# Patient Record
Sex: Female | Born: 1935
Health system: Southern US, Community
[De-identification: ages and names within clinical notes are randomized; demographics above are authoritative.]

## PROBLEM LIST (undated history)

## (undated) DIAGNOSIS — T7840XA Allergy, unspecified, initial encounter: Secondary | ICD-10-CM

## (undated) DIAGNOSIS — R109 Unspecified abdominal pain: Secondary | ICD-10-CM

## (undated) DIAGNOSIS — R0600 Dyspnea, unspecified: Secondary | ICD-10-CM

## (undated) DIAGNOSIS — J019 Acute sinusitis, unspecified: Secondary | ICD-10-CM

## (undated) DIAGNOSIS — C4491 Basal cell carcinoma of skin, unspecified: Secondary | ICD-10-CM

## (undated) DIAGNOSIS — M1990 Unspecified osteoarthritis, unspecified site: Secondary | ICD-10-CM

## (undated) DIAGNOSIS — I499 Cardiac arrhythmia, unspecified: Secondary | ICD-10-CM

## (undated) DIAGNOSIS — J45909 Unspecified asthma, uncomplicated: Secondary | ICD-10-CM

## (undated) DIAGNOSIS — I509 Heart failure, unspecified: Secondary | ICD-10-CM

## (undated) DIAGNOSIS — D689 Coagulation defect, unspecified: Secondary | ICD-10-CM

## (undated) DIAGNOSIS — R0609 Other forms of dyspnea: Secondary | ICD-10-CM

## (undated) DIAGNOSIS — L570 Actinic keratosis: Secondary | ICD-10-CM

## (undated) DIAGNOSIS — G8929 Other chronic pain: Secondary | ICD-10-CM

## (undated) DIAGNOSIS — J449 Chronic obstructive pulmonary disease, unspecified: Secondary | ICD-10-CM

## (undated) DIAGNOSIS — R002 Palpitations: Secondary | ICD-10-CM

## (undated) DIAGNOSIS — D649 Anemia, unspecified: Secondary | ICD-10-CM

## (undated) DIAGNOSIS — J00 Acute nasopharyngitis [common cold]: Secondary | ICD-10-CM

## (undated) DIAGNOSIS — M47816 Spondylosis without myelopathy or radiculopathy, lumbar region: Secondary | ICD-10-CM

## (undated) DIAGNOSIS — M810 Age-related osteoporosis without current pathological fracture: Secondary | ICD-10-CM

## (undated) DIAGNOSIS — J439 Emphysema, unspecified: Secondary | ICD-10-CM

## (undated) DIAGNOSIS — M546 Pain in thoracic spine: Secondary | ICD-10-CM

## (undated) DIAGNOSIS — G709 Myoneural disorder, unspecified: Secondary | ICD-10-CM

## (undated) DIAGNOSIS — E079 Disorder of thyroid, unspecified: Secondary | ICD-10-CM

## (undated) DIAGNOSIS — R079 Chest pain, unspecified: Secondary | ICD-10-CM

## (undated) DIAGNOSIS — J399 Disease of upper respiratory tract, unspecified: Secondary | ICD-10-CM

## (undated) DIAGNOSIS — K219 Gastro-esophageal reflux disease without esophagitis: Secondary | ICD-10-CM

## (undated) DIAGNOSIS — M7989 Other specified soft tissue disorders: Secondary | ICD-10-CM

## (undated) DIAGNOSIS — Z9289 Personal history of other medical treatment: Secondary | ICD-10-CM

## (undated) HISTORY — DX: Chest pain, unspecified: R07.9

## (undated) HISTORY — PX: COLON SURGERY: SHX602

## (undated) HISTORY — PX: LAPAROSCOPIC COLON RESECTION: SUR791

## (undated) HISTORY — DX: Heart failure, unspecified: I50.9

## (undated) HISTORY — DX: Disease of upper respiratory tract, unspecified: J39.9

## (undated) HISTORY — DX: Acute sinusitis, unspecified: J01.90

## (undated) HISTORY — DX: Unspecified osteoarthritis, unspecified site: M19.90

## (undated) HISTORY — DX: Gastro-esophageal reflux disease without esophagitis: K21.9

## (undated) HISTORY — DX: Emphysema, unspecified: J43.9

## (undated) HISTORY — PX: HERNIA REPAIR: SHX51

## (undated) HISTORY — PX: CHOLECYSTECTOMY: SHX55

## (undated) HISTORY — PX: CATARACT EXTRACTION: SUR2

## (undated) HISTORY — DX: Age-related osteoporosis without current pathological fracture: M81.0

## (undated) HISTORY — DX: Other forms of dyspnea: R06.09

## (undated) HISTORY — PX: EYE SURGERY: SHX253

## (undated) HISTORY — DX: Unspecified abdominal pain: R10.9

## (undated) HISTORY — PX: COLONOSCOPY: SHX174

## (undated) HISTORY — PX: ESOPHAGOGASTRODUODENOSCOPY: SHX1529

## (undated) HISTORY — DX: Pain in thoracic spine: M54.6

## (undated) HISTORY — DX: Allergy, unspecified, initial encounter: T78.40XA

## (undated) HISTORY — DX: Acute nasopharyngitis (common cold): J00

## (undated) HISTORY — DX: Other specified soft tissue disorders: M79.89

## (undated) HISTORY — DX: Personal history of other medical treatment: Z92.89

## (undated) HISTORY — DX: Spondylosis without myelopathy or radiculopathy, lumbar region: M47.816

## (undated) HISTORY — DX: Actinic keratosis: L57.0

## (undated) HISTORY — DX: Coagulation defect, unspecified: D68.9

## (undated) HISTORY — DX: Disorder of thyroid, unspecified: E07.9

## (undated) HISTORY — DX: Cardiac arrhythmia, unspecified: I49.9

## (undated) HISTORY — DX: Anemia, unspecified: D64.9

## (undated) HISTORY — PX: JOINT REPLACEMENT: SHX530

## (undated) HISTORY — PX: FRACTURE SURGERY: SHX138

## (undated) HISTORY — DX: Myoneural disorder, unspecified: G70.9

## (undated) HISTORY — DX: Other chronic pain: G89.29

## (undated) HISTORY — DX: Dyspnea, unspecified: R06.00

## (undated) HISTORY — DX: Basal cell carcinoma of skin, unspecified: C44.91

---

## 1999-11-28 HISTORY — PX: FLEXIBLE SIGMOIDOSCOPY: SHX1649

## 2004-06-28 DIAGNOSIS — Z9289 Personal history of other medical treatment: Secondary | ICD-10-CM

## 2004-06-28 HISTORY — DX: Personal history of other medical treatment: Z92.89

## 2004-07-21 HISTORY — PX: HAND SURGERY: SHX662

## 2004-08-08 ENCOUNTER — Ambulatory Visit: Payer: Self-pay

## 2004-12-23 ENCOUNTER — Ambulatory Visit: Payer: Self-pay | Admitting: Unknown Physician Specialty

## 2005-10-28 ENCOUNTER — Ambulatory Visit: Payer: Self-pay | Admitting: Unknown Physician Specialty

## 2008-11-22 ENCOUNTER — Ambulatory Visit: Payer: Self-pay | Admitting: Surgery

## 2009-01-06 ENCOUNTER — Inpatient Hospital Stay: Payer: Self-pay | Admitting: Surgery

## 2009-04-10 ENCOUNTER — Ambulatory Visit: Payer: Self-pay | Admitting: Internal Medicine

## 2009-08-27 ENCOUNTER — Ambulatory Visit: Payer: Self-pay | Admitting: Internal Medicine

## 2009-11-05 ENCOUNTER — Ambulatory Visit: Payer: Self-pay | Admitting: Unknown Physician Specialty

## 2009-11-20 ENCOUNTER — Ambulatory Visit: Payer: Self-pay | Admitting: Unknown Physician Specialty

## 2011-12-11 DIAGNOSIS — J398 Other specified diseases of upper respiratory tract: Secondary | ICD-10-CM | POA: Diagnosis not present

## 2011-12-11 DIAGNOSIS — J45902 Unspecified asthma with status asthmaticus: Secondary | ICD-10-CM | POA: Diagnosis not present

## 2011-12-18 DIAGNOSIS — R42 Dizziness and giddiness: Secondary | ICD-10-CM | POA: Diagnosis not present

## 2011-12-18 DIAGNOSIS — E785 Hyperlipidemia, unspecified: Secondary | ICD-10-CM | POA: Diagnosis not present

## 2011-12-18 DIAGNOSIS — I10 Essential (primary) hypertension: Secondary | ICD-10-CM | POA: Diagnosis not present

## 2011-12-18 DIAGNOSIS — N39498 Other specified urinary incontinence: Secondary | ICD-10-CM | POA: Diagnosis not present

## 2012-02-04 DIAGNOSIS — N39498 Other specified urinary incontinence: Secondary | ICD-10-CM | POA: Diagnosis not present

## 2012-02-04 DIAGNOSIS — E039 Hypothyroidism, unspecified: Secondary | ICD-10-CM | POA: Diagnosis not present

## 2012-02-04 DIAGNOSIS — I10 Essential (primary) hypertension: Secondary | ICD-10-CM | POA: Diagnosis not present

## 2012-02-04 DIAGNOSIS — D649 Anemia, unspecified: Secondary | ICD-10-CM | POA: Diagnosis not present

## 2012-02-04 DIAGNOSIS — E7800 Pure hypercholesterolemia, unspecified: Secondary | ICD-10-CM | POA: Diagnosis not present

## 2012-02-04 DIAGNOSIS — E785 Hyperlipidemia, unspecified: Secondary | ICD-10-CM | POA: Diagnosis not present

## 2012-02-04 DIAGNOSIS — R42 Dizziness and giddiness: Secondary | ICD-10-CM | POA: Diagnosis not present

## 2012-02-11 DIAGNOSIS — R011 Cardiac murmur, unspecified: Secondary | ICD-10-CM | POA: Diagnosis not present

## 2012-03-18 DIAGNOSIS — K219 Gastro-esophageal reflux disease without esophagitis: Secondary | ICD-10-CM | POA: Diagnosis not present

## 2012-03-18 DIAGNOSIS — R498 Other voice and resonance disorders: Secondary | ICD-10-CM | POA: Diagnosis not present

## 2012-03-18 DIAGNOSIS — F449 Dissociative and conversion disorder, unspecified: Secondary | ICD-10-CM | POA: Diagnosis not present

## 2012-03-18 DIAGNOSIS — J45909 Unspecified asthma, uncomplicated: Secondary | ICD-10-CM | POA: Diagnosis not present

## 2012-03-18 DIAGNOSIS — J309 Allergic rhinitis, unspecified: Secondary | ICD-10-CM | POA: Diagnosis not present

## 2012-04-29 DIAGNOSIS — F449 Dissociative and conversion disorder, unspecified: Secondary | ICD-10-CM | POA: Diagnosis not present

## 2012-04-29 DIAGNOSIS — J45909 Unspecified asthma, uncomplicated: Secondary | ICD-10-CM | POA: Diagnosis not present

## 2012-04-29 DIAGNOSIS — K219 Gastro-esophageal reflux disease without esophagitis: Secondary | ICD-10-CM | POA: Diagnosis not present

## 2012-04-29 DIAGNOSIS — R498 Other voice and resonance disorders: Secondary | ICD-10-CM | POA: Diagnosis not present

## 2012-05-11 DIAGNOSIS — Z23 Encounter for immunization: Secondary | ICD-10-CM | POA: Diagnosis not present

## 2012-06-07 DIAGNOSIS — E785 Hyperlipidemia, unspecified: Secondary | ICD-10-CM | POA: Diagnosis not present

## 2012-06-07 DIAGNOSIS — K219 Gastro-esophageal reflux disease without esophagitis: Secondary | ICD-10-CM | POA: Diagnosis not present

## 2012-06-07 DIAGNOSIS — N39498 Other specified urinary incontinence: Secondary | ICD-10-CM | POA: Diagnosis not present

## 2012-06-07 DIAGNOSIS — J45902 Unspecified asthma with status asthmaticus: Secondary | ICD-10-CM | POA: Diagnosis not present

## 2012-08-03 DIAGNOSIS — J309 Allergic rhinitis, unspecified: Secondary | ICD-10-CM | POA: Diagnosis not present

## 2012-08-03 DIAGNOSIS — K219 Gastro-esophageal reflux disease without esophagitis: Secondary | ICD-10-CM | POA: Diagnosis not present

## 2012-08-03 DIAGNOSIS — F449 Dissociative and conversion disorder, unspecified: Secondary | ICD-10-CM | POA: Diagnosis not present

## 2012-08-03 DIAGNOSIS — R498 Other voice and resonance disorders: Secondary | ICD-10-CM | POA: Diagnosis not present

## 2012-08-03 DIAGNOSIS — J45909 Unspecified asthma, uncomplicated: Secondary | ICD-10-CM | POA: Diagnosis not present

## 2012-08-09 DIAGNOSIS — K219 Gastro-esophageal reflux disease without esophagitis: Secondary | ICD-10-CM | POA: Diagnosis not present

## 2012-08-09 DIAGNOSIS — I10 Essential (primary) hypertension: Secondary | ICD-10-CM | POA: Diagnosis not present

## 2012-08-09 DIAGNOSIS — E785 Hyperlipidemia, unspecified: Secondary | ICD-10-CM | POA: Diagnosis not present

## 2012-08-09 DIAGNOSIS — J45902 Unspecified asthma with status asthmaticus: Secondary | ICD-10-CM | POA: Diagnosis not present

## 2012-09-07 DIAGNOSIS — R911 Solitary pulmonary nodule: Secondary | ICD-10-CM | POA: Diagnosis not present

## 2012-09-07 DIAGNOSIS — J9819 Other pulmonary collapse: Secondary | ICD-10-CM | POA: Diagnosis not present

## 2012-09-16 DIAGNOSIS — Z1231 Encounter for screening mammogram for malignant neoplasm of breast: Secondary | ICD-10-CM | POA: Diagnosis not present

## 2012-12-06 DIAGNOSIS — J449 Chronic obstructive pulmonary disease, unspecified: Secondary | ICD-10-CM | POA: Diagnosis not present

## 2012-12-06 DIAGNOSIS — E785 Hyperlipidemia, unspecified: Secondary | ICD-10-CM | POA: Diagnosis not present

## 2012-12-06 DIAGNOSIS — K219 Gastro-esophageal reflux disease without esophagitis: Secondary | ICD-10-CM | POA: Diagnosis not present

## 2012-12-06 DIAGNOSIS — N39498 Other specified urinary incontinence: Secondary | ICD-10-CM | POA: Diagnosis not present

## 2013-02-10 DIAGNOSIS — T1490XA Injury, unspecified, initial encounter: Secondary | ICD-10-CM | POA: Diagnosis not present

## 2013-02-10 DIAGNOSIS — X58XXXA Exposure to other specified factors, initial encounter: Secondary | ICD-10-CM | POA: Diagnosis not present

## 2013-03-09 DIAGNOSIS — L02419 Cutaneous abscess of limb, unspecified: Secondary | ICD-10-CM | POA: Diagnosis not present

## 2013-03-09 DIAGNOSIS — Z23 Encounter for immunization: Secondary | ICD-10-CM | POA: Diagnosis not present

## 2013-03-09 DIAGNOSIS — Z789 Other specified health status: Secondary | ICD-10-CM | POA: Diagnosis not present

## 2013-03-14 DIAGNOSIS — J45909 Unspecified asthma, uncomplicated: Secondary | ICD-10-CM | POA: Diagnosis not present

## 2013-03-14 DIAGNOSIS — I10 Essential (primary) hypertension: Secondary | ICD-10-CM | POA: Diagnosis not present

## 2013-03-14 DIAGNOSIS — L0291 Cutaneous abscess, unspecified: Secondary | ICD-10-CM | POA: Diagnosis not present

## 2013-03-14 DIAGNOSIS — K5712 Diverticulitis of small intestine without perforation or abscess without bleeding: Secondary | ICD-10-CM | POA: Diagnosis not present

## 2013-04-06 DIAGNOSIS — T148 Other injury of unspecified body region: Secondary | ICD-10-CM | POA: Diagnosis not present

## 2013-04-06 DIAGNOSIS — Z91038 Other insect allergy status: Secondary | ICD-10-CM | POA: Diagnosis not present

## 2013-04-08 DIAGNOSIS — T148 Other injury of unspecified body region: Secondary | ICD-10-CM | POA: Diagnosis not present

## 2013-04-11 DIAGNOSIS — T148 Other injury of unspecified body region: Secondary | ICD-10-CM | POA: Diagnosis not present

## 2013-04-11 DIAGNOSIS — Z23 Encounter for immunization: Secondary | ICD-10-CM | POA: Diagnosis not present

## 2013-06-09 DIAGNOSIS — L905 Scar conditions and fibrosis of skin: Secondary | ICD-10-CM | POA: Diagnosis not present

## 2013-06-09 DIAGNOSIS — L820 Inflamed seborrheic keratosis: Secondary | ICD-10-CM | POA: Diagnosis not present

## 2013-06-09 DIAGNOSIS — L570 Actinic keratosis: Secondary | ICD-10-CM | POA: Diagnosis not present

## 2013-06-09 DIAGNOSIS — L578 Other skin changes due to chronic exposure to nonionizing radiation: Secondary | ICD-10-CM | POA: Diagnosis not present

## 2013-06-09 DIAGNOSIS — Z85828 Personal history of other malignant neoplasm of skin: Secondary | ICD-10-CM | POA: Diagnosis not present

## 2013-06-09 DIAGNOSIS — L821 Other seborrheic keratosis: Secondary | ICD-10-CM | POA: Diagnosis not present

## 2013-06-13 DIAGNOSIS — I10 Essential (primary) hypertension: Secondary | ICD-10-CM | POA: Diagnosis not present

## 2013-06-13 DIAGNOSIS — J45909 Unspecified asthma, uncomplicated: Secondary | ICD-10-CM | POA: Diagnosis not present

## 2013-06-13 DIAGNOSIS — Z1331 Encounter for screening for depression: Secondary | ICD-10-CM | POA: Diagnosis not present

## 2013-06-13 DIAGNOSIS — E785 Hyperlipidemia, unspecified: Secondary | ICD-10-CM | POA: Diagnosis not present

## 2013-09-26 ENCOUNTER — Ambulatory Visit: Payer: Self-pay | Admitting: Internal Medicine

## 2013-09-26 DIAGNOSIS — M47814 Spondylosis without myelopathy or radiculopathy, thoracic region: Secondary | ICD-10-CM | POA: Diagnosis not present

## 2013-09-26 DIAGNOSIS — K2100 Gastro-esophageal reflux disease with esophagitis, without bleeding: Secondary | ICD-10-CM | POA: Diagnosis not present

## 2013-09-26 DIAGNOSIS — M8448XA Pathological fracture, other site, initial encounter for fracture: Secondary | ICD-10-CM | POA: Diagnosis not present

## 2013-09-26 DIAGNOSIS — I10 Essential (primary) hypertension: Secondary | ICD-10-CM | POA: Diagnosis not present

## 2013-09-26 DIAGNOSIS — IMO0002 Reserved for concepts with insufficient information to code with codable children: Secondary | ICD-10-CM | POA: Diagnosis not present

## 2013-09-28 DIAGNOSIS — M539 Dorsopathy, unspecified: Secondary | ICD-10-CM | POA: Diagnosis not present

## 2013-09-28 DIAGNOSIS — IMO0002 Reserved for concepts with insufficient information to code with codable children: Secondary | ICD-10-CM | POA: Diagnosis not present

## 2013-12-29 DIAGNOSIS — Z789 Other specified health status: Secondary | ICD-10-CM | POA: Diagnosis not present

## 2013-12-29 DIAGNOSIS — K5732 Diverticulitis of large intestine without perforation or abscess without bleeding: Secondary | ICD-10-CM | POA: Diagnosis not present

## 2013-12-29 DIAGNOSIS — K2100 Gastro-esophageal reflux disease with esophagitis, without bleeding: Secondary | ICD-10-CM | POA: Diagnosis not present

## 2013-12-29 DIAGNOSIS — I10 Essential (primary) hypertension: Secondary | ICD-10-CM | POA: Diagnosis not present

## 2014-02-08 DIAGNOSIS — Z961 Presence of intraocular lens: Secondary | ICD-10-CM | POA: Diagnosis not present

## 2014-04-07 DIAGNOSIS — E039 Hypothyroidism, unspecified: Secondary | ICD-10-CM | POA: Diagnosis not present

## 2014-04-07 DIAGNOSIS — D649 Anemia, unspecified: Secondary | ICD-10-CM | POA: Diagnosis not present

## 2014-04-07 DIAGNOSIS — E785 Hyperlipidemia, unspecified: Secondary | ICD-10-CM | POA: Diagnosis not present

## 2014-04-14 DIAGNOSIS — K5732 Diverticulitis of large intestine without perforation or abscess without bleeding: Secondary | ICD-10-CM | POA: Diagnosis not present

## 2014-04-14 DIAGNOSIS — K2100 Gastro-esophageal reflux disease with esophagitis, without bleeding: Secondary | ICD-10-CM | POA: Diagnosis not present

## 2014-04-14 DIAGNOSIS — K5712 Diverticulitis of small intestine without perforation or abscess without bleeding: Secondary | ICD-10-CM | POA: Diagnosis not present

## 2014-04-14 DIAGNOSIS — E785 Hyperlipidemia, unspecified: Secondary | ICD-10-CM | POA: Diagnosis not present

## 2014-04-18 DIAGNOSIS — Z23 Encounter for immunization: Secondary | ICD-10-CM | POA: Diagnosis not present

## 2014-05-09 DIAGNOSIS — H40003 Preglaucoma, unspecified, bilateral: Secondary | ICD-10-CM | POA: Diagnosis not present

## 2014-06-08 DIAGNOSIS — H1851 Endothelial corneal dystrophy: Secondary | ICD-10-CM | POA: Diagnosis not present

## 2014-06-19 DIAGNOSIS — L578 Other skin changes due to chronic exposure to nonionizing radiation: Secondary | ICD-10-CM | POA: Diagnosis not present

## 2014-06-19 DIAGNOSIS — Z85828 Personal history of other malignant neoplasm of skin: Secondary | ICD-10-CM | POA: Diagnosis not present

## 2014-06-19 DIAGNOSIS — Z1283 Encounter for screening for malignant neoplasm of skin: Secondary | ICD-10-CM | POA: Diagnosis not present

## 2014-06-19 DIAGNOSIS — L814 Other melanin hyperpigmentation: Secondary | ICD-10-CM | POA: Diagnosis not present

## 2014-06-19 DIAGNOSIS — D229 Melanocytic nevi, unspecified: Secondary | ICD-10-CM | POA: Diagnosis not present

## 2014-06-19 DIAGNOSIS — L820 Inflamed seborrheic keratosis: Secondary | ICD-10-CM | POA: Diagnosis not present

## 2014-06-19 DIAGNOSIS — L821 Other seborrheic keratosis: Secondary | ICD-10-CM | POA: Diagnosis not present

## 2014-06-19 DIAGNOSIS — I8393 Asymptomatic varicose veins of bilateral lower extremities: Secondary | ICD-10-CM | POA: Diagnosis not present

## 2014-07-07 DIAGNOSIS — K5712 Diverticulitis of small intestine without perforation or abscess without bleeding: Secondary | ICD-10-CM | POA: Diagnosis not present

## 2014-07-07 DIAGNOSIS — K210 Gastro-esophageal reflux disease with esophagitis: Secondary | ICD-10-CM | POA: Diagnosis not present

## 2014-07-07 DIAGNOSIS — J45909 Unspecified asthma, uncomplicated: Secondary | ICD-10-CM | POA: Diagnosis not present

## 2014-07-07 DIAGNOSIS — I10 Essential (primary) hypertension: Secondary | ICD-10-CM | POA: Diagnosis not present

## 2014-08-24 DIAGNOSIS — J00 Acute nasopharyngitis [common cold]: Secondary | ICD-10-CM | POA: Diagnosis not present

## 2014-08-24 DIAGNOSIS — R05 Cough: Secondary | ICD-10-CM | POA: Diagnosis not present

## 2014-09-01 DIAGNOSIS — K5712 Diverticulitis of small intestine without perforation or abscess without bleeding: Secondary | ICD-10-CM | POA: Diagnosis not present

## 2014-09-01 DIAGNOSIS — J00 Acute nasopharyngitis [common cold]: Secondary | ICD-10-CM | POA: Diagnosis not present

## 2014-09-01 DIAGNOSIS — K210 Gastro-esophageal reflux disease with esophagitis: Secondary | ICD-10-CM | POA: Diagnosis not present

## 2014-09-01 DIAGNOSIS — I10 Essential (primary) hypertension: Secondary | ICD-10-CM | POA: Diagnosis not present

## 2014-09-06 DIAGNOSIS — H1851 Endothelial corneal dystrophy: Secondary | ICD-10-CM | POA: Diagnosis not present

## 2014-11-30 DIAGNOSIS — K5712 Diverticulitis of small intestine without perforation or abscess without bleeding: Secondary | ICD-10-CM | POA: Diagnosis not present

## 2014-11-30 DIAGNOSIS — M549 Dorsalgia, unspecified: Secondary | ICD-10-CM | POA: Diagnosis not present

## 2014-11-30 DIAGNOSIS — I10 Essential (primary) hypertension: Secondary | ICD-10-CM | POA: Diagnosis not present

## 2014-11-30 DIAGNOSIS — K219 Gastro-esophageal reflux disease without esophagitis: Secondary | ICD-10-CM | POA: Diagnosis not present

## 2015-01-01 DIAGNOSIS — J45902 Unspecified asthma with status asthmaticus: Secondary | ICD-10-CM | POA: Diagnosis not present

## 2015-01-01 DIAGNOSIS — I10 Essential (primary) hypertension: Secondary | ICD-10-CM | POA: Diagnosis not present

## 2015-01-01 DIAGNOSIS — M549 Dorsalgia, unspecified: Secondary | ICD-10-CM | POA: Diagnosis not present

## 2015-01-01 DIAGNOSIS — K210 Gastro-esophageal reflux disease with esophagitis: Secondary | ICD-10-CM | POA: Diagnosis not present

## 2015-04-06 DIAGNOSIS — M549 Dorsalgia, unspecified: Secondary | ICD-10-CM | POA: Diagnosis not present

## 2015-04-06 DIAGNOSIS — M545 Low back pain: Secondary | ICD-10-CM | POA: Diagnosis not present

## 2015-04-09 DIAGNOSIS — Z23 Encounter for immunization: Secondary | ICD-10-CM | POA: Diagnosis not present

## 2015-04-10 DIAGNOSIS — I10 Essential (primary) hypertension: Secondary | ICD-10-CM | POA: Diagnosis not present

## 2015-04-10 DIAGNOSIS — E784 Other hyperlipidemia: Secondary | ICD-10-CM | POA: Diagnosis not present

## 2015-04-17 DIAGNOSIS — R0981 Nasal congestion: Secondary | ICD-10-CM | POA: Diagnosis not present

## 2015-04-17 DIAGNOSIS — R0982 Postnasal drip: Secondary | ICD-10-CM | POA: Diagnosis not present

## 2015-04-18 DIAGNOSIS — Z961 Presence of intraocular lens: Secondary | ICD-10-CM | POA: Diagnosis not present

## 2015-04-20 DIAGNOSIS — R32 Unspecified urinary incontinence: Secondary | ICD-10-CM | POA: Diagnosis not present

## 2015-04-20 DIAGNOSIS — I10 Essential (primary) hypertension: Secondary | ICD-10-CM | POA: Diagnosis not present

## 2015-04-20 DIAGNOSIS — K210 Gastro-esophageal reflux disease with esophagitis: Secondary | ICD-10-CM | POA: Diagnosis not present

## 2015-04-20 DIAGNOSIS — Z0000 Encounter for general adult medical examination without abnormal findings: Secondary | ICD-10-CM | POA: Diagnosis not present

## 2015-05-15 DIAGNOSIS — M85862 Other specified disorders of bone density and structure, left lower leg: Secondary | ICD-10-CM | POA: Diagnosis not present

## 2015-05-15 DIAGNOSIS — Z1231 Encounter for screening mammogram for malignant neoplasm of breast: Secondary | ICD-10-CM | POA: Diagnosis not present

## 2015-05-15 DIAGNOSIS — Z13820 Encounter for screening for osteoporosis: Secondary | ICD-10-CM | POA: Diagnosis not present

## 2015-05-15 DIAGNOSIS — E2839 Other primary ovarian failure: Secondary | ICD-10-CM | POA: Diagnosis not present

## 2015-06-20 DIAGNOSIS — Z1283 Encounter for screening for malignant neoplasm of skin: Secondary | ICD-10-CM | POA: Diagnosis not present

## 2015-06-20 DIAGNOSIS — L821 Other seborrheic keratosis: Secondary | ICD-10-CM | POA: Diagnosis not present

## 2015-06-20 DIAGNOSIS — L570 Actinic keratosis: Secondary | ICD-10-CM | POA: Diagnosis not present

## 2015-06-20 DIAGNOSIS — D1800 Hemangioma unspecified site: Secondary | ICD-10-CM | POA: Diagnosis not present

## 2015-06-20 DIAGNOSIS — D692 Other nonthrombocytopenic purpura: Secondary | ICD-10-CM | POA: Diagnosis not present

## 2015-06-20 DIAGNOSIS — I8393 Asymptomatic varicose veins of bilateral lower extremities: Secondary | ICD-10-CM | POA: Diagnosis not present

## 2015-06-20 DIAGNOSIS — L718 Other rosacea: Secondary | ICD-10-CM | POA: Diagnosis not present

## 2015-06-20 DIAGNOSIS — L578 Other skin changes due to chronic exposure to nonionizing radiation: Secondary | ICD-10-CM | POA: Diagnosis not present

## 2015-06-20 DIAGNOSIS — I781 Nevus, non-neoplastic: Secondary | ICD-10-CM | POA: Diagnosis not present

## 2015-06-20 DIAGNOSIS — D229 Melanocytic nevi, unspecified: Secondary | ICD-10-CM | POA: Diagnosis not present

## 2015-06-20 DIAGNOSIS — T07 Unspecified multiple injuries: Secondary | ICD-10-CM | POA: Diagnosis not present

## 2015-06-20 DIAGNOSIS — Z85828 Personal history of other malignant neoplasm of skin: Secondary | ICD-10-CM | POA: Diagnosis not present

## 2015-07-25 DIAGNOSIS — M810 Age-related osteoporosis without current pathological fracture: Secondary | ICD-10-CM | POA: Diagnosis not present

## 2015-09-13 DIAGNOSIS — J449 Chronic obstructive pulmonary disease, unspecified: Secondary | ICD-10-CM | POA: Diagnosis not present

## 2015-10-11 DIAGNOSIS — J449 Chronic obstructive pulmonary disease, unspecified: Secondary | ICD-10-CM | POA: Diagnosis not present

## 2015-10-16 DIAGNOSIS — H1851 Endothelial corneal dystrophy: Secondary | ICD-10-CM | POA: Diagnosis not present

## 2015-10-18 IMAGING — CR DG THORACIC SPINE 2-3V
1 series · 3 of 3 positions shown · non-contrast
Comparison: None.

CLINICAL DATA: Back pain

EXAM:
THORACIC SPINE - 2 VIEW

[Series 1: ap · 0.17mm/px · 3 of 3 slices shown]
[im 1/3]
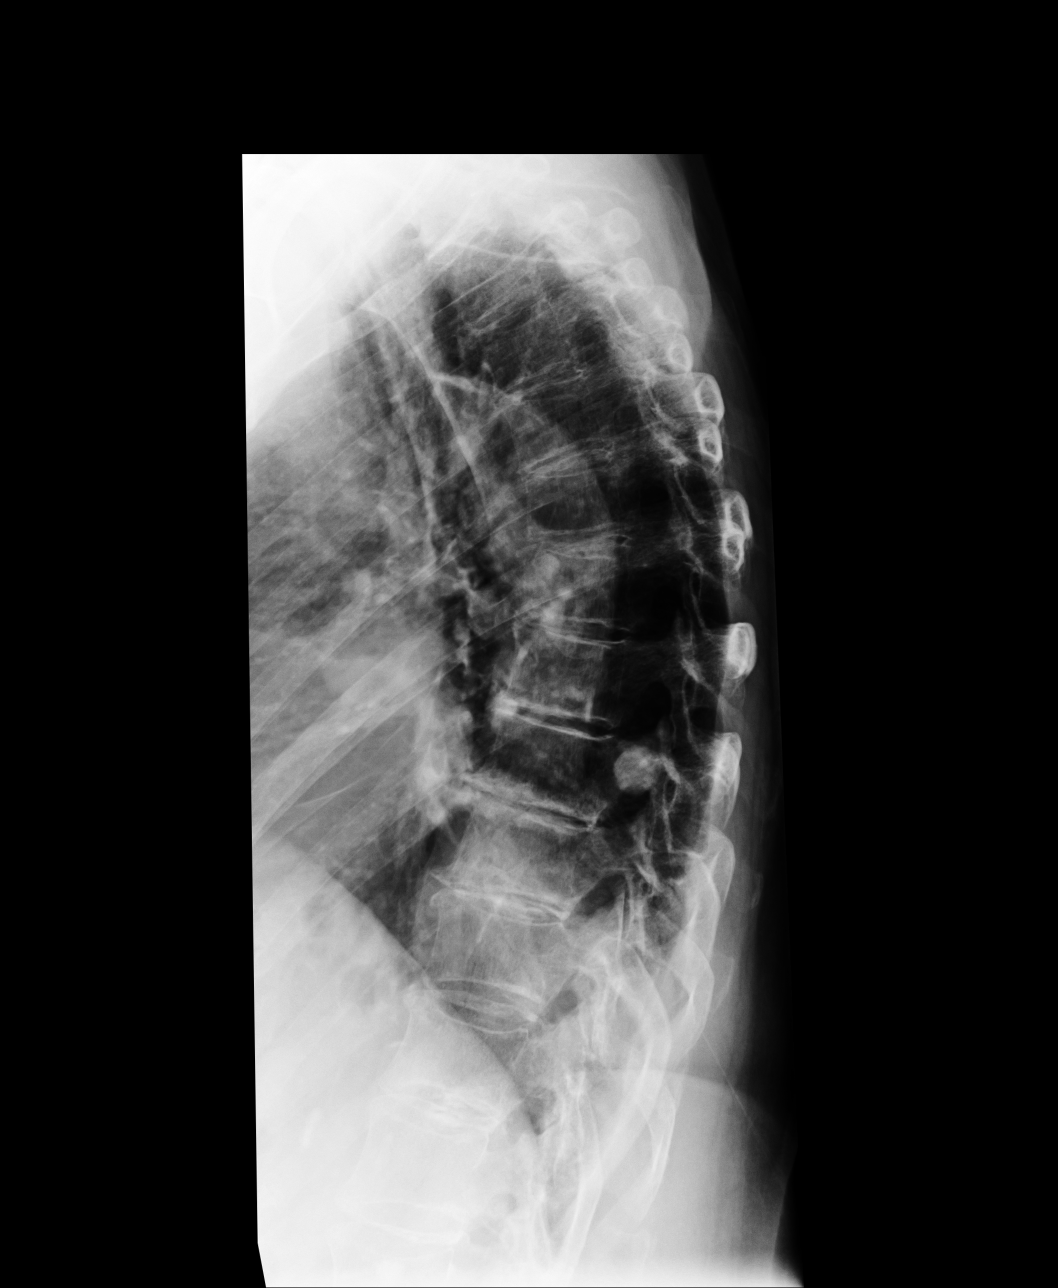
[im 2/3]
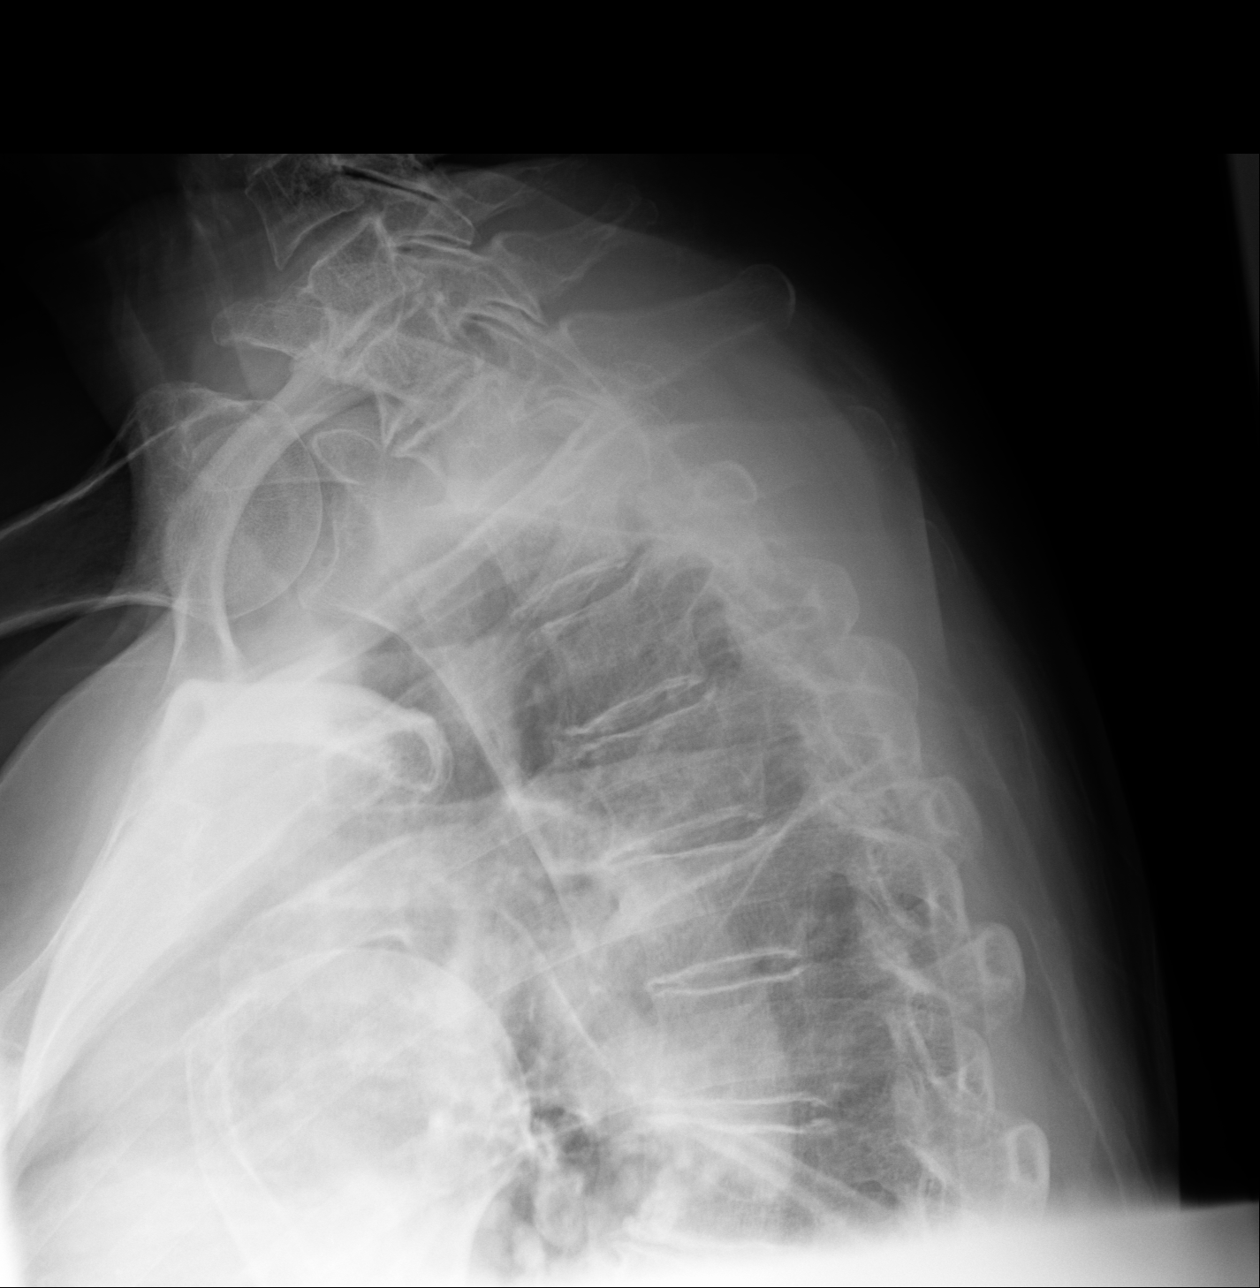
[im 3/3]
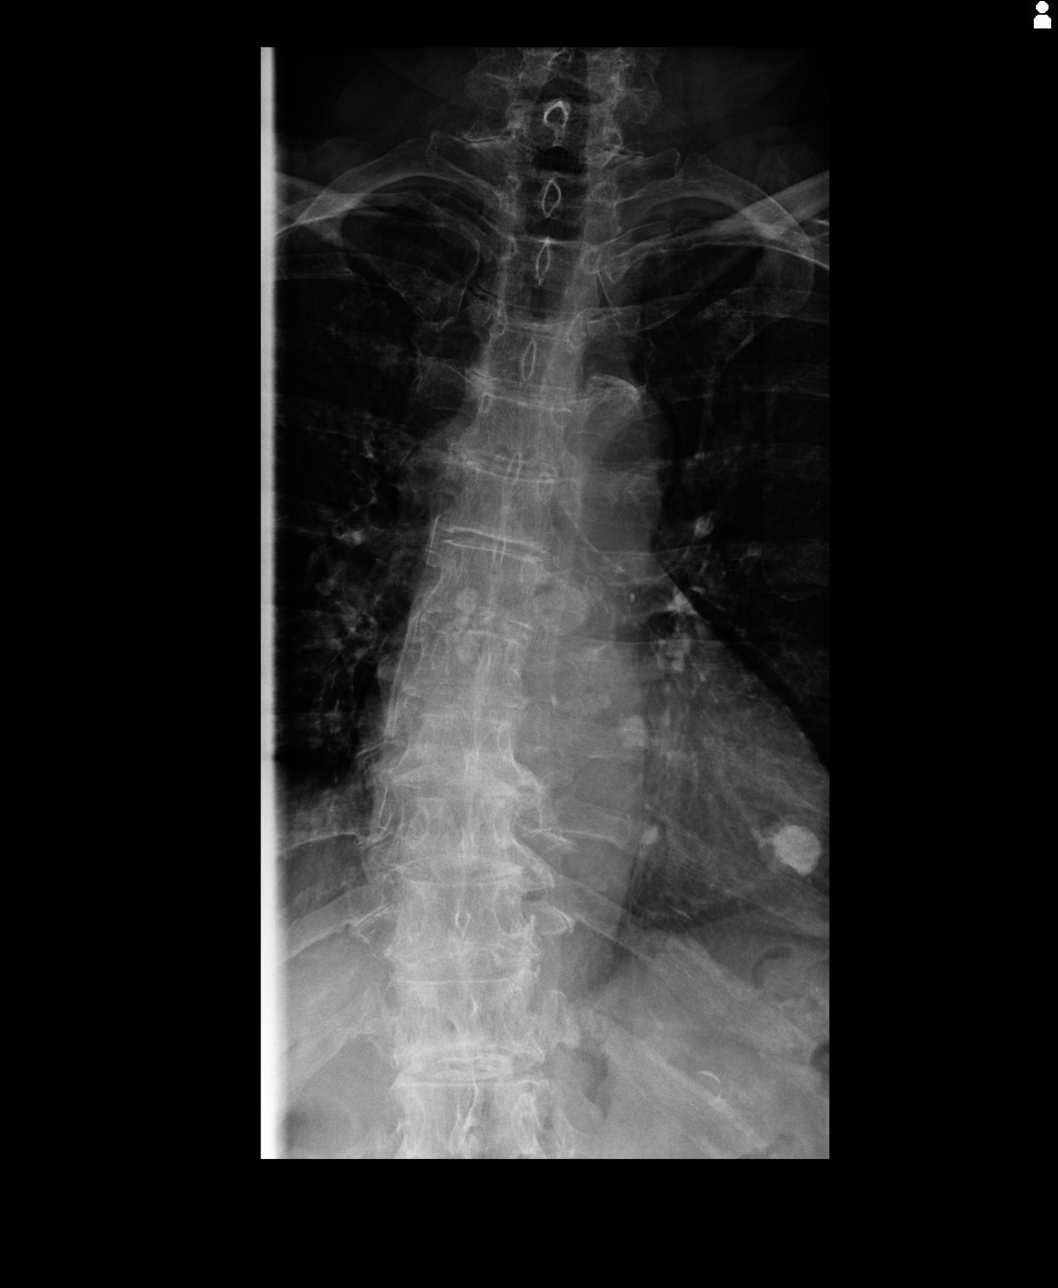

[3 of 3 positions shown; findings below may reference images not displayed]

FINDINGS: Degenerative changes are noted. No compression deformities are seen.
Multiple calcified granulomas are noted.
IMPRESSION: Degenerative change without acute abnormality.

## 2015-10-26 DIAGNOSIS — M810 Age-related osteoporosis without current pathological fracture: Secondary | ICD-10-CM | POA: Diagnosis not present

## 2015-10-26 DIAGNOSIS — K210 Gastro-esophageal reflux disease with esophagitis: Secondary | ICD-10-CM | POA: Diagnosis not present

## 2015-10-26 DIAGNOSIS — M4696 Unspecified inflammatory spondylopathy, lumbar region: Secondary | ICD-10-CM | POA: Diagnosis not present

## 2015-10-26 DIAGNOSIS — I10 Essential (primary) hypertension: Secondary | ICD-10-CM | POA: Diagnosis not present

## 2015-11-11 DIAGNOSIS — J449 Chronic obstructive pulmonary disease, unspecified: Secondary | ICD-10-CM | POA: Diagnosis not present

## 2015-12-11 DIAGNOSIS — J449 Chronic obstructive pulmonary disease, unspecified: Secondary | ICD-10-CM | POA: Diagnosis not present

## 2016-01-11 DIAGNOSIS — J449 Chronic obstructive pulmonary disease, unspecified: Secondary | ICD-10-CM | POA: Diagnosis not present

## 2016-01-18 DIAGNOSIS — I10 Essential (primary) hypertension: Secondary | ICD-10-CM | POA: Diagnosis not present

## 2016-01-18 DIAGNOSIS — M810 Age-related osteoporosis without current pathological fracture: Secondary | ICD-10-CM | POA: Diagnosis not present

## 2016-01-18 DIAGNOSIS — M4696 Unspecified inflammatory spondylopathy, lumbar region: Secondary | ICD-10-CM | POA: Diagnosis not present

## 2016-01-18 DIAGNOSIS — K210 Gastro-esophageal reflux disease with esophagitis: Secondary | ICD-10-CM | POA: Diagnosis not present

## 2016-02-10 DIAGNOSIS — J449 Chronic obstructive pulmonary disease, unspecified: Secondary | ICD-10-CM | POA: Diagnosis not present

## 2016-03-12 DIAGNOSIS — J449 Chronic obstructive pulmonary disease, unspecified: Secondary | ICD-10-CM | POA: Diagnosis not present

## 2016-04-15 DIAGNOSIS — H1851 Endothelial corneal dystrophy: Secondary | ICD-10-CM | POA: Diagnosis not present

## 2016-04-18 DIAGNOSIS — M4696 Unspecified inflammatory spondylopathy, lumbar region: Secondary | ICD-10-CM | POA: Diagnosis not present

## 2016-04-18 DIAGNOSIS — K210 Gastro-esophageal reflux disease with esophagitis: Secondary | ICD-10-CM | POA: Diagnosis not present

## 2016-04-18 DIAGNOSIS — Z23 Encounter for immunization: Secondary | ICD-10-CM | POA: Diagnosis not present

## 2016-04-18 DIAGNOSIS — I10 Essential (primary) hypertension: Secondary | ICD-10-CM | POA: Diagnosis not present

## 2016-04-18 DIAGNOSIS — K5732 Diverticulitis of large intestine without perforation or abscess without bleeding: Secondary | ICD-10-CM | POA: Diagnosis not present

## 2016-05-16 DIAGNOSIS — K5732 Diverticulitis of large intestine without perforation or abscess without bleeding: Secondary | ICD-10-CM | POA: Diagnosis not present

## 2016-05-16 DIAGNOSIS — I10 Essential (primary) hypertension: Secondary | ICD-10-CM | POA: Diagnosis not present

## 2016-05-16 DIAGNOSIS — M4696 Unspecified inflammatory spondylopathy, lumbar region: Secondary | ICD-10-CM | POA: Diagnosis not present

## 2016-05-16 DIAGNOSIS — M1990 Unspecified osteoarthritis, unspecified site: Secondary | ICD-10-CM | POA: Diagnosis not present

## 2016-06-19 DIAGNOSIS — Z1283 Encounter for screening for malignant neoplasm of skin: Secondary | ICD-10-CM | POA: Diagnosis not present

## 2016-06-19 DIAGNOSIS — I8393 Asymptomatic varicose veins of bilateral lower extremities: Secondary | ICD-10-CM | POA: Diagnosis not present

## 2016-06-19 DIAGNOSIS — D229 Melanocytic nevi, unspecified: Secondary | ICD-10-CM | POA: Diagnosis not present

## 2016-06-19 DIAGNOSIS — Z85828 Personal history of other malignant neoplasm of skin: Secondary | ICD-10-CM | POA: Diagnosis not present

## 2016-06-19 DIAGNOSIS — D1800 Hemangioma unspecified site: Secondary | ICD-10-CM | POA: Diagnosis not present

## 2016-06-19 DIAGNOSIS — D692 Other nonthrombocytopenic purpura: Secondary | ICD-10-CM | POA: Diagnosis not present

## 2016-06-19 DIAGNOSIS — L821 Other seborrheic keratosis: Secondary | ICD-10-CM | POA: Diagnosis not present

## 2016-06-19 DIAGNOSIS — L578 Other skin changes due to chronic exposure to nonionizing radiation: Secondary | ICD-10-CM | POA: Diagnosis not present

## 2016-06-19 DIAGNOSIS — L812 Freckles: Secondary | ICD-10-CM | POA: Diagnosis not present

## 2016-06-27 ENCOUNTER — Other Ambulatory Visit
Admission: RE | Admit: 2016-06-27 | Discharge: 2016-06-27 | Disposition: A | Discharge status: Home / Self Care | Payer: PPO | Source: Ambulatory Visit | Attending: Internal Medicine | Admitting: Internal Medicine

## 2016-06-27 DIAGNOSIS — E784 Other hyperlipidemia: Secondary | ICD-10-CM | POA: Insufficient documentation

## 2016-06-27 DIAGNOSIS — M810 Age-related osteoporosis without current pathological fracture: Secondary | ICD-10-CM | POA: Diagnosis not present

## 2016-06-27 DIAGNOSIS — I10 Essential (primary) hypertension: Secondary | ICD-10-CM | POA: Diagnosis not present

## 2016-06-27 DIAGNOSIS — R079 Chest pain, unspecified: Secondary | ICD-10-CM | POA: Insufficient documentation

## 2016-06-27 DIAGNOSIS — E669 Obesity, unspecified: Secondary | ICD-10-CM | POA: Insufficient documentation

## 2016-06-27 LAB — TSH: TSH: 3.218 u[IU]/mL (ref 0.350–4.500)

## 2016-06-27 LAB — CBC
HCT: 42.2 % (ref 35.0–47.0)
Hemoglobin: 14.4 g/dL (ref 12.0–16.0)
MCH: 32.4 pg (ref 26.0–34.0)
MCHC: 34.2 g/dL (ref 32.0–36.0)
MCV: 94.8 fL (ref 80.0–100.0)
Platelets: 296 10*3/uL (ref 150–440)
RBC: 4.45 MIL/uL (ref 3.80–5.20)
RDW: 13.6 % (ref 11.5–14.5)
WBC: 8 10*3/uL (ref 3.6–11.0)

## 2016-06-27 LAB — COMPREHENSIVE METABOLIC PANEL
ALT: 17 U/L (ref 14–54)
AST: 18 U/L (ref 15–41)
Albumin: 4.1 g/dL (ref 3.5–5.0)
Alkaline Phosphatase: 48 U/L (ref 38–126)
Anion gap: 8 (ref 5–15)
BUN: 14 mg/dL (ref 6–20)
CO2: 32 mmol/L (ref 22–32)
Calcium: 9.7 mg/dL (ref 8.9–10.3)
Chloride: 102 mmol/L (ref 101–111)
Creatinine, Ser: 0.81 mg/dL (ref 0.44–1.00)
GFR calc Af Amer: >60 mL/min (ref >60)
GFR calc non Af Amer: >60 mL/min (ref >60)
Glucose, Bld: 103 mg/dL — ABNORMAL HIGH (ref 65–99)
Potassium: 4.4 mmol/L (ref 3.5–5.1)
Sodium: 142 mmol/L (ref 135–145)
Total Bilirubin: 1 mg/dL (ref 0.3–1.2)
Total Protein: 7.2 g/dL (ref 6.5–8.1)

## 2016-06-27 LAB — LIPID PANEL
Cholesterol: 170 mg/dL (ref 0–200)
HDL: 66 mg/dL (ref >40)
LDL Cholesterol: 79 mg/dL (ref 0–99)
Total CHOL/HDL Ratio: 2.6 RATIO
Triglycerides: 123 mg/dL (ref <150)
VLDL: 25 mg/dL (ref 0–40)

## 2016-06-28 LAB — VITAMIN D 25 HYDROXY (VIT D DEFICIENCY, FRACTURES): Vit D, 25-Hydroxy: 56.8 ng/mL (ref 30.0–100.0)

## 2016-07-04 DIAGNOSIS — K5732 Diverticulitis of large intestine without perforation or abscess without bleeding: Secondary | ICD-10-CM | POA: Diagnosis not present

## 2016-07-04 DIAGNOSIS — I517 Cardiomegaly: Secondary | ICD-10-CM | POA: Diagnosis not present

## 2016-07-04 DIAGNOSIS — K210 Gastro-esophageal reflux disease with esophagitis: Secondary | ICD-10-CM | POA: Diagnosis not present

## 2016-07-04 DIAGNOSIS — R9431 Abnormal electrocardiogram [ECG] [EKG]: Secondary | ICD-10-CM | POA: Diagnosis not present

## 2016-07-07 DIAGNOSIS — I517 Cardiomegaly: Secondary | ICD-10-CM | POA: Diagnosis not present

## 2016-07-07 DIAGNOSIS — R011 Cardiac murmur, unspecified: Secondary | ICD-10-CM | POA: Diagnosis not present

## 2016-07-31 DIAGNOSIS — K210 Gastro-esophageal reflux disease with esophagitis: Secondary | ICD-10-CM | POA: Diagnosis not present

## 2016-07-31 DIAGNOSIS — I714 Abdominal aortic aneurysm, without rupture: Secondary | ICD-10-CM | POA: Diagnosis not present

## 2016-07-31 DIAGNOSIS — M4696 Unspecified inflammatory spondylopathy, lumbar region: Secondary | ICD-10-CM | POA: Diagnosis not present

## 2016-07-31 DIAGNOSIS — J45902 Unspecified asthma with status asthmaticus: Secondary | ICD-10-CM | POA: Diagnosis not present

## 2016-10-13 DIAGNOSIS — H1851 Endothelial corneal dystrophy: Secondary | ICD-10-CM | POA: Diagnosis not present

## 2016-10-30 DIAGNOSIS — J45902 Unspecified asthma with status asthmaticus: Secondary | ICD-10-CM | POA: Diagnosis not present

## 2016-10-30 DIAGNOSIS — I517 Cardiomegaly: Secondary | ICD-10-CM | POA: Diagnosis not present

## 2016-10-30 DIAGNOSIS — K210 Gastro-esophageal reflux disease with esophagitis: Secondary | ICD-10-CM | POA: Diagnosis not present

## 2016-10-30 DIAGNOSIS — R011 Cardiac murmur, unspecified: Secondary | ICD-10-CM | POA: Diagnosis not present

## 2017-02-06 DIAGNOSIS — R635 Abnormal weight gain: Secondary | ICD-10-CM | POA: Diagnosis not present

## 2017-04-13 DIAGNOSIS — H1851 Endothelial corneal dystrophy: Secondary | ICD-10-CM | POA: Diagnosis not present

## 2017-04-30 DIAGNOSIS — K2100 Gastro-esophageal reflux disease with esophagitis, without bleeding: Secondary | ICD-10-CM | POA: Insufficient documentation

## 2017-04-30 DIAGNOSIS — J00 Acute nasopharyngitis [common cold]: Secondary | ICD-10-CM

## 2017-04-30 DIAGNOSIS — M1990 Unspecified osteoarthritis, unspecified site: Secondary | ICD-10-CM

## 2017-04-30 DIAGNOSIS — J399 Disease of upper respiratory tract, unspecified: Secondary | ICD-10-CM

## 2017-04-30 DIAGNOSIS — J019 Acute sinusitis, unspecified: Secondary | ICD-10-CM

## 2017-04-30 DIAGNOSIS — R011 Cardiac murmur, unspecified: Secondary | ICD-10-CM | POA: Insufficient documentation

## 2017-04-30 DIAGNOSIS — M810 Age-related osteoporosis without current pathological fracture: Secondary | ICD-10-CM

## 2017-04-30 DIAGNOSIS — M47816 Spondylosis without myelopathy or radiculopathy, lumbar region: Secondary | ICD-10-CM | POA: Insufficient documentation

## 2017-04-30 DIAGNOSIS — K5710 Diverticulosis of small intestine without perforation or abscess without bleeding: Secondary | ICD-10-CM | POA: Insufficient documentation

## 2017-04-30 DIAGNOSIS — J45909 Unspecified asthma, uncomplicated: Secondary | ICD-10-CM | POA: Insufficient documentation

## 2017-04-30 DIAGNOSIS — R109 Unspecified abdominal pain: Secondary | ICD-10-CM | POA: Diagnosis not present

## 2017-04-30 DIAGNOSIS — Z8679 Personal history of other diseases of the circulatory system: Secondary | ICD-10-CM | POA: Insufficient documentation

## 2017-04-30 DIAGNOSIS — G8929 Other chronic pain: Secondary | ICD-10-CM | POA: Diagnosis not present

## 2017-04-30 DIAGNOSIS — R079 Chest pain, unspecified: Secondary | ICD-10-CM | POA: Insufficient documentation

## 2017-04-30 DIAGNOSIS — Z86010 Personal history of colonic polyps: Secondary | ICD-10-CM | POA: Insufficient documentation

## 2017-04-30 DIAGNOSIS — K219 Gastro-esophageal reflux disease without esophagitis: Secondary | ICD-10-CM

## 2017-04-30 DIAGNOSIS — R0609 Other forms of dyspnea: Secondary | ICD-10-CM | POA: Insufficient documentation

## 2017-04-30 DIAGNOSIS — E785 Hyperlipidemia, unspecified: Secondary | ICD-10-CM | POA: Insufficient documentation

## 2017-04-30 DIAGNOSIS — M7989 Other specified soft tissue disorders: Secondary | ICD-10-CM | POA: Insufficient documentation

## 2017-04-30 DIAGNOSIS — I509 Heart failure, unspecified: Secondary | ICD-10-CM

## 2017-04-30 HISTORY — DX: Age-related osteoporosis without current pathological fracture: M81.0

## 2017-04-30 HISTORY — DX: Acute sinusitis, unspecified: J01.90

## 2017-04-30 HISTORY — DX: Unspecified osteoarthritis, unspecified site: M19.90

## 2017-04-30 HISTORY — DX: Acute nasopharyngitis (common cold): J00

## 2017-04-30 HISTORY — DX: Heart failure, unspecified: I50.9

## 2017-04-30 HISTORY — DX: Disease of upper respiratory tract, unspecified: J39.9

## 2017-04-30 HISTORY — DX: Gastro-esophageal reflux disease without esophagitis: K21.9

## 2017-05-01 DIAGNOSIS — G8929 Other chronic pain: Secondary | ICD-10-CM

## 2017-05-01 HISTORY — DX: Other chronic pain: G89.29

## 2017-05-06 DIAGNOSIS — Z86010 Personal history of colonic polyps: Secondary | ICD-10-CM | POA: Diagnosis not present

## 2017-06-05 DIAGNOSIS — R42 Dizziness and giddiness: Secondary | ICD-10-CM | POA: Diagnosis not present

## 2017-06-05 DIAGNOSIS — K210 Gastro-esophageal reflux disease with esophagitis: Secondary | ICD-10-CM | POA: Diagnosis not present

## 2017-06-05 DIAGNOSIS — M4696 Unspecified inflammatory spondylopathy, lumbar region: Secondary | ICD-10-CM | POA: Diagnosis not present

## 2017-06-05 DIAGNOSIS — I517 Cardiomegaly: Secondary | ICD-10-CM | POA: Diagnosis not present

## 2017-06-19 DIAGNOSIS — M810 Age-related osteoporosis without current pathological fracture: Secondary | ICD-10-CM | POA: Diagnosis not present

## 2017-06-19 DIAGNOSIS — K5732 Diverticulitis of large intestine without perforation or abscess without bleeding: Secondary | ICD-10-CM | POA: Diagnosis not present

## 2017-06-19 DIAGNOSIS — R079 Chest pain, unspecified: Secondary | ICD-10-CM | POA: Diagnosis not present

## 2017-06-19 DIAGNOSIS — M47816 Spondylosis without myelopathy or radiculopathy, lumbar region: Secondary | ICD-10-CM | POA: Diagnosis not present

## 2017-06-25 DIAGNOSIS — Z85828 Personal history of other malignant neoplasm of skin: Secondary | ICD-10-CM | POA: Diagnosis not present

## 2017-06-25 DIAGNOSIS — D1800 Hemangioma unspecified site: Secondary | ICD-10-CM | POA: Diagnosis not present

## 2017-06-25 DIAGNOSIS — L812 Freckles: Secondary | ICD-10-CM | POA: Diagnosis not present

## 2017-06-25 DIAGNOSIS — D229 Melanocytic nevi, unspecified: Secondary | ICD-10-CM | POA: Diagnosis not present

## 2017-06-25 DIAGNOSIS — D692 Other nonthrombocytopenic purpura: Secondary | ICD-10-CM | POA: Diagnosis not present

## 2017-06-25 DIAGNOSIS — L821 Other seborrheic keratosis: Secondary | ICD-10-CM | POA: Diagnosis not present

## 2017-06-25 DIAGNOSIS — L820 Inflamed seborrheic keratosis: Secondary | ICD-10-CM | POA: Diagnosis not present

## 2017-06-25 DIAGNOSIS — Z1283 Encounter for screening for malignant neoplasm of skin: Secondary | ICD-10-CM | POA: Diagnosis not present

## 2017-06-25 DIAGNOSIS — L578 Other skin changes due to chronic exposure to nonionizing radiation: Secondary | ICD-10-CM | POA: Diagnosis not present

## 2017-06-25 DIAGNOSIS — D485 Neoplasm of uncertain behavior of skin: Secondary | ICD-10-CM | POA: Diagnosis not present

## 2017-06-25 DIAGNOSIS — L570 Actinic keratosis: Secondary | ICD-10-CM | POA: Diagnosis not present

## 2017-07-28 DIAGNOSIS — C4492 Squamous cell carcinoma of skin, unspecified: Secondary | ICD-10-CM

## 2017-07-28 DIAGNOSIS — D0471 Carcinoma in situ of skin of right lower limb, including hip: Secondary | ICD-10-CM | POA: Diagnosis not present

## 2017-07-28 HISTORY — DX: Squamous cell carcinoma of skin, unspecified: C44.92

## 2017-07-29 ENCOUNTER — Ambulatory Visit: Admit: 2017-07-29 | Payer: PPO | Admitting: Unknown Physician Specialty

## 2017-07-29 ENCOUNTER — Ambulatory Visit: Admission: RE | Admit: 2017-07-29 | Payer: PPO | Source: Ambulatory Visit | Admitting: Unknown Physician Specialty

## 2017-07-29 ENCOUNTER — Encounter: Admission: RE | Payer: Self-pay | Source: Ambulatory Visit

## 2017-07-29 SURGERY — COLONOSCOPY WITH PROPOFOL
Anesthesia: General

## 2017-09-17 DIAGNOSIS — I517 Cardiomegaly: Secondary | ICD-10-CM | POA: Diagnosis not present

## 2017-09-17 DIAGNOSIS — R42 Dizziness and giddiness: Secondary | ICD-10-CM | POA: Diagnosis not present

## 2017-09-17 DIAGNOSIS — M47816 Spondylosis without myelopathy or radiculopathy, lumbar region: Secondary | ICD-10-CM | POA: Diagnosis not present

## 2017-09-17 DIAGNOSIS — K210 Gastro-esophageal reflux disease with esophagitis: Secondary | ICD-10-CM | POA: Diagnosis not present

## 2017-10-12 DIAGNOSIS — H1851 Endothelial corneal dystrophy: Secondary | ICD-10-CM | POA: Diagnosis not present

## 2017-10-13 DIAGNOSIS — K210 Gastro-esophageal reflux disease with esophagitis: Secondary | ICD-10-CM | POA: Diagnosis not present

## 2017-10-13 DIAGNOSIS — M47816 Spondylosis without myelopathy or radiculopathy, lumbar region: Secondary | ICD-10-CM | POA: Diagnosis not present

## 2017-10-13 DIAGNOSIS — K5732 Diverticulitis of large intestine without perforation or abscess without bleeding: Secondary | ICD-10-CM | POA: Diagnosis not present

## 2017-10-13 DIAGNOSIS — I517 Cardiomegaly: Secondary | ICD-10-CM | POA: Diagnosis not present

## 2017-10-27 DIAGNOSIS — H1851 Endothelial corneal dystrophy: Secondary | ICD-10-CM | POA: Diagnosis not present

## 2018-01-05 DIAGNOSIS — K210 Gastro-esophageal reflux disease with esophagitis: Secondary | ICD-10-CM | POA: Diagnosis not present

## 2018-01-05 DIAGNOSIS — I517 Cardiomegaly: Secondary | ICD-10-CM | POA: Diagnosis not present

## 2018-01-05 DIAGNOSIS — H189 Unspecified disorder of cornea: Secondary | ICD-10-CM | POA: Diagnosis not present

## 2018-01-05 DIAGNOSIS — M47816 Spondylosis without myelopathy or radiculopathy, lumbar region: Secondary | ICD-10-CM | POA: Diagnosis not present

## 2018-01-06 DIAGNOSIS — L578 Other skin changes due to chronic exposure to nonionizing radiation: Secondary | ICD-10-CM | POA: Diagnosis not present

## 2018-01-06 DIAGNOSIS — L570 Actinic keratosis: Secondary | ICD-10-CM | POA: Diagnosis not present

## 2018-01-06 DIAGNOSIS — D485 Neoplasm of uncertain behavior of skin: Secondary | ICD-10-CM | POA: Diagnosis not present

## 2018-01-06 DIAGNOSIS — Z85828 Personal history of other malignant neoplasm of skin: Secondary | ICD-10-CM | POA: Diagnosis not present

## 2018-01-11 ENCOUNTER — Other Ambulatory Visit: Payer: Self-pay

## 2018-01-11 NOTE — Patient Outreach (Signed)
[facility]) Care Management  01/11/2018  WHITNEY HILLEGASS 11-Jan-1936 [id number]   TELEPHONE SCREENING Referral date: 01/05/18 Referral source: HTA concierge Referral reason: cannot afford medication Insurance: Health team advantage  Telephone call to patient regarding HTA concierge referral.  HIPAA verified.. Explained reason for call. States her primary MD increased her Breo dose and put her on Advair.   Patient states she has less than $500 left before she is in the donut hole. States she is unable to afford her advair.  Patient states the advair does come in generic form but her insurance does not cover the generic.   Patient states she has had  trouble with shortness of breath at night and  reports she has had COPD for approximately 2 years.   RNCM discussed and offered [facility] care management  Services. Patient verbally agreed.    PLAN: RNCM will refer patient to health coach and pharmacy  Quinn Plowman RN,BSN,CCM [facility] Telephonic  [phone]

## 2018-01-12 ENCOUNTER — Encounter: Payer: Self-pay | Admitting: *Deleted

## 2018-01-14 DIAGNOSIS — L570 Actinic keratosis: Secondary | ICD-10-CM | POA: Diagnosis not present

## 2018-01-15 ENCOUNTER — Other Ambulatory Visit: Payer: Self-pay | Admitting: Pharmacist

## 2018-01-15 NOTE — Patient Outreach (Signed)
[name]) Care Management  01/15/2018  Teresa Chambers April 29, 1936 [id number]    82 y.o. year old female referred to [place] for Medication Assistance (Advair)   Was unable to reach patient via telephone today and have left HIPAA compliant voicemail asking patient to return my call. (unsuccessful outreach #1).  Plan: Will follow-up within 2-4  business days via telephone.  Will send unsuccessful outreach letter per workflow and allow patient 10 days to respond prior to closing case.  [name], PharmD Clinical Pharmacist, [place] [facility] [phone]

## 2018-01-19 ENCOUNTER — Other Ambulatory Visit: Payer: Self-pay | Admitting: Pharmacist

## 2018-01-19 ENCOUNTER — Emergency Department: Payer: PPO

## 2018-01-19 ENCOUNTER — Other Ambulatory Visit: Payer: Self-pay | Admitting: *Deleted

## 2018-01-19 ENCOUNTER — Other Ambulatory Visit: Payer: Self-pay

## 2018-01-19 ENCOUNTER — Encounter: Payer: Self-pay | Admitting: Emergency Medicine

## 2018-01-19 ENCOUNTER — Emergency Department
Admission: EM | Admit: 2018-01-19 | Discharge: 2018-01-19 | Disposition: A | Discharge status: Home / Self Care | Payer: PPO | Attending: Emergency Medicine | Admitting: Emergency Medicine

## 2018-01-19 DIAGNOSIS — R0602 Shortness of breath: Secondary | ICD-10-CM | POA: Insufficient documentation

## 2018-01-19 DIAGNOSIS — R609 Edema, unspecified: Secondary | ICD-10-CM | POA: Diagnosis present

## 2018-01-19 DIAGNOSIS — J449 Chronic obstructive pulmonary disease, unspecified: Secondary | ICD-10-CM | POA: Diagnosis not present

## 2018-01-19 DIAGNOSIS — R05 Cough: Secondary | ICD-10-CM | POA: Diagnosis not present

## 2018-01-19 DIAGNOSIS — R079 Chest pain, unspecified: Secondary | ICD-10-CM | POA: Diagnosis not present

## 2018-01-19 DIAGNOSIS — Z79899 Other long term (current) drug therapy: Secondary | ICD-10-CM | POA: Diagnosis not present

## 2018-01-19 DIAGNOSIS — R000 Tachycardia, unspecified: Secondary | ICD-10-CM | POA: Diagnosis not present

## 2018-01-19 DIAGNOSIS — R059 Cough, unspecified: Secondary | ICD-10-CM

## 2018-01-19 HISTORY — DX: Unspecified asthma, uncomplicated: J45.909

## 2018-01-19 HISTORY — DX: Chronic obstructive pulmonary disease, unspecified: J44.9

## 2018-01-19 HISTORY — DX: Heart failure, unspecified: I50.9

## 2018-01-19 HISTORY — DX: Palpitations: R00.2

## 2018-01-19 LAB — BASIC METABOLIC PANEL
Anion gap: 8 (ref 5–15)
BUN: 11 mg/dL (ref 8–23)
CO2: 28 mmol/L (ref 22–32)
CREATININE: 0.71 mg/dL (ref 0.44–1.00)
Calcium: 8.5 mg/dL — ABNORMAL LOW (ref 8.9–10.3)
Chloride: 108 mmol/L (ref 98–111)
GFR calc Af Amer: >60 mL/min (ref >60)
GFR calc non Af Amer: >60 mL/min (ref >60)
GLUCOSE: 98 mg/dL (ref 70–99)
Potassium: 3.7 mmol/L (ref 3.5–5.1)
SODIUM: 144 mmol/L (ref 135–145)

## 2018-01-19 LAB — BRAIN NATRIURETIC PEPTIDE: B Natriuretic Peptide: 72 pg/mL (ref 0.0–100.0)

## 2018-01-19 LAB — CBC
HCT: 38.6 % (ref 35.0–47.0)
HEMOGLOBIN: 13 g/dL (ref 12.0–16.0)
MCH: 32.8 pg (ref 26.0–34.0)
MCHC: 33.5 g/dL (ref 32.0–36.0)
MCV: 97.8 fL (ref 80.0–100.0)
Platelets: 267 10*3/uL (ref 150–440)
RBC: 3.95 MIL/uL (ref 3.80–5.20)
RDW: 13.4 % (ref 11.5–14.5)
WBC: 6.9 10*3/uL (ref 3.6–11.0)

## 2018-01-19 LAB — FIBRIN DERIVATIVES D-DIMER (ARMC ONLY): Fibrin derivatives D-dimer (ARMC): 392.33 ng/mL (FEU) (ref 0.00–499.00)

## 2018-01-19 LAB — TROPONIN I

## 2018-01-19 MED ORDER — IPRATROPIUM-ALBUTEROL 0.5-2.5 (3) MG/3ML IN SOLN
3.0000 mL | Freq: Once | RESPIRATORY_TRACT | Status: AC
  Administered 2018-01-19: 3 mL via RESPIRATORY_TRACT
  Filled 2018-01-19: qty 3

## 2018-01-19 MED ORDER — ALBUTEROL SULFATE HFA 108 (90 BASE) MCG/ACT IN AERS: 2.0000 | INHALATION_SPRAY | Freq: Four times a day (QID) | RESPIRATORY_TRACT | 2 refills | Status: DC | PRN

## 2018-01-19 MED ORDER — SODIUM CHLORIDE 0.9 % IV BOLUS
500.0000 mL | Freq: Once | INTRAVENOUS | Status: AC
  Administered 2018-01-19: 500 mL via INTRAVENOUS

## 2018-01-19 NOTE — Discharge Instructions (Signed)
Please continue your regular medications.  Please return here for fever shortness of breath or feeling sicker.  You can use an albuterol inhaler 2 puffs 4 times a day if it helps.  Please follow-up with your regular doctor in the next week or so.

## 2018-01-19 NOTE — ED Notes (Signed)
Patient transported to X-ray 

## 2018-01-19 NOTE — ED Notes (Signed)
FIRST NURSE NOTE:  Dr. Rollene Fare pt to come to ED due to sxs of HF and retaining fluid overnight.

## 2018-01-19 NOTE — ED Provider Notes (Signed)
[facility] Emergency Department Provider Note   ____________________________________________   First MD Initiated Contact with Patient 01/19/18 1651     (approximate)  I have reviewed the triage vital signs and the nursing notes.   HISTORY  Chief Complaint Fluid Retention    HPI Teresa Chambers is a 82 y.o. female who reports she was in her usual state of good health until this morning.  This morning she became a little more short of breath than usual has a dry hacking cough and her temperature instead of usually being 96 is now 98.  She does not have any increased difficulty lying down flat.  She reportedly gained 5 pounds overnight.  She does not have any swelling in her arms or legs.  She had this in her right arm but that went away and there was not any numbness elsewhere.  I am not certain if this was due to lying on the arm.  Dr. Rebecka Apley sent her here because of the increased shortness of breath and the dry cough and a 5 pound weight gain.  Patient was tachycardic in triage but here in the emergency room when she is sitting on the stretcher her heart rates 94-98 O2 sats on room air 98 to 99%.  Patient is breathing somewhat fast.   Past Medical History:  Diagnosis Date  . Asthma   . CHF (congestive heart failure) ([place])   . COPD (chronic obstructive pulmonary disease) ([place])   . Palpitations     There are no active problems to display for this patient.   Past Surgical History:  Procedure Laterality Date  . CHOLECYSTECTOMY    . COLON SURGERY    . HERNIA REPAIR      Prior to Admission medications   Medication Sig Start Date End Date Taking? Authorizing Provider  diazepam (VALIUM) 2 MG tablet Take 1 tablet by mouth 2 (two) times daily as needed. 12/08/17   [provider]  DULoxetine (CYMBALTA) 60 MG capsule Take 1 capsule by mouth 2 (two) times daily. 01/09/18   [provider]  enalapril (VASOTEC) 5 MG tablet Take 1 tablet by  mouth daily. 11/24/17   [provider]  Fe Fum-Fe Poly-Vit C-Lactobac (FUSION) 65-65-25-30 MG CAPS Take 1 capsule by mouth daily. 04/01/17   [provider]  fluticasone (FLONASE) 50 MCG/ACT nasal spray Place 2 sprays into the nose daily. 04/20/17   [provider]  Fluticasone-Salmeterol (ADVAIR) 250-50 MCG/DOSE AEPB Inhale 1 puff into the lungs 2 (two) times daily.    [provider]  montelukast (SINGULAIR) 10 MG tablet Take 1 tablet by mouth daily. 11/19/17   [provider]  omeprazole (PRILOSEC) 40 MG capsule Take 1 capsule by mouth daily. 01/09/18   [provider]  simvastatin (ZOCOR) 40 MG tablet Take 1 tablet by mouth daily. 01/09/18   [provider]    Allergies Codeine and Lidocaine  History reviewed. No pertinent family history.  Social History Social History   Tobacco Use  . Smoking status: Never Smoker  . Smokeless tobacco: Never Used  Substance Use Topics  . Alcohol use: Never    Frequency: Never  . Drug use: Never    Review of Systems  Constitutional: No fever/chills Eyes: No visual changes. ENT: No sore throat. Cardiovascular: Denies chest pain. Respiratory: Mild shortness of breath. Gastrointestinal: No abdominal pain.  No nausea, no vomiting.  No diarrhea.  No constipation. Genitourinary: Negative for dysuria. Musculoskeletal: Negative for back pain.  Skin: Negative for rash. Neurological: Negative for headaches, focal weakness   ____________________________________________   PHYSICAL EXAM:  VITAL SIGNS: ED Triage Vitals  Enc Vitals Group     BP 01/19/18 1641 127/76     Pulse Rate 01/19/18 1641 (!) 113     Resp 01/19/18 1641 20     Temp 01/19/18 1641 98.2 F (36.8 C)     Temp Source 01/19/18 1641 Oral     SpO2 01/19/18 1641 98 %     Weight 01/19/18 1642 165 lb (74.8 kg)     Height 01/19/18 1642 5\' 9"  (1.753 m)     Head Circumference --      Peak Flow --      Pain Score 01/19/18 1642 5      Pain Loc --      Pain Edu? --      Excl. in [place]? --    Constitutional: Alert and oriented. Well appearing and in no acute distress. Eyes: Conjunctivae are normal.  Head: Atraumatic. Nose: No congestion/rhinnorhea. Mouth/Throat: Mucous membranes are moist.  Oropharynx non-erythematous. Neck: No stridor.   Cardiovascular: Normal rate, regular rhythm. Grossly normal heart sounds.  Good peripheral circulation. Respiratory: Normal respiratory effort.  No retractions. Lungs CTAB. Gastrointestinal: Soft and nontender. No distention. No abdominal bruits. No CVA tenderness. Musculoskeletal: No lower extremity tenderness nor edema.  No joint effusions. Neurologic:  Normal speech and language. No gross focal neurologic deficits are appreciated. No gait instability. Skin:  Skin is warm, dry and intact. No rash noted. Psychiatric: Mood and affect are normal. Speech and behavior are normal.  ____________________________________________   LABS (all labs ordered are listed, but only abnormal results are displayed)  Labs Reviewed  BASIC METABOLIC PANEL - Abnormal; Notable for the following components:      Result Value   Calcium 8.5 (*)    All other components within normal limits  CBC  TROPONIN I  BRAIN NATRIURETIC PEPTIDE  FIBRIN DERIVATIVES D-DIMER ([facility] ONLY)   ____________________________________________  EKG  EKG read and interpreted by me shows sinus tachycardia at a rate of 113 left axis no acute ST-T wave changes no marked changes since May 2010. ____________________________________________  [place]  ED MD interpretation:    Official radiology report(s): Dg Chest 2 View  Result Date: 01/19/2018 CLINICAL DATA:  Shortness of breath and chest pain today, asthma, pneumonia, bronchitis, COPD, CHF EXAM: CHEST - 2 VIEW COMPARISON:  None FINDINGS: Normal heart size, mediastinal contours, and pulmonary vascularity. Atherosclerotic calcification aorta. Minimal peribronchial  thickening. Large calcified granuloma LEFT lower lobe with question additional smaller calcified granuloma at medial LEFT lung base. No acute infiltrate, pleural effusion, or pneumothorax. Bones demineralized. IMPRESSION: Mild bronchitic and old granulomatous disease changes. No acute abnormalities. Electronically Signed   By: Lavonia Dana M.D.   On: 01/19/2018 17:28    ____________________________________________   PROCEDURES  Procedure(s) performed:   Procedures  Critical Care performed:   ____________________________________________   INITIAL IMPRESSION / ASSESSMENT AND PLAN / ED COURSE  Patient with mild intermittent dry cough today.  Oxygen saturations are good troponin d-dimer and BNP are all well within normal limits.  I will discharge the patient keep her on her regular medicines have her follow-up with her doctor.  She can return here if she is worse.  If this continues it may be due to her ACE inhibitor possibly.         ____________________________________________   FINAL CLINICAL IMPRESSION(S) / ED DIAGNOSES  Final diagnoses:  Cough  ED Discharge Orders    None       Note:  This document was prepared using Dragon voice recognition software and may include unintentional dictation errors.    Nena Polio, MD 01/19/18 Einar Crow

## 2018-01-19 NOTE — ED Triage Notes (Signed)
Pt presents to ED via POV states, was instructed by nurse at insurance company to come to ED due to overnight weight gain of 5lbs, dry/hacky cough with no productive sputum, and sudden onset dizziness today.

## 2018-01-19 NOTE — Patient Outreach (Signed)
[place] [facility]) Care Management  01/19/2018  Teresa Chambers 1935/09/07 [id number]   RN Health Coach telephone call to patient.  Hipaa compliance verified. RN Health Coach called patient for disease management of COPD. Patient started describing symptoms she was experiencing.  Per patient she is having a dry cough that feels like it stops at her esophagus. Patient stated she weighed yesterday and today and her scale showed she had gained 5 pounds in 1 day. Patient stated she had swelling in her hands and her clothes felt tight around the waist. Patient stated for some reason her arm was felling numb and tingling. Patient was able to raise both hands over head and stated they both came down together at the same time. No drooping feeling of mouth. Patient stated her nebulizer medication had expired and wanted to know if she could still use it. The medication expired in 2017. I explained to the patient that no she could not use it,  and that a new prescription would need to be called in. RN explained that patient needs to call the dr office immediately and let the Dr know of the symptoms above.  Plan.[name] was going to call the dr office as soon as we hang up. RN Health Coach will follow up with patient  [place] [facility] [phone]

## 2018-01-20 NOTE — Patient Outreach (Signed)
[name]) Care Management  [place]   01/20/2018  Teresa Chambers [birth year] [id number]  Subjective: 83 y.o. year old female referred to [place] for Medication Assistance (Advair) Referred by Quinn Plowman, [facility] Telephonic RNCM, and HTA concierge for patient assistance for Advair.   PMH s/f: Asthma, osteoporosis, GERD  Patient with [employer] Medicare Advantage plan.   Patient confirms identity with HIPAA-identifiers x2 and gives verbal consent to speak over the phone about medications.   Medication Adherence: Patient reports 100% adherence. Fill history reflects consistent compliance. 90 days supply.   Medication Assistance:  Patient is having difficulty affording Advair. She successfully applied to [place] medication assistance on her own.    Medication Management:  Manages her own medications. Thinks she is overall doing "pretty good at staying on top of everything".  Objective:   Current Medications: Current Outpatient Medications  Medication Sig Dispense Refill  . albuterol (PROVENTIL HFA;VENTOLIN HFA) 108 (90 Base) MCG/ACT inhaler Inhale 2 puffs into the lungs every 6 (six) hours as needed for wheezing or shortness of breath. 1 Inhaler 2  . diazepam (VALIUM) 2 MG tablet Take 1 tablet by mouth 2 (two) times daily as needed.  0  . DULoxetine (CYMBALTA) 60 MG capsule Take 1 capsule by mouth 2 (two) times daily.  2  . enalapril (VASOTEC) 5 MG tablet Take 1 tablet by mouth daily.  5  . fluticasone (FLONASE) 50 MCG/ACT nasal spray Place 2 sprays into the nose daily.    . Fluticasone-Salmeterol (ADVAIR) 250-50 MCG/DOSE AEPB Inhale 1 puff into the lungs 2 (two) times daily.    Marland Kitchen Lifitegrast (XIIDRA) 5 % SOLN Apply to eye.    . montelukast (SINGULAIR) 10 MG tablet Take 1 tablet by mouth daily.  3  . omeprazole (PRILOSEC) 40 MG capsule Take 1 capsule by mouth daily.  3  . simvastatin (ZOCOR) 40 MG tablet Take 1 tablet by mouth daily.  3  .  albuterol (ACCUNEB) 1.25 MG/3ML nebulizer solution Inhale 1.25 mg into the lungs every 6 (six) hours as needed.    . Fe Fum-Fe Poly-Vit C-Lactobac (FUSION) 65-65-25-30 MG CAPS Take 1 capsule by mouth daily.     No current facility-administered medications for this visit.     Functional Status: In your present state of health, do you have any difficulty performing the following activities: 01/19/2018  Hearing? N  Vision? N  Difficulty concentrating or making decisions? N  Dressing or bathing? N  Doing errands, shopping? Y  Preparing Food and eating ? N  Using the Toilet? N  In the past six months, have you accidently leaked urine? N  Do you have problems with loss of bowel control? N  Managing your Medications? N  Managing your Finances? N  Housekeeping or managing your Housekeeping? N  Some recent data might be hidden    Fall/Depression Screening: Fall Risk  01/19/2018  Falls in the past year? No   PHQ 2/9 Scores 01/19/2018 01/11/2018  PHQ - 2 Score 0 0   Assessment:  Drugs sorted by system:  Neurologic/Psychologic: duloxetine, diazepam  Cardiovascular: enalapril, simvastatin  Pulmonary/Allergy: Proventil, Advair, montelukast, fluticasone  Gastrointestinal: omeprazole  Vitamins/Minerals:  Infectious Diseases:  Miscellaneous: Xiidra   Duplications in therapy: albuterol neb + Proventil= patient not currently using nebulizer Medications to avoid in the elderly: diazepam, omeprazole  Plan: Contact Dr. Rebecka Apley to see if [name] would be an appropriate switch for patient. Dulera and Proventil are available through [employer] and are easier  to get MAP approved as no minimum expenditure is required. Will also speak to Dr. Rebecka Apley about nebulizer solution- often it is a lower cost as it is billed through part B.   Follow up in 3-5 business days.   [name], PharmD Clinical Pharmacist, [place] [facility] [phone]

## 2018-01-22 ENCOUNTER — Ambulatory Visit: Payer: PPO | Admitting: Pharmacist

## 2018-01-26 ENCOUNTER — Ambulatory Visit: Payer: PPO | Admitting: *Deleted

## 2018-01-28 ENCOUNTER — Encounter: Payer: Self-pay | Admitting: Pharmacist

## 2018-01-28 ENCOUNTER — Other Ambulatory Visit: Payer: Self-pay | Admitting: Pharmacist

## 2018-01-28 DIAGNOSIS — J45901 Unspecified asthma with (acute) exacerbation: Secondary | ICD-10-CM

## 2018-01-28 DIAGNOSIS — J441 Chronic obstructive pulmonary disease with (acute) exacerbation: Secondary | ICD-10-CM

## 2018-01-28 NOTE — Patient Outreach (Signed)
[place]) Care Management  01/28/2018  Teresa Chambers September 17, 1935 [id number]   Care coordination with pharmacy and primary care provider.   Spoke with pharmacy technician at [facility] to verify patient's inhaler prescriptions (ProAir, Advair). Pharmacy states that patient still has active [facility] prescription. This is not what the patient discussed with me when we reviewed her medications on 01/19/18.   Left message with Dr. [name] RN [place] asking if Dr. Lavera Guise would feel comfortable switching Advair to [facility] as it is easier to get approved. Upon call back, will clarify Breo issue.   Plan:  Awaiting call back information from Dr. Lavera Guise. Will plan to move forward with MAP applications for Proventil and either Dulera or Advair ASAP upon receipt of follow up information.   [name], PharmD Clinical Pharmacist, [place] [facility] [phone]

## 2018-01-28 NOTE — Progress Notes (Signed)
  This encounter was created in error - please disregard.  This encounter was created in error - please disregard.  This encounter was created in error - please disregard.  This encounter was created in error - please disregard. 

## 2018-02-01 ENCOUNTER — Other Ambulatory Visit: Payer: Self-pay | Admitting: Pharmacist

## 2018-02-01 ENCOUNTER — Other Ambulatory Visit: Payer: Self-pay | Admitting: Pharmacy Technician

## 2018-02-01 ENCOUNTER — Ambulatory Visit: Payer: Self-pay | Admitting: Pharmacist

## 2018-02-01 NOTE — Patient Outreach (Signed)
[name]) Care Management  02/01/2018  Teresa Chambers [birth year] [id number]    82 y.o. year old female referred to [place] for Medication Assistance (Advair, Proventil)   I have not heard back from Dr. Lavera Guise regarding patient's inhalers.   Was unable to reach patient via telephone today and have left HIPAA compliant voicemail asking patient to return my call. (unsuccessful outreach #1).  Medication Assistance:  #Proventil: medication assistance provided by [employer]. No minimum OOP threshold must be met  #Advair: medication assistance provided by [place]. Patient must have met $67 OOP prescription cost and provide proof of income.   Plan: Proceed with applications for Proventil ([name]) and Advair ([place]) as I leave for [place] tomorrow. Coverage pharmacist will follow up to ensure patient received applications in 1 week. I will request TROOP from [place] to include in the [place] application.   [name], PharmD Clinical Pharmacist, [place] [facility] [phone]

## 2018-02-01 NOTE — Patient Outreach (Signed)
[place] [facility]) Care Management  02/01/2018  Teresa Chambers 1936-05-27 [id number]   Received [place] patient assistance referral from [place] for Ventolin HFA and Advair. Prepared patient portion to be mailed out and faxed provider portion to Dr. Lavera Guise.  Will follow up with patient in 7-10 business days to confirm application was received.  Maud Deed [place], [place] [facility] [phone]

## 2018-02-08 ENCOUNTER — Ambulatory Visit: Payer: Self-pay | Admitting: Pharmacist

## 2018-02-10 ENCOUNTER — Other Ambulatory Visit: Payer: Self-pay | Admitting: Pharmacy Technician

## 2018-02-10 NOTE — Patient Outreach (Signed)
[name]) Care Management  02/10/2018  Teresa Chambers [birth year] [id number]   Received scripts for Ventolin HFA and Advair from Dr. Lavera Guise. Will submit to [place] once patient portion of application is received.  Maud Deed [place], [place] [facility] [phone]

## 2018-02-11 ENCOUNTER — Inpatient Hospital Stay: Payer: PPO

## 2018-02-11 ENCOUNTER — Encounter
Admission: EM | Disposition: A | Discharge status: Skilled Nursing Facility | Payer: Self-pay | Source: Home / Self Care | Attending: Internal Medicine

## 2018-02-11 ENCOUNTER — Inpatient Hospital Stay: Payer: PPO | Admitting: Anesthesiology

## 2018-02-11 ENCOUNTER — Inpatient Hospital Stay
Admission: EM | Admit: 2018-02-11 | Discharge: 2018-02-13 | DRG: 470 | Disposition: A | Discharge status: Skilled Nursing Facility | Payer: PPO | Attending: Internal Medicine | Admitting: Internal Medicine

## 2018-02-11 ENCOUNTER — Emergency Department: Payer: PPO

## 2018-02-11 ENCOUNTER — Other Ambulatory Visit: Payer: Self-pay

## 2018-02-11 DIAGNOSIS — Z471 Aftercare following joint replacement surgery: Secondary | ICD-10-CM | POA: Diagnosis not present

## 2018-02-11 DIAGNOSIS — I509 Heart failure, unspecified: Secondary | ICD-10-CM | POA: Diagnosis not present

## 2018-02-11 DIAGNOSIS — J449 Chronic obstructive pulmonary disease, unspecified: Secondary | ICD-10-CM | POA: Diagnosis not present

## 2018-02-11 DIAGNOSIS — Z136 Encounter for screening for cardiovascular disorders: Secondary | ICD-10-CM | POA: Diagnosis not present

## 2018-02-11 DIAGNOSIS — J309 Allergic rhinitis, unspecified: Secondary | ICD-10-CM | POA: Diagnosis not present

## 2018-02-11 DIAGNOSIS — I110 Hypertensive heart disease with heart failure: Secondary | ICD-10-CM | POA: Diagnosis present

## 2018-02-11 DIAGNOSIS — S72009A Fracture of unspecified part of neck of unspecified femur, initial encounter for closed fracture: Secondary | ICD-10-CM | POA: Diagnosis present

## 2018-02-11 DIAGNOSIS — K219 Gastro-esophageal reflux disease without esophagitis: Secondary | ICD-10-CM | POA: Diagnosis present

## 2018-02-11 DIAGNOSIS — R05 Cough: Secondary | ICD-10-CM | POA: Diagnosis not present

## 2018-02-11 DIAGNOSIS — S72011D Unspecified intracapsular fracture of right femur, subsequent encounter for closed fracture with routine healing: Secondary | ICD-10-CM | POA: Diagnosis not present

## 2018-02-11 DIAGNOSIS — Z884 Allergy status to anesthetic agent status: Secondary | ICD-10-CM

## 2018-02-11 DIAGNOSIS — J441 Chronic obstructive pulmonary disease with (acute) exacerbation: Secondary | ICD-10-CM | POA: Diagnosis not present

## 2018-02-11 DIAGNOSIS — E785 Hyperlipidemia, unspecified: Secondary | ICD-10-CM | POA: Diagnosis not present

## 2018-02-11 DIAGNOSIS — F329 Major depressive disorder, single episode, unspecified: Secondary | ICD-10-CM | POA: Diagnosis present

## 2018-02-11 DIAGNOSIS — R0902 Hypoxemia: Secondary | ICD-10-CM | POA: Diagnosis present

## 2018-02-11 DIAGNOSIS — M810 Age-related osteoporosis without current pathological fracture: Secondary | ICD-10-CM | POA: Diagnosis not present

## 2018-02-11 DIAGNOSIS — Z96649 Presence of unspecified artificial hip joint: Secondary | ICD-10-CM

## 2018-02-11 DIAGNOSIS — S72011A Unspecified intracapsular fracture of right femur, initial encounter for closed fracture: Secondary | ICD-10-CM | POA: Diagnosis not present

## 2018-02-11 DIAGNOSIS — Z79899 Other long term (current) drug therapy: Secondary | ICD-10-CM | POA: Diagnosis not present

## 2018-02-11 DIAGNOSIS — D649 Anemia, unspecified: Secondary | ICD-10-CM | POA: Diagnosis not present

## 2018-02-11 DIAGNOSIS — Z9981 Dependence on supplemental oxygen: Secondary | ICD-10-CM | POA: Diagnosis not present

## 2018-02-11 DIAGNOSIS — Z9181 History of falling: Secondary | ICD-10-CM | POA: Diagnosis not present

## 2018-02-11 DIAGNOSIS — S72031A Displaced midcervical fracture of right femur, initial encounter for closed fracture: Secondary | ICD-10-CM | POA: Diagnosis not present

## 2018-02-11 DIAGNOSIS — S72041A Displaced fracture of base of neck of right femur, initial encounter for closed fracture: Secondary | ICD-10-CM | POA: Diagnosis not present

## 2018-02-11 DIAGNOSIS — H04123 Dry eye syndrome of bilateral lacrimal glands: Secondary | ICD-10-CM | POA: Diagnosis not present

## 2018-02-11 DIAGNOSIS — Z885 Allergy status to narcotic agent status: Secondary | ICD-10-CM | POA: Diagnosis not present

## 2018-02-11 DIAGNOSIS — I700 Atherosclerosis of aorta: Secondary | ICD-10-CM | POA: Diagnosis not present

## 2018-02-11 DIAGNOSIS — M25559 Pain in unspecified hip: Secondary | ICD-10-CM | POA: Diagnosis not present

## 2018-02-11 DIAGNOSIS — Z7401 Bed confinement status: Secondary | ICD-10-CM | POA: Diagnosis not present

## 2018-02-11 DIAGNOSIS — R059 Cough, unspecified: Secondary | ICD-10-CM

## 2018-02-11 DIAGNOSIS — S72001A Fracture of unspecified part of neck of right femur, initial encounter for closed fracture: Secondary | ICD-10-CM | POA: Diagnosis not present

## 2018-02-11 DIAGNOSIS — R262 Difficulty in walking, not elsewhere classified: Secondary | ICD-10-CM | POA: Diagnosis not present

## 2018-02-11 DIAGNOSIS — M25551 Pain in right hip: Secondary | ICD-10-CM | POA: Diagnosis not present

## 2018-02-11 DIAGNOSIS — Z96641 Presence of right artificial hip joint: Secondary | ICD-10-CM | POA: Diagnosis not present

## 2018-02-11 DIAGNOSIS — Z7951 Long term (current) use of inhaled steroids: Secondary | ICD-10-CM | POA: Diagnosis not present

## 2018-02-11 DIAGNOSIS — W06XXXA Fall from bed, initial encounter: Secondary | ICD-10-CM | POA: Diagnosis present

## 2018-02-11 DIAGNOSIS — M6281 Muscle weakness (generalized): Secondary | ICD-10-CM | POA: Diagnosis not present

## 2018-02-11 HISTORY — PX: HIP ARTHROPLASTY: SHX981

## 2018-02-11 LAB — SURGICAL PCR SCREEN
MRSA, PCR: NEGATIVE
Staphylococcus aureus: NEGATIVE

## 2018-02-11 LAB — COMPREHENSIVE METABOLIC PANEL
ALBUMIN: 3.6 g/dL (ref 3.5–5.0)
ALT: 20 U/L (ref 0–44)
AST: 24 U/L (ref 15–41)
Alkaline Phosphatase: 40 U/L (ref 38–126)
Anion gap: 6 (ref 5–15)
BILIRUBIN TOTAL: 0.6 mg/dL (ref 0.3–1.2)
BUN: 11 mg/dL (ref 8–23)
CO2: 30 mmol/L (ref 22–32)
Calcium: 8.5 mg/dL — ABNORMAL LOW (ref 8.9–10.3)
Chloride: 109 mmol/L (ref 98–111)
Creatinine, Ser: 0.69 mg/dL (ref 0.44–1.00)
GFR calc Af Amer: >60 mL/min (ref >60)
GFR calc non Af Amer: >60 mL/min (ref >60)
GLUCOSE: 112 mg/dL — AB (ref 70–99)
POTASSIUM: 3.7 mmol/L (ref 3.5–5.1)
Sodium: 145 mmol/L (ref 135–145)
TOTAL PROTEIN: 6.5 g/dL (ref 6.5–8.1)

## 2018-02-11 LAB — PROTIME-INR
INR: 1.01
Prothrombin Time: 13.2 seconds (ref 11.4–15.2)

## 2018-02-11 LAB — TSH: TSH: 4.254 u[IU]/mL (ref 0.350–4.500)

## 2018-02-11 LAB — CBC
HEMATOCRIT: 36.9 % (ref 35.0–47.0)
HEMOGLOBIN: 12.8 g/dL (ref 12.0–16.0)
MCH: 33.9 pg (ref 26.0–34.0)
MCHC: 34.6 g/dL (ref 32.0–36.0)
MCV: 97.9 fL (ref 80.0–100.0)
Platelets: 234 10*3/uL (ref 150–440)
RBC: 3.77 MIL/uL — ABNORMAL LOW (ref 3.80–5.20)
RDW: 13.5 % (ref 11.5–14.5)
WBC: 10.6 10*3/uL (ref 3.6–11.0)

## 2018-02-11 SURGERY — HEMIARTHROPLASTY, HIP, DIRECT ANTERIOR APPROACH, FOR FRACTURE
Anesthesia: Spinal | Laterality: Right

## 2018-02-11 MED ORDER — ACETAMINOPHEN 325 MG PO TABS: 325.0000 mg | ORAL_TABLET | ORAL | Status: DC | PRN

## 2018-02-11 MED ORDER — ENALAPRIL MALEATE 5 MG PO TABS
5.0000 mg | ORAL_TABLET | Freq: Every day | ORAL | Status: DC
  Administered 2018-02-13: 5 mg via ORAL
  Filled 2018-02-11 (×3): qty 1

## 2018-02-11 MED ORDER — NEOMYCIN-POLYMYXIN B GU 40-200000 IR SOLN
Status: DC | PRN
  Administered 2018-02-11: 14 mL

## 2018-02-11 MED ORDER — CEFAZOLIN SODIUM-DEXTROSE 1-4 GM/50ML-% IV SOLN
INTRAVENOUS | Status: AC
  Filled 2018-02-11: qty 50

## 2018-02-11 MED ORDER — CEFAZOLIN SODIUM-DEXTROSE 2-4 GM/100ML-% IV SOLN
2.0000 g | Freq: Four times a day (QID) | INTRAVENOUS | Status: AC
  Administered 2018-02-11 – 2018-02-12 (×3): 2 g via INTRAVENOUS
  Filled 2018-02-11 (×3): qty 100

## 2018-02-11 MED ORDER — SODIUM CHLORIDE 0.9 % IJ SOLN
INTRAMUSCULAR | Status: AC
  Filled 2018-02-11: qty 50

## 2018-02-11 MED ORDER — TRANEXAMIC ACID 1000 MG/10ML IV SOLN
INTRAVENOUS | Status: AC
  Filled 2018-02-11: qty 10

## 2018-02-11 MED ORDER — OXYCODONE HCL 5 MG PO TABS
5.0000 mg | ORAL_TABLET | ORAL | Status: DC | PRN
  Filled 2018-02-11: qty 1

## 2018-02-11 MED ORDER — SIMVASTATIN 40 MG PO TABS
40.0000 mg | ORAL_TABLET | Freq: Every day | ORAL | Status: DC
  Administered 2018-02-11 – 2018-02-12 (×2): 40 mg via ORAL
  Filled 2018-02-11 (×2): qty 1
  Filled 2018-02-11: qty 2
  Filled 2018-02-11: qty 1
  Filled 2018-02-11: qty 2

## 2018-02-11 MED ORDER — ONDANSETRON HCL 4 MG PO TABS: 4.0000 mg | ORAL_TABLET | Freq: Four times a day (QID) | ORAL | Status: DC | PRN

## 2018-02-11 MED ORDER — PAROXETINE HCL 10 MG PO TABS
10.0000 mg | ORAL_TABLET | Freq: Every day | ORAL | Status: DC
  Filled 2018-02-11: qty 1

## 2018-02-11 MED ORDER — BUPIVACAINE HCL (PF) 0.5 % IJ SOLN
INTRAMUSCULAR | Status: DC | PRN
  Administered 2018-02-11: 2.5 mL via INTRATHECAL

## 2018-02-11 MED ORDER — DULOXETINE HCL 60 MG PO CPEP
60.0000 mg | ORAL_CAPSULE | Freq: Two times a day (BID) | ORAL | Status: DC
  Administered 2018-02-11 – 2018-02-13 (×5): 60 mg via ORAL
  Filled 2018-02-11 (×6): qty 1

## 2018-02-11 MED ORDER — PROPOFOL 10 MG/ML IV BOLUS
INTRAVENOUS | Status: DC | PRN
  Administered 2018-02-11: 20 mg via INTRAVENOUS

## 2018-02-11 MED ORDER — GLYCOPYRROLATE 0.2 MG/ML IJ SOLN
INTRAMUSCULAR | Status: DC | PRN
  Administered 2018-02-11: 0.2 mg via INTRAVENOUS

## 2018-02-11 MED ORDER — DIAZEPAM 2 MG PO TABS
2.0000 mg | ORAL_TABLET | Freq: Two times a day (BID) | ORAL | Status: DC | PRN
  Administered 2018-02-12 – 2018-02-13 (×2): 2 mg via ORAL
  Filled 2018-02-11 (×2): qty 1

## 2018-02-11 MED ORDER — FENTANYL CITRATE (PF) 100 MCG/2ML IJ SOLN
INTRAMUSCULAR | Status: AC
  Filled 2018-02-11: qty 2

## 2018-02-11 MED ORDER — BUPIVACAINE LIPOSOME 1.3 % IJ SUSP
INTRAMUSCULAR | Status: DC | PRN
  Administered 2018-02-11: 20 mL

## 2018-02-11 MED ORDER — PROPOFOL 10 MG/ML IV BOLUS
INTRAVENOUS | Status: AC
  Filled 2018-02-11: qty 20

## 2018-02-11 MED ORDER — ENOXAPARIN SODIUM 40 MG/0.4ML ~~LOC~~ SOLN
40.0000 mg | SUBCUTANEOUS | Status: DC
  Administered 2018-02-12 – 2018-02-13 (×2): 40 mg via SUBCUTANEOUS
  Filled 2018-02-11 (×2): qty 0.4

## 2018-02-11 MED ORDER — PANTOPRAZOLE SODIUM 40 MG PO TBEC
40.0000 mg | DELAYED_RELEASE_TABLET | Freq: Every day | ORAL | Status: DC
  Administered 2018-02-12 – 2018-02-13 (×2): 40 mg via ORAL
  Filled 2018-02-11: qty 1

## 2018-02-11 MED ORDER — SODIUM CHLORIDE 0.9 % IV SOLN
INTRAVENOUS | Status: DC
  Administered 2018-02-11: 11:00:00 via INTRAVENOUS

## 2018-02-11 MED ORDER — ACETAMINOPHEN 160 MG/5ML PO SOLN: 325.0000 mg | ORAL | Status: DC | PRN

## 2018-02-11 MED ORDER — ACETAMINOPHEN 325 MG PO TABS: 650.0000 mg | ORAL_TABLET | Freq: Four times a day (QID) | ORAL | Status: DC | PRN

## 2018-02-11 MED ORDER — ACETAMINOPHEN 500 MG PO TABS
1000.0000 mg | ORAL_TABLET | Freq: Four times a day (QID) | ORAL | Status: AC
  Administered 2018-02-11 – 2018-02-12 (×4): 1000 mg via ORAL
  Filled 2018-02-11 (×4): qty 2

## 2018-02-11 MED ORDER — ONDANSETRON HCL 4 MG/2ML IJ SOLN: 4.0000 mg | Freq: Four times a day (QID) | INTRAMUSCULAR | Status: DC | PRN

## 2018-02-11 MED ORDER — MORPHINE SULFATE (PF) 2 MG/ML IV SOLN
2.0000 mg | INTRAVENOUS | Status: DC | PRN
  Administered 2018-02-11 (×2): 2 mg via INTRAVENOUS
  Filled 2018-02-11 (×2): qty 1

## 2018-02-11 MED ORDER — FLUTICASONE FUROATE-VILANTEROL 200-25 MCG/INH IN AEPB
1.0000 | INHALATION_SPRAY | Freq: Every day | RESPIRATORY_TRACT | Status: DC
Start: 2018-02-11 — End: 2018-02-13
  Administered 2018-02-11 – 2018-02-13 (×3): 1 via RESPIRATORY_TRACT
  Filled 2018-02-11: qty 28

## 2018-02-11 MED ORDER — CEFAZOLIN (ANCEF) 1 G IV SOLR
1.0000 g | INTRAVENOUS | Status: AC
  Administered 2018-02-11 (×2): 1 g

## 2018-02-11 MED ORDER — MEPERIDINE HCL 50 MG/ML IJ SOLN
6.2500 mg | INTRAMUSCULAR | Status: DC | PRN
Start: 2018-02-11 — End: 2018-02-11

## 2018-02-11 MED ORDER — METOCLOPRAMIDE HCL 10 MG PO TABS
5.0000 mg | ORAL_TABLET | Freq: Three times a day (TID) | ORAL | Status: DC | PRN
  Administered 2018-02-11: 10 mg via ORAL
  Filled 2018-02-11: qty 1

## 2018-02-11 MED ORDER — ONDANSETRON HCL 4 MG/2ML IJ SOLN
4.0000 mg | Freq: Four times a day (QID) | INTRAMUSCULAR | Status: DC | PRN
  Administered 2018-02-12: 4 mg via INTRAVENOUS
  Filled 2018-02-11: qty 2

## 2018-02-11 MED ORDER — PANTOPRAZOLE SODIUM 40 MG PO TBEC
40.0000 mg | DELAYED_RELEASE_TABLET | Freq: Every day | ORAL | Status: DC
  Filled 2018-02-11: qty 1

## 2018-02-11 MED ORDER — HYDROMORPHONE HCL 1 MG/ML IJ SOLN: 0.5000 mg | INTRAMUSCULAR | Status: DC | PRN

## 2018-02-11 MED ORDER — FLEET ENEMA 7-19 GM/118ML RE ENEM: 1.0000 | ENEMA | Freq: Once | RECTAL | Status: DC | PRN

## 2018-02-11 MED ORDER — ONDANSETRON HCL 4 MG/2ML IJ SOLN
4.0000 mg | Freq: Once | INTRAMUSCULAR | Status: AC
  Administered 2018-02-11: 4 mg via INTRAVENOUS
  Filled 2018-02-11: qty 2

## 2018-02-11 MED ORDER — BUPIVACAINE-EPINEPHRINE (PF) 0.25% -1:200000 IJ SOLN
INTRAMUSCULAR | Status: AC
  Filled 2018-02-11: qty 30

## 2018-02-11 MED ORDER — FENTANYL CITRATE (PF) 100 MCG/2ML IJ SOLN: 25.0000 ug | INTRAMUSCULAR | Status: DC | PRN

## 2018-02-11 MED ORDER — ACETAMINOPHEN 650 MG RE SUPP: 650.0000 mg | Freq: Four times a day (QID) | RECTAL | Status: DC | PRN

## 2018-02-11 MED ORDER — BUPIVACAINE LIPOSOME 1.3 % IJ SUSP
INTRAMUSCULAR | Status: AC
  Filled 2018-02-11: qty 20

## 2018-02-11 MED ORDER — FLUTICASONE PROPIONATE 50 MCG/ACT NA SUSP
2.0000 | Freq: Every day | NASAL | Status: DC
  Administered 2018-02-12 – 2018-02-13 (×2): 2 via NASAL
  Filled 2018-02-11: qty 16

## 2018-02-11 MED ORDER — BUPIVACAINE-EPINEPHRINE (PF) 0.5% -1:200000 IJ SOLN
INTRAMUSCULAR | Status: AC
  Filled 2018-02-11: qty 30

## 2018-02-11 MED ORDER — BUPIVACAINE-EPINEPHRINE (PF) 0.5% -1:200000 IJ SOLN
INTRAMUSCULAR | Status: DC | PRN
  Administered 2018-02-11: 30 mL via PERINEURAL

## 2018-02-11 MED ORDER — MIDAZOLAM HCL 2 MG/2ML IJ SOLN
INTRAMUSCULAR | Status: AC
  Filled 2018-02-11: qty 2

## 2018-02-11 MED ORDER — DIPHENHYDRAMINE HCL 12.5 MG/5ML PO ELIX: 12.5000 mg | ORAL_SOLUTION | ORAL | Status: DC | PRN

## 2018-02-11 MED ORDER — PHENYLEPHRINE HCL 10 MG/ML IJ SOLN
INTRAMUSCULAR | Status: AC
  Filled 2018-02-11: qty 1

## 2018-02-11 MED ORDER — MAGNESIUM HYDROXIDE 400 MG/5ML PO SUSP
30.0000 mL | Freq: Every day | ORAL | Status: DC | PRN
  Administered 2018-02-13: 30 mL via ORAL
  Filled 2018-02-11: qty 30

## 2018-02-11 MED ORDER — FUSION 65-65-25-30 MG PO CAPS: 1.0000 | ORAL_CAPSULE | Freq: Every day | ORAL | Status: DC

## 2018-02-11 MED ORDER — TRANEXAMIC ACID 1000 MG/10ML IV SOLN
INTRAVENOUS | Status: DC | PRN
  Administered 2018-02-11: 1000 mg via TOPICAL

## 2018-02-11 MED ORDER — PROPOFOL 500 MG/50ML IV EMUL
INTRAVENOUS | Status: DC | PRN
  Administered 2018-02-11: 75 ug/kg/min via INTRAVENOUS

## 2018-02-11 MED ORDER — METOCLOPRAMIDE HCL 5 MG/ML IJ SOLN: 5.0000 mg | Freq: Three times a day (TID) | INTRAMUSCULAR | Status: DC | PRN

## 2018-02-11 MED ORDER — MIDAZOLAM HCL 2 MG/2ML IJ SOLN
INTRAMUSCULAR | Status: DC | PRN
  Administered 2018-02-11: 1 mg via INTRAVENOUS

## 2018-02-11 MED ORDER — SODIUM CHLORIDE 0.9 % IV SOLN
INTRAVENOUS | Status: DC | PRN
  Administered 2018-02-11: 25 ug/min via INTRAVENOUS

## 2018-02-11 MED ORDER — PHENYLEPHRINE HCL 10 MG/ML IJ SOLN
INTRAMUSCULAR | Status: DC | PRN
  Administered 2018-02-11 (×10): 100 ug via INTRAVENOUS

## 2018-02-11 MED ORDER — ACETAMINOPHEN 325 MG PO TABS
325.0000 mg | ORAL_TABLET | Freq: Four times a day (QID) | ORAL | Status: DC | PRN
  Administered 2018-02-13: 650 mg via ORAL
  Filled 2018-02-11: qty 2

## 2018-02-11 MED ORDER — PROPOFOL 500 MG/50ML IV EMUL
INTRAVENOUS | Status: AC
  Filled 2018-02-11: qty 50

## 2018-02-11 MED ORDER — MONTELUKAST SODIUM 10 MG PO TABS
10.0000 mg | ORAL_TABLET | Freq: Every day | ORAL | Status: DC
  Administered 2018-02-11 – 2018-02-12 (×2): 10 mg via ORAL
  Filled 2018-02-11 (×2): qty 1

## 2018-02-11 MED ORDER — DOCUSATE SODIUM 100 MG PO CAPS
100.0000 mg | ORAL_CAPSULE | Freq: Two times a day (BID) | ORAL | Status: DC
  Administered 2018-02-11 – 2018-02-13 (×4): 100 mg via ORAL
  Filled 2018-02-11 (×4): qty 1

## 2018-02-11 MED ORDER — TRAMADOL HCL 50 MG PO TABS
50.0000 mg | ORAL_TABLET | Freq: Four times a day (QID) | ORAL | Status: DC | PRN
  Administered 2018-02-11 – 2018-02-12 (×3): 50 mg via ORAL
  Filled 2018-02-11 (×3): qty 1

## 2018-02-11 MED ORDER — BISACODYL 10 MG RE SUPP: 10.0000 mg | Freq: Every day | RECTAL | Status: DC | PRN

## 2018-02-11 MED ORDER — SODIUM CHLORIDE 0.9 % IV SOLN: INTRAVENOUS | Status: DC

## 2018-02-11 MED ORDER — DOCUSATE SODIUM 100 MG PO CAPS: 100.0000 mg | ORAL_CAPSULE | Freq: Two times a day (BID) | ORAL | Status: DC

## 2018-02-11 MED ORDER — NEOMYCIN-POLYMYXIN B GU 40-200000 IR SOLN
Status: AC
  Filled 2018-02-11: qty 20

## 2018-02-11 MED ORDER — FENTANYL CITRATE (PF) 100 MCG/2ML IJ SOLN
INTRAMUSCULAR | Status: DC | PRN
  Administered 2018-02-11 (×2): 12.5 ug via INTRAVENOUS

## 2018-02-11 MED ORDER — MORPHINE SULFATE (PF) 2 MG/ML IV SOLN
2.0000 mg | INTRAVENOUS | Status: DC | PRN
  Administered 2018-02-11: 2 mg via INTRAVENOUS
  Filled 2018-02-11: qty 1

## 2018-02-11 MED ORDER — DEXAMETHASONE SODIUM PHOSPHATE 10 MG/ML IJ SOLN
INTRAMUSCULAR | Status: DC | PRN
  Administered 2018-02-11: 10 mg via INTRAVENOUS

## 2018-02-11 MED ORDER — MORPHINE SULFATE (PF) 2 MG/ML IV SOLN
2.0000 mg | Freq: Once | INTRAVENOUS | Status: AC
  Administered 2018-02-11: 2 mg via INTRAVENOUS
  Filled 2018-02-11: qty 1

## 2018-02-11 MED ORDER — PROMETHAZINE HCL 25 MG/ML IJ SOLN: 6.2500 mg | INTRAMUSCULAR | Status: DC | PRN

## 2018-02-11 SURGICAL SUPPLY — 63 items
BAG DECANTER FOR FLEXI CONT (MISCELLANEOUS) IMPLANT
BLADE SAGITTAL WIDE XTHICK NO (BLADE) ×3 IMPLANT
BLADE SURG SZ20 CARB STEEL (BLADE) ×3 IMPLANT
BNDG COHESIVE 6X5 TAN STRL LF (GAUZE/BANDAGES/DRESSINGS) ×3 IMPLANT
BOWL CEMENT MIXING ADV NOZZLE (MISCELLANEOUS) IMPLANT
CANISTER SUCT 1200ML W/VALVE (MISCELLANEOUS) ×3 IMPLANT
CANISTER SUCT 3000ML PPV (MISCELLANEOUS) ×6 IMPLANT
CHLORAPREP W/TINT 26ML (MISCELLANEOUS) ×6 IMPLANT
DECANTER SPIKE VIAL GLASS SM (MISCELLANEOUS) ×6 IMPLANT
DRAPE IMP U-DRAPE 54X76 (DRAPES) ×6 IMPLANT
DRAPE INCISE IOBAN 66X60 STRL (DRAPES) ×3 IMPLANT
DRAPE SHEET LG 3/4 BI-LAMINATE (DRAPES) ×3 IMPLANT
DRAPE SURG 17X11 SM STRL (DRAPES) ×3 IMPLANT
DRAPE SURG 17X23 STRL (DRAPES) ×3 IMPLANT
DRSG OPSITE POSTOP 4X12 (GAUZE/BANDAGES/DRESSINGS) ×3 IMPLANT
DRSG OPSITE POSTOP 4X14 (GAUZE/BANDAGES/DRESSINGS) ×1 IMPLANT
ELECT BLADE 6.5 EXT (BLADE) ×3 IMPLANT
ELECT CAUTERY BLADE 6.4 (BLADE) ×3 IMPLANT
ELECT REM PT RETURN 9FT ADLT (ELECTROSURGICAL) ×3
ELECTRODE REM PT RTRN 9FT ADLT (ELECTROSURGICAL) ×1 IMPLANT
GAUZE PACK 2X3YD (MISCELLANEOUS) IMPLANT
GLOVE BIO SURGEON STRL SZ8 (GLOVE) ×6 IMPLANT
GLOVE INDICATOR 8.0 STRL GRN (GLOVE) ×3 IMPLANT
GOWN STRL REUS W/ TWL LRG LVL3 (GOWN DISPOSABLE) ×1 IMPLANT
GOWN STRL REUS W/ TWL XL LVL3 (GOWN DISPOSABLE) ×1 IMPLANT
GOWN STRL REUS W/TWL LRG LVL3 (GOWN DISPOSABLE) ×3
GOWN STRL REUS W/TWL XL LVL3 (GOWN DISPOSABLE) ×3
HEAD ENDO II MOD SZ 45 (Orthopedic Implant) ×2 IMPLANT
HOOD PEEL AWAY FLYTE STAYCOOL (MISCELLANEOUS) ×6 IMPLANT
INSERT TAPER ENDO II -3 (Orthopedic Implant) ×2 IMPLANT
IV NS 100ML SINGLE PACK (IV SOLUTION) IMPLANT
LABEL OR SOLS (LABEL) ×3 IMPLANT
MAT ABSORB  FLUID 56X50 GRAY (MISCELLANEOUS) ×2
MAT ABSORB FLUID 56X50 GRAY (MISCELLANEOUS) ×1 IMPLANT
NDL FILTER BLUNT 18X1 1/2 (NEEDLE) ×1 IMPLANT
NDL SAFETY ECLIPSE 18X1.5 (NEEDLE) ×1 IMPLANT
NDL SPNL 20GX3.5 QUINCKE YW (NEEDLE) ×1 IMPLANT
NEEDLE FILTER BLUNT 18X 1/2SAF (NEEDLE) ×2
NEEDLE FILTER BLUNT 18X1 1/2 (NEEDLE) ×1 IMPLANT
NEEDLE HYPO 18GX1.5 SHARP (NEEDLE) ×3
NEEDLE SPNL 20GX3.5 QUINCKE YW (NEEDLE) ×3 IMPLANT
NS IRRIG 1000ML POUR BTL (IV SOLUTION) ×3 IMPLANT
PACK HIP PROSTHESIS (MISCELLANEOUS) ×3 IMPLANT
PULSAVAC PLUS IRRIG FAN TIP (DISPOSABLE) ×3
SOL .9 NS 3000ML IRR  AL (IV SOLUTION) ×4
SOL .9 NS 3000ML IRR AL (IV SOLUTION) ×2
SOL .9 NS 3000ML IRR UROMATIC (IV SOLUTION) ×2 IMPLANT
STAPLER SKIN PROX 35W (STAPLE) ×3 IMPLANT
STEM COLLARLESS 12X44MM (Stem) ×2 IMPLANT
STRAP SAFETY 5IN WIDE (MISCELLANEOUS) ×3 IMPLANT
SUT ETHIBOND 2 V 37 (SUTURE) ×9 IMPLANT
SUT VIC AB 1 CT1 36 (SUTURE) ×6 IMPLANT
SUT VIC AB 2-0 CT1 (SUTURE) ×9 IMPLANT
SUT VIC AB 2-0 CT1 27 (SUTURE) ×9
SUT VIC AB 2-0 CT1 TAPERPNT 27 (SUTURE) ×3 IMPLANT
SUT VICRYL 1-0 27IN ABS (SUTURE) ×6
SUTURE VICRYL 1-0 27IN ABS (SUTURE) ×2 IMPLANT
SYR 10ML LL (SYRINGE) ×3 IMPLANT
SYR 30ML LL (SYRINGE) ×9 IMPLANT
SYR TB 1ML 27GX1/2 LL (SYRINGE) IMPLANT
TAPE TRANSPORE STRL 2 31045 (GAUZE/BANDAGES/DRESSINGS) ×3 IMPLANT
TIP BRUSH PULSAVAC PLUS 24.33 (MISCELLANEOUS) ×3 IMPLANT
TIP FAN IRRIG PULSAVAC PLUS (DISPOSABLE) ×1 IMPLANT

## 2018-02-11 NOTE — H&P (Signed)
Paper H&P to be scanned into permanent record. H&P reviewed and patient re-examined. No changes. 

## 2018-02-11 NOTE — ED Notes (Signed)
Pt requesting pain medication.  

## 2018-02-11 NOTE — Op Note (Signed)
02/11/2018  3:57 PM  Patient:   Teresa Chambers  Pre-Op Diagnosis:   Displaced femoral neck fracture, right hip.  Post-Op Diagnosis:   Same.  Procedure:   Right hip unipolar hemiarthroplasty.  Surgeon:   Pascal Lux, MD  Assistant:   Cameron Proud, PA-C  Anesthesia:   Spinal  Findings:   As above.  Complications:   None  EBL:   150 cc  Fluids:   700 cc crystalloid  UOP:   400 cc  TT:   None  Drains:   None  Closure:   Staples  Implants:   Biomet press-fit system with a #12 standard offset reduced proximal profile Echo femoral stem, a 45 mm outer diameter shell, and a -3 mm neck adapter.  Brief Clinical Note:   The patient is a 82 year old female who sustained the above-noted injury late last evening when she apparently fell out of bed onto her right side. She was brought to the emergency room where x-rays demonstrated the above-noted injury. The patient has been cleared medically and presents at this time for definitive management of the injury.  Procedure:   The patient was brought into the operating room. After adequate spinal anesthesia was obtained, the patient was repositioned in the left lateral decubitus position and secured using a lateral hip positioner. The right hip and lower extremity were prepped with ChloroPrep solution before being draped sterilely. Preoperative antibiotics were administered. A timeout was performed to verify the appropriate surgical site before a standard posterior approach to the hip was made through an approximately 4-5 inch incision. The incision was carried down through the subcutaneous tissues to expose the gluteal fascia and proximal end of the iliotibial band. These structures were split the length of the incision and the [place] self-retaining hip retractor placed. The bursal tissues were swept posteriorly to expose the short external rotators. The anterior border of the piriformis tendon was identified and this plane developed down  through the capsule to enter the joint. Abundant fracture hematoma was suctioned. A flap of tissue was elevated off the posterior aspect of the femoral neck and greater trochanter and retracted posteriorly. This flap included the piriformis tendon, the short external rotators, and the posterior capsule. The femoral head was removed in its entirety, then taken to the back table where it was measured and found to be optimally replicated by a 45 mm head. The appropriate trial head was inserted and found to demonstrate an excellent suction fit.   Attention was directed to the femoral side. The femoral neck was recut 10-12 mm above the lesser trochanter using an oscillating saw. The piriformis fossa was debrided of soft tissues before the intramedullary canal was accessed through this point using a triple step reamer. The canal was reamed sequentially beginning with a #7 tapered reamer and progressing to a #12 tapered reamer. This provided excellent circumferential chatter. A box osteotome was used to establish version before the canal was broached sequentially beginning with a #9 broach and progressing to a #12 broach. This was left in place and several trial reductions performed. The permanent #12 standard offset reduced proximal profile femoral stem was impacted into place. A repeat trial reduction was performed using both the -6 mm and -3 mm neck lengths. The -3 mm neck length demonstrated excellent stability both in extension and external rotation as well as with flexion to 90 and internal rotation beyond 70. It also was stable in the position of sleep. The 45 mm outer diameter shell  with the -3 mm neck adapter construct was put together on the back table before being impacted onto the stem of the femoral component. The Morse taper locking mechanism was verified using manual distraction before the head was relocated and the hip placed through a range of motion with the findings as described above.  The wound  was copiously irrigated with bacitracin saline solution via the jet lavage system before the peri-incisional and pericapsular tissues were injected with 30 cc of 0.5% Sensorcaine with epinephrine and 20 cc of Exparel diluted out to 60 cc with normal saline to help with postoperative analgesia. The posterior flap was reapproximated to the posterior aspect of the greater trochanter using #2 Tycron interrupted sutures placed through drill holes. The iliotibial band was reapproximated using #0 Vicryl interrupted sutures before the gluteal fascia was closed using a running #0 Vicryl suture. At this point, 1 g of transexemic acid in 10 cc of normal saline was injected into the joint to help reduce postoperative bleeding. The subcutaneous tissues were closed in several layers using 2-0 Vicryl interrupted sutures before the skin was closed using staples. A sterile occlusive dressing was applied to the wound before the patient was placed into an abduction wedge pillow. The patient was rolled back into the supine position on the hospital bed before a Foley catheter was inserted, given that she had been under spinal anesthesia.  The patient was then awakened and returned to the recovery room in satisfactory condition after tolerating the procedure well.

## 2018-02-11 NOTE — Anesthesia Preprocedure Evaluation (Addendum)
Anesthesia Evaluation  Patient identified by MRN, date of birth, ID band Patient awake    Reviewed: Allergy & Precautions, H&P , NPO status , reviewed documented beta blocker date and time   Airway Mallampati: III  TM Distance: <3 FB   Mouth opening: Limited Mouth Opening  Dental  (+) Chipped   Pulmonary shortness of breath, asthma , COPD,  COPD inhaler,    Pulmonary exam normal  + decreased breath sounds      Cardiovascular +CHF  + dysrhythmias  Rhythm:regular  No evidence of heart failure   Neuro/Psych    GI/Hepatic GERD  Medicated and Controlled,  Endo/Other    Renal/GU      Musculoskeletal   Abdominal   Peds  Hematology   Anesthesia Other Findings Pt states "lidocaine" allergy was following a dental procedure, possible was novacaine. States has had bupivicaine without problems. Agreed to bupivicaine spinal.  Past Medical History: No date: Asthma No date: CHF (congestive heart failure) (HCC) No date: COPD (chronic obstructive pulmonary disease) (HCC) No date: Palpitations  Past Surgical History: No date: CHOLECYSTECTOMY No date: COLON SURGERY No date: HERNIA REPAIR  BMI    Body Mass Index:  20.78 kg/m      Reproductive/Obstetrics                           Anesthesia Physical Anesthesia Plan  ASA: III  Anesthesia Plan: Spinal and MAC   Post-op Pain Management:    Induction:   PONV Risk Score and Plan: Treatment may vary due to age or medical condition, Ondansetron, Midazolam and TIVA  Airway Management Planned:   Additional Equipment:   Intra-op Plan:   Post-operative Plan:   Informed Consent: I have reviewed the patients History and Physical, chart, labs and discussed the procedure including the risks, benefits and alternatives for the proposed anesthesia with the patient or authorized representative who has indicated his/her understanding and acceptance.    Dental Advisory Given  Plan Discussed with: CRNA  Anesthesia Plan Comments:        Anesthesia Quick Evaluation

## 2018-02-11 NOTE — Anesthesia Post-op Follow-up Note (Signed)
Anesthesia QCDR form completed.        

## 2018-02-11 NOTE — NC FL2 (Signed)
[place] LEVEL OF CARE SCREENING TOOL     IDENTIFICATION  Patient Name: Teresa Chambers Birthdate: [birth year] Sex: female Admission Date (Current Location): 02/11/2018  [place] and [place] Number:  Engineering geologist and Address:  [facility], [address], [place], [place] [address]      Provider Number: [id number]  Attending Physician Name and Address:  Epifanio Lesches, MD  Relative Name and Phone Number:       Current Level of Care: Hospital Recommended Level of Care: [place] Prior Approval Number:    Date Approved/Denied:   PASRR Number: [id number])  Discharge Plan: SNF    Current Diagnoses: Patient Active Problem List   Diagnosis Date Noted  . Hip fracture ([place]) 02/11/2018  . Asthma 04/30/2017  . Chronic reflux esophagitis 04/30/2017  . Hyperlipemia 04/30/2017  . Osteoporosis 04/30/2017    Orientation RESPIRATION BLADDER Height & Weight     Self, Time, Situation, Place  O2(2 Liters Oxygen. ) Continent Weight: 63.8 kg (140 lb 11.2 oz) Height:  5\' 9"  (175.3 cm)  BEHAVIORAL SYMPTOMS/MOOD NEUROLOGICAL BOWEL NUTRITION STATUS      Continent Diet(Diet: NPO for surgery to be advanced. )  AMBULATORY STATUS COMMUNICATION OF NEEDS Skin   Extensive Assist Verbally Surgical wounds                       Personal Care Assistance Level of Assistance  Bathing, Feeding, Dressing Bathing Assistance: Limited assistance Feeding assistance: Independent Dressing Assistance: Limited assistance     Functional Limitations Info  Sight, Hearing, Speech Sight Info: Adequate Hearing Info: Adequate Speech Info: Adequate    SPECIAL CARE FACTORS FREQUENCY  PT (By licensed PT), OT (By licensed OT)     PT Frequency: (5) OT Frequency: (5)            Contractures      Additional Factors Info  Code Status, Allergies Code Status Info: (Full Code. ) Allergies Info: (Codeine, Lidocaine)            Current Medications (02/11/2018):  This is the current hospital active medication list Current Facility-Administered Medications  Medication Dose Route Frequency Provider Last Rate Last Dose  . acetaminophen (TYLENOL) tablet 650 mg  650 mg Oral Q6H PRN Harrie Foreman, MD       Or  . acetaminophen (TYLENOL) suppository 650 mg  650 mg Rectal Q6H PRN Harrie Foreman, MD      . ceFAZolin (ANCEF) powder 1 g  1 g Other To OR Hessie Knows, MD      . diazepam (VALIUM) tablet 2 mg  2 mg Oral BID PRN Harrie Foreman, MD      . docusate sodium (COLACE) capsule 100 mg  100 mg Oral BID Harrie Foreman, MD      . fluticasone [name]) 50 MCG/ACT nasal spray 2 spray  2 spray Each Nare Daily Harrie Foreman, MD      . fluticasone furoate-vilanterol (BREO ELLIPTA) 200-25 MCG/INH 1 puff  1 puff Inhalation Daily Harrie Foreman, MD      . morphine 2 MG/ML injection 2-4 mg  2-4 mg Intravenous Q2H PRN Hessie Knows, MD   2 mg at 02/11/18 0900  . ondansetron (ZOFRAN) tablet 4 mg  4 mg Oral Q6H PRN Harrie Foreman, MD       Or  . ondansetron [facility]) injection 4 mg  4 mg Intravenous Q6H PRN Harrie Foreman, MD      .  PARoxetine (PAXIL) tablet 10 mg  10 mg Oral Daily Harrie Foreman, MD         Discharge Medications: Please see discharge summary for a list of discharge medications.  Relevant Imaging Results:  Relevant Lab Results:   Additional Information (SSN: [id number])  Kingstin Heims, Veronia Beets, LCSW

## 2018-02-11 NOTE — Clinical Social Work Note (Addendum)
Clinical Social Work Assessment  Patient Details  Name: Teresa Chambers MRN: [id number] Date of Birth: [birth year]  Date of referral:  02/11/18               Reason for consult:  Facility Placement                Permission sought to share information with:  Chartered certified accountant granted to share information::  Yes, Verbal Permission Granted  Name::      Teresa Chambers::   Teresa Chambers   Relationship::     Contact Information:     Housing/Transportation Living arrangements for the past 2 months:  [place] of Information:  Patient, Adult Children Patient Interpreter Needed:  None Criminal Activity/Legal Involvement Pertinent to Current Situation/Hospitalization:  No - Comment as needed Significant Relationships:  Adult Children Lives with:  Self Do you feel safe going back to the place where you live?  Yes Need for family participation in patient care:  Yes (Comment)  Care giving concerns:  Patient lives alone in [place].    Social Worker assessment / plan:  Holiday representative ([place]) reviewed chart and noted that patient has a hip fracture. Surgery and PT are pending. CSW met with patient and her daughter Teresa Chambers [phone] was at bedside. Patient was alert and oriented X4 and was laying in the bed. CSW introduced self and explained role of CSW department. Per patient she lives alone in [place]. CSW explained that after surgery PT will evaluate patient and make a recommendation of home health or SNF. CSW explained that Health Team will have to approve SNF. Patient is agreeable to SNF is needed. CSW provided [facility] SNF list to daughter. FL2 complete and faxed out. CSW will continue to follow and assist as needed.    Son is law Teresa Chambers [phone].   Employment status:  Retired Nurse, adult PT Recommendations:  Not assessed at this time [place] / Referral to community  resources:  [place]  Patient/Family's Response to care:  Patient and daughter are agreeable to SNF search in [place].   Patient/Family's Understanding of and Emotional Response to Diagnosis, Current Treatment, and Prognosis:  Patient and her daughter were very pleasant and thanked CSW for assistance.   Emotional Assessment Appearance:  Appears stated age Attitude/Demeanor/Rapport:    Affect (typically observed):  Accepting, Adaptable, Pleasant Orientation:  Oriented to Self, Oriented to Place, Oriented to  Time, Oriented to Situation Alcohol / Substance use:  Not Applicable Psych involvement (Current and /or in the community):  No (Comment)  Discharge Needs  Concerns to be addressed:  Discharge Planning Concerns Readmission within the last 30 days:  No Current discharge risk:  Dependent with Mobility Barriers to Discharge:  Continued Medical Work up   [employer], Teresa Beets, LCSW 02/11/2018, 12:21 PM

## 2018-02-11 NOTE — Clinical Social Work Placement (Signed)
   CLINICAL SOCIAL WORK PLACEMENT  NOTE  Date:  02/11/2018  Patient Details  Name: Teresa Chambers MRN: [id number] Date of Birth: 1936/02/01  Clinical Social Work is seeking post-discharge placement for this patient at the [place] level of care (*CSW will initial, date and re-position this form in  chart as items are completed):  Yes   Patient/family provided with [place] Work Department's list of facilities offering this level of care within the geographic area requested by the patient (or if unable, by the patient's family).  Yes   Patient/family informed of their freedom to choose among providers that offer the needed level of care, that participate in Medicare, Medicaid or managed care program needed by the patient, have an available bed and are willing to accept the patient.  Yes   Patient/family informed of [facility]'s ownership interest in [facility] and [facility], as well as of the fact that they are under no obligation to receive care at these facilities.  PASRR submitted to EDS on 02/11/18     PASRR number received on 02/11/18     Existing PASRR number confirmed on       FL2 transmitted to all facilities in geographic area requested by pt/family on 02/11/18     FL2 transmitted to all facilities within larger geographic area on       Patient informed that his/her managed care company has contracts with or will negotiate with certain facilities, including the following:            Patient/family informed of bed offers received.  Patient chooses bed at       Physician recommends and patient chooses bed at      Patient to be transferred to   on  .  Patient to be transferred to facility by       Patient family notified on   of transfer.  Name of family member notified:        PHYSICIAN       Additional Comment:    _______________________________________________ Ronnell Makarewicz, Veronia Beets, LCSW 02/11/2018, 12:20 PM

## 2018-02-11 NOTE — Consult Note (Signed)
Patient is a 82 year old woman who fell out of bed last night denies any loss of consciousness.  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  she denies prodromal symptoms    On exam her right leg is slightly shortened and externally rotated.  She is able to flex extend the toes and has intact sensation.  Palpable dorsalis pedis pulse.  Radiographs show a posterior displaced femoral neck fracture subcapital without significant osteoarthritis  Impression is displaced femoral neck fracture  Recommendation is for hip hemiarthroplasty on the right side

## 2018-02-11 NOTE — Anesthesia Postprocedure Evaluation (Signed)
Anesthesia Post Note  Patient: Teresa Chambers  Procedure(s) Performed: ARTHROPLASTY BIPOLAR HIP (HEMIARTHROPLASTY) (Right )  Patient location during evaluation: PACU Anesthesia Type: Spinal Level of consciousness: awake and alert Pain management: pain level controlled Vital Signs Assessment: post-procedure vital signs reviewed and stable Respiratory status: spontaneous breathing, nonlabored ventilation, respiratory function stable and patient connected to nasal cannula oxygen Cardiovascular status: blood pressure returned to baseline and stable Postop Assessment: no apparent nausea or vomiting Anesthetic complications: no     Last Vitals:  Vitals:   02/11/18 1936 02/11/18 2033  BP: 119/68 (!) 109/59  Pulse: 79 78  Resp: 19 18  Temp: 37.2 C 37.1 C  SpO2: 98% 97%    Last Pain:  Vitals:   02/11/18 2033  TempSrc: Oral  PainSc:                  Molli Barrows

## 2018-02-11 NOTE — Progress Notes (Signed)
She is admitted this morning for fall, right hip fracture patient noted to have right femoral neck fracture, scheduled to have surgery later today by orthopedic. Labs reviewed they are essentially within normal range Vitals are stable #1 right total neck fracture: Continue pain medicines, start physical therapy and DVT prophylaxis after surgery. #2. depression: Continue antidepressants.  startCymbalta, DC Paxil as per home medicine.  #3 history of COPD: Continue inhalers, bronchodilators.  No wheezing today. Time spent; 20 minutes

## 2018-02-11 NOTE — H&P (Signed)
Teresa Chambers is an 82 y.o. female.   Chief Complaint: Fall HPI: The patient with past medical history of COPD, asthma and CHF presents the emergency department after suffering a fall.  The patient states that she was dreaming and rolled out of bed onto her hip.  She was unable to stand due to pain and had to crawl under her bed (which is very high) to reach the phone to call EMS.  X-ray of her painful hip in the emergency department revealed fracture of the femoral neck which prompted the emergency department staff to call the hospitalist service for admission.  Past Medical History:  Diagnosis Date  . Asthma   . CHF (congestive heart failure) ([place])   . COPD (chronic obstructive pulmonary disease) ([place])   . Palpitations     Past Surgical History:  Procedure Laterality Date  . CHOLECYSTECTOMY    . COLON SURGERY    . HERNIA REPAIR      No family history on file.  Patient into much pain to remember family history at this time.  Social History:  reports that she has never smoked. She has never used smokeless tobacco. She reports that she does not drink alcohol or use drugs.  Allergies:  Allergies  Allergen Reactions  . Codeine Nausea And Vomiting  . Lidocaine Swelling    Medications Prior to Admission  Medication Sig Dispense Refill  . albuterol (PROVENTIL HFA;VENTOLIN HFA) 108 (90 Base) MCG/ACT inhaler Inhale 2 puffs into the lungs every 6 (six) hours as needed for wheezing or shortness of breath. 1 Inhaler 2  . albuterol (PROVENTIL) (2.5 MG/3ML) 0.083% nebulizer solution Inhale 1 ampule into the lungs 4 (four) times daily.     . diazepam (VALIUM) 2 MG tablet Take 1 tablet by mouth 2 (two) times daily as needed.  0  . DULoxetine (CYMBALTA) 60 MG capsule Take 1 capsule by mouth 2 (two) times daily.  2  . enalapril (VASOTEC) 5 MG tablet Take 1 tablet by mouth daily.  5  . fluticasone (FLONASE) 50 MCG/ACT nasal spray Place 2 sprays into the nose daily.    . Fluticasone-Salmeterol  (ADVAIR) 250-50 MCG/DOSE AEPB Inhale 1 puff into the lungs 2 (two) times daily.    . montelukast (SINGULAIR) 10 MG tablet Take 1 tablet by mouth daily.  3  . omeprazole (PRILOSEC) 40 MG capsule Take 1 capsule by mouth daily.  3  . simvastatin (ZOCOR) 40 MG tablet Take 1 tablet by mouth daily.  3  . Fe Fum-Fe Poly-Vit C-Lactobac (FUSION) 65-65-25-30 MG CAPS Take 1 capsule by mouth daily.    Marland Kitchen Lifitegrast (XIIDRA) 5 % SOLN Apply to eye.    Marland Kitchen PARoxetine (PAXIL) 10 MG tablet Take 1 tablet by mouth daily.  3    Results for orders placed or performed during the hospital encounter of 02/11/18 (from the past 48 hour(s))  CBC     Status: Abnormal   Collection Time: 02/11/18  5:03 AM  Result Value Ref Range   WBC 10.6 3.6 - 11.0 K/uL   RBC 3.77 (L) 3.80 - 5.20 MIL/uL   Hemoglobin 12.8 12.0 - 16.0 g/dL   HCT 36.9 35.0 - 47.0 %   MCV 97.9 80.0 - 100.0 fL   MCH 33.9 26.0 - 34.0 pg   MCHC 34.6 32.0 - 36.0 g/dL   RDW 13.5 11.5 - 14.5 %   Platelets 234 150 - 440 K/uL    Comment: Performed at [facility], [address] [place], [place] [address]  Comprehensive metabolic panel     Status: Abnormal   Collection Time: 02/11/18  5:03 AM  Result Value Ref Range   Sodium 145 135 - 145 mmol/L   Potassium 3.7 3.5 - 5.1 mmol/L   Chloride 109 98 - 111 mmol/L   CO2 30 22 - 32 mmol/L   Glucose, Bld 112 (H) 70 - 99 mg/dL   BUN 11 8 - 23 mg/dL   Creatinine, Ser 0.69 0.44 - 1.00 mg/dL   Calcium 8.5 (L) 8.9 - 10.3 mg/dL   Total Protein 6.5 6.5 - 8.1 g/dL   Albumin 3.6 3.5 - 5.0 g/dL   AST 24 15 - 41 U/L   ALT 20 0 - 44 U/L   Alkaline Phosphatase 40 38 - 126 U/L   Total Bilirubin 0.6 0.3 - 1.2 mg/dL   GFR calc non Af Amer >60 >60 mL/min   GFR calc Af Amer >60 >60 mL/min    Comment: (NOTE) The eGFR has been calculated using the CKD EPI equation. This calculation has not been validated in all clinical situations. eGFR's persistently <60 mL/min signify possible Chronic Kidney Disease.     Anion gap 6 5 - 15    Comment: Performed at [facility], [address] [place], [place] [address]  Protime-INR     Status: None   Collection Time: 02/11/18  5:03 AM  Result Value Ref Range   Prothrombin Time 13.2 11.4 - 15.2 seconds   INR 1.01     Comment: Performed at [facility], [address], [place], [place] [address]   Dg Chest 1 View  Result Date: 02/11/2018 CLINICAL DATA:  Patient rule out of bed landing on the right hip. Right hip fracture. EXAM: CHEST  1 VIEW COMPARISON:  01/20/2008 FINDINGS: Normal heart size and pulmonary vascularity. Large calcified granuloma in the left lung base. No airspace disease or consolidation. No blunting of costophrenic angles. No pneumothorax. Calcification of the aorta. IMPRESSION: No evidence of active pulmonary disease. Large calcified granuloma in the left lung base. Aortic atherosclerosis. Electronically Signed   By: Lucienne Capers M.D.   On: 02/11/2018 04:32   Dg Hip Unilat  With Pelvis 2-3 Views Right  Result Date: 02/11/2018 CLINICAL DATA:  Patient fell out of bed, landing on the right hip. EXAM: DG HIP (WITH OR WITHOUT PELVIS) 2-3V RIGHT COMPARISON:  None. FINDINGS: There is a transverse subcapital fracture of the right femoral neck with varus angulation and impaction of the fracture fragments. The pelvis appears intact. SI joints and symphysis pubis are not displaced. Degenerative changes in the lower lumbar spine and in the hips. Surgical clips consistent with mesh abdominal wall hernia repair. IMPRESSION: Subcapital fracture of the right femoral neck with varus angulation and impaction. Electronically Signed   By: Lucienne Capers M.D.   On: 02/11/2018 04:31    Review of Systems  Constitutional: Negative for chills and fever.  HENT: Negative for sore throat and tinnitus.   Eyes: Negative for blurred vision and redness.  Respiratory: Negative for cough and shortness of breath.   Cardiovascular: Negative for chest  pain, palpitations, orthopnea and PND.  Gastrointestinal: Negative for abdominal pain, diarrhea, nausea and vomiting.  Genitourinary: Negative for dysuria, frequency and urgency.  Musculoskeletal: Positive for joint pain. Negative for myalgias.  Skin: Negative for rash.       No lesions  Neurological: Negative for speech change, focal weakness and weakness.  Endo/Heme/Allergies: Does not bruise/bleed easily.  No temperature intolerance  Psychiatric/Behavioral: Negative for depression and suicidal ideas.    Blood pressure (!) 142/71, pulse 76, temperature 98.3 F (36.8 C), temperature source Oral, resp. rate 20, height '5\' 9"'$  (1.753 m), weight 78 kg (172 lb), SpO2 99 %. Physical Exam  Vitals reviewed. Constitutional: She is oriented to person, place, and time. She appears well-developed and well-nourished. No distress.  HENT:  Head: Normocephalic and atraumatic.  Mouth/Throat: Oropharynx is clear and moist.  Eyes: Pupils are equal, round, and reactive to light. Conjunctivae and EOM are normal. No scleral icterus.  Neck: Normal range of motion. Neck supple. No JVD present. No tracheal deviation present. No thyromegaly present.  Cardiovascular: Normal rate, regular rhythm and normal heart sounds. Exam reveals no gallop and no friction rub.  No murmur heard. Respiratory: Effort normal and breath sounds normal.  GI: Soft. Bowel sounds are normal. She exhibits no distension. There is no tenderness.  Genitourinary:  Genitourinary Comments: Deferred  Musculoskeletal: She exhibits deformity (shortened and rotated right leg). She exhibits no edema.  Lymphadenopathy:    She has no cervical adenopathy.  Neurological: She is alert and oriented to person, place, and time. No cranial nerve deficit. She exhibits normal muscle tone.  Skin: Skin is warm and dry. No rash noted. No erythema.  Psychiatric: She has a normal mood and affect. Her behavior is normal. Judgment and thought content normal.      Assessment/Plan This is a 82 year old female admitted for hip fracture. 1.  Hip fracture: Right subcapital fracture of femoral neck.  Manage severe pain with IV morphine.  The patient is n.p.o. for potential orthopedic repair in the morning. 2.  COPD: Controlled; continue inhaled corticosteroid with long-acting bronchodilator.  Albuterol as needed. 3.  CHF: The patient carries this diagnosis but she does not have cardiology notes for review nor echocardiogram results available at this time.  She is not on any diuretic therapy and has no evidence of decompensation. 4.  DVT prophylaxis: SCDs 5.  GI prophylaxis: None The patient is a full code.  Time spent on admission orders and patient care approximately 45 minutes  Harrie Foreman, MD 02/11/2018, 5:36 AM

## 2018-02-11 NOTE — Progress Notes (Signed)
Pt transferred to preop holding via hospital bed accompanied by family without incident per MD order. Pt pain tolerable on transfer. No change in patient from AM assessment. Prior to transfer rept called to [facility] RN in preop holding area.

## 2018-02-11 NOTE — ED Triage Notes (Signed)
Pt states she rolled out of bed approx 0200 landing on right hip. Pt with shortening noted to right leg. Ems gave 33mcg fentanyl. 20g int to left ac. Cms intact to feet.

## 2018-02-11 NOTE — ED Notes (Signed)
Patient transported to X-ray 

## 2018-02-11 NOTE — Anesthesia Procedure Notes (Addendum)
Spinal  Staffing Anesthesiologist: Alphonsus Sias, MD Performed: anesthesiologist  Preanesthetic Checklist Completed: patient identified, site marked, surgical consent, pre-op evaluation, timeout performed, IV checked, risks and benefits discussed and monitors and equipment checked Spinal Block Patient position: left lateral decubitus Prep: ChloraPrep Patient monitoring: heart rate, cardiac monitor, continuous pulse ox and blood pressure Approach: left paramedian Location: L2-3 Injection technique: single-shot Needle Needle gauge: 24 G Needle length: 10 cm Additional Notes Hypobaric 12.5 mg 0.375% bupivicaine (0.5% + sterile PF water). 3 attempts L3/4 & L2/3, clear CSF, easy aspirate

## 2018-02-11 NOTE — Transfer of Care (Signed)
Immediate Anesthesia Transfer of Care Note  Patient: Teresa Chambers  Procedure(s) Performed: ARTHROPLASTY BIPOLAR HIP (HEMIARTHROPLASTY) (Right )  Patient Location: PACU  Anesthesia Type:General  Level of Consciousness: awake  Airway & Oxygen Therapy: Patient Spontanous Breathing and Patient connected to face mask oxygen  Post-op Assessment: Report given to RN and Post -op Vital signs reviewed and stable  Post vital signs: Reviewed and stable  Last Vitals:  Vitals Value Taken Time  BP 99/54 02/11/2018  4:02 PM  Temp 37 C 02/11/2018  4:02 PM  Pulse 80 02/11/2018  4:04 PM  Resp 14 02/11/2018  4:04 PM  SpO2 100 % 02/11/2018  4:04 PM  Vitals shown include unvalidated device data.  Last Pain:  Vitals:   02/11/18 1602  TempSrc:   PainSc: 0-No pain      Patients Stated Pain Goal: 4 (89/78/47 8412)  Complications: No apparent anesthesia complications

## 2018-02-12 ENCOUNTER — Inpatient Hospital Stay: Payer: PPO

## 2018-02-12 ENCOUNTER — Encounter: Payer: Self-pay | Admitting: Surgery

## 2018-02-12 ENCOUNTER — Encounter
Admission: RE | Admit: 2018-02-12 | Discharge: 2018-02-12 | Disposition: A | Discharge status: Home / Self Care | Payer: PPO | Source: Ambulatory Visit | Attending: Internal Medicine | Admitting: Internal Medicine

## 2018-02-12 DIAGNOSIS — Z96649 Presence of unspecified artificial hip joint: Secondary | ICD-10-CM | POA: Insufficient documentation

## 2018-02-12 LAB — BASIC METABOLIC PANEL
ANION GAP: 5 (ref 5–15)
BUN: 11 mg/dL (ref 8–23)
CO2: 29 mmol/L (ref 22–32)
Calcium: 7.9 mg/dL — ABNORMAL LOW (ref 8.9–10.3)
Chloride: 106 mmol/L (ref 98–111)
Creatinine, Ser: 0.58 mg/dL (ref 0.44–1.00)
GFR calc non Af Amer: >60 mL/min (ref >60)
GLUCOSE: 131 mg/dL — AB (ref 70–99)
POTASSIUM: 4 mmol/L (ref 3.5–5.1)
Sodium: 140 mmol/L (ref 135–145)

## 2018-02-12 LAB — CBC WITH DIFFERENTIAL/PLATELET
BASOS PCT: 0 %
Basophils Absolute: 0 10*3/uL (ref 0–0.1)
Eosinophils Absolute: 0 10*3/uL (ref 0–0.7)
Eosinophils Relative: 0 %
HEMATOCRIT: 34.5 % — AB (ref 35.0–47.0)
HEMOGLOBIN: 12.2 g/dL (ref 12.0–16.0)
LYMPHS PCT: 8 %
Lymphs Abs: 0.9 10*3/uL — ABNORMAL LOW (ref 1.0–3.6)
MCH: 34.4 pg — ABNORMAL HIGH (ref 26.0–34.0)
MCHC: 35.4 g/dL (ref 32.0–36.0)
MCV: 97.2 fL (ref 80.0–100.0)
Monocytes Absolute: 0.7 10*3/uL (ref 0.2–0.9)
Monocytes Relative: 7 %
NEUTROS ABS: 9.3 10*3/uL — AB (ref 1.4–6.5)
NEUTROS PCT: 85 %
Platelets: 207 10*3/uL (ref 150–440)
RBC: 3.55 MIL/uL — AB (ref 3.80–5.20)
RDW: 13.4 % (ref 11.5–14.5)
WBC: 10.9 10*3/uL (ref 3.6–11.0)

## 2018-02-12 MED ORDER — OXYCODONE HCL 5 MG PO TABS
5.0000 mg | ORAL_TABLET | ORAL | Status: DC | PRN
  Administered 2018-02-12: 5 mg via ORAL
  Filled 2018-02-12: qty 1

## 2018-02-12 MED ORDER — GUAIFENESIN-DM 100-10 MG/5ML PO SYRP
5.0000 mL | ORAL_SOLUTION | Freq: Four times a day (QID) | ORAL | Status: DC | PRN
  Administered 2018-02-12: 5 mL via ORAL
  Filled 2018-02-12: qty 5

## 2018-02-12 MED ORDER — ALBUTEROL SULFATE (2.5 MG/3ML) 0.083% IN NEBU
2.5000 mg | INHALATION_SOLUTION | RESPIRATORY_TRACT | Status: DC | PRN
  Administered 2018-02-12: 2.5 mg via RESPIRATORY_TRACT
  Filled 2018-02-12: qty 3

## 2018-02-12 MED ORDER — SODIUM CHLORIDE 0.9 % IV BOLUS: 250.0000 mL | Freq: Once | INTRAVENOUS | Status: DC

## 2018-02-12 NOTE — Progress Notes (Signed)
Physical Therapy Treatment Patient Details Name: Teresa Chambers MRN: [id number] DOB: October 19, 1935 Today's Date: 02/12/2018    History of Present Illness Pt is a 82 y.o. female presenting to hospital 02/11/18 s/p fall OOB.  Imaging showing subcapital fx of R femoral neck.  Pt s/p R hip unipolar hemiarthroplasty 02/11/18.  PMH includes COPD, asthma, CHF, hernia repair.    PT Comments    Pt able to progress to ambulating 15 feet with RW CGA to min assist x1.  Vc's required for gait pattern initially (pt with difficulty advancing B LE's) but improved gait technique noted with practice and cueing.  Distance limited d/t SOB (pt's O2 sats 97% at rest beginning of session and 92% post ambulation all on room air).  Pain 3/10 R hip beginning/end of session.  Pt requiring cueing for posterior hip precautions with functional mobility.  Will continue to progress pt with strengthening, balance, and progressive functional mobility per pt tolerance.    Follow Up Recommendations  SNF     Equipment Recommendations  Rolling walker with 5" wheels    Recommendations for Other Services OT consult     Precautions / Restrictions Precautions Precautions: Fall;Posterior Hip Precaution Booklet Issued: Yes (comment) Restrictions Weight Bearing Restrictions: Yes RLE Weight Bearing: Weight bearing as tolerated    Mobility  Bed Mobility Overal bed mobility: Needs Assistance Bed Mobility: Sit to Supine     Sit to supine: Min assist;Mod assist;+2 for physical assistance;HOB elevated   General bed mobility comments: assist for B LE's and trunk sit to supine; vc's for technique; 2 assist to boost pt up in bed  Transfers Overall transfer level: Needs assistance Equipment used: Rolling walker (2 wheeled) Transfers: Sit to/from Stand Sit to Stand: Mod assist         General transfer comment: x2 attempts to stand; vc's for UE and LE placement and posterior hip precautions; increased effort and time to initiate  stand and come to upright posture  Ambulation/Gait Ambulation/Gait assistance: Min guard;Min assist Gait Distance (Feet): 15 Feet Assistive device: Rolling walker (2 wheeled)   Gait velocity: decreased   General Gait Details: decreased stance time R LE; antalgic; difficulty advancing B LE's initially and pt standing on forefoot on R; with cueing for R foot flat pt's gait pattern improved and pt able to advance L LE more easily; vc's for walker use and to increase UE support through RW; limited d/t SOB with activity   Stairs             Wheelchair Mobility    Modified Rankin (Stroke Patients Only)       Balance Overall balance assessment: Needs assistance Sitting-balance support: No upper extremity supported;Feet supported Sitting balance-Leahy Scale: Fair Sitting balance - Comments: steady static sitting   Standing balance support: Bilateral upper extremity supported Standing balance-Leahy Scale: Poor Standing balance comment: requires B UE support for static standing balance                            Cognition Arousal/Alertness: Awake/alert Behavior During Therapy: WFL for tasks assessed/performed Overall Cognitive Status: Within Functional Limits for tasks assessed                                        Exercises     General Comments General comments (skin integrity, edema, etc.): R hip dressing intact.  Pt agreeable to PT session.      Pertinent Vitals/Pain Pain Assessment: 0-10 Pain Score: 3  Pain Location: R hip Pain Descriptors / Indicators: Sore Pain Intervention(s): Limited activity within patient's tolerance;Monitored during session;Premedicated before session;Repositioned  HR WFL during session.    Home Living Family/patient expects to be discharged to:: Private residence Living Arrangements: Alone Available Help at Discharge: Family Type of Home: (Townhouse) Home Access: Stairs to enter Entrance Stairs-Rails:  Right;Left Home Layout: One level Home Equipment: Civil engineer, contracting - built in      Prior Function Level of Independence: Independent      Comments: (+) driving; 1 other fall in past 6 months (pt's dog pulled her down)   PT Goals (current goals can now be found in the care plan section) Acute Rehab PT Goals Patient Stated Goal: to be able to walk again PT Goal Formulation: With patient Time For Goal Achievement: 02/26/18 Potential to Achieve Goals: Good Progress towards PT goals: Progressing toward goals    Frequency    BID      PT Plan Current plan remains appropriate    Co-evaluation              AM-PAC PT "6 Clicks" Daily Activity  Outcome Measure  Difficulty turning over in bed (including adjusting bedclothes, sheets and blankets)?: A Lot Difficulty moving from lying on back to sitting on the side of the bed? : Unable Difficulty sitting down on and standing up from a chair with arms (e.g., wheelchair, bedside commode, etc,.)?: Unable Help needed moving to and from a bed to chair (including a wheelchair)?: A Lot Help needed walking in hospital room?: A Lot Help needed climbing 3-5 steps with a railing? : Total 6 Click Score: 9    End of Session Equipment Utilized During Treatment: Gait belt Activity Tolerance: Patient limited by fatigue;Other (comment)(Limited d/t SOB) Patient left: in bed;with call bell/phone within reach;with bed alarm set;with family/visitor present;with SCD's reapplied;Other (comment)(hip abduction pillow in place; B heels elevated via towel rolls) Nurse Communication: Mobility status;Precautions;Weight bearing status;Other (comment)(Pt's pain and O2 status during session) PT Visit Diagnosis: Other abnormalities of gait and mobility (R26.89);Muscle weakness (generalized) (M62.81);History of falling (Z91.81);Difficulty in walking, not elsewhere classified (R26.2);Pain Pain - Right/Left: Right Pain - part of body: Hip     Time: 9485-4627 PT  Time Calculation (min) (ACUTE ONLY): 30 min  Charges:  $Gait Training: 8-22 mins $Therapeutic Activity: 8-22 mins                    Leitha Bleak, PT 02/12/18, 2:56 PM [phone]

## 2018-02-12 NOTE — Progress Notes (Signed)
BP 99/57 and has been running low, informed MD, new orders for a 250 cc NS bolus.

## 2018-02-12 NOTE — Progress Notes (Signed)
[place] [facility] [place] NAME: Teresa Chambers    MR#:  [id number]  DATE OF BIRTH:  1935/09/05  SUBJECTIVE:  CHIEF COMPLAINT: Patient is resting comfortably.  Pain is manageable and denies any complaints.  Foley catheter discontinued.  No bowel movement yet  REVIEW OF SYSTEMS:  CONSTITUTIONAL: No fever, fatigue or weakness.  EYES: No blurred or double vision.  EARS, NOSE, AND THROAT: No tinnitus or ear pain.  RESPIRATORY: No cough, shortness of breath, wheezing or hemoptysis.  CARDIOVASCULAR: No chest pain, orthopnea, edema.  GASTROINTESTINAL: No nausea, vomiting, diarrhea or abdominal pain.  GENITOURINARY: No dysuria, hematuria.  ENDOCRINE: No polyuria, nocturia,  HEMATOLOGY: No anemia, easy bruising or bleeding SKIN: No rash or lesion. MUSCULOSKELETAL: Right hip pain NEUROLOGIC: No tingling, numbness, weakness.  PSYCHIATRY: No anxiety or depression.   DRUG ALLERGIES:   Allergies  Allergen Reactions  . Codeine Nausea And Vomiting  . Lidocaine Swelling    VITALS:  Blood pressure (!) 109/59, pulse 72, temperature 98 F (36.7 C), temperature source Oral, resp. rate 18, height 5\' 9"  (1.753 m), weight 63.8 kg (140 lb 11.2 oz), SpO2 95 %.  PHYSICAL EXAMINATION:  GENERAL:  82 y.o.-year-old patient lying in the bed with no acute distress.  EYES: Pupils equal, round, reactive to light and accommodation. No scleral icterus. Extraocular muscles intact.  HEENT: Head atraumatic, normocephalic. Oropharynx and nasopharynx clear.  NECK:  Supple, no jugular venous distention. No thyroid enlargement, no tenderness.  LUNGS: Normal breath sounds bilaterally, no wheezing, rales,rhonchi or crepitation. No use of accessory muscles of respiration.  CARDIOVASCULAR: S1, S2 normal. No murmurs, rubs, or gallops.  ABDOMEN: Soft, nontender, nondistended. Bowel sounds present.  EXTREMITIES: Right hip status post surgery with clean honey comb dressing no pedal  edema, cyanosis, or clubbing.  NEUROLOGIC: Cranial nerves II through XII are intact. Sensation intact. Gait not checked.  PSYCHIATRIC: The patient is alert and oriented x 3.  SKIN: No obvious rash, lesion, or ulcer.    LABORATORY PANEL:   CBC Recent Labs  Lab 02/12/18 0348  WBC 10.9  HGB 12.2  HCT 34.5*  PLT 207   ------------------------------------------------------------------------------------------------------------------  Chemistries  Recent Labs  Lab 02/11/18 0503 02/12/18 0348  NA 145 140  K 3.7 4.0  CL 109 106  CO2 30 29  GLUCOSE 112* 131*  BUN 11 11  CREATININE 0.69 0.58  CALCIUM 8.5* 7.9*  AST 24  --   ALT 20  --   ALKPHOS 40  --   BILITOT 0.6  --    ------------------------------------------------------------------------------------------------------------------  Cardiac Enzymes No results for input(s): TROPONINI in the last 168 hours. ------------------------------------------------------------------------------------------------------------------  RADIOLOGY:  Dg Chest 1 View  Result Date: 02/11/2018 CLINICAL DATA:  Patient rule out of bed landing on the right hip. Right hip fracture. EXAM: CHEST  1 VIEW COMPARISON:  01/20/2008 FINDINGS: Normal heart size and pulmonary vascularity. Large calcified granuloma in the left lung base. No airspace disease or consolidation. No blunting of costophrenic angles. No pneumothorax. Calcification of the aorta. IMPRESSION: No evidence of active pulmonary disease. Large calcified granuloma in the left lung base. Aortic atherosclerosis. Electronically Signed   By: Lucienne Capers M.D.   On: 02/11/2018 04:32   Dg Chest 2 View  Result Date: 02/12/2018 CLINICAL DATA:  Hip surgery cough EXAM: CHEST - 2 VIEW COMPARISON:  02/11/2018, 01/19/2018 FINDINGS: Hyperinflation. No acute consolidation or effusion. Stable cardiomediastinal silhouette with aortic atherosclerosis. No pneumothorax. Stable calcified lung nodule at the  left  base. IMPRESSION: No active cardiopulmonary disease. Electronically Signed   By: Donavan Foil M.D.   On: 02/12/2018 20:13   Dg Hip Unilat W Or W/o Pelvis 2-3 Views Right  Result Date: 02/11/2018 CLINICAL DATA:  Initial evaluation status post right hip replacement. EXAM: DG HIP (WITH OR WITHOUT PELVIS) 2-3V RIGHT COMPARISON:  Prior radiograph from 02/11/2018 FINDINGS: Interval placement of a right total hip arthroplasty. Acetabular and femoral components appear well seated without adverse features. No periprosthetic fracture or other complication. Postoperative swelling and emphysema overlies the right hip. Degenerative changes noted at the left hip. IMPRESSION: Postoperative changes from interval right total hip arthroplasty without complication. Electronically Signed   By: Jeannine Boga M.D.   On: 02/11/2018 16:59   Dg Hip Unilat  With Pelvis 2-3 Views Right  Result Date: 02/11/2018 CLINICAL DATA:  Patient fell out of bed, landing on the right hip. EXAM: DG HIP (WITH OR WITHOUT PELVIS) 2-3V RIGHT COMPARISON:  None. FINDINGS: There is a transverse subcapital fracture of the right femoral neck with varus angulation and impaction of the fracture fragments. The pelvis appears intact. SI joints and symphysis pubis are not displaced. Degenerative changes in the lower lumbar spine and in the hips. Surgical clips consistent with mesh abdominal wall hernia repair. IMPRESSION: Subcapital fracture of the right femoral neck with varus angulation and impaction. Electronically Signed   By: Lucienne Capers M.D.   On: 02/11/2018 04:31    EKG:   Orders placed or performed during the hospital encounter of 02/11/18  . EKG 12-Lead  . EKG 12-Lead    ASSESSMENT AND PLAN:    This is a 82 year old female admitted for hip fracture.  1.  Hip fracture: Right subcapital fracture of femoral neck.    Postop day #1  Postop care and pain management by Ortho Foley catheter discontinued  2.  COPD: Controlled;  continue inhaled corticosteroid with long-acting bronchodilator.  Albuterol as needed.  3.  CHF: The patient carries this diagnosis but she does not have cardiology notes for review nor echocardiogram results available at this time.  She is not on any diuretic therapy and has no evidence of decompensation.  We will get an echocardiogram  4.  DVT prophylaxis: SCDs  5.  GI prophylaxis: None   Disposition skilled nursing facility   All the records are reviewed and case discussed with Care Management/Social Workerr. Management plans discussed with the patient, family and they are in agreement.  CODE STATUS: fc  TOTAL TIME TAKING CARE OF THIS PATIENT: 36 minutes.   POSSIBLE D/C IN 2 DAYS, DEPENDING ON CLINICAL CONDITION.  Note: This dictation was prepared with Dragon dictation along with smaller phrase technology. Any transcriptional errors that result from this process are unintentional.   Nicholes Mango M.D on 02/12/2018 at 10:02 PM  Between 7am to 6pm - Pager - [phone] After 6pm go to [email or web address] - password [email or web address] [facility]  Office  [phone]  CC: Primary care physician; Cletis Athens, MD

## 2018-02-12 NOTE — Progress Notes (Signed)
[facility] authorization has been received, authorization # [id number]. Per [facility] admissions coordinator at [facility] patient can come to [facility] over the weekend to room 207-B. Patient is aware of above.   [employer], LCSW [phone]

## 2018-02-12 NOTE — Evaluation (Signed)
Occupational Therapy Evaluation Patient Details Name: Teresa Chambers MRN: [id number] DOB: Jan 27, 1936 Today's Date: 02/12/2018    History of Present Illness Pt is a 82 y.o. female presenting to hospital 02/11/18 s/p fall OOB.  Imaging showing subcapital fx of R femoral neck.  Pt s/p R hip unipolar hemiarthroplasty 02/11/18.  PMH includes COPD, asthma, CHF, hernia repair.   Clinical Impression   Pt seen for OT evaluation this date, POD#1 from above surgery. Pt was independent in all ADLs prior to surgery with only fall leading to current hospitalization within the past 12 months. Pt is eager to return to PLOF with less pain and improved safety and independence. Pt currently requires MIN-MOD assist for LB dressing and bathing while in seated position due to pain and limited AROM of R hip while maintaining posterior total hip precautions. Pt able to recall 2/3 posterior total hip precautions at start of session and required verbal cues for how to implement during ADL and mobility. Pt/family instructed in posterior total hip precautions and how to implement, self care skills, falls prevention strategies with pet care considerations, home/routines modifications, DME/AE for LB bathing and dressing tasks, and visual demo of car transfer techniques. Will continue functional ADL mobility efforts next session; pt fatigued after recent therapy session and recently returned to bed. Pt would benefit from additional instruction in self care skills and techniques to help maintain precautions with or without assistive devices to support recall and carryover prior to discharge. Recommend STR upon discharge.       Follow Up Recommendations  SNF    Equipment Recommendations  Other (comment)(TBd)    Recommendations for Other Services       Precautions / Restrictions Precautions Precautions: Fall;Posterior Hip Precaution Booklet Issued: Yes (comment) Precaution Comments: Pt able to recall 2/3 requiring a verbal cue  to remember the 3rd. Additional instruction provided to support recall and carryover. Restrictions Weight Bearing Restrictions: Yes RLE Weight Bearing: Weight bearing as tolerated      Mobility Bed Mobility   General bed mobility comments: pt declined, recently back to bed  Transfers     General transfer comment: pt declined, recently back to bed    Balance                           ADL either performed or assessed with clinical judgement   ADL Overall ADL's : Needs assistance/impaired Eating/Feeding: Sitting;Independent   Grooming: Sitting;Independent   Upper Body Bathing: Sitting;Supervision/ safety   Lower Body Bathing: Sit to/from stand;Moderate assistance;Minimal assistance Lower Body Bathing Details (indicate cue type and reason): pt/family instructed in modified techniques and AE to perform in order to maintain precautions Upper Body Dressing : Sitting;Supervision/safety   Lower Body Dressing: Sit to/from stand;Minimal assistance;Moderate assistance Lower Body Dressing Details (indicate cue type and reason): pt/family instructed in modified techniques and AE to perform in order to maintain precautions                     Vision Patient Visual Report: No change from baseline       Perception     Praxis      Pertinent Vitals/Pain Pain Assessment: 0-10 Pain Score: 3  Pain Location: R hip Pain Descriptors / Indicators: Sore Pain Intervention(s): Limited activity within patient's tolerance;Monitored during session;Repositioned     Hand Dominance Right   Extremity/Trunk Assessment Upper Extremity Assessment Upper Extremity Assessment: Generalized weakness   Lower Extremity Assessment Lower Extremity  Assessment: Defer to PT evaluation   Cervical / Trunk Assessment Cervical / Trunk Assessment: Normal   Communication Communication Communication: No difficulties   Cognition Arousal/Alertness: Awake/alert Behavior During Therapy: WFL  for tasks assessed/performed Overall Cognitive Status: Within Functional Limits for tasks assessed                                     General Comments  R hip dressing intact    Exercises Other Exercises Other Exercises: pt/family instructed in falls prevention strategies including pet care considerations to minimize falls risk Other Exercises: pt/family instructed in AE for LB ADL to maintain precautions Other Exercises: pt/family instructed in posterior THPs to maximize recall and carryover into mobility and ADL tasks   Shoulder Instructions      Home Living Family/patient expects to be discharged to:: Private residence Living Arrangements: Alone Available Help at Discharge: Family Type of Home: House(Townhouse) Home Access: Stairs to enter [employer] of Steps: 6 Entrance Stairs-Rails: Right;Left Home Layout: One level     Bathroom Shower/Tub: Tub/shower unit;Walk-in Psychologist, prison and probation services: Standard     Home Equipment: Shower seat - built in          Prior Functioning/Environment Level of Independence: Independent        Comments: (+) driving; 1 other fall in past 6 months (pt's dog pulled her down)        OT Problem List: Decreased strength;Decreased knowledge of use of DME or AE;Decreased range of motion;Decreased knowledge of precautions;Pain;Impaired balance (sitting and/or standing)      OT Treatment/Interventions: Self-care/ADL training;Balance training;Therapeutic exercise;Therapeutic activities;DME and/or AE instruction;Patient/family education;Energy conservation    OT Goals(Current goals can be found in the care plan section) Acute Rehab OT Goals Patient Stated Goal: to be able to walk again OT Goal Formulation: With patient/family Time For Goal Achievement: 02/26/18 Potential to Achieve Goals: Good ADL Goals Pt Will Perform Lower Body Dressing: with caregiver independent in assisting;sit to/from stand;with adaptive  equipment Pt Will Transfer to Toilet: with supervision;ambulating(BSC over commode, LRAD for amb) Additional ADL Goal #1: Pt will verbalize 100% of posterior total hip precautions and how to implement during mobility and ADL tasks to maximize safety while recovering.  OT Frequency: Min 1X/week   Barriers to D/C: Decreased caregiver support;Inaccessible home environment          Co-evaluation              AM-PAC PT "6 Clicks" Daily Activity     Outcome Measure Help from another person eating meals?: None Help from another person taking care of personal grooming?: None Help from another person toileting, which includes using toliet, bedpan, or urinal?: A Lot Help from another person bathing (including washing, rinsing, drying)?: A Lot Help from another person to put on and taking off regular upper body clothing?: A Little Help from another person to put on and taking off regular lower body clothing?: A Lot 6 Click Score: 17   End of Session    Activity Tolerance: Patient tolerated treatment well Patient left: in bed;with call bell/phone within reach;with bed alarm set;with family/visitor present;with SCD's reapplied  OT Visit Diagnosis: Other abnormalities of gait and mobility (R26.89);Pain Pain - Right/Left: Right Pain - part of body: Hip                Time: 1422-1440 OT Time Calculation (min): 18 min Charges:  OT General Charges $  OT Visit: 1 Visit OT Evaluation $OT Eval Low Complexity: 1 Low OT Treatments $Self Care/Home Management : 8-22 mins  Jeni Salles, MPH, MS, OTR/L ascom [phone] 02/12/18, 3:26 PM

## 2018-02-12 NOTE — Evaluation (Signed)
Physical Therapy Evaluation Patient Details Name: Teresa Chambers MRN: [id number] DOB: 28-Oct-1935 Today's Date: 02/12/2018   History of Present Illness  Pt is a 82 y.o. female presenting to hospital 02/11/18 s/p fall OOB.  Imaging showing subcapital fx of R femoral neck.  Pt s/p R hip unipolar hemiarthroplasty 02/11/18.  PMH includes COPD, asthma, CHF, hernia repair.  Clinical Impression  Prior to hospital admission, pt was independent with ambulation/functional mobility.  Pt lives alone in 1 level home with 6 steps to enter with 2 railings.  Currently pt is min to mod assist with bed mobility; max assist to stand with RW (with multiple attempts), and min assist to walk a few feet bed to recliner with RW.  Pt reporting R LE was her stronger leg prior to this and had difficulty with standing d/t now needing to use L LE more.  Pain up to 5/10 R hip with activity but 1/10 beginning/end of session.  Pt reports h/o COPD but no home O2 use.  Pt on room air upon PT arrival (O2 sats 90-91% at rest) and pt noted to be SOB after sitting down in chair (O2 sats noted to be 83% on room air but O2 increased to 90% within 30 seconds of pursed lip breathing and after a couple more minutes increased to 92% on room air); nursing notified regarding O2 sats.  Pt would benefit from skilled PT to address noted impairments and functional limitations (see below for any additional details).  Upon hospital discharge, recommend pt discharge to [place].    Follow Up Recommendations SNF    Equipment Recommendations  Rolling walker with 5" wheels    Recommendations for Other Services OT consult     Precautions / Restrictions Precautions Precautions: Fall;Posterior Hip Restrictions Weight Bearing Restrictions: Yes RLE Weight Bearing: Weight bearing as tolerated      Mobility  Bed Mobility Overal bed mobility: Needs Assistance Bed Mobility: Supine to Sit     Supine to sit: Mod assist;HOB elevated     General bed  mobility comments: assist for R LE and to scoot to edge of bed using bed pad; pt using B UE's on railing to assist; increased time to perform  Transfers Overall transfer level: Needs assistance Equipment used: Rolling walker (2 wheeled) Transfers: Sit to/from Stand Sit to Stand: Max assist         General transfer comment: vc's for UE and LE placement and posterior hip precautions; x3 attempts to stand with 1 assist; increased effort and assist to initate stand and come to upright position  Ambulation/Gait Ambulation/Gait assistance: Min assist Gait Distance (Feet): 3 Feet(bed to recliner) Assistive device: Rolling walker (2 wheeled)   Gait velocity: decreased   General Gait Details: decreased stance time R LE; antalgic; difficulty advancing B LE's; vc's for walker use and to increase UE support through RW; limited d/t SOB with activity  Stairs            Wheelchair Mobility    Modified Rankin (Stroke Patients Only)       Balance Overall balance assessment: Needs assistance Sitting-balance support: No upper extremity supported;Feet supported Sitting balance-Leahy Scale: Fair Sitting balance - Comments: steady static sitting   Standing balance support: Bilateral upper extremity supported Standing balance-Leahy Scale: Poor Standing balance comment: requires B UE support for static standing balance  Pertinent Vitals/Pain Pain Assessment: 0-10 Pain Score: 1 (5/10 with activity; 1/10 beginning/end of session at rest) Pain Location: R hip Pain Descriptors / Indicators: Sore Pain Intervention(s): Limited activity within patient's tolerance;Monitored during session;Premedicated before session;Repositioned  HR WFL during session    Home Living Family/patient expects to be discharged to:: Private residence Living Arrangements: Alone Available Help at Discharge: Family Type of Home: (Townhouse) Home Access: Stairs to  enter Entrance Stairs-Rails: Psychiatric nurse of Steps: 6 Home Layout: One level Home Equipment: Shower seat - built in      Prior Function Level of Independence: Independent         Comments: (+) driving; 1 other fall in past 6 months (pt's dog pulled her down)     Hand Dominance        Extremity/Trunk Assessment   Upper Extremity Assessment Upper Extremity Assessment: Generalized weakness    Lower Extremity Assessment Lower Extremity Assessment: RLE deficits/detail;LLE deficits/detail RLE Deficits / Details: hip flexion 2+/5 (limited d/t pain); knee flexion/extension at least 3/5; DF at least 3/5 LLE Deficits / Details: hip flexion, knee flexion/extension, and DF at least 4/5    Cervical / Trunk Assessment Cervical / Trunk Assessment: Normal  Communication   Communication: No difficulties  Cognition Arousal/Alertness: Awake/alert Behavior During Therapy: WFL for tasks assessed/performed Overall Cognitive Status: Within Functional Limits for tasks assessed                                        General Comments General comments (skin integrity, edema, etc.): R hip incision intact; minimal drainage noted.  Nursing cleared pt for participation in physical therapy.  Pt agreeable to PT session.    Exercises Total Joint Exercises Ankle Circles/Pumps: AROM;Strengthening;Both;10 reps;Supine Quad Sets: AROM;Strengthening;Both;10 reps;Supine Gluteal Sets: AROM;Strengthening;Both;10 reps;Supine Towel Squeeze: AROM;Strengthening;Both;10 reps;Supine Short Arc Quad: AROM;Strengthening;Right;10 reps;Supine Heel Slides: AAROM;Strengthening;Right;10 reps;Supine Hip ABduction/ADduction: AAROM;Strengthening;Right;10 reps;Supine   Assessment/Plan    PT Assessment Patient needs continued PT services  PT Problem List Decreased strength;Decreased activity tolerance;Decreased balance;Decreased mobility;Decreased knowledge of use of DME;Decreased  knowledge of precautions;Cardiopulmonary status limiting activity;Pain       PT Treatment Interventions DME instruction;Gait training;Stair training;Functional mobility training;Therapeutic activities;Therapeutic exercise;Balance training;Patient/family education    PT Goals (Current goals can be found in the Care Plan section)  Acute Rehab PT Goals Patient Stated Goal: to be able to walk again PT Goal Formulation: With patient Time For Goal Achievement: 02/26/18 Potential to Achieve Goals: Good    Frequency BID   Barriers to discharge Decreased caregiver support      Co-evaluation               AM-PAC PT "6 Clicks" Daily Activity  Outcome Measure                  End of Session Equipment Utilized During Treatment: Gait belt Activity Tolerance: Patient limited by fatigue Patient left: in chair;with call bell/phone within reach;with chair alarm set;with SCD's reapplied;Other (comment)(pillows placed between pt's knees for posterior hip precautions; B heels elevated via towel rolls) Nurse Communication: Mobility status;Precautions;Weight bearing status;Other (comment)(Pt's pain and O2 status during session) PT Visit Diagnosis: Other abnormalities of gait and mobility (R26.89);Muscle weakness (generalized) (M62.81);History of falling (Z91.81);Difficulty in walking, not elsewhere classified (R26.2);Pain Pain - Right/Left: Right Pain - part of body: Hip    Time: 5188-4166 PT Time Calculation (min) (ACUTE ONLY): 46 min   Charges:  PT Evaluation $PT Eval Low Complexity: 1 Low PT Treatments $Therapeutic Exercise: 8-22 mins $Therapeutic Activity: 8-22 mins       Leitha Bleak, PT 02/12/18, 11:24 AM [phone]

## 2018-02-12 NOTE — Progress Notes (Signed)
  Subjective: 1 Day Post-Op Procedure(s) (LRB): ARTHROPLASTY BIPOLAR HIP (HEMIARTHROPLASTY) (Right) Patient reports pain as mild.   Patient is well, and has had no acute complaints or problems PT and care management to assist with discharge planning. Negative for chest pain and shortness of breath Fever: no Gastrointestinal:Negative for nausea and vomiting Low BP this AM, will plan on small IV bolus prior to working with PT.  Objective: Vital signs in last 24 hours: Temp:  [97.4 F (36.3 C)-99 F (37.2 C)] 97.6 F (36.4 C) (07/26 0630) Pulse Rate:  [71-90] 71 (07/26 0630) Resp:  [15-32] 19 (07/25 2133) BP: (97-144)/(49-78) 97/61 (07/26 0630) SpO2:  [95 %-100 %] 97 % (07/26 0630)  Intake/Output from previous day:  Intake/Output Summary (Last 24 hours) at 02/12/2018 0749 Last data filed at 02/12/2018 0612 Gross per 24 hour  Intake 2153.75 ml  Output 1450 ml  Net 703.75 ml    Intake/Output this shift: No intake/output data recorded.  Labs: Recent Labs    02/11/18 0503 02/12/18 0348  HGB 12.8 12.2   Recent Labs    02/11/18 0503 02/12/18 0348  WBC 10.6 10.9  RBC 3.77* 3.55*  HCT 36.9 34.5*  PLT 234 207   Recent Labs    02/11/18 0503 02/12/18 0348  NA 145 140  K 3.7 4.0  CL 109 106  CO2 30 29  BUN 11 11  CREATININE 0.69 0.58  GLUCOSE 112* 131*  CALCIUM 8.5* 7.9*   Recent Labs    02/11/18 0503  INR 1.01     EXAM General - Patient is Alert, Appropriate and Oriented Extremity - ABD soft Sensation intact distally Intact pulses distally Dorsiflexion/Plantar flexion intact Incision: dressing C/D/I No cellulitis present Dressing/Incision - clean, dry, no drainage Motor Function - intact, moving foot and toes well on exam.  Mild tympany with percussion of the abdomen.  Past Medical History:  Diagnosis Date  . Asthma   . CHF (congestive heart failure) ([place])   . COPD (chronic obstructive pulmonary disease) ([place])   . Palpitations      Assessment/Plan: 1 Day Post-Op Procedure(s) (LRB): ARTHROPLASTY BIPOLAR HIP (HEMIARTHROPLASTY) (Right) Active Problems:   Hip fracture (HCC)  Estimated body mass index is 20.78 kg/m as calculated from the following:   Height as of this encounter: 5\' 9"  (1.753 m).   Weight as of this encounter: 63.8 kg (140 lb 11.2 oz). Advance diet Up with therapy D/C IV fluids when tolerating po intake.  Labs reviewed. BP 97/61, will plan on small bolus of IVF. Pt reports history of Ileus, continue to monitor. Begin working on having a BM.  Up with therapy today. Care management to assist with discharge planning.  DVT Prophylaxis - Lovenox, Foot Pumps and TED hose Weight-Bearing as tolerated to right leg  J. Cameron Proud, PA-C [name] Orthopaedic Surgery 02/12/2018, 7:49 AM

## 2018-02-12 NOTE — Progress Notes (Signed)
PT is recommending SNF. Clinical Education officer, museum (CSW) met with patient and presented bed offers. Patient chose [facility] and is okay with a semi-private room. CSW started [facility] authorization. [facility] admissions coordinator at [facility] is aware of above.   [employer], LCSW [phone]

## 2018-02-12 NOTE — Care Management Note (Signed)
Case Management Note  Patient Details  Name: ELYANAH FARINO MRN: [id number] Date of Birth: 04-16-1936  Subjective/Objective:                  Admitted to [facility] post Right hip repair, Lives alone. Neighbor is Dossie Arbour [phone]). Sees Dr. Lavera Guise as primary. Prescriptions are filled at [name] in [place]. No home Health. No skilled facility. No home oxygen or medical equipment. Followed by [facility]. Takes care of all basic and instrumental activities of daily living herself, drives. [place] prior to this admission. Fair appetite. Neighbor or son will transport  Action/Plan: Will continue to follow for discharge plans   Expected Discharge Date:                  Expected Discharge Plan:     In-House Referral:   yes  Discharge planning Services   yes  Post Acute Care Choice:    Choice offered to:     DME Arranged:    DME Agency:     HH Arranged:    [place] Agency:     Status of Service:     If discussed at [employer], dates discussed:    Additional Comments:  Shelbie Ammons, RN MSN CCM Care Management [phone] 02/12/2018, 10:11 AM

## 2018-02-13 DIAGNOSIS — M546 Pain in thoracic spine: Secondary | ICD-10-CM | POA: Diagnosis not present

## 2018-02-13 DIAGNOSIS — F339 Major depressive disorder, recurrent, unspecified: Secondary | ICD-10-CM | POA: Diagnosis not present

## 2018-02-13 DIAGNOSIS — S72011D Unspecified intracapsular fracture of right femur, subsequent encounter for closed fracture with routine healing: Secondary | ICD-10-CM | POA: Diagnosis not present

## 2018-02-13 DIAGNOSIS — M810 Age-related osteoporosis without current pathological fracture: Secondary | ICD-10-CM | POA: Diagnosis not present

## 2018-02-13 DIAGNOSIS — M25559 Pain in unspecified hip: Secondary | ICD-10-CM | POA: Diagnosis not present

## 2018-02-13 DIAGNOSIS — R262 Difficulty in walking, not elsewhere classified: Secondary | ICD-10-CM | POA: Diagnosis not present

## 2018-02-13 DIAGNOSIS — Z9981 Dependence on supplemental oxygen: Secondary | ICD-10-CM | POA: Diagnosis not present

## 2018-02-13 DIAGNOSIS — Z7951 Long term (current) use of inhaled steroids: Secondary | ICD-10-CM | POA: Diagnosis not present

## 2018-02-13 DIAGNOSIS — J449 Chronic obstructive pulmonary disease, unspecified: Secondary | ICD-10-CM | POA: Diagnosis not present

## 2018-02-13 DIAGNOSIS — Z7401 Bed confinement status: Secondary | ICD-10-CM | POA: Diagnosis not present

## 2018-02-13 DIAGNOSIS — Z96641 Presence of right artificial hip joint: Secondary | ICD-10-CM | POA: Diagnosis not present

## 2018-02-13 DIAGNOSIS — Z9181 History of falling: Secondary | ICD-10-CM | POA: Diagnosis not present

## 2018-02-13 DIAGNOSIS — S72001S Fracture of unspecified part of neck of right femur, sequela: Secondary | ICD-10-CM | POA: Diagnosis not present

## 2018-02-13 DIAGNOSIS — I509 Heart failure, unspecified: Secondary | ICD-10-CM | POA: Diagnosis not present

## 2018-02-13 DIAGNOSIS — K219 Gastro-esophageal reflux disease without esophagitis: Secondary | ICD-10-CM | POA: Diagnosis not present

## 2018-02-13 DIAGNOSIS — E785 Hyperlipidemia, unspecified: Secondary | ICD-10-CM | POA: Diagnosis not present

## 2018-02-13 DIAGNOSIS — M8000XA Age-related osteoporosis with current pathological fracture, unspecified site, initial encounter for fracture: Secondary | ICD-10-CM | POA: Diagnosis not present

## 2018-02-13 DIAGNOSIS — H04123 Dry eye syndrome of bilateral lacrimal glands: Secondary | ICD-10-CM | POA: Diagnosis not present

## 2018-02-13 DIAGNOSIS — J441 Chronic obstructive pulmonary disease with (acute) exacerbation: Secondary | ICD-10-CM | POA: Diagnosis not present

## 2018-02-13 DIAGNOSIS — D649 Anemia, unspecified: Secondary | ICD-10-CM | POA: Diagnosis not present

## 2018-02-13 DIAGNOSIS — G8929 Other chronic pain: Secondary | ICD-10-CM | POA: Diagnosis not present

## 2018-02-13 DIAGNOSIS — K210 Gastro-esophageal reflux disease with esophagitis: Secondary | ICD-10-CM | POA: Diagnosis not present

## 2018-02-13 DIAGNOSIS — R0609 Other forms of dyspnea: Secondary | ICD-10-CM | POA: Diagnosis not present

## 2018-02-13 DIAGNOSIS — M6281 Muscle weakness (generalized): Secondary | ICD-10-CM | POA: Diagnosis not present

## 2018-02-13 DIAGNOSIS — K5909 Other constipation: Secondary | ICD-10-CM | POA: Diagnosis not present

## 2018-02-13 DIAGNOSIS — J45901 Unspecified asthma with (acute) exacerbation: Secondary | ICD-10-CM | POA: Diagnosis not present

## 2018-02-13 DIAGNOSIS — J309 Allergic rhinitis, unspecified: Secondary | ICD-10-CM | POA: Diagnosis not present

## 2018-02-13 DIAGNOSIS — S72001A Fracture of unspecified part of neck of right femur, initial encounter for closed fracture: Secondary | ICD-10-CM | POA: Diagnosis not present

## 2018-02-13 DIAGNOSIS — J45909 Unspecified asthma, uncomplicated: Secondary | ICD-10-CM | POA: Diagnosis not present

## 2018-02-13 DIAGNOSIS — I110 Hypertensive heart disease with heart failure: Secondary | ICD-10-CM | POA: Diagnosis not present

## 2018-02-13 LAB — BASIC METABOLIC PANEL
ANION GAP: 8 (ref 5–15)
BUN: 16 mg/dL (ref 8–23)
CALCIUM: 8.2 mg/dL — AB (ref 8.9–10.3)
CO2: 26 mmol/L (ref 22–32)
Chloride: 105 mmol/L (ref 98–111)
Creatinine, Ser: 0.93 mg/dL (ref 0.44–1.00)
GFR calc Af Amer: >60 mL/min (ref >60)
GFR, EST NON AFRICAN AMERICAN: 57 mL/min — AB (ref >60)
Glucose, Bld: 115 mg/dL — ABNORMAL HIGH (ref 70–99)
POTASSIUM: 3.7 mmol/L (ref 3.5–5.1)
Sodium: 139 mmol/L (ref 135–145)

## 2018-02-13 MED ORDER — BISACODYL 10 MG RE SUPP
10.0000 mg | Freq: Once | RECTAL | Status: AC
  Administered 2018-02-13: 10 mg via RECTAL
  Filled 2018-02-13: qty 1

## 2018-02-13 MED ORDER — DIAZEPAM 2 MG PO TABS: 2.0000 mg | ORAL_TABLET | Freq: Two times a day (BID) | ORAL | 0 refills | Status: DC | PRN

## 2018-02-13 MED ORDER — DOCUSATE SODIUM 100 MG PO CAPS: 100.0000 mg | ORAL_CAPSULE | Freq: Two times a day (BID) | ORAL | 0 refills | Status: DC

## 2018-02-13 MED ORDER — ONDANSETRON HCL 4 MG PO TABS: 4.0000 mg | ORAL_TABLET | Freq: Four times a day (QID) | ORAL | 0 refills | Status: DC | PRN

## 2018-02-13 MED ORDER — MAGNESIUM HYDROXIDE 400 MG/5ML PO SUSP: 30.0000 mL | Freq: Every day | ORAL | 0 refills | Status: DC | PRN

## 2018-02-13 MED ORDER — GUAIFENESIN-DM 100-10 MG/5ML PO SYRP: 5.0000 mL | ORAL_SOLUTION | Freq: Four times a day (QID) | ORAL | 0 refills | Status: DC | PRN

## 2018-02-13 MED ORDER — ENOXAPARIN SODIUM 30 MG/0.3ML ~~LOC~~ SOLN: 1.0000 mg/kg | Freq: Two times a day (BID) | SUBCUTANEOUS | 0 refills | Status: DC

## 2018-02-13 MED ORDER — ACETAMINOPHEN 325 MG PO TABS: 325.0000 mg | ORAL_TABLET | Freq: Four times a day (QID) | ORAL | Status: DC | PRN

## 2018-02-13 MED ORDER — OXYCODONE HCL 5 MG PO TABS: 5.0000 mg | ORAL_TABLET | ORAL | 0 refills | Status: DC | PRN

## 2018-02-13 NOTE — Progress Notes (Signed)
   Subjective: 2 Days Post-Op Procedure(s) (LRB): ARTHROPLASTY BIPOLAR HIP (HEMIARTHROPLASTY) (Right) Patient reports pain as mild.   Patient is well, and has had no acute complaints or problems Did fair with physical therapy yesterday.  Was able to ambulate 15 feet Plan is to go Rehab after hospital stay. no nausea and no vomiting Patient denies any chest pains or shortness of breath. Slept well last evening Objective: Vital signs in last 24 hours: Temp:  [98.7 F (37.1 C)] 98.7 F (37.1 C) (07/26 2341) Pulse Rate:  [98] 98 (07/26 2341) BP: (98-109)/(50-59) 98/50 (07/26 2341) SpO2:  [92 %-95 %] 92 % (07/26 2341) Weight:  [64 kg (141 lb 1.5 oz)] 64 kg (141 lb 1.5 oz) (07/27 0451) well approximated incision Heels are non tender and elevated off the bed using rolled towels Intake/Output from previous day: 07/26 0701 - 07/27 0700 In: 2238 [P.O.:1440; I.V.:548; IV Piggyback:250] Out: 600 [Urine:600] Intake/Output this shift: No intake/output data recorded.  Recent Labs    02/11/18 0503 02/12/18 0348  HGB 12.8 12.2   Recent Labs    02/11/18 0503 02/12/18 0348  WBC 10.6 10.9  RBC 3.77* 3.55*  HCT 36.9 34.5*  PLT 234 207   Recent Labs    02/12/18 0348 02/13/18 0433  NA 140 139  K 4.0 3.7  CL 106 105  CO2 29 26  BUN 11 16  CREATININE 0.58 0.93  GLUCOSE 131* 115*  CALCIUM 7.9* 8.2*   Recent Labs    02/11/18 0503  INR 1.01    EXAM General - Patient is Alert, Appropriate and Oriented Extremity - Neurologically intact Neurovascular intact Sensation intact distally Intact pulses distally Dorsiflexion/Plantar flexion intact No cellulitis present Compartment soft Dressing - dressing C/D/I Motor Function - intact, moving foot and toes well on exam.    Past Medical History:  Diagnosis Date  . Asthma   . CHF (congestive heart failure) ([place])   . COPD (chronic obstructive pulmonary disease) ([place])   . Palpitations     Assessment/Plan: 2 Days Post-Op  Procedure(s) (LRB): ARTHROPLASTY BIPOLAR HIP (HEMIARTHROPLASTY) (Right) Active Problems:   Hip fracture (HCC)  Estimated body mass index is 20.84 kg/m as calculated from the following:   Height as of this encounter: '5\' 9"'$  (1.753 m).   Weight as of this encounter: 64 kg (141 lb 1.5 oz). Up with therapy Discharge to SNF when medically cleared  Labs: Met be within normal limits except for glucose which is 115.  Hemoglobin 12.2 DVT Prophylaxis - Lovenox, Foot Pumps and TED hose Weight-Bearing as tolerated to right leg Patient will need to continuing with Lovenox 30 mg twice daily for 2 weeks once she leaves the hospital Patient will need to follow-up in [facility] in 2 weeks for staple removal Patient needs a bowel movement Change dressing as needed Elevate heels off bed  Jon R. [place] 02/13/2018, 7:25 AM

## 2018-02-13 NOTE — Progress Notes (Signed)
Physical Therapy Treatment Patient Details Name: Teresa Chambers MRN: [id number] DOB: April 19, 1936 Today's Date: 02/13/2018    History of Present Illness Pt is a 82 y.o. female presenting to hospital 02/11/18 s/p fall OOB.  Imaging showing subcapital fx of R femoral neck.  Pt s/p R hip unipolar hemiarthroplasty 02/11/18.  PMH includes COPD, asthma, CHF, hernia repair.    PT Comments    Pt agreeable to PT; reports 3/10 pain at most today. Pt progressing with all mobility demonstrating decreased assist to Min A and increased ambulation to 50 ft today. Mobility is slow and guarded throughout. Min cues for adherence to posterior hip precautions with mobility application, but pt is fully aware of precautions. Pt plans to discharge to skilled nursing facility today to continue rehab efforts.    Follow Up Recommendations  SNF     Equipment Recommendations  Rolling walker with 5" wheels    Recommendations for Other Services       Precautions / Restrictions Precautions Precautions: Fall;Posterior Hip Restrictions Weight Bearing Restrictions: Yes RLE Weight Bearing: Weight bearing as tolerated    Mobility  Bed Mobility Overal bed mobility: Needs Assistance Bed Mobility: Supine to Sit;Sit to Supine     Supine to sit: Min assist Sit to supine: Min assist   General bed mobility comments: Assit RLE and trunk; cues for posterior hip precautions  Transfers Overall transfer level: Needs assistance Equipment used: Rolling walker (2 wheeled) Transfers: Sit to/from Stand Sit to Stand: Min assist         General transfer comment: cues for R foot placement for hip precautions. Slow. Cues for hand placemnet with sit to adhere to precautions as well. Performs from bed, recliner and commode  Ambulation/Gait Ambulation/Gait assistance: Min guard Gait Distance (Feet): 50 Feet(second walk 25 ft) Assistive device: Rolling walker (2 wheeled) Gait Pattern/deviations: Step-to pattern   Gait  velocity interpretation: <1.31 ft/sec, indicative of household ambulator General Gait Details: slow, cues for improved body position to rw post 3 point step sequence to avoid posterior LOB. Adheres well to hip precautions regarding toe in during ambulation/turns   Stairs             Wheelchair Mobility    Modified Rankin (Stroke Patients Only)       Balance Overall balance assessment: Needs assistance Sitting-balance support: Feet supported;Bilateral upper extremity supported Sitting balance-Leahy Scale: Fair     Standing balance support: Bilateral upper extremity supported Standing balance-Leahy Scale: Fair                              Cognition Arousal/Alertness: Awake/alert Behavior During Therapy: WFL for tasks assessed/performed Overall Cognitive Status: Within Functional Limits for tasks assessed                                        Exercises Total Joint Exercises Ankle Circles/Pumps: AROM;Both;Seated;15 reps Long Arc Quad: AROM;Right;15 reps;Seated    General Comments        Pertinent Vitals/Pain Pain Assessment: 0-10 Pain Score: 3 (1 at rest) Pain Location: R hip Pain Intervention(s): Monitored during session;Premedicated before session    Home Living                      Prior Function            PT Goals (current goals  can now be found in the care plan section) Progress towards PT goals: Progressing toward goals    Frequency    BID      PT Plan Current plan remains appropriate    Co-evaluation              AM-PAC PT "6 Clicks" Daily Activity  Outcome Measure  Difficulty turning over in bed (including adjusting bedclothes, sheets and blankets)?: A Little Difficulty moving from lying on back to sitting on the side of the bed? : Unable Difficulty sitting down on and standing up from a chair with arms (e.g., wheelchair, bedside commode, etc,.)?: Unable Help needed moving to and from a bed  to chair (including a wheelchair)?: A Little Help needed walking in hospital room?: A Little Help needed climbing 3-5 steps with a railing? : A Lot 6 Click Score: 13    End of Session Equipment Utilized During Treatment: Gait belt Activity Tolerance: Patient tolerated treatment well Patient left: in bed;with call bell/phone within reach;with nursing/sitter in room   PT Visit Diagnosis: Other abnormalities of gait and mobility (R26.89);Muscle weakness (generalized) (M62.81);History of falling (Z91.81);Difficulty in walking, not elsewhere classified (R26.2);Pain Pain - Right/Left: Right Pain - part of body: Hip     Time: 1020-1051 PT Time Calculation (min) (ACUTE ONLY): 31 min  Charges:  $Gait Training: 8-22 mins $Therapeutic Activity: 8-22 mins                      Larae Grooms, PTA 02/13/2018, 11:42 AM

## 2018-02-13 NOTE — Progress Notes (Signed)
SATURATION QUALIFICATIONS: (This note is used to comply with regulatory documentation for home oxygen)  Patient Saturations on Room Air at Rest = 85 %   Patient Saturations on 2L oxygen at Rest = 95 %

## 2018-02-13 NOTE — Progress Notes (Signed)
Occupational Therapy Treatment Patient Details Name: KRISTEN FROMM MRN: [id number] DOB: Aug 17, 1935 Today's Date: 02/13/2018    History of present illness Pt is a 82 y.o. female presenting to hospital 02/11/18 s/p fall OOB.  Imaging showing subcapital fx of R femoral neck.  Pt s/p R hip unipolar hemiarthroplasty 02/11/18.  PMH includes COPD, asthma, CHF, hernia repair.   OT comments  Pt seen for OT tx this date. Pt up in recliner, agreeable to therapy. Pt able to recall 2/3 requiring a verbal cue to remember the 3rd. Additional instruction provided to support recall and carryover. At end of session, recalled 2/3 with cue for 3rd again. Pt instructed in AE for LB ADL tasks and modified techniques to perform ADL and mobility while maintaining precautions. Pt will continue to benefit from additional training/education to support recall and carryover. STR remains appropriate.     Follow Up Recommendations  SNF    Equipment Recommendations       Recommendations for Other Services      Precautions / Restrictions Precautions Precautions: Fall;Posterior Hip Precaution Comments: Pt able to recall 2/3 requiring a verbal cue to remember the 3rd. Additional instruction provided to support recall and carryover. At end of session, recalled 2/3 with cue for 3rd again. Restrictions Weight Bearing Restrictions: Yes RLE Weight Bearing: Weight bearing as tolerated       Mobility     Balance                             ADL either performed or assessed with clinical judgement   ADL Overall ADL's : Needs assistance/impaired               Lower Body Bathing Details (indicate cue type and reason): pt instructed in modified techniques and AE to perform in order to maintain precautions       Lower Body Dressing Details (indicate cue type and reason): pt instructed in modified techniques and AE to perform in order to maintain precautions                     Vision Patient  Visual Report: No change from baseline     Perception     Praxis      Cognition Arousal/Alertness: Awake/alert Behavior During Therapy: WFL for tasks assessed/performed Overall Cognitive Status: Within Functional Limits for tasks assessed                                          Exercises Other Exercises Other Exercises: pt/family instructed in AE for LB ADL to maintain precautions to support recall and carryover Other Exercises: pt/family instructed in posterior THPs to maximize recall and carryover into mobility and ADL tasks   Shoulder Instructions       General Comments      Pertinent Vitals/ Pain       Pain Assessment: 0-10 Pain Score: 2  Pain Location: 2/10 headache, 1/10 R hip Pain Descriptors / Indicators: Aching;Headache Pain Intervention(s): Limited activity within patient's tolerance;Monitored during session  Home Living                                          Prior Functioning/Environment  Frequency  Min 1X/week        Progress Toward Goals  OT Goals(current goals can now be found in the care plan section)  Progress towards OT goals: Progressing toward goals  Acute Rehab OT Goals Patient Stated Goal: to be able to walk again OT Goal Formulation: With patient/family Time For Goal Achievement: 02/26/18 Potential to Achieve Goals: Good  Plan Discharge plan remains appropriate;Frequency remains appropriate    Co-evaluation                 AM-PAC PT "6 Clicks" Daily Activity     Outcome Measure   Help from another person eating meals?: None Help from another person taking care of personal grooming?: None Help from another person toileting, which includes using toliet, bedpan, or urinal?: A Lot Help from another person bathing (including washing, rinsing, drying)?: A Lot Help from another person to put on and taking off regular upper body clothing?: A Little Help from another person to  put on and taking off regular lower body clothing?: A Lot 6 Click Score: 17    End of Session    OT Visit Diagnosis: Other abnormalities of gait and mobility (R26.89);Pain Pain - Right/Left: Right Pain - part of body: Hip   Activity Tolerance Patient tolerated treatment well   Patient Left in chair;with call bell/phone within reach;with chair alarm set   Nurse Communication          Time: [phone] OT Time Calculation (min): 14 min  Charges: OT General Charges $OT Visit: 1 Visit OT Treatments $Self Care/Home Management : 8-22 mins  Jeni Salles, MPH, MS, OTR/L ascom [phone] 02/13/18, 12:07 PM

## 2018-02-13 NOTE — Discharge Summary (Signed)
[place] [facility] [place] NAME: Teresa Chambers    MR#:  [id number]  DATE OF BIRTH:  22-Mar-1936  DATE OF ADMISSION:  02/11/2018 ADMITTING PHYSICIAN: Harrie Foreman, MD  DATE OF DISCHARGE:  02/13/18  PRIMARY CARE PHYSICIAN: Cletis Athens, MD    ADMISSION DIAGNOSIS:  fall  DISCHARGE DIAGNOSIS:  Active Problems:   Hip fracture ([place])   SECONDARY DIAGNOSIS:   Past Medical History:  Diagnosis Date  . Asthma   . CHF (congestive heart failure) ([place])   . COPD (chronic obstructive pulmonary disease) ([place])   . Palpitations     HOSPITAL COURSE:   HPI: The patient with past medical history of COPD, asthma and CHF presents the emergency department after suffering a fall.  The patient states that she was dreaming and rolled out of bed onto her hip.  She was unable to stand due to pain and had to crawl under her bed (which is very high) to reach the phone to call EMS.  X-ray of her painful hip in the emergency department revealed fracture of the femoral neck which prompted the emergency department staff to call the hospitalist service for admission  1. Hip fracture: Right subcapital fracture of femoral neck.   Postop day #2  Postop care and pain management by Ortho Foley catheter discontinued Follow-up with [facility] in 2 weeks DVT prophylaxis with Lovenox 30 mg subcu twice daily for 2 weeks  2.  Acute hypoxia with history ofCOPD: Controlled; continue inhaled corticosteroid with long-acting bronchodilator. Albuterol as needed. Patient is breathing very shallow, requiring 2 L of oxygen.  We will discharge her with 2 L of oxygen for now which can be weaned off at the facility  3. CHF: The patient carries this diagnosis but she does not have cardiology notes for review nor echocardiogram results available at this time. She is not on any diuretic therapy and has no evidence of decompensation.    Patient needs an outpatient echocardiogram  4.   Hypertension blood pressure is soft holding Vasotec at this time  4. DVT prophylaxis:  Lovenox 30 mg subcutaneously twice a day for 2 weeks  5. GI prophylaxis: None     DISCHARGE CONDITIONS:   stable  CONSULTS OBTAINED:  Treatment Team:  Hessie Knows, MD Poggi, Marshall Cork, MD   PROCEDURES  ARTHROPLASTY BIPOLAR HIP (HEMIARTHROPLASTY) (Right)    DRUG ALLERGIES:   Allergies  Allergen Reactions  . Codeine Nausea And Vomiting  . Lidocaine Swelling    DISCHARGE MEDICATIONS:   Allergies as of 02/13/2018      Reactions   Codeine Nausea And Vomiting   Lidocaine Swelling      Medication List    STOP taking these medications   enalapril 5 MG tablet Commonly known as:  VASOTEC   PARoxetine 10 MG tablet Commonly known as:  PAXIL     TAKE these medications   acetaminophen 325 MG tablet Commonly known as:  TYLENOL Take 1-2 tablets (325-650 mg total) by mouth every 6 (six) hours as needed for mild pain (pain score 1-3 or temp > 100.5).   albuterol (2.5 MG/3ML) 0.083% nebulizer solution Commonly known as:  PROVENTIL Inhale 1 ampule into the lungs 4 (four) times daily.   albuterol 108 (90 Base) MCG/ACT inhaler Commonly known as:  PROVENTIL HFA;VENTOLIN HFA Inhale 2 puffs into the lungs every 6 (six) hours as needed for wheezing or shortness of breath.   diazepam 2 MG tablet Commonly known  as:  VALIUM Take 1 tablet (2 mg total) by mouth 2 (two) times daily as needed for muscle spasms. What changed:  reasons to take this   docusate sodium 100 MG capsule Commonly known as:  COLACE Take 1 capsule (100 mg total) by mouth 2 (two) times daily.   DULoxetine 60 MG capsule Commonly known as:  CYMBALTA Take 1 capsule by mouth 2 (two) times daily.   enoxaparin 30 MG/0.3ML injection Commonly known as:  LOVENOX Inject 0.64 mLs (64 mg total) into the skin every 12 (twelve) hours.   fluticasone 50 MCG/ACT nasal spray Commonly known as:  FLONASE Place 2 sprays into the  nose daily.   Fluticasone-Salmeterol 250-50 MCG/DOSE Aepb Commonly known as:  ADVAIR Inhale 1 puff into the lungs 2 (two) times daily.   FUSION 65-65-25-30 MG Caps Take 1 capsule by mouth daily.   guaiFENesin-dextromethorphan 100-10 MG/5ML syrup Commonly known as:  ROBITUSSIN DM Take 5 mLs by mouth every 6 (six) hours as needed for cough.   magnesium hydroxide 400 MG/5ML suspension Commonly known as:  MILK OF MAGNESIA Take 30 mLs by mouth daily as needed for mild constipation.   montelukast 10 MG tablet Commonly known as:  SINGULAIR Take 1 tablet by mouth daily.   omeprazole 40 MG capsule Commonly known as:  PRILOSEC Take 1 capsule by mouth daily.   ondansetron 4 MG tablet Commonly known as:  ZOFRAN Take 1 tablet (4 mg total) by mouth every 6 (six) hours as needed for nausea.   oxyCODONE 5 MG immediate release tablet Commonly known as:  Oxy IR/ROXICODONE Take 1-2 tablets (5-10 mg total) by mouth every 4 (four) hours as needed for moderate pain, severe pain or breakthrough pain.   simvastatin 40 MG tablet Commonly known as:  ZOCOR Take 1 tablet by mouth daily.   XIIDRA 5 % Soln Generic drug:  Lifitegrast Apply to eye.        DISCHARGE INSTRUCTIONS:  Follow-up with primary care physician at the facility in 3 to 4 days Follow-up with orthopedics at [name] clinic in 2 weeks for staple removal   DIET:  Cardiac diet  DISCHARGE CONDITION:  Fair  ACTIVITY:  Activity as tolerated per Ortho  OXYGEN:  Home Oxygen: 2 lit    Oxygen Delivery: 2 lit   DISCHARGE LOCATION:  nursing home   If you experience worsening of your admission symptoms, develop shortness of breath, life threatening emergency, suicidal or homicidal thoughts you must seek medical attention immediately by calling 911 or calling your MD immediately  if symptoms less severe.  You Must read complete instructions/literature along with all the possible adverse reactions/side effects for all the  Medicines you take and that have been prescribed to you. Take any new Medicines after you have completely understood and accpet all the possible adverse reactions/side effects.   Please note  You were cared for by a hospitalist during your hospital stay. If you have any questions about your discharge medications or the care you received while you were in the hospital after you are discharged, you can call the unit and asked to speak with the hospitalist on call if the hospitalist that took care of you is not available. Once you are discharged, your primary care physician will handle any further medical issues. Please note that NO REFILLS for any discharge medications will be authorized once you are discharged, as it is imperative that you return to your primary care physician (or establish a relationship with a primary care  physician if you do not have one) for your aftercare needs so that they can reassess your need for medications and monitor your lab values.     Today  Chief Complaint  Patient presents with  . Fall  . Hip Pain   Pt is doing fine.   ROS:  CONSTITUTIONAL: Denies fevers, chills. Denies any fatigue, weakness.  EYES: Denies blurry vision, double vision, eye pain. EARS, NOSE, THROAT: Denies tinnitus, ear pain, hearing loss. RESPIRATORY: Denies cough, wheeze, shortness of breath.  CARDIOVASCULAR: Denies chest pain, palpitations, edema.  GASTROINTESTINAL: Denies nausea, vomiting, diarrhea, abdominal pain. Denies bright red blood per rectum. GENITOURINARY: Denies dysuria, hematuria. ENDOCRINE: Denies nocturia or thyroid problems. HEMATOLOGIC AND LYMPHATIC: Denies easy bruising or bleeding. SKIN: Denies rash or lesion. MUSCULOSKELETAL: Right hip pain  NEUROLOGIC: Denies paralysis, paresthesias.  PSYCHIATRIC: Denies anxiety or depressive symptoms.   VITAL SIGNS:  Blood pressure (!) 113/57, pulse 71, temperature 98.6 F (37 C), temperature source Oral, resp. rate 18, height  5\' 9"  (1.753 m), weight 64 kg (141 lb 1.5 oz), SpO2 95 %.  I/O:    Intake/Output Summary (Last 24 hours) at 02/13/2018 1026 Last data filed at 02/13/2018 0400 Gross per 24 hour  Intake 1998 ml  Output 600 ml  Net 1398 ml    PHYSICAL EXAMINATION:  GENERAL:  82 y.o.-year-old patient lying in the bed with no acute distress.  EYES: Pupils equal, round, reactive to light and accommodation. No scleral icterus. Extraocular muscles intact.  HEENT: Head atraumatic, normocephalic. Oropharynx and nasopharynx clear.  NECK:  Supple, no jugular venous distention. No thyroid enlargement, no tenderness.  LUNGS: Normal breath sounds bilaterally, no wheezing, rales,rhonchi or crepitation. No use of accessory muscles of respiration.  CARDIOVASCULAR: S1, S2 normal. No murmurs, rubs, or gallops.  ABDOMEN: Soft, non-tender, non-distended. Bowel sounds present. No organomegaly or mass.  EXTREMITIES: Right hip with clean honeycomb dressing  no pedal edema, cyanosis, or clubbing.  NEUROLOGIC: Cranial nerves II through XII are intact.  Sensation intact. Gait not checked.  PSYCHIATRIC: The patient is alert and oriented x 3.  SKIN: No obvious rash, lesion, or ulcer.   DATA REVIEW:   CBC Recent Labs  Lab 02/12/18 0348  WBC 10.9  HGB 12.2  HCT 34.5*  PLT 207    Chemistries  Recent Labs  Lab 02/11/18 0503  02/13/18 0433  NA 145   < > 139  K 3.7   < > 3.7  CL 109   < > 105  CO2 30   < > 26  GLUCOSE 112*   < > 115*  BUN 11   < > 16  CREATININE 0.69   < > 0.93  CALCIUM 8.5*   < > 8.2*  AST 24  --   --   ALT 20  --   --   ALKPHOS 40  --   --   BILITOT 0.6  --   --    < > = values in this interval not displayed.    Cardiac Enzymes No results for input(s): TROPONINI in the last 168 hours.  Microbiology Results  Results for orders placed or performed during the hospital encounter of 02/11/18  Surgical pcr screen     Status: None   Collection Time: 02/11/18 11:10 AM  Result Value Ref Range  Status   MRSA, PCR NEGATIVE NEGATIVE Final   Staphylococcus aureus NEGATIVE NEGATIVE Final    Comment: (NOTE) The Xpert SA Assay (FDA approved for NASAL specimens in patients  30 years of age and older), is one component of a comprehensive surveillance program. It is not intended to diagnose infection nor to guide or monitor treatment. Performed at [facility], [address] [place], [place] [address]     RADIOLOGY:  Dg Chest 1 View  Result Date: 02/11/2018 CLINICAL DATA:  Patient rule out of bed landing on the right hip. Right hip fracture. EXAM: CHEST  1 VIEW COMPARISON:  01/20/2008 FINDINGS: Normal heart size and pulmonary vascularity. Large calcified granuloma in the left lung base. No airspace disease or consolidation. No blunting of costophrenic angles. No pneumothorax. Calcification of the aorta. IMPRESSION: No evidence of active pulmonary disease. Large calcified granuloma in the left lung base. Aortic atherosclerosis. Electronically Signed   By: Lucienne Capers M.D.   On: 02/11/2018 04:32   Dg Chest 2 View  Result Date: 02/12/2018 CLINICAL DATA:  Hip surgery cough EXAM: CHEST - 2 VIEW COMPARISON:  02/11/2018, 01/19/2018 FINDINGS: Hyperinflation. No acute consolidation or effusion. Stable cardiomediastinal silhouette with aortic atherosclerosis. No pneumothorax. Stable calcified lung nodule at the left base. IMPRESSION: No active cardiopulmonary disease. Electronically Signed   By: Donavan Foil M.D.   On: 02/12/2018 20:13   Dg Hip Unilat W Or W/o Pelvis 2-3 Views Right  Result Date: 02/11/2018 CLINICAL DATA:  Initial evaluation status post right hip replacement. EXAM: DG HIP (WITH OR WITHOUT PELVIS) 2-3V RIGHT COMPARISON:  Prior radiograph from 02/11/2018 FINDINGS: Interval placement of a right total hip arthroplasty. Acetabular and femoral components appear well seated without adverse features. No periprosthetic fracture or other complication. Postoperative swelling and  emphysema overlies the right hip. Degenerative changes noted at the left hip. IMPRESSION: Postoperative changes from interval right total hip arthroplasty without complication. Electronically Signed   By: Jeannine Boga M.D.   On: 02/11/2018 16:59   Dg Hip Unilat  With Pelvis 2-3 Views Right  Result Date: 02/11/2018 CLINICAL DATA:  Patient fell out of bed, landing on the right hip. EXAM: DG HIP (WITH OR WITHOUT PELVIS) 2-3V RIGHT COMPARISON:  None. FINDINGS: There is a transverse subcapital fracture of the right femoral neck with varus angulation and impaction of the fracture fragments. The pelvis appears intact. SI joints and symphysis pubis are not displaced. Degenerative changes in the lower lumbar spine and in the hips. Surgical clips consistent with mesh abdominal wall hernia repair. IMPRESSION: Subcapital fracture of the right femoral neck with varus angulation and impaction. Electronically Signed   By: Lucienne Capers M.D.   On: 02/11/2018 04:31    EKG:   Orders placed or performed during the hospital encounter of 02/11/18  . EKG 12-Lead  . EKG 12-Lead      Management plans discussed with the patient, family and they are in agreement.  CODE STATUS:     Code Status Orders  (From admission, onward)        Start     Ordered   02/11/18 0533  Full code  Continuous     02/11/18 0532    Code Status History    This patient has a current code status but no historical code status.    Advance Directive Documentation     Most Recent Value  Type of Advance Directive  Healthcare Power of Attorney  Pre-existing out of facility DNR order (yellow form or pink MOST form)  -  "MOST" Form in Place?  -      TOTAL TIME TAKING CARE OF THIS PATIENT: 43  minutes.   Note:  This dictation was prepared with Dragon dictation along with smaller phrase technology. Any transcriptional errors that result from this process are unintentional.   @[facility] @  on 02/13/2018 at 10:26 AM  Between 7am  to 6pm - Pager - [phone]  After 6pm go to [email or web address] - password [email or web address] [facility]  Office  [phone]  CC: Primary care physician; Cletis Athens, MD

## 2018-02-13 NOTE — Discharge Instructions (Signed)
Follow-up with primary care physician at the facility in 3 to 4 days Follow-up with orthopedics at [facility] clinic in 2 weeks for staple removal

## 2018-02-13 NOTE — Progress Notes (Signed)
EMS called for transport. Patient had BM

## 2018-02-13 NOTE — Progress Notes (Signed)
Report called to [facility], Waiting on patient to have BM and then will call EMS for transport.

## 2018-02-13 NOTE — Clinical Social Work Note (Addendum)
Patient is medically ready for discharge today. CSW notified patient's daughter Nevin Bloodgood [phone] of discharge today to [facility]. CSW notified [facility] of discharge today. CSW faxed discharge summary to [facility] Patient will be transported by EMS. RN will call report and call for transport.   [place], Kirk

## 2018-02-15 ENCOUNTER — Other Ambulatory Visit: Payer: Self-pay

## 2018-02-15 DIAGNOSIS — R0609 Other forms of dyspnea: Secondary | ICD-10-CM | POA: Diagnosis not present

## 2018-02-15 DIAGNOSIS — J45909 Unspecified asthma, uncomplicated: Secondary | ICD-10-CM | POA: Diagnosis not present

## 2018-02-15 DIAGNOSIS — M8000XA Age-related osteoporosis with current pathological fracture, unspecified site, initial encounter for fracture: Secondary | ICD-10-CM | POA: Diagnosis not present

## 2018-02-15 LAB — SURGICAL PATHOLOGY

## 2018-02-15 MED ORDER — DIAZEPAM 2 MG PO TABS: 2.0000 mg | ORAL_TABLET | Freq: Two times a day (BID) | ORAL | 0 refills | Status: DC | PRN

## 2018-02-15 MED ORDER — OXYCODONE HCL 5 MG PO TABS: 5.0000 mg | ORAL_TABLET | ORAL | 0 refills | Status: DC | PRN

## 2018-02-15 NOTE — ED Provider Notes (Addendum)
[facility] Emergency Department Provider Note    First MD Initiated Contact with Patient 02/11/18 [phone]     (approximate)  I have reviewed the triage vital signs and the nursing notes.   HISTORY  Chief Complaint Fall and Hip Pain    HPI Teresa Chambers is a 82 y.o. female with below list of chronic medical conditions including COPD CHF and asthma presents to the emergency department after rolling out of bed onto her right hip with resultant 10 out of 10 right hip pain worse with any movement.  Patient denies any known head injury no dizziness no weakness or numbness.  Patient denies any visual changes.   Past Medical History:  Diagnosis Date  . Asthma   . CHF (congestive heart failure) ([place])   . COPD (chronic obstructive pulmonary disease) ([place])   . Palpitations     Patient Active Problem List   Diagnosis Date Noted  . Hip fracture ([place]) 02/11/2018  . Asthma 04/30/2017  . Chronic reflux esophagitis 04/30/2017  . Hyperlipemia 04/30/2017  . Osteoporosis 04/30/2017    Past Surgical History:  Procedure Laterality Date  . CHOLECYSTECTOMY    . COLON SURGERY    . HERNIA REPAIR    . HIP ARTHROPLASTY Right 02/11/2018   Procedure: ARTHROPLASTY BIPOLAR HIP (HEMIARTHROPLASTY);  Surgeon: Corky Mull, MD;  Location: [facility] ORS;  Service: Orthopedics;  Laterality: Right;    Prior to Admission medications   Medication Sig Start Date End Date Taking? Authorizing Provider  albuterol (PROVENTIL HFA;VENTOLIN HFA) 108 (90 Base) MCG/ACT inhaler Inhale 2 puffs into the lungs every 6 (six) hours as needed for wheezing or shortness of breath. 01/19/18  Yes Nena Polio, MD  albuterol (PROVENTIL) (2.5 MG/3ML) 0.083% nebulizer solution Inhale 1 ampule into the lungs 4 (four) times daily.    Yes [provider]  DULoxetine (CYMBALTA) 60 MG capsule Take 1 capsule by mouth 2 (two) times daily. 01/09/18  Yes [provider]  fluticasone (FLONASE) 50  MCG/ACT nasal spray Place 2 sprays into the nose daily. 04/20/17  Yes [provider]  Fluticasone-Salmeterol (ADVAIR) 250-50 MCG/DOSE AEPB Inhale 1 puff into the lungs 2 (two) times daily.   Yes [provider]  montelukast (SINGULAIR) 10 MG tablet Take 1 tablet by mouth daily. 11/19/17  Yes [provider]  omeprazole (PRILOSEC) 40 MG capsule Take 1 capsule by mouth daily. 01/09/18  Yes [provider]  simvastatin (ZOCOR) 40 MG tablet Take 1 tablet by mouth daily. 01/09/18  Yes [provider]  acetaminophen (TYLENOL) 325 MG tablet Take 1-2 tablets (325-650 mg total) by mouth every 6 (six) hours as needed for mild pain (pain score 1-3 or temp > 100.5). 02/13/18   Nicholes Mango, MD  diazepam (VALIUM) 2 MG tablet Take 1 tablet (2 mg total) by mouth 2 (two) times daily as needed for muscle spasms. 02/15/18   Gerlene Fee, NP  docusate sodium (COLACE) 100 MG capsule Take 1 capsule (100 mg total) by mouth 2 (two) times daily. 02/13/18   Gouru, Illene Silver, MD  enoxaparin (LOVENOX) 30 MG/0.3ML injection Inject 0.64 mLs (64 mg total) into the skin every 12 (twelve) hours. 02/13/18 02/13/19  Nicholes Mango, MD  Fe Fum-Fe Poly-Vit C-Lactobac (FUSION) 65-65-25-30 MG CAPS Take 1 capsule by mouth daily. 04/01/17   [provider]  guaiFENesin-dextromethorphan (ROBITUSSIN DM) 100-10 MG/5ML syrup Take 5 mLs by mouth every 6 (six) hours as needed for cough. 02/13/18   Gouru, Illene Silver,  MD  Lifitegrast Shirley Friar) 5 % SOLN Apply to eye.    [provider]  magnesium hydroxide (MILK OF MAGNESIA) 400 MG/5ML suspension Take 30 mLs by mouth daily as needed for mild constipation. 02/13/18   Gouru, Illene Silver, MD  ondansetron (ZOFRAN) 4 MG tablet Take 1 tablet (4 mg total) by mouth every 6 (six) hours as needed for nausea. 02/13/18   Gouru, Illene Silver, MD  oxyCODONE (OXY IR/ROXICODONE) 5 MG immediate release tablet Take 1-2 tablets (5-10 mg total) by mouth every 4 (four) hours as needed for  moderate pain, severe pain or breakthrough pain. 02/15/18   Gerlene Fee, NP    Allergies Codeine and Lidocaine  No family history on Chambers.  Social History Social History   Tobacco Use  . Smoking status: Never Smoker  . Smokeless tobacco: Never Used  Substance Use Topics  . Alcohol use: Never    Frequency: Never  . Drug use: Never    Review of Systems Constitutional: No fever/chills Eyes: No visual changes. ENT: No sore throat. Cardiovascular: Denies chest pain. Respiratory: Denies shortness of breath. Gastrointestinal: No abdominal pain.  No nausea, no vomiting.  No diarrhea.  No constipation. Genitourinary: Negative for dysuria. Musculoskeletal: Negative for neck pain.  Negative for back pain.  Positive for  right hip pain Integumentary: Negative for rash. Neurological: Negative for headaches, focal weakness or numbness.   ____________________________________________   PHYSICAL EXAM:  VITAL SIGNS: ED Triage Vitals  Enc Vitals Group     BP 02/11/18 0355 (!) 143/85     Pulse Rate 02/11/18 0355 79     Resp 02/11/18 0355 16     Temp 02/11/18 0355 98.5 F (36.9 C)     Temp Source 02/11/18 0355 Oral     SpO2 02/11/18 0355 96 %     Weight 02/11/18 0356 78 kg (172 lb)     Height 02/11/18 0356 1.753 m (5\' 9" )     Head Circumference --      Peak Flow --      Pain Score 02/11/18 0355 5     Pain Loc --      Pain Edu? --      Excl. in [place]? --     Constitutional: Alert and oriented.  Apparent discomfort  eyes: Conjunctivae are normal. PERRL. EOMI. Head: Atraumatic. Mouth/Throat: Mucous membranes are moist.  Oropharynx non-erythematous. Neck: No stridor.   Cardiovascular: Normal rate, regular rhythm. Good peripheral circulation. Grossly normal heart sounds. Respiratory: Normal respiratory effort.  No retractions. Lungs CTAB. Gastrointestinal: Soft and nontender. No distention.  Musculoskeletal: Pain to very gentle palpation of the right hip pain with  external/internal rotation of the right hip.  Apparently shortening with external rotation noted. Neurologic:  Normal speech and language. No gross focal neurologic deficits are appreciated.  Skin:  Skin is warm, dry and intact. No rash noted. Psychiatric: Mood and affect are normal. Speech and behavior are normal.  ____________________________________________   LABS (all labs ordered are listed, but only abnormal results are displayed)  Labs Reviewed  CBC - Abnormal; Notable for the following components:      Result Value   RBC 3.77 (*)    All other components within normal limits  COMPREHENSIVE METABOLIC PANEL - Abnormal; Notable for the following components:   Glucose, Bld 112 (*)    Calcium 8.5 (*)    All other components within normal limits  CBC WITH DIFFERENTIAL/PLATELET - Abnormal; Notable for the following components:   RBC 3.55 (*)  HCT 34.5 (*)    MCH 34.4 (*)    Neutro Abs 9.3 (*)    Lymphs Abs 0.9 (*)    All other components within normal limits  BASIC METABOLIC PANEL - Abnormal; Notable for the following components:   Glucose, Bld 131 (*)    Calcium 7.9 (*)    All other components within normal limits  BASIC METABOLIC PANEL - Abnormal; Notable for the following components:   Glucose, Bld 115 (*)    Calcium 8.2 (*)    GFR calc non Af Amer 57 (*)    All other components within normal limits  SURGICAL PCR SCREEN  PROTIME-INR  TSH  SURGICAL PATHOLOGY   ____________________________________________  EKG  ED ECG REPORT I, [place] N BROWN, the attending physician, personally viewed and interpreted this ECG.   Date: 02/11/2018  EKG Time: 4:01 AM  Rate: 77  Rhythm: Normal sinus rhythm  Axis: Normal  Intervals: Normal  ST&T Change: None  ____________________________________________  RADIOLOGY I, [place] N BROWN, personally viewed and evaluated these images (plain radiographs) as part of my medical decision making, as well as reviewing the written  report by the radiologist.  ED MD interpretation: Capital fracture of the right femoral neck with impaction  Official radiology report(s): CLINICAL DATA:  Patient fell out of bed, landing on the right hip.  EXAM: DG HIP (WITH OR WITHOUT PELVIS) 2-3V RIGHT  COMPARISON:  None.  FINDINGS: There is a transverse subcapital fracture of the right femoral neck with varus angulation and impaction of the fracture fragments. The pelvis appears intact. SI joints and symphysis pubis are not displaced. Degenerative changes in the lower lumbar spine and in the hips. Surgical clips consistent with mesh abdominal wall hernia repair.  IMPRESSION: Subcapital fracture of the right femoral neck with varus angulation and impaction.   Electronically Signed   By: Lucienne Capers M.D.   On: 02/11/2018 04:31  ____________________________________________  Procedures   ____________________________________________   INITIAL IMPRESSION / ASSESSMENT AND PLAN / ED COURSE  As part of my medical decision making, I reviewed the following data within the electronic MEDICAL RECORD NUMBER  82 year old female presented with above-stated history and physical exam secondary to accidental fall from bed with resultant right hip pain.  Concern for possible right hip fracture which was confirmed on x-ray.  Patient given IV morphine multiple doses while in the emergency department with improvement of pain.  Patient discussed with orthopedic surgeon on-call Dr. Roland Rack as well as Dr. Marcille Blanco hospitalist on-call who will admit the patient for further evaluation and definitive management for right hip fracture. ____________________________________________  FINAL CLINICAL IMPRESSION(S) / ED DIAGNOSES  Right hip femoral neck fracture  MEDICATIONS GIVEN DURING THIS VISIT:  Medications  ceFAZolin (ANCEF) 1-4 GM/50ML-% IVPB (  Not Given 02/11/18 1438)  ceFAZolin (ANCEF) powder 1 g ( Other MAR Unhold 02/11/18 1708)    morphine 2 MG/ML injection 2 mg (2 mg Intravenous Given 02/11/18 0502)  ondansetron (ZOFRAN) injection 4 mg (4 mg Intravenous Given 02/11/18 0502)  ceFAZolin (ANCEF) IVPB 2g/100 mL premix (0 g Intravenous Stopped 02/12/18 1025)  acetaminophen (TYLENOL) tablet 1,000 mg (1,000 mg Oral Given 02/12/18 1541)  bisacodyl (DULCOLAX) suppository 10 mg (10 mg Rectal Given 02/13/18 1054)     ED Discharge Orders        Ordered    acetaminophen (TYLENOL) 325 MG tablet  Every 6 hours PRN     02/13/18 1024    oxyCODONE (OXY IR/ROXICODONE) 5 MG immediate release tablet  Every 4 hours PRN,   Status:  Discontinued     02/13/18 1024    docusate sodium (COLACE) 100 MG capsule  2 times daily     02/13/18 1024    magnesium hydroxide (MILK OF MAGNESIA) 400 MG/5ML suspension  Daily PRN     02/13/18 1024    ondansetron (ZOFRAN) 4 MG tablet  Every 6 hours PRN     02/13/18 1024    guaiFENesin-dextromethorphan (ROBITUSSIN DM) 100-10 MG/5ML syrup  Every 6 hours PRN     02/13/18 1024    diazepam (VALIUM) 2 MG tablet  2 times daily PRN,   Status:  Discontinued     02/13/18 1024    enoxaparin (LOVENOX) 30 MG/0.3ML injection  Every 12 hours     02/13/18 1024       Note:  This document was prepared using Dragon voice recognition software and may include unintentional dictation errors.    Gregor Hams, MD 02/15/18 2229    Gregor Hams, MD 02/15/18 2229    Gregor Hams, MD 02/19/18 2244

## 2018-02-15 NOTE — Telephone Encounter (Signed)
Rx sent to [facility] phone : [phone] , fax : [phone]  

## 2018-02-17 ENCOUNTER — Other Ambulatory Visit: Payer: Self-pay | Admitting: Pharmacist

## 2018-02-17 NOTE — Patient Outreach (Signed)
[facility]) Care Management  02/17/2018  TORII ROYSE Sep 30, 1935 [id number]  Care Coordination call with Johny Shock, [place].   Patient recently hospitalized at [facility] 02/11/18 to 02/13/2018 for a broken hip following a fall out of bed and hip surgery. Patient was discharged to [facility] for rehabilitation.   The pharmacy team is working on medication assistance for Advair and Proventil for this patient. Currently, we need patient's portions of MAP applications to be returned to [place] via mail. Per protocol, I will close the pharmacy episode and remove myself and Etter Sjogren, [facility] CPhT from the care team. However, if patient mailed her portions of the MAP applications back prior to her admission, we will continue applications on her behalf to minimize delay upon discharge from the facility.   I will place orders for social work to follow up with patient for [facility] and will gladly reopen pharmacy episode for medication review and medication assistance upon her discharge home. Johny Shock, [facility] RN health coach, is aware of my episode closure.   [name], PharmD Clinical Pharmacist, [facility] [phone]

## 2018-02-18 ENCOUNTER — Encounter
Admission: RE | Admit: 2018-02-18 | Discharge: 2018-02-18 | Disposition: A | Discharge status: Home / Self Care | Payer: PPO | Source: Ambulatory Visit | Attending: Internal Medicine | Admitting: Internal Medicine

## 2018-02-19 ENCOUNTER — Other Ambulatory Visit: Payer: Self-pay | Admitting: *Deleted

## 2018-02-19 NOTE — Patient Outreach (Signed)
[facility]) Care Management  02/19/2018  Teresa Chambers 07-19-1936 [id number]  Received Telephone call from [name] [facility]. Made aware of patient status. Late entry case closure for RN Health Coach closure. Patient admitted to hospital and transferred to SNF Patient to be followed by SW for [place] Management [phone] .

## 2018-02-22 ENCOUNTER — Non-Acute Institutional Stay (SKILLED_NURSING_FACILITY): Payer: PPO | Admitting: Adult Health

## 2018-02-22 ENCOUNTER — Encounter: Payer: Self-pay | Admitting: Adult Health

## 2018-02-22 DIAGNOSIS — G8929 Other chronic pain: Secondary | ICD-10-CM | POA: Diagnosis not present

## 2018-02-22 DIAGNOSIS — S72001A Fracture of unspecified part of neck of right femur, initial encounter for closed fracture: Secondary | ICD-10-CM | POA: Diagnosis not present

## 2018-02-22 DIAGNOSIS — J45901 Unspecified asthma with (acute) exacerbation: Secondary | ICD-10-CM | POA: Diagnosis not present

## 2018-02-22 DIAGNOSIS — E785 Hyperlipidemia, unspecified: Secondary | ICD-10-CM | POA: Diagnosis not present

## 2018-02-22 DIAGNOSIS — M546 Pain in thoracic spine: Secondary | ICD-10-CM | POA: Diagnosis not present

## 2018-02-22 DIAGNOSIS — I509 Heart failure, unspecified: Secondary | ICD-10-CM | POA: Insufficient documentation

## 2018-02-22 DIAGNOSIS — K5909 Other constipation: Secondary | ICD-10-CM | POA: Diagnosis not present

## 2018-02-22 DIAGNOSIS — D649 Anemia, unspecified: Secondary | ICD-10-CM | POA: Diagnosis not present

## 2018-02-22 DIAGNOSIS — J441 Chronic obstructive pulmonary disease with (acute) exacerbation: Secondary | ICD-10-CM | POA: Diagnosis not present

## 2018-02-22 NOTE — Progress Notes (Signed)
Location:   [place] Room Number: 207B Place of Service:  SNF (31)   CODE STATUS: FULL  Allergies  Allergen Reactions  . Codeine Nausea And Vomiting  . Lidocaine Swelling    Chief Complaint  Patient presents with  . Medical Management of Chronic Issues    Acute exacerbation of copd with asthma; anemia hyperlipidemia. Weekly follow up for the first 30 days post hospitalization     HPI:  She is a 82 year old short term rehab patient of this facility being seen for the management of her chronic  illnesses: acuate exacerbation of copd with asthma; anemia; hyperlipidemia. She is complaining of a loose nonproductive cough; increased shortness of breath and low grade fevers. She reports that she usually requires a prednisone taper and abt.  She continues to participate with therapy.   Past Medical History:  Diagnosis Date  . Acute thoracic back pain   . Asthma   . Chest pain    unspecified  . CHF (congestive heart failure) ([place]) 04/30/2017  . Chronic abdominal pain 05/01/2017   unspecified  . Congenital heart failure ([place])   . COPD (chronic obstructive pulmonary disease) ([place])   . Disease of upper respiratory system 04/30/2017  . Dyspnea on exertion   . Esophageal reflux disease 04/30/2017  . History of bone density study 06/28/2004  . Leg swelling   . Lumbar arthropathy   . Nasopharyngitis 04/30/2017  . Osteoarthropathy 04/30/2017  . Osteoporosis 04/30/2017  . Palpitations   . Sinusitis, acute 04/30/2017   unspecified    Past Surgical History:  Procedure Laterality Date  . CATARACT EXTRACTION     07/21/2001 - 07/20/2002  . CHOLECYSTECTOMY     07/21/1997 - 07/20/1998  . COLON SURGERY    . COLONOSCOPY     05/05/2000, 01/17/2000 Adenomatous Polyps   . COLONOSCOPY     11/05/2009, 12/23/2004, 07/07/2001   PH Adenomatous Polyps: CBF 10/2014; recall ltr mailed 09/18/2014 (dw)  . ESOPHAGOGASTRODUODENOSCOPY     11/05/2009, 05/05/2000  .  FLEXIBLE SIGMOIDOSCOPY  11/28/1999  . HAND SURGERY  2006  . HERNIA REPAIR    . HIP ARTHROPLASTY Right 02/11/2018   Procedure: ARTHROPLASTY BIPOLAR HIP (HEMIARTHROPLASTY);  Surgeon: Corky Mull, MD;  Location: [facility] ORS;  Service: Orthopedics;  Laterality: Right;  . LAPAROSCOPIC COLON RESECTION     2001    Social History   Socioeconomic History  . Marital status: Divorced    Spouse name: Not on file  . Number of children: Not on file  . Years of education: Not on file  . Highest education level: Not on file  Occupational History  . Not on file  Social Needs  . Financial resource strain: Not on file  . Food insecurity:    Worry: Not on file    Inability: Not on file  . Transportation needs:    Medical: Not on file    Non-medical: Not on file  Tobacco Use  . Smoking status: Never Smoker  . Smokeless tobacco: Never Used  Substance and Sexual Activity  . Alcohol use: Never    Frequency: Never  . Drug use: Never  . Sexual activity: Not on file  Lifestyle  . Physical activity:    Days per week: Not on file    Minutes per session: Not on file  . Stress: Not on file  Relationships  . Social connections:    Talks on phone: Not on file    Gets together: Not  on file    Attends religious service: Not on file    Active member of club or organization: Not on file    Attends meetings of clubs or organizations: Not on file    Relationship status: Not on file  . Intimate partner violence:    Fear of current or ex partner: Not on file    Emotionally abused: Not on file    Physically abused: Not on file    Forced sexual activity: Not on file  Other Topics Concern  . Not on file  Social History Narrative  . Not on file   Family History  Problem Relation Age of Onset  . Heart disease Mother   . Heart disease Father   . Hypertension Son   . Hypertension Daughter       VITAL SIGNS BP (!) 118/45   Pulse 86   Temp 99 F (37.2 C) (Oral)   Resp (!) 22   Ht 5\' 9"  (1.753  m)   Wt 177 lb 3.2 oz (80.4 kg)   SpO2 98%   BMI 26.17 kg/m   Outpatient Encounter Medications as of 02/22/2018  Medication Sig  . acetaminophen (TYLENOL) 325 MG tablet Take 1-2 tablets (325-650 mg total) by mouth every 6 (six) hours as needed for mild pain (pain score 1-3 or temp > 100.5).  Marland Kitchen albuterol (PROVENTIL) (2.5 MG/3ML) 0.083% nebulizer solution Inhale 1 ampule into the lungs 4 (four) times daily.   . diazepam (VALIUM) 2 MG tablet Take 1 tablet (2 mg total) by mouth 2 (two) times daily as needed for muscle spasms.  Marland Kitchen docusate sodium (COLACE) 100 MG capsule Take 1 capsule (100 mg total) by mouth 2 (two) times daily.  . DULoxetine (CYMBALTA) 60 MG capsule Take 1 capsule by mouth 2 (two) times daily.   Marland Kitchen enoxaparin (LOVENOX) 40 MG/0.4ML injection Inject 40 mg into the skin daily.  . Ferrous Sulfate 142 (45 Fe) MG TBCR Take 1 tablet by mouth daily.  . fluticasone (FLONASE) 50 MCG/ACT nasal spray Place 2 sprays into the nose daily.   . Fluticasone-Salmeterol (ADVAIR) 250-50 MCG/DOSE AEPB Inhale 1 puff into the lungs 2 (two) times daily.  Marland Kitchen guaiFENesin-dextromethorphan (ROBITUSSIN DM) 100-10 MG/5ML syrup Take 5 mLs by mouth every 6 (six) hours as needed for cough.  Marland Kitchen Lifitegrast (XIIDRA) 5 % SOLN Place 1 drop into both eyes 2 (two) times daily. for dry eyes  RESIDENT SUPPLY FROM HOME BOTTOM DRAWER OF MED CART  . magnesium hydroxide (MILK OF MAGNESIA) 400 MG/5ML suspension Take 30 mLs by mouth daily as needed for mild constipation.  . montelukast (SINGULAIR) 10 MG tablet Take 1 tablet by mouth daily.   Marland Kitchen omeprazole (PRILOSEC) 40 MG capsule Take 1 capsule by mouth daily.   . ondansetron (ZOFRAN) 4 MG tablet Take 1 tablet (4 mg total) by mouth every 6 (six) hours as needed for nausea.  Marland Kitchen oxyCODONE (OXY IR/ROXICODONE) 5 MG immediate release tablet Take 1-2 tablets (5-10 mg total) by mouth every 4 (four) hours as needed for moderate pain, severe pain or breakthrough pain.  . simvastatin (ZOCOR) 40  MG tablet Take 1 tablet by mouth daily.   . [DISCONTINUED] albuterol (PROVENTIL HFA;VENTOLIN HFA) 108 (90 Base) MCG/ACT inhaler Inhale 2 puffs into the lungs every 6 (six) hours as needed for wheezing or shortness of breath.  . [DISCONTINUED] enoxaparin (LOVENOX) 30 MG/0.3ML injection Inject 0.64 mLs (64 mg total) into the skin every 12 (twelve) hours.  . [DISCONTINUED] Fe Fum-Fe Poly-Vit  C-Lactobac (FUSION) 65-65-25-30 MG CAPS Take 1 capsule by mouth daily.   No facility-administered encounter medications on file as of 02/22/2018.      SIGNIFICANT DIAGNOSTIC EXAMS  LABS REVIEWED TODAY:   02-11-18: wbc 10.6; hgb 12.8; hct 36.9; mcv 97.9; plt 234; glucose 112 ;bun 11; creat 0.69; k+ 3.7; na++ 145; ca 8.5; liver normal albumin 3.6; tsh 4.254 02-13-18: glucose 115; bun 16; creat 0.93; k+ 3.7; na++ 139    Review of Systems  Constitutional: Negative for malaise/fatigue.  Respiratory: Positive for cough, sputum production, shortness of breath and wheezing.   Cardiovascular: Negative for chest pain, palpitations and leg swelling.  Gastrointestinal: Negative for abdominal pain, constipation and heartburn.  Musculoskeletal: Negative for back pain, joint pain and myalgias.  Skin: Negative.   Neurological: Negative for dizziness.  Psychiatric/Behavioral: The patient is not nervous/anxious.     Physical Exam  Constitutional: She is oriented to person, place, and time. She appears well-developed and well-nourished. No distress.  Neck: No thyromegaly present.  Cardiovascular: Normal rate, regular rhythm, normal heart sounds and intact distal pulses.  Pulmonary/Chest: Effort normal. No respiratory distress. She has wheezes. She has rales.  02 dependent Has rhonchi   Abdominal: Soft. Bowel sounds are normal. She exhibits no distension. There is no tenderness.  Musculoskeletal: She exhibits no edema.  Status post right hip fracture/hemiorthoplasty  Is able to move all extremities   Lymphadenopathy:     She has no cervical adenopathy.  Neurological: She is alert and oriented to person, place, and time.  Skin: Skin is warm and dry. She is not diaphoretic.  Psychiatric: She has a normal mood and affect.       ASSESSMENT/ PLAN:  TODAY:   1. Acute exacerbation of  COPD with asthma: is worse; is 02 dependent; will continue advair 250/50 twice daily albuterol neb four times daily singulair 10 mg daily floanse daily   Will begin prednisone taper 50 mg daily for 2 days then decreased by 10 mg daily until complete; will begin Zpack as directed and will monitor   2. Chronic anemia: is stable hgb 12.8; will continue iron daily   3. Hyperlipidemia: is stable will continue zocor 20 mg daily   4. Chronic constipation: is stable will continue colace twice daily   5. Chronic thoracic bilateral back pain: is stable will continue cymbalta 60 mg twice daily   6. Closed displaced fracture of right femoral neck: is stable will continue therapy as directed and will follow up with orthopedics. Will continue oxycodone 5 or 10 mg every 4 hours as needed for pain and will continue lovenox therapy through 02-27-18.    MD is aware of resident's narcotic use and is in agreement with current plan of care. We will attempt to wean resident as apropriate   Ok Edwards NP [facility] Adult Medicine  Contact [phone] Monday through Friday 8am- 5pm  After hours call [phone]

## 2018-02-23 ENCOUNTER — Other Ambulatory Visit: Payer: Self-pay | Admitting: *Deleted

## 2018-02-23 NOTE — Patient Outreach (Addendum)
[facility]) Care Management  02/23/2018  Teresa Chambers 31-Jan-1936 [id number]   Post Acute visit to [facility]. Patient admitted to [facility] following a hospitalization for a fractured femur. Patient previously active with [name] Pharmacist for medication assistance. This Education officer, museum spoke briefly with discharge planner, Geralyn Flash who has no update on patient's discharge plan at this time.  Patient reports having a positive support system. [facility] care management discussed as well as purpose of visit, Patient continues to be in agreement to receive case management services. Patient has 5 children, 3 of which  live out of town, 2 live in the [place] area. Patient's son is retired and his wife is a NP, they will be checking on patient as well as neighbor. Patient is active in PT and OT, currently working on strengthening her upper body and right leg. Patient also working on walking up and down stairs and completing her ADL's safely and independently. Patient able to walk with with walker. Patient also has COPD and states that her heart beat and oxygen will go low. Patient prescribed breathing treatments 4 times a day. No discharge date yet.  Family meeting completed this morning. Patient's son, daughter in law, and neighbor in attendance.  No discharge date discussed, however patient will discharge with [place]. Patient has a walker, a rolator walker and will be getting side rails for her bed to avoid falls. Patient reports being able to complete her own ADL's.  Plan: This Education officer, museum will continue to follow patient's progress while in therapy within 2 weeks. Patient in agreement to have [place] [facility] care management services post discharge from rehab.   Sheralyn Boatman [facility] Care Management [phone]

## 2018-02-24 ENCOUNTER — Other Ambulatory Visit: Payer: Self-pay | Admitting: *Deleted

## 2018-02-24 NOTE — Patient Outreach (Signed)
[facility]) Care Management  02/24/2018  Teresa Chambers [birth year] [id number]   Post acute coordination. Secure e-mail received from discharge planner Teresa Chambers at [facility] and [place]. Per Teresa Chambers, pateint is progressing well toward PT and OT goals. Safety concerns discussed regarding patient having a large dog and avoiding falls.  Patient sleeps in a twin size bed,however falls frequenty getting rails for the bed recommended.Patient's son to work on obtaining this.  Per Teresa Chambers, patient provided with resources for a med-alert system and privae duty care agencies. There is no discharge date set yet, however patient will discharge home with [facility].   Teresa Chambers [facility] Care Management [phone]

## 2018-03-01 ENCOUNTER — Encounter: Payer: Self-pay | Admitting: Adult Health

## 2018-03-01 ENCOUNTER — Non-Acute Institutional Stay (SKILLED_NURSING_FACILITY): Payer: PPO | Admitting: Adult Health

## 2018-03-01 ENCOUNTER — Other Ambulatory Visit: Payer: Self-pay | Admitting: Licensed Clinical Social Worker

## 2018-03-01 DIAGNOSIS — H04123 Dry eye syndrome of bilateral lacrimal glands: Secondary | ICD-10-CM

## 2018-03-01 DIAGNOSIS — K219 Gastro-esophageal reflux disease without esophagitis: Secondary | ICD-10-CM

## 2018-03-01 DIAGNOSIS — J449 Chronic obstructive pulmonary disease, unspecified: Secondary | ICD-10-CM

## 2018-03-01 DIAGNOSIS — K5909 Other constipation: Secondary | ICD-10-CM | POA: Diagnosis not present

## 2018-03-01 DIAGNOSIS — M546 Pain in thoracic spine: Secondary | ICD-10-CM

## 2018-03-01 DIAGNOSIS — D649 Anemia, unspecified: Secondary | ICD-10-CM | POA: Diagnosis not present

## 2018-03-01 DIAGNOSIS — J45909 Unspecified asthma, uncomplicated: Secondary | ICD-10-CM | POA: Diagnosis not present

## 2018-03-01 DIAGNOSIS — S72001A Fracture of unspecified part of neck of right femur, initial encounter for closed fracture: Secondary | ICD-10-CM

## 2018-03-01 DIAGNOSIS — E785 Hyperlipidemia, unspecified: Secondary | ICD-10-CM

## 2018-03-01 DIAGNOSIS — G8929 Other chronic pain: Secondary | ICD-10-CM | POA: Diagnosis not present

## 2018-03-01 NOTE — Progress Notes (Signed)
Location:   [place] Room Number: 207B Place of Service:  SNF (31)   CODE STATUS: FULL  Allergies  Allergen Reactions  . Codeine Nausea And Vomiting  . Lidocaine Swelling    Chief Complaint  Patient presents with  . Medical Management of Chronic Issues    Copd, asthma; constipation right femoral neck fracture. Weekly follow up for the first 30 days post hospitalization     HPI:  She is 82 year old short term rehab patient being seen for the management of her chronic illnesses; copd; asthma; constipation; right femoral neck fracture. She denies cough or shortness of breath and no wheezing. There are no nursing concerns at this time.   Past Medical History:  Diagnosis Date  . Acute thoracic back pain   . Asthma   . Chest pain    unspecified  . CHF (congestive heart failure) ([place]) 04/30/2017  . Chronic abdominal pain 05/01/2017   unspecified  . Congenital heart failure ([place])   . COPD (chronic obstructive pulmonary disease) ([place])   . Disease of upper respiratory system 04/30/2017  . Dyspnea on exertion   . Esophageal reflux disease 04/30/2017  . History of bone density study 06/28/2004  . Leg swelling   . Lumbar arthropathy   . Nasopharyngitis 04/30/2017  . Osteoarthropathy 04/30/2017  . Osteoporosis 04/30/2017  . Palpitations   . Sinusitis, acute 04/30/2017   unspecified    Past Surgical History:  Procedure Laterality Date  . CATARACT EXTRACTION     07/21/2001 - 07/20/2002  . CHOLECYSTECTOMY     07/21/1997 - 07/20/1998  . COLON SURGERY    . COLONOSCOPY     05/05/2000, 01/17/2000 Adenomatous Polyps   . COLONOSCOPY     11/05/2009, 12/23/2004, 07/07/2001   PH Adenomatous Polyps: [facility]BF 10/2014; recall ltr mailed 09/18/2014 (dw)  . ESOPHAGOGASTRODUODENOSCOPY     11/05/2009, 05/05/2000  . FLEXIBLE SIGMOIDOSCOPY  11/28/1999  . HAND SURGERY  2006  . HERNIA REPAIR    . HIP ARTHROPLASTY Right 02/11/2018   Procedure: ARTHROPLASTY BIPOLAR HIP  (HEMIARTHROPLASTY);  Surgeon: Corky Mull, MD;  Location: [facility] ORS;  Service: Orthopedics;  Laterality: Right;  . LAPAROSCOPIC COLON RESECTION     2001    Social History   Socioeconomic History  . Marital status: Divorced    Spouse name: Not on file  . Number of children: Not on file  . Years of education: Not on file  . Highest education level: Not on file  Occupational History  . Not on file  Social Needs  . Financial resource strain: Not on file  . Food insecurity:    Worry: Not on file    Inability: Not on file  . Transportation needs:    Medical: Not on file    Non-medical: Not on file  Tobacco Use  . Smoking status: Never Smoker  . Smokeless tobacco: Never Used  Substance and Sexual Activity  . Alcohol use: Never    Frequency: Never  . Drug use: Never  . Sexual activity: Not on file  Lifestyle  . Physical activity:    Days per week: Not on file    Minutes per session: Not on file  . Stress: Not on file  Relationships  . Social connections:    Talks on phone: Not on file    Gets together: Not on file    Attends religious service: Not on file    Active member of club or organization: Not on file  Attends meetings of clubs or organizations: Not on file    Relationship status: Not on file  . Intimate partner violence:    Fear of current or ex partner: Not on file    Emotionally abused: Not on file    Physically abused: Not on file    Forced sexual activity: Not on file  Other Topics Concern  . Not on file  Social History Narrative  . Not on file   Family History  Problem Relation Age of Onset  . Heart disease Mother   . Heart disease Father   . Hypertension Son   . Hypertension Daughter       VITAL SIGNS BP 140/64   Pulse 82   Temp 98.3 F (36.8 C) (Oral)   Resp 18   Ht 5\' 9"  (1.753 m)   Wt 178 lb 11.2 oz (81.1 kg)   SpO2 94%   BMI 26.39 kg/m   Outpatient Encounter Medications as of 03/01/2018  Medication Sig  . acetaminophen  (TYLENOL) 325 MG tablet Take 1-2 tablets (325-650 mg total) by mouth every 6 (six) hours as needed for mild pain (pain score 1-3 or temp > 100.5).  Marland Kitchen albuterol (PROVENTIL HFA;VENTOLIN HFA) 108 (90 Base) MCG/ACT inhaler Inhale 2 puffs into the lungs every 6 (six) hours as needed for wheezing or shortness of breath.  Marland Kitchen albuterol (PROVENTIL) (2.5 MG/3ML) 0.083% nebulizer solution Inhale 1 ampule into the lungs 4 (four) times daily.   . diazepam (VALIUM) 2 MG tablet Take 1 tablet (2 mg total) by mouth 2 (two) times daily as needed for muscle spasms.  Marland Kitchen docusate sodium (COLACE) 100 MG capsule Take 1 capsule (100 mg total) by mouth 2 (two) times daily.  . DULoxetine (CYMBALTA) 60 MG capsule Take 1 capsule by mouth 2 (two) times daily.   . Ferrous Sulfate 142 (45 Fe) MG TBCR Take 1 tablet by mouth daily.  . fluticasone (FLONASE) 50 MCG/ACT nasal spray Place 2 sprays into the nose daily.   . Fluticasone-Salmeterol (ADVAIR) 250-50 MCG/DOSE AEPB Inhale 1 puff into the lungs 2 (two) times daily.  Marland Kitchen guaiFENesin-dextromethorphan (ROBITUSSIN DM) 100-10 MG/5ML syrup Take 5 mLs by mouth every 6 (six) hours as needed for cough.  Marland Kitchen Lifitegrast (XIIDRA) 5 % SOLN Place 1 drop into both eyes 2 (two) times daily. for dry eyes  RESIDENT SUPPLY FROM HOME BOTTOM DRAWER OF MED CART  . magnesium hydroxide (MILK OF MAGNESIA) 400 MG/5ML suspension Take 30 mLs by mouth daily as needed for mild constipation.  . montelukast (SINGULAIR) 10 MG tablet Take 1 tablet by mouth daily.   Marland Kitchen omeprazole (PRILOSEC) 40 MG capsule Take 1 capsule by mouth 2 (two) times daily.   . ondansetron (ZOFRAN) 4 MG tablet Take 1 tablet (4 mg total) by mouth every 6 (six) hours as needed for nausea.  Marland Kitchen oxyCODONE (OXY IR/ROXICODONE) 5 MG immediate release tablet Take 1-2 tablets (5-10 mg total) by mouth every 4 (four) hours as needed for moderate pain, severe pain or breakthrough pain.  . OXYGEN Inhale 2 L into the lungs every 4 (four) hours as needed. For  dyspnea Check O2 saturations prior to placing on patient and at least every 4 hours after applying. Notify MD if oxygen saturations drop below baseline while receiving oxygen or no improvement in dyspnea  . simvastatin (ZOCOR) 40 MG tablet Take 1 tablet by mouth daily.   Marland Kitchen UNABLE TO FIND Diet Type:NAS heart healthy   No facility-administered encounter medications on file as  of 03/01/2018.      SIGNIFICANT DIAGNOSTIC EXAMS  LABS REVIEWED PREVIOUS:   02-11-18: wbc 10.6; hgb 12.8; hct 36.9; mcv 97.9; plt 234; glucose 112 ;bun 11; creat 0.69; k+ 3.7; na++ 145; ca 8.5; liver normal albumin 3.6; tsh 4.254 02-13-18: glucose 115; bun 16; creat 0.93; k+ 3.7; na++ 139   NO NEW LABS.    Review of Systems  Constitutional: Negative for malaise/fatigue.  Respiratory: Negative for cough and shortness of breath.   Cardiovascular: Negative for chest pain, palpitations and leg swelling.  Gastrointestinal: Negative for abdominal pain, constipation and heartburn.  Musculoskeletal: Negative for back pain, joint pain and myalgias.  Skin: Negative.   Neurological: Negative for dizziness.  Psychiatric/Behavioral: The patient is not nervous/anxious.     Physical Exam  Constitutional: She is oriented to person, place, and time. She appears well-developed and well-nourished. No distress.  Neck: No thyromegaly present.  Cardiovascular: Normal rate, regular rhythm, normal heart sounds and intact distal pulses.  Pulmonary/Chest: Effort normal and breath sounds normal. No respiratory distress.  02 dependent   Abdominal: Soft. Bowel sounds are normal. She exhibits no distension. There is no tenderness.  Musculoskeletal: She exhibits no edema.  Status post right hip fracture/hemiorthoplasty  Is able to move all extremities    Lymphadenopathy:    She has no cervical adenopathy.  Neurological: She is alert and oriented to person, place, and time.  Skin: Skin is warm and dry. She is not diaphoretic.  Right hip  incision without signs of infection present.   Psychiatric: She has a normal mood and affect.     ASSESSMENT/ PLAN:  TODAY:   1. Acute exacerbation of  COPD with asthma: is stable ; is 02 dependent; will continue advair 250/50 twice daily albuterol neb four times daily singulair 10 mg daily floanse daily     2. Chronic anemia: is stable hgb 12.8; will continue iron daily   3. Hyperlipidemia: is stable will continue zocor 40 mg daily   4. Chronic constipation: is stable will continue colace twice daily   5. Chronic thoracic bilateral back pain: is stable will continue cymbalta 60 mg twice daily   6. Closed displaced fracture of right femoral neck: is stable will continue therapy as directed and will follow up with orthopedics. Will continue oxycodone 5 or 10 mg every 4 hours as needed for pain has valium 2 mg twice daily as needed for spasms  7. GERD without esophagitis: is stable will continue prilosec 40 mg twice daily   8. Bilateral dry eyes: stable will continue xidra 5% to both eyes twice daily       MD is aware of resident's narcotic use and is in agreement with current plan of care. We will attempt to wean resident as apropriate   Ok Edwards NP [facility]  Contact [phone] Monday through Friday 8am- 5pm  After hours call [phone]

## 2018-03-01 NOTE — Patient Outreach (Signed)
[facility]) Care Management  03/01/2018  Teresa Chambers September 24, 1935 [id number]  [facility] CSW is currently covering for [place] this week. [facility] CSW completed call to [facility] social worker and was able to reach her successfully. Maudie Mercury reports that there are no current updates as of now and no scheduled discharge date but that Maudie Mercury will be out of the office this week from 03/03/18-03/05/18 but will ensure that her covering social worker will contact this [place] will any important updates on patient and her expected discharge date.   Eula Fried, BSW, MSW, [email or web address] Phone: [phone] Fax: [phone]

## 2018-03-04 ENCOUNTER — Other Ambulatory Visit: Payer: Self-pay | Admitting: Adult Health

## 2018-03-04 ENCOUNTER — Encounter: Payer: Self-pay | Admitting: Adult Health

## 2018-03-04 ENCOUNTER — Other Ambulatory Visit: Payer: Self-pay

## 2018-03-04 ENCOUNTER — Non-Acute Institutional Stay (SKILLED_NURSING_FACILITY): Payer: PPO | Admitting: Adult Health

## 2018-03-04 DIAGNOSIS — S72001S Fracture of unspecified part of neck of right femur, sequela: Secondary | ICD-10-CM

## 2018-03-04 DIAGNOSIS — F339 Major depressive disorder, recurrent, unspecified: Secondary | ICD-10-CM | POA: Diagnosis not present

## 2018-03-04 DIAGNOSIS — K2100 Gastro-esophageal reflux disease with esophagitis, without bleeding: Secondary | ICD-10-CM

## 2018-03-04 DIAGNOSIS — J449 Chronic obstructive pulmonary disease, unspecified: Secondary | ICD-10-CM | POA: Diagnosis not present

## 2018-03-04 DIAGNOSIS — J309 Allergic rhinitis, unspecified: Secondary | ICD-10-CM | POA: Diagnosis not present

## 2018-03-04 DIAGNOSIS — E785 Hyperlipidemia, unspecified: Secondary | ICD-10-CM | POA: Diagnosis not present

## 2018-03-04 DIAGNOSIS — J45909 Unspecified asthma, uncomplicated: Secondary | ICD-10-CM

## 2018-03-04 MED ORDER — ALBUTEROL SULFATE HFA 108 (90 BASE) MCG/ACT IN AERS: 2.0000 | INHALATION_SPRAY | Freq: Four times a day (QID) | RESPIRATORY_TRACT | 0 refills | Status: DC | PRN

## 2018-03-04 MED ORDER — MONTELUKAST SODIUM 10 MG PO TABS
10.0000 mg | ORAL_TABLET | Freq: Every day | ORAL | 0 refills | Status: DC
Start: 2018-03-04 — End: 2020-04-09

## 2018-03-04 MED ORDER — ONDANSETRON HCL 4 MG PO TABS: 4.0000 mg | ORAL_TABLET | Freq: Four times a day (QID) | ORAL | 0 refills | Status: DC | PRN

## 2018-03-04 MED ORDER — ALBUTEROL SULFATE (2.5 MG/3ML) 0.083% IN NEBU: 2.5000 mg | INHALATION_SOLUTION | Freq: Four times a day (QID) | RESPIRATORY_TRACT | 0 refills | Status: DC

## 2018-03-04 MED ORDER — FLUTICASONE-SALMETEROL 250-50 MCG/DOSE IN AEPB: 1.0000 | INHALATION_SPRAY | Freq: Two times a day (BID) | RESPIRATORY_TRACT | 0 refills | Status: DC

## 2018-03-04 MED ORDER — SIMVASTATIN 40 MG PO TABS: 40.0000 mg | ORAL_TABLET | Freq: Every day | ORAL | 0 refills | Status: DC

## 2018-03-04 MED ORDER — LIFITEGRAST 5 % OP SOLN: 1.0000 [drp] | Freq: Two times a day (BID) | OPHTHALMIC | 0 refills | Status: DC

## 2018-03-04 MED ORDER — DULOXETINE HCL 60 MG PO CPEP: 60.0000 mg | ORAL_CAPSULE | Freq: Two times a day (BID) | ORAL | 0 refills | Status: DC

## 2018-03-04 MED ORDER — OMEPRAZOLE 40 MG PO CPDR: 40.0000 mg | DELAYED_RELEASE_CAPSULE | Freq: Two times a day (BID) | ORAL | 0 refills | Status: DC

## 2018-03-04 MED ORDER — FLUTICASONE PROPIONATE 50 MCG/ACT NA SUSP: 2.0000 | Freq: Every day | NASAL | 0 refills | Status: DC

## 2018-03-04 MED ORDER — GUAIFENESIN-DM 100-10 MG/5ML PO SYRP: 5.0000 mL | ORAL_SOLUTION | Freq: Four times a day (QID) | ORAL | 0 refills | Status: DC | PRN

## 2018-03-04 NOTE — Progress Notes (Signed)
Location:  [facility] Number: [place]:  SNF (31) Provider:  Durenda Age, NP  Patient Care Team: Cletis Athens, MD as PCP - General (Internal Medicine) Vern Claude, LCSW as [place] Management  Extended Emergency Contact Information Primary Emergency Contact: Dossie Arbour Mobile Phone: [phone] Relation: Neighbor  Code Status:  FULL  Goals of care: Advanced Directive information Advanced Directives 03/04/2018  Does Patient Have a Medical Advance Directive? No  Type of Advance Directive -  Does patient want to make changes to medical advance directive? No - Patient declined  Copy of [place] in Chart? -  Would patient like information on creating a medical advance directive? -     Chief Complaint  Patient presents with  . Discharge Note    Discharging from SNF on 03/06/2018    HPI:  Pt is a 82 y.o. female seen today for a discharge visit. She will discharge home with Home health PT, OT, and Nursing.   She has been admitted to [facility] on 02/13/18 from a recent hospitalization. She had a fall from her bed at home from which she sustained a right femoral neck fracture. She had arthroplasty bipolar hip (hemiarthroplasty), right, on 02/11/18. She has PMH of COPD, asthma and CHF.  Patient was admitted to this facility for short-term rehabilitation after the patient's recent hospitalization.  Patient has completed SNF rehabilitation and therapy has cleared the patient for discharge.    Past Medical History:  Diagnosis Date  . Acute thoracic back pain   . Asthma   . Chest pain    unspecified  . CHF (congestive heart failure) ([place]) 04/30/2017  . Chronic abdominal pain 05/01/2017   unspecified  . Congenital heart failure ([place])   . COPD (chronic obstructive pulmonary disease) ([place])   . Disease of upper respiratory system 04/30/2017  . Dyspnea on exertion     . Esophageal reflux disease 04/30/2017  . History of bone density study 06/28/2004  . Leg swelling   . Lumbar arthropathy   . Nasopharyngitis 04/30/2017  . Osteoarthropathy 04/30/2017  . Osteoporosis 04/30/2017  . Palpitations   . Sinusitis, acute 04/30/2017   unspecified   Past Surgical History:  Procedure Laterality Date  . CATARACT EXTRACTION     07/21/2001 - 07/20/2002  . CHOLECYSTECTOMY     07/21/1997 - 07/20/1998  . COLON SURGERY    . COLONOSCOPY     05/05/2000, 01/17/2000 Adenomatous Polyps   . COLONOSCOPY     11/05/2009, 12/23/2004, 07/07/2001   PH Adenomatous Polyps: CBF 10/2014; recall ltr mailed 09/18/2014 (dw)  . ESOPHAGOGASTRODUODENOSCOPY     11/05/2009, 05/05/2000  . FLEXIBLE SIGMOIDOSCOPY  11/28/1999  . HAND SURGERY  2006  . HERNIA REPAIR    . HIP ARTHROPLASTY Right 02/11/2018   Procedure: ARTHROPLASTY BIPOLAR HIP (HEMIARTHROPLASTY);  Surgeon: Corky Mull, MD;  Location: [facility] ORS;  Service: Orthopedics;  Laterality: Right;  . LAPAROSCOPIC COLON RESECTION     2001    Allergies  Allergen Reactions  . Codeine Nausea And Vomiting  . Lidocaine Swelling    Outpatient Encounter Medications as of 03/04/2018  Medication Sig  . acetaminophen (TYLENOL) 325 MG tablet Take 1-2 tablets (325-650 mg total) by mouth every 6 (six) hours as needed for mild pain (pain score 1-3 or temp > 100.5).  Marland Kitchen albuterol (PROVENTIL HFA;VENTOLIN HFA) 108 (90 Base) MCG/ACT inhaler Inhale 2 puffs into the lungs every 6 (six) hours  as needed for wheezing or shortness of breath.  Marland Kitchen albuterol (PROVENTIL) (2.5 MG/3ML) 0.083% nebulizer solution Inhale 1 ampule into the lungs 4 (four) times daily.   . diazepam (VALIUM) 2 MG tablet Take 1 tablet (2 mg total) by mouth 2 (two) times daily as needed for muscle spasms.  Marland Kitchen docusate sodium (COLACE) 100 MG capsule Take 1 capsule (100 mg total) by mouth 2 (two) times daily.  . DULoxetine (CYMBALTA) 60 MG capsule Take 1 capsule by mouth 2 (two) times daily.    . Ferrous Sulfate 142 (45 Fe) MG TBCR Take 1 tablet by mouth daily.  . fluticasone (FLONASE) 50 MCG/ACT nasal spray Place 2 sprays into the nose daily.   . Fluticasone-Salmeterol (ADVAIR) 250-50 MCG/DOSE AEPB Inhale 1 puff into the lungs 2 (two) times daily. RINSE MOUTH AFTER USE  . guaiFENesin-dextromethorphan (ROBITUSSIN DM) 100-10 MG/5ML syrup Take 5 mLs by mouth every 6 (six) hours as needed for cough.  Marland Kitchen Lifitegrast (XIIDRA) 5 % SOLN Place 1 drop into both eyes 2 (two) times daily. for dry eyes  RESIDENT SUPPLY FROM HOME BOTTOM DRAWER OF MED CART  . magnesium hydroxide (MILK OF MAGNESIA) 400 MG/5ML suspension Take 30 mLs by mouth daily as needed for mild constipation.  . montelukast (SINGULAIR) 10 MG tablet Take 1 tablet by mouth daily.   Marland Kitchen omeprazole (PRILOSEC) 40 MG capsule Take 1 capsule by mouth 2 (two) times daily.   . ondansetron (ZOFRAN) 4 MG tablet Take 1 tablet (4 mg total) by mouth every 6 (six) hours as needed for nausea.  Marland Kitchen oxyCODONE (OXY IR/ROXICODONE) 5 MG immediate release tablet Take 1-2 tablets (5-10 mg total) by mouth every 4 (four) hours as needed for moderate pain, severe pain or breakthrough pain.  . OXYGEN Inhale 2 L into the lungs every 4 (four) hours as needed. For dyspnea Check O2 saturations prior to placing on patient and at least every 4 hours after applying. Notify MD if oxygen saturations drop below baseline while receiving oxygen or no improvement in dyspnea  . simvastatin (ZOCOR) 40 MG tablet Take 1 tablet by mouth daily.   Marland Kitchen UNABLE TO FIND Diet Type:NAS heart healthy   No facility-administered encounter medications on file as of 03/04/2018.     Review of Systems  GENERAL: No change in appetite, no fatigue, no weight changes, no fever, chills or weakness MOUTH and THROAT: Denies oral discomfort, gingival pain or bleeding RESPIRATORY: +cough CARDIAC: No chest pain, or palpitations GI: No abdominal pain, diarrhea, constipation, heart burn, nausea or  vomiting GU: Denies dysuria, frequency, hematuria,or discharge PSYCHIATRIC: Denies feelings of depression or anxiety. No report of hallucinations, insomnia, paranoia, or agitation   Immunization History  Administered Date(s) Administered  . PPD Test 02/13/2018   Pertinent  Health Maintenance Due  Topic Date Due  . DEXA SCAN  03/29/2003  . PNA vac Low Risk Adult (1 of 2 - PCV13) 03/29/2003  . INFLUENZA VACCINE  02/18/2018   Fall Risk  01/19/2018  Falls in the past year? No   Functional Status Survey:    Vitals:   03/04/18 1042  BP: (!) 109/47  Pulse: 96  Resp: 20  Temp: 98 F (36.7 C)  TempSrc: Oral  SpO2: 98%  Weight: 176 lb 12.8 oz (80.2 kg)  Height: 5\' 9"  (1.753 m)   Body mass index is 26.11 kg/m.  Physical Exam  GENERAL APPEARANCE: Well nourished. In no acute distress. Normal body habitus SKIN:  Right hip with steri-strips, dry and no  erythema HEAD: Normal in size and contour. No evidence of trauma EYES: Lids open and close normally. No blepharitis, entropion or ectropion. PERRL. Conjunctivae are clear and sclerae are white. Lenses are without opacity EARS: Pinnae are normal. Patient hears normal voice tunes of the examiner MOUTH and THROAT: Lips are without lesions. Oral mucosa is moist and without lesions. Tongue is normal in shape, size, and color and without lesions RESPIRATORY: Breathing is even & unlabored, BS CTAB CARDIAC: RRR, no murmur,no extra heart sounds, RLE 1+edema GI: Abdomen soft, normal BS, no masses, no tenderness EXTREMITIES:  Able to move X 4 extremities PSYCHIATRIC: Alert and oriented X 3. Affect and behavior are appropriate   Labs reviewed: Recent Labs    02/11/18 0503 02/12/18 0348 02/13/18 0433  NA 145 140 139  K 3.7 4.0 3.7  CL 109 106 105  CO2 30 29 26   GLUCOSE 112* 131* 115*  BUN 11 11 16   CREATININE 0.69 0.58 0.93  CALCIUM 8.5* 7.9* 8.2*   Recent Labs    02/11/18 0503  AST 24  ALT 20  ALKPHOS 40  BILITOT 0.6  PROT  6.5  ALBUMIN 3.6   Recent Labs    01/19/18 1647 02/11/18 0503 02/12/18 0348  WBC 6.9 10.6 10.9  NEUTROABS  --   --  9.3*  HGB 13.0 12.8 12.2  HCT 38.6 36.9 34.5*  MCV 97.8 97.9 97.2  PLT 267 234 207   Lab Results  Component Value Date   TSH 4.254 02/11/2018   No results found for: HGBA1C Lab Results  Component Value Date   CHOL 170 06/27/2016   HDL 66 06/27/2016   LDLCALC 79 06/27/2016   TRIG 123 06/27/2016   CHOLHDL 2.6 06/27/2016    Significant Diagnostic Results in last 30 days:  Dg Chest 1 View  Result Date: 02/11/2018 CLINICAL DATA:  Patient rule out of bed landing on the right hip. Right hip fracture. EXAM: CHEST  1 VIEW COMPARISON:  01/20/2008 FINDINGS: Normal heart size and pulmonary vascularity. Large calcified granuloma in the left lung base. No airspace disease or consolidation. No blunting of costophrenic angles. No pneumothorax. Calcification of the aorta. IMPRESSION: No evidence of active pulmonary disease. Large calcified granuloma in the left lung base. Aortic atherosclerosis. Electronically Signed   By: Lucienne Capers M.D.   On: 02/11/2018 04:32   Dg Chest 2 View  Result Date: 02/12/2018 CLINICAL DATA:  Hip surgery cough EXAM: CHEST - 2 VIEW COMPARISON:  02/11/2018, 01/19/2018 FINDINGS: Hyperinflation. No acute consolidation or effusion. Stable cardiomediastinal silhouette with aortic atherosclerosis. No pneumothorax. Stable calcified lung nodule at the left base. IMPRESSION: No active cardiopulmonary disease. Electronically Signed   By: Donavan Foil M.D.   On: 02/12/2018 20:13   Dg Hip Unilat W Or W/o Pelvis 2-3 Views Right  Result Date: 02/11/2018 CLINICAL DATA:  Initial evaluation status post right hip replacement. EXAM: DG HIP (WITH OR WITHOUT PELVIS) 2-3V RIGHT COMPARISON:  Prior radiograph from 02/11/2018 FINDINGS: Interval placement of a right total hip arthroplasty. Acetabular and femoral components appear well seated without adverse features. No  periprosthetic fracture or other complication. Postoperative swelling and emphysema overlies the right hip. Degenerative changes noted at the left hip. IMPRESSION: Postoperative changes from interval right total hip arthroplasty without complication. Electronically Signed   By: Jeannine Boga M.D.   On: 02/11/2018 16:59   Dg Hip Unilat  With Pelvis 2-3 Views Right  Result Date: 02/11/2018 CLINICAL DATA:  Patient fell out of bed, landing  on the right hip. EXAM: DG HIP (WITH OR WITHOUT PELVIS) 2-3V RIGHT COMPARISON:  None. FINDINGS: There is a transverse subcapital fracture of the right femoral neck with varus angulation and impaction of the fracture fragments. The pelvis appears intact. SI joints and symphysis pubis are not displaced. Degenerative changes in the lower lumbar spine and in the hips. Surgical clips consistent with mesh abdominal wall hernia repair. IMPRESSION: Subcapital fracture of the right femoral neck with varus angulation and impaction. Electronically Signed   By: Lucienne Capers M.D.   On: 02/11/2018 04:31    Assessment/Plan  1. Closed fracture of right hip, sequela - S/P hemiarthroplasty, right, will have Home health PT, OT, and Nursing, continue Diazepam 2 mg 1 tab BID PRN for muscle spasm, Oxycodone 5 mg 1-2 tabs Q 4 hours PRN and Acetaminophen 325 mg 2 tabs = 650 mg Q 6 hours PRN for pain, follow-up with orthopedics   2. Uncomplicated asthma, unspecified asthma severity, unspecified whether persistent - albuterol (PROVENTIL HFA;VENTOLIN HFA) 108 (90 Base) MCG/ACT inhaler; Inhale 2 puffs into the lungs every 6 (six) hours as needed for wheezing or shortness of breath.  Dispense: 18 g; Refill: 0 - albuterol (PROVENTIL) (2.5 MG/3ML) 0.083% nebulizer solution; Inhale 3 mLs (2.5 mg total) into the lungs 4 (four) times daily.  Dispense: 75 mL; Refill: 0 - montelukast (SINGULAIR) 10 MG tablet; Take 1 tablet (10 mg total) by mouth daily.  Dispense: 30 tablet; Refill: 0  3.  Chronic obstructive pulmonary disease, unspecified COPD type (HCC) - albuterol (PROVENTIL HFA;VENTOLIN HFA) 108 (90 Base) MCG/ACT inhaler; Inhale 2 puffs into the lungs every 6 (six) hours as needed for wheezing or shortness of breath.  Dispense: 18 g; Refill: 0 - albuterol (PROVENTIL) (2.5 MG/3ML) 0.083% nebulizer solution; Inhale 3 mLs (2.5 mg total) into the lungs 4 (four) times daily.  Dispense: 75 mL; Refill: 0 - guaiFENesin-dextromethorphan (ROBITUSSIN DM) 100-10 MG/5ML syrup; Take 5 mLs by mouth every 6 (six) hours as needed for cough.  Dispense: 118 mL; Refill: 0  4. Hyperlipidemia, unspecified hyperlipidemia type - simvastatin (ZOCOR) 40 MG tablet; Take 1 tablet (40 mg total) by mouth daily.  Dispense: 30 tablet; Refill: 0 Lab Results  Component Value Date   CHOL 170 06/27/2016   HDL 66 06/27/2016   LDLCALC 79 06/27/2016   TRIG 123 06/27/2016   CHOLHDL 2.6 06/27/2016    5. Allergic rhinitis, unspecified seasonality, unspecified trigger - fluticasone (FLONASE) 50 MCG/ACT nasal spray; Place 2 sprays into both nostrils daily.  Dispense: 16 g; Refill: 0  6. Major depression, recurrent, chronic (HCC) - mood is stable - DULoxetine (CYMBALTA) 60 MG capsule; Take 1 capsule (60 mg total) by mouth 2 (two) times daily.  Dispense: 60 capsule; Refill: 0  7. Chronic reflux esophagitis - omeprazole (PRILOSEC) 40 MG capsule; Take 1 capsule (40 mg total) by mouth 2 (two) times daily.  Dispense: 60 capsule; Refill: 0    I have filled out patient's discharge paperwork and written prescriptions.  Patient will receive home health PT, OT, and Nursing.  DME provided:  Rolling walker  Total discharge time: Greater than 30 minutes Greater than 50% was spent in counseling and coordination of care.   Discharge time involved coordination of the discharge process with social worker, nursing staff and therapy department. Medical justification for home health services/DME verified.    Durenda Age, NP [facility] [phone] (Monday-Friday 8:00 a.m. - 5:00 p.m.) [phone] (after hours)

## 2018-03-04 NOTE — Telephone Encounter (Signed)
Patient will receive prescription refill upon discharge 03/06/2018 for oxycodone 5 mg tab (1 - 2 tabs po q4 hr prn) 30 total tabs.

## 2018-03-04 NOTE — Telephone Encounter (Signed)
Patient will receive prescription refill upon discharge 03/06/18 for Diazepam 2 mg (1 tab po bid prn)  30 total tablets.

## 2018-03-05 ENCOUNTER — Other Ambulatory Visit: Payer: Self-pay | Admitting: Licensed Clinical Social Worker

## 2018-03-05 NOTE — Patient Outreach (Signed)
[facility]) Care Management  03/05/2018  Teresa Chambers 12-29-1935 [id number]  [name] CSW is currently providing coverage for [facility] CSW. [facility] CSW received missed call and voice message from Liana Crocker, social worker at [facility] who reported that patient will be discharging back home tomorrow on 03/06/18 with [facility] (PT/OT/Nursing) and that she had no DME needs. Patient's last covered SNF day is today. [facility] CSW will sign off and make a referral for [facility] RNCM for transition of calls. [facility] CSW will update [facility] CSW [facility] as well.   Eula Fried, BSW, MSW, [email or web address] Phone: [phone] Fax: [phone]

## 2018-03-08 ENCOUNTER — Other Ambulatory Visit: Payer: Self-pay

## 2018-03-08 NOTE — Patient Outreach (Signed)
[facility]) Care Management  03/08/2018  DONZELLA CARROL 09/11/1935 [id number]   Care Coordination/ Transition of Care: Successful telephone encounter to the above patient after discharge from [facility] 03/07/18. [facility] RN CM introduced role of [facility] Case Management. Verbal consent obtained. Patient states she is doing well. Transition of care documentation initiated but not completed related to time constraints of patient. Still not driving. Daughter assisting will all care as needed. Has follow up appointment scheduled with orthopedic in September. Patient has previous appointment with PCP also scheduled September 20 but RN CM encouraged patient to call PCP today for sooner visit to follow up on SNF discharge.  Initial home visit scheduled for tomorrow, 03/09/18 at 1:00. Initial assessment including transition of care completion will be done at that time.  Plan: Home visit as scheduled above  Vannessa [place] E. Rollene Rotunda RN, BSN [facility] Care Management Coordinator [phone]

## 2018-03-09 ENCOUNTER — Other Ambulatory Visit: Payer: Self-pay

## 2018-03-09 NOTE — Patient Outreach (Signed)
[facility]) Care Management  Transition of Care #1 Assessment/Initial Home Visit 03/09/2018  Teresa Chambers 1936-02-21 [id number]  Teresa Chambers is an 82 y.o. female  Subjective: Patient states she is doing great. Chambers to be back at home. Verbalizes minimal discomforts. States she has not heard from [facility] as of date. States she has obtained a PCP appointment for Thursday 03/11/18 after initial phone conversation with [facility] RN CM yesterday. Says PCP was unaware of patients hospitalization and SNF admission. Patient denies needs for additional community resources other than pharmacy assistance that has already been initiated prior to referral for Community Case Management related to SNF D/C 03/06/18. Acknowledges 24 hour care provided by children for the next 2 weeks and longer if needed. Patient states she has never needed a caregiver and has remained safe and independent however does acknowledge [place]". Daughter states she has ordered patient a bedrail for her bed as patient sleeps extremely close to the side of the bed and sustained current injury from falling off the bed while dreaming. Patient/daughter Teresa Chambers expresses interest in some form of "Life Alert" however they are interested in "one that I can use anywhere I go and one that has GPS tracker". Patient has a Programmer, multimedia at [place] and she is ready to visit. Patient states she has a next door neighbor/best friend that will assist her with any need once children had gone back home.  Objective:   BP 116/64 (BP Location: Right Arm, Patient Position: Sitting, Cuff Size: Normal)   Resp 20   Ht 1.753 m (5\' 9" ) Comment: reported  Wt 170 lb (77.1 kg) Comment: reported weight from rehab  SpO2 99%   BMI 25.10 kg/m   Physical Exam  Constitutional: She is oriented to person, place, and time. She appears well-developed and well-nourished.  Cardiovascular: Normal rate, regular rhythm,  normal heart sounds and intact distal pulses.  Faint pedal pulses bilat  Respiratory: Effort normal and breath sounds normal.  GI: Soft. Bowel sounds are normal.  Musculoskeletal: She exhibits tenderness.  Decreased ROM R hip with tenderness upon palpation  Neurological: She is alert and oriented to person, place, and time.  Skin: Skin is warm and dry.  Incision to right hip approximated, no drainage, intact with 3 steri-strips.   Psychiatric: She has a normal mood and affect. Her behavior is normal. Judgment and thought content normal.    Encounter Medications:   Outpatient Encounter Medications as of 03/09/2018  Medication Sig Note  . acetaminophen (TYLENOL) 325 MG tablet Take 1-2 tablets (325-650 mg total) by mouth every 6 (six) hours as needed for mild pain (pain score 1-3 or temp > 100.5).   Marland Kitchen albuterol (PROVENTIL HFA;VENTOLIN HFA) 108 (90 Base) MCG/ACT inhaler Inhale 2 puffs into the lungs every 6 (six) hours as needed for wheezing or shortness of breath.   Marland Kitchen albuterol (PROVENTIL) (2.5 MG/3ML) 0.083% nebulizer solution Inhale 3 mLs (2.5 mg total) into the lungs 4 (four) times daily.   . Ascorbic Acid (VITAMIN C WITH ROSE HIPS) 1000 MG tablet Take 1,000 mg by mouth daily.   . diazepam (VALIUM) 2 MG tablet Take 1 tablet (2 mg total) by mouth 2 (two) times daily as needed for muscle spasms.   . DULoxetine (CYMBALTA) 60 MG capsule Take 1 capsule (60 mg total) by mouth 2 (two) times daily.   . Ferrous Sulfate (SLOW FE) 142 (45 Fe) MG TBCR Take 45  mg by mouth daily.   . fluticasone (FLONASE) 50 MCG/ACT nasal spray Place 2 sprays into both nostrils daily.   . Fluticasone-Salmeterol (ADVAIR) 250-50 MCG/DOSE AEPB Inhale 1 puff into the lungs 2 (two) times daily. RINSE MOUTH AFTER USE   . guaiFENesin-dextromethorphan (ROBITUSSIN DM) 100-10 MG/5ML syrup Take 5 mLs by mouth every 6 (six) hours as needed for cough.   Marland Kitchen Lifitegrast (XIIDRA) 5 % SOLN Place 1 drop into both eyes 2 (two) times daily.  for dry eyes  RESIDENT SUPPLY FROM HOME BOTTOM DRAWER OF MED CART   . montelukast (SINGULAIR) 10 MG tablet Take 1 tablet (10 mg total) by mouth daily.   Marland Kitchen omeprazole (PRILOSEC) 40 MG capsule Take 1 capsule (40 mg total) by mouth 2 (two) times daily.   . ondansetron (ZOFRAN) 4 MG tablet Take 1 tablet (4 mg total) by mouth every 6 (six) hours as needed for nausea.   Marland Kitchen oxyCODONE (OXY IR/ROXICODONE) 5 MG immediate release tablet Take 1-2 tablets (5-10 mg total) by mouth every 4 (four) hours as needed for moderate pain, severe pain or breakthrough pain.   . simvastatin (ZOCOR) 40 MG tablet Take 1 tablet (40 mg total) by mouth daily.   . Vitamin D, Cholecalciferol, 400 units TABS Take 400 Int'l Units by mouth daily.   Marland Kitchen docusate sodium (COLACE) 100 MG capsule Take 1 capsule (100 mg total) by mouth 2 (two) times daily. (Patient not taking: Reported on 03/09/2018)   . magnesium hydroxide (MILK OF MAGNESIA) 400 MG/5ML suspension Take 30 mLs by mouth daily as needed for mild constipation. (Patient not taking: Reported on 03/09/2018)   . OXYGEN Inhale 2 L into the lungs every 4 (four) hours as needed. For dyspnea Check O2 saturations prior to placing on patient and at least every 4 hours after applying. Notify MD if oxygen saturations drop below baseline while receiving oxygen or no improvement in dyspnea 03/09/2018: Patient does not have oxygen in her home  . UNABLE TO FIND Diet Type:NAS heart healthy    No facility-administered encounter medications on file as of 03/09/2018.     Functional Status:   In your present state of health, do you have any difficulty performing the following activities: 03/09/2018 02/11/2018  Hearing? N N  Vision? N N  Difficulty concentrating or making decisions? N N  Walking or climbing stairs? Y Y  Comment related to recent hip surgery -  Dressing or bathing? N N  Doing errands, shopping? Y Y  Comment related to recent hip surgery -  Preparing Food and eating ? N -  Using the  Toilet? N -  In the past six months, have you accidently leaked urine? Y -  Do you have problems with loss of bowel control? N -  Managing your Medications? N -  Managing your Finances? N -  Housekeeping or managing your Housekeeping? N -  Some recent data might be hidden    Fall/Depression Screening:    Fall Risk  03/09/2018 01/19/2018  Falls in the past year? Yes No  Number falls in past yr: 1 -  Injury with Fall? Yes -  Risk Factor Category  High Fall Risk -  Risk for fall due to : History of fall(s);Impaired mobility -  Follow up Falls evaluation completed;Education provided;Falls prevention discussed -   PHQ 2/9 Scores 03/09/2018 01/19/2018 01/11/2018  PHQ - 2 Score 0 0 0  Exception Documentation Patient refusal - -    Assessment: Pleasant and well groomed. Observed ambulating  using Rolling walker. Steady gait noted. Daughter present for home visit and attentive to patients needs.  Home is very well kept. Clear pathways for ambulation. There is carpeting in the home as well as vinyl. No clutter in the home that would increase fall risk. Patient does have two medium sized dogs in the home. Furniture is spacious and has been rearranged to allow patient larger space for PT exercise and ambulation. There is a Estate agent in patients bedroom that has been nailed to the floor. Patient sleeps in twin bed that is on a very high poster bed frame. Patient is able to get in and out of bed without much difficulty.    SNF discharge packet in home and reviewed by RN including [place] referral and medication list. Patient presented RN CM original Advanced Directive packet that has been signed and notarized.   Plan: TOC follow up call next week. Will send barrier letter and initial home visit documentation to PCP. Send patient [place] "Life Alert" resources.  THN CM Care Plan Problem One     Most Recent Value  Care Plan Problem One  High Risk for Falls  Role Documenting the Problem One  Care Management  Coordinator  Care Plan for Problem One  Active  THN Long Term Goal   Patient will remain free from falls over the next 31 days  THN Long Term Goal Start Date  03/09/18  Interventions for Problem One Long Term Goal  discussed fall risk and precautions with patient and daughter, assessed safety of home environment, assessed for need of assistive equiptment, medication rec completed, discussed side effects of patient medications and valium  THN CM Short Term Goal #1   Patient will review fall EMMI education within the next 14 days  THN CM Short Term Goal #1 Start Date  03/09/18  Interventions for Short Term Goal #1  discussed assignment EMMI education with patient  THN CM Short Term Goal #2   patient will call and establish care with [facility] provider as discussed on discharge instructions within the next 7 days  THN CM Short Term Goal #2 Start Date  03/09/18  Interventions for Short Term Goal #2  reviewed discharge instructions with patient including copy of [facility] referral, disucssed importance with telephone follow-up if she has not heard from [place] by 03/10/18     Kylle Lall E. Rollene Rotunda RN, BSN [facility] Care Management Coordinator [phone]

## 2018-03-10 DIAGNOSIS — K219 Gastro-esophageal reflux disease without esophagitis: Secondary | ICD-10-CM | POA: Insufficient documentation

## 2018-03-10 DIAGNOSIS — H04123 Dry eye syndrome of bilateral lacrimal glands: Secondary | ICD-10-CM | POA: Insufficient documentation

## 2018-03-11 DIAGNOSIS — I517 Cardiomegaly: Secondary | ICD-10-CM | POA: Diagnosis not present

## 2018-03-11 DIAGNOSIS — S75001S Unspecified injury of femoral artery, right leg, sequela: Secondary | ICD-10-CM | POA: Diagnosis not present

## 2018-03-11 DIAGNOSIS — M47816 Spondylosis without myelopathy or radiculopathy, lumbar region: Secondary | ICD-10-CM | POA: Diagnosis not present

## 2018-03-11 DIAGNOSIS — H189 Unspecified disorder of cornea: Secondary | ICD-10-CM | POA: Diagnosis not present

## 2018-03-15 ENCOUNTER — Other Ambulatory Visit: Payer: Self-pay | Admitting: *Deleted

## 2018-03-15 ENCOUNTER — Other Ambulatory Visit: Payer: Self-pay | Admitting: Pharmacist

## 2018-03-15 NOTE — Patient Outreach (Addendum)
[facility]) Care Management  03/15/2018  GEARLDEAN LOMANTO [birth year] [id number]   Phone call to patient to follow up on any community resource needs since her discharge from the SNF. Per patient she is doing well verbalizing no community resources needs that this time. Per patient, her daughters are very supportive and have been helpful in transporting her to medical appointments and staying the night with her to provide care. Per patient, she has now has rails in her bed to avoid falls. Per patient, she feels as if she is getting stronger and is able to prepare light meals for herself when her children are not there. Patient describes additional support available through her neighbor and son in law in the event that her children are not available. Per patient, PT has not started. The nurse from [place] did come to evaluate patient last Thursday, however PT hs not started. Per patient, she was told that they have to have authorization through her insurance company before the service can start. Patient requested an update on the medication assistance requested. Phone call made to Pharmacist [name] who will follow up with her regarding her pharmacy needs.  Plan: This Education officer, museum will sign off at this time. Patient provided with this social worker's contact information if there are any community resource questions or concerns in the future.   Sheralyn Boatman [facility] Care Management [phone]

## 2018-03-15 NOTE — Patient Outreach (Signed)
[facility]) Care Management  03/15/2018  Teresa Chambers 11-22-1935 [id number]   82 y.o. year old female referred to [place] for Medication Assistance (Advair, Proventil) Referred by Quinn Plowman, [facility] telephonic RNCM and HTA concierge for patient assistance for Advair in July 2019.   Patient was recently hospitalized at [facility] 02/11/18 to 02/13/2018 for a broken hip following a fall out of bed. Hip surgery performed, patient discharged to [facility] for rehabilitation, discharged home 03/06/2018.   Patient with [employer] Medicare Advantage plan.   Patient confirms identity with HIPAA-identifiers x2 and gives verbal consent to speak over the phone about medications.    Subjective:   Medication Adherence: Patient reports 100% adherence. Has been taken off enalapril at hospital DC due to hypotension.    Medication Assistance:  [facility] clinical pharmacy team received patient portion of MAPs on 02/17/2018. We went ahead and completed paperwork at that time, expecting patient to be discharged home followign 21 day SNF stay (see note 02/17/2018 for detail). Added myself and Etter Sjogren, [facility] CPhT, back to the care team for appropriate follow up.    Medication Management:  Patient is post hip fracture, chronic inhaled steroid use, white female with history of osteoporosis. No DEXA on file prior to hip fracture 01/2018. Recommend bisphosphonate to Dr. Lavera Guise.   Assessment/Plan: 1. Medication Assistance: Follow up as facilitated by Etter Sjogren. Applications submitted 1/60/7371 2. Medication Management: In light of patient's hip fracture, history of decreased bone mineral density, and several risk factors, recommend bisphosphonate therapy, as well as calcium and Vitamin D supplementation to Dr. Lavera Guise.   [name], PharmD Clinical Pharmacist, [place] [facility] [phone]

## 2018-03-17 ENCOUNTER — Other Ambulatory Visit: Payer: Self-pay

## 2018-03-17 DIAGNOSIS — J449 Chronic obstructive pulmonary disease, unspecified: Secondary | ICD-10-CM | POA: Diagnosis not present

## 2018-03-17 DIAGNOSIS — M4696 Unspecified inflammatory spondylopathy, lumbar region: Secondary | ICD-10-CM | POA: Diagnosis not present

## 2018-03-17 DIAGNOSIS — Z96641 Presence of right artificial hip joint: Secondary | ICD-10-CM | POA: Diagnosis not present

## 2018-03-17 DIAGNOSIS — M1991 Primary osteoarthritis, unspecified site: Secondary | ICD-10-CM | POA: Diagnosis not present

## 2018-03-17 DIAGNOSIS — I110 Hypertensive heart disease with heart failure: Secondary | ICD-10-CM | POA: Diagnosis not present

## 2018-03-17 DIAGNOSIS — I509 Heart failure, unspecified: Secondary | ICD-10-CM | POA: Diagnosis not present

## 2018-03-17 DIAGNOSIS — M80051D Age-related osteoporosis with current pathological fracture, right femur, subsequent encounter for fracture with routine healing: Secondary | ICD-10-CM | POA: Diagnosis not present

## 2018-03-17 DIAGNOSIS — Z9181 History of falling: Secondary | ICD-10-CM | POA: Diagnosis not present

## 2018-03-17 NOTE — Patient Outreach (Addendum)
[facility]) Care Management  03/17/2018  Teresa Chambers 09-Jul-1936 [id number]  TOC Week # 2 call Successful telephone encounter to the above patient after she returned RN CM message previously left with son. Patient states she was participating in a follow up home visit by an RN with [facility]. Patient states [facility] RN has made two visits however she has not received in home therapy from PT. Patient states "my records were lost in their new computer system". Patient is confident that the problem has been resolved and assured by [facility] RN that PT would be call to schedule therapy appointment. Patient states daughter [name] also called and confirmed that patient's records and order for [name] PT have been located and patient is on the "list for scheduling".  Patient denies any falls since last communication. States hip is healing however it is "getting stiff". States she is able to prepare small meals, bathe independently with walker, and perform small household chores. Son is currently available 24/7.   Patient completed her post hospitalization/SNF PCP follow-up as scheduled. Denies any changes in medications. States "my blood pressure was 90 in the office". Patient has now obtained a BP monitor and is checking/recording BP daily. Today's reading 110/62.  Patient acknowledges receipt of [place] life alert information as requested. Will continue to review with family for best options.  Plan: TOC call in 1 week  THN CM Care Plan Problem One     Most Recent Value  Care Plan Problem One  High Risk for Falls  Role Documenting the Problem One  Care Management [place] for Problem One  Active  THN Long Term Goal   Patient will remain free from falls over the next 31 days  THN Long Term Goal Start Date  03/09/18  [facility] CM Short Term Goal #1   Patient will review fall EMMI education within the next 14 days  THN CM Short Term Goal #1 Start Date  03/09/18  [facility] CM Short Term Goal  #1 Met Date  03/17/18  [facility] CM Short Term Goal #2   patient will call and establish care with [facility] provider as discussed on discharge instructions within the next 7 days  THN CM Short Term Goal #2 Start Date  03/09/18  [facility] CM Short Term Goal #2 Met Date  03/17/18      Kendi Defalco E. Rollene Rotunda RN, BSN [facility] Care Management Coordinator [phone]

## 2018-03-18 ENCOUNTER — Other Ambulatory Visit: Payer: Self-pay | Admitting: Pharmacy Technician

## 2018-03-18 ENCOUNTER — Other Ambulatory Visit: Payer: Self-pay | Admitting: Pharmacist

## 2018-03-18 NOTE — Patient Outreach (Signed)
[facility]) Care Management  03/18/2018  Teresa Chambers 1936/05/15 [id number]   Follow up call to [place] to check status of patient appliction for Ventolin HFA and Advair. [place] confirmed that patient has been approved as of 03/17/18 until 07/20/18. Patient should received medication in 7-10 business days.  Will follow up with patient in 10-14 business days to confirm medication has been received.  Maud Deed [place], [place] Management [phone]

## 2018-03-18 NOTE — Patient Outreach (Signed)
[facility]) Care Management  03/18/2018  Teresa Chambers Aug 17, 1935 [id number]  82 y.o. year old female referred to [place] for Medication Assistance (Advair, Proventil) Referred by Quinn Plowman, [name] telephonic RNCM and HTA concierge for patient assistance for Advair in July 2019.   Patient was recently hospitalized at [facility] 02/11/18 to 02/13/2018 for a broken hip following a fall out of bed. Hip surgery performed, patient discharged to [facility] for rehabilitation, discharged home 03/06/2018.   Patient with [employer] Medicare Advantage plan.   Patient confirms identity with HIPAA-identifiers x2 and gives verbal consent to speak over the phone about medications.   Medication Assistance - Patient is has been approved for Ventolin HFA and Advair medications made by [place] and prescribed by Dr. Lavera Guise. Approved 02/14/2018 and expires 07/20/2018.   Care Coordination - Patient requested I reach out to [place] because she has not heard from [facility] regarding PT scheduling. I spoke with Truddie Crumble who will reach out to [facility] today.   Follow up: Patient should receive medications via mail in 7-10 business days. [facility]N CPhT will follow up with patient in 10-14 business days to confirm patient has received medications and to explain any applicable refill process. We will close the pharmacy episode at that time.   [name], PharmD Clinical Pharmacist, [place] [facility] [phone]

## 2018-03-19 ENCOUNTER — Ambulatory Visit: Payer: Self-pay | Admitting: Pharmacist

## 2018-03-19 ENCOUNTER — Other Ambulatory Visit: Payer: Self-pay

## 2018-03-19 NOTE — Patient Outreach (Signed)
[facility]) Care Management  03/19/2018  Teresa Chambers Aug 11, 1935 [id number]  Care Coordination: Successful telephone outreach to the above patient to check status of [facility] PT. Patient states she has yet to receive call to schedule first session. States she has receieved a call from OT and first visit scheduled for today at 12 noon. RN CM called [facility] on patients behalf. Spoke with [name], RN Midwife. [name] states patient is on "Weekend Board" and should receive a call the evening prior to schedule time. RN CM updated patient and verified patient has contact number for [facility] services.  Plan: Follow up TOC call in 1 week as previously planned  Avriel Kandel E. Rollene Rotunda RN, BSN [facility] Care Management Coordinator [phone]

## 2018-03-24 ENCOUNTER — Other Ambulatory Visit: Payer: Self-pay

## 2018-03-24 NOTE — Patient Outreach (Signed)
[place] [facility]) Care Management  03/24/2018  URIYAH Chambers October 15, 1935 [id number]  TOC Week # 3 call Successful telephone encounter to the above patient for week 3 transition of care contact post recent hospitalization and SNF discharge for R Hip Fx with hemiarthroplasty. Patient states she continues to do well. Denies falls or changes in condition since last encounter. Fall precautions reinforced. Right hip incision site healed per patient. States she is able to be "up and about" more however she still remains homebound. Today is the second day she has been independent, performing her own ADLs and staying alone. Her children had "gone back home".  She has a neighbor who is available if needed. She continues to take medications as prescribed.  Patient confirms PT initial visit this past Saturday with a return visit on Monday. States PT and OT have scheduled visits for tomorrow. Patient is looking forward to PT visit related to physical therapist plans to work with her using a cane and manipulating steps in and out of her home. Patient states she received a call from RN that he would be unable to make his scheduled appointment for today. Patient is frustrated at the lack of communication between herself and the [facility] agency, [facility]. Patient states she received a call earlier today from [place] stating "I have been approved for all [name] services". Patient states she informed HTA services had already began. Patient states HTA will not cover services prior to today related to prior services had not been authorized. Patient requested RN CM contact [place] agency to see if this information was correct.  RN CM spoke with [name], Therapist, sports at [place]. [name] states information given by HTA to patient is correct. Patient will not be liable for any payment for services rendered prior to authorization approval. [name] will submit "urgent authorization request" and ask RN CM to call back in the am to  see if patients PT and OT services for tomorrow will be put on hold until authorization is received. RN CM will follow up with [place] at that time and contact patient with all information received.  Plan: Will call patient tomorrow to discuss information obtained from [facility] regarding authorization for services. TOC follow-up phone call next week.   THN CM Care Plan Problem One     Most Recent Value  Care Plan Problem One  High Risk for Falls  Role Documenting the Problem One  Care Management Coordinator  Care Plan for Problem One  Active  THN Long Term Goal   Patient will remain free from falls over the next 31 days  THN Long Term Goal Start Date  03/09/18  Interventions for Problem One Long Term Goal  discussed fall precautions, collaborated with [name] agency to close gaps of care related to inconsistancy of [place] appointments  THN CM Short Term Goal #1   Patient will review fall EMMI education within the next 14 days  THN CM Short Term Goal #1 Start Date  03/09/18  [name] CM Short Term Goal #1 Met Date  03/17/18  [facility] CM Short Term Goal #2   patient will call and establish care with [facility] provider as discussed on discharge instructions within the next 7 days  THN CM Short Term Goal #2 Start Date  03/09/18  [facility] CM Short Term Goal #2 Met Date  03/17/18      Aryan Sparks E. Rollene Rotunda RN, BSN [facility] Care Management Coordinator [phone]

## 2018-03-25 ENCOUNTER — Other Ambulatory Visit: Payer: Self-pay

## 2018-03-25 NOTE — Patient Outreach (Signed)
[facility]) Care Management  03/25/2018  [name] 25-May-1936 [id number]  Care Coordination: Successful telephone outreach to the above patient to discuss information regarding preauthorization of [facility] services. RN CM spoke with [name] this am who stated she has received preauthorizations from HTA for [facility] services including PT/OT/RN. Patient should not have a disruption in services scheduled for today. RN CM relayed information to patient provided by [name]. Patient also informed she would not be financially responsible for any visits that occurred prior to authorization. Patient grateful for RN CM help. Will follow up with patient next week.  Darlys Buis E. Rollene Rotunda RN, BSN [facility] Care Management Coordinator [phone]

## 2018-03-26 ENCOUNTER — Other Ambulatory Visit: Payer: Self-pay | Admitting: Adult Health

## 2018-03-26 ENCOUNTER — Other Ambulatory Visit: Payer: Self-pay | Admitting: Pharmacist

## 2018-03-26 NOTE — Patient Outreach (Signed)
[place] [facility]) Care Management  03/26/2018  SATORIA DUNLOP 05-Jan-1936 [id number]    Medication Assistance: Patient is has been approved for Advair and Ventolin made by [facility] and prescribed by Dr. Lavera Guise. Approved 03/17/2018 and expires 07/20/2018.   Patient received medications 03/25/2018. I will close out the [facility] clinical pharmacy episode at this time and send Dr. Lavera Guise a discipline closure letter.   [name], PharmD Clinical Pharmacist, [place] [facility] [phone]

## 2018-03-29 DIAGNOSIS — I110 Hypertensive heart disease with heart failure: Secondary | ICD-10-CM | POA: Diagnosis not present

## 2018-03-29 DIAGNOSIS — M4696 Unspecified inflammatory spondylopathy, lumbar region: Secondary | ICD-10-CM | POA: Diagnosis not present

## 2018-03-29 DIAGNOSIS — M80051D Age-related osteoporosis with current pathological fracture, right femur, subsequent encounter for fracture with routine healing: Secondary | ICD-10-CM | POA: Diagnosis not present

## 2018-03-29 DIAGNOSIS — Z96641 Presence of right artificial hip joint: Secondary | ICD-10-CM | POA: Diagnosis not present

## 2018-03-29 DIAGNOSIS — J449 Chronic obstructive pulmonary disease, unspecified: Secondary | ICD-10-CM | POA: Diagnosis not present

## 2018-03-29 DIAGNOSIS — Z9181 History of falling: Secondary | ICD-10-CM | POA: Diagnosis not present

## 2018-03-29 DIAGNOSIS — I509 Heart failure, unspecified: Secondary | ICD-10-CM | POA: Diagnosis not present

## 2018-03-29 DIAGNOSIS — M1991 Primary osteoarthritis, unspecified site: Secondary | ICD-10-CM | POA: Diagnosis not present

## 2018-03-31 ENCOUNTER — Other Ambulatory Visit: Payer: Self-pay | Admitting: Pharmacist

## 2018-03-31 DIAGNOSIS — M7061 Trochanteric bursitis, right hip: Secondary | ICD-10-CM | POA: Diagnosis not present

## 2018-03-31 NOTE — Patient Outreach (Signed)
[facility]) Care Management  03/31/2018  Teresa Chambers June 18, 1936 [id number]  82 y.o. year old female contacted [place] services today with questions regarding two of her medications. I previously worked with patient in July and August 2019 getting medication assistance for her inhalers.   Patient verified HIPAA x2.  [employer] medicare advantage plan.   Medication Management:  #Diazepam:  Patient states it is refill too soon but she is out of tablets and in a lot of pain. States she thinks the prescription maybe was written for fewer tablets upon her discharge from the nursing facility? Assessment/Plan:  Placed call to [name] at [facility]. [name] ran a test claim on diazepam and the claim went through. [facility] in [place] asking them to run the script again. Spoke with [name], technician, who stated that the issue was that the prescription had no more refills. A refill request has been sent. Called Dr. [name] office to ask to send in a new diazepam script to [facility] ASAP for patient. Left message on Dr. [name] CMA voicemail. Awaiting call back.   #Methocarbomol:  Patient states it is refill to soon but she is confused by this because she hasn't gotten it filled. Is it an interaction with her other medications? Assessment/Plan: Spoke with [name] at [facility]. Methocarbomol is Tier 4 on [facility] formulary and requires a PA. Baclofen 5,10, or 20mg   and tizanidine 2 and 4mg  are Tier 1 on [facility] formulary. Placed call to [name] at [facility]. Left message for [name], CMA with above information. Awaiting call back.    Follow up in 1-2 days.  [name], PharmD Clinical Pharmacist, [facility] [phone]

## 2018-04-01 ENCOUNTER — Other Ambulatory Visit: Payer: Self-pay

## 2018-04-01 NOTE — Patient Outreach (Signed)
[facility]) Care Management  04/01/2018  Teresa Chambers 09-26-1935 [id number]  TOC Week # 4 call Successful telephone encounter to the above patient for week 4 transition of care contact post recent hospitalization and SNF discharge for R Hip Fx with hemiarthroplasty. Patient states she continues to do well however she has recently developed bursitis in her right hip after "doing a lot of walking a couple of days ago". Patient acknowledges a follow-up visit with orthopedic surgeon yesterday. Note reviewed in Epic. Patient received injection in hip. Denies relief as of date. Hopeful that she will see benefits of injection over the next several days. Patient denies falls since last encounter. Fall precautions reinforced related to new prescription for muscle relaxant and increased pain in hip.  Patient confirms PT visit from [facility] on Monday. Unsure when/if they will return. States [facility] RN had scheduled visit for Tuesday but never showed. Patient states Ortho would like for PT to continue and she is to let MD know how many visit she has left. RN CM encouraged patient to call [name] agency ([facility]) for information related to number of remaining visit for PT. Patient agreed to follow up with [facility].  Medication's reviewed. Patient states she is "out of valium". She states she has been taking valium twice daily for years. She was discharged home with a separate PRN prescription for Valium BID. She was taking them as scheduled BID, ran out, attempted to fill and told "it was too soon". RN CM collaborated with [name] as patient states she "is currently working on it". [name] will follow up with pharmacy and patient.  Patient continues to monitor her BP daily. Reports 128/79 this am. Wt 170.6.  Addendum: Patient called RN CM back after speaking with [facility] and reports PT will visit on Friday 9/13 with two remaining visits next week. RN has one last visit  this week.  Plan: TOC call next week  [name] CM Care Plan Problem One     Most Recent Value  Care Plan Problem One  High Risk for Falls  Role Documenting the Problem One  Care Management [place] for Problem One  Active  THN Long Term Goal   Patient will remain free from falls over the next 31 days  THN Long Term Goal Start Date  03/09/18  Interventions for Problem One Long Term Goal  discussed fall prevention related to new diagnosis of bursitis in R hip and new medication side effects  THN CM Short Term Goal #1   Patient will review fall EMMI education within the next 14 days  THN CM Short Term Goal #1 Start Date  03/09/18  [facility] CM Short Term Goal #1 Met Date  03/17/18  [facility] CM Short Term Goal #2   patient will call and establish care with [name] provider as discussed on discharge instructions within the next 7 days  THN CM Short Term Goal #2 Start Date  03/09/18  [facility] CM Short Term Goal #2 Met Date  03/17/18     [name] E. Rollene Rotunda RN, BSN [name] Care Management Coordinator [phone]

## 2018-04-07 ENCOUNTER — Other Ambulatory Visit: Payer: Self-pay

## 2018-04-07 NOTE — Patient Outreach (Signed)
[place] [facility]) Care Management  04/07/2018  Teresa Chambers 1936-04-16 [id number]  TOC Week # 5 call Successful telephone encounter to the above patient for week 5 transition of care contact post recent hospitalization and SNF discharge for R Hip Fx with hemiarthroplasty. Patient has a new diagnosis of bursitis in her right hip and is s/p injection a week ago. Patient states injection has not helped. She was instructed to call ortho 3 days post injection if no improvement in symptoms however she has not called. States "I don't know what else they can do". RN CM discussed importance of follow through with MD orders and fall risks as it relates to increased pain with ambulation. Patient agrees to call orthopedist today.   She continues to attempt PT exercise independently. "I walk through the house and then walk backwards through the house but I have pain with every step". "I feel like I am starting over and I have only been approved for 4 more PT sessions without an Appeal. Patient has spoken with [facility]. 4 additional PT session have not started as of date. Patient hopes to be contacted today by [facility] and OT have discharged.  Patient has obtained her BID prescription of valium and has resumed.  Plan: Will close [facility] program and continue to follow patient for Community Case Management and Coordination of Care. Routine home visit next week.    THN CM Care Plan Problem One     Most Recent Value  Care Plan Problem One  High Risk for Falls  Role Documenting the Problem One  Care Management Coordinator  Care Plan for Problem One  Active  THN Long Term Goal   Patient will remain free from falls over the next 31 days  THN Long Term Goal Start Date  03/09/18  Interventions for Problem One Long Term Goal  encouraged patient to contact orthopedic surgeon as directed today, discussed fall precautions related to inability to ambulate without pain and patient admitting digression  in ability to ambulate  THN CM Short Term Goal #1   Patient will review fall EMMI education within the next 14 days  THN CM Short Term Goal #1 Start Date  03/09/18  [facility] CM Short Term Goal #1 Met Date  03/17/18  [facility] CM Short Term Goal #2   patient will call and establish care with [facility] provider as discussed on discharge instructions within the next 7 days  THN CM Short Term Goal #2 Start Date  03/09/18  [facility] CM Short Term Goal #2 Met Date  03/17/18      Lakesia Dahle E. Rollene Rotunda RN, BSN [name] Care Management Coordinator [phone]

## 2018-04-08 DIAGNOSIS — K210 Gastro-esophageal reflux disease with esophagitis: Secondary | ICD-10-CM | POA: Diagnosis not present

## 2018-04-08 DIAGNOSIS — M47816 Spondylosis without myelopathy or radiculopathy, lumbar region: Secondary | ICD-10-CM | POA: Diagnosis not present

## 2018-04-08 DIAGNOSIS — Z0000 Encounter for general adult medical examination without abnormal findings: Secondary | ICD-10-CM | POA: Diagnosis not present

## 2018-04-08 DIAGNOSIS — I517 Cardiomegaly: Secondary | ICD-10-CM | POA: Diagnosis not present

## 2018-04-08 DIAGNOSIS — H189 Unspecified disorder of cornea: Secondary | ICD-10-CM | POA: Diagnosis not present

## 2018-04-09 DIAGNOSIS — I110 Hypertensive heart disease with heart failure: Secondary | ICD-10-CM | POA: Diagnosis not present

## 2018-04-09 DIAGNOSIS — I509 Heart failure, unspecified: Secondary | ICD-10-CM | POA: Diagnosis not present

## 2018-04-09 DIAGNOSIS — M80051D Age-related osteoporosis with current pathological fracture, right femur, subsequent encounter for fracture with routine healing: Secondary | ICD-10-CM | POA: Diagnosis not present

## 2018-04-09 DIAGNOSIS — M4696 Unspecified inflammatory spondylopathy, lumbar region: Secondary | ICD-10-CM | POA: Diagnosis not present

## 2018-04-09 DIAGNOSIS — Z9181 History of falling: Secondary | ICD-10-CM | POA: Diagnosis not present

## 2018-04-09 DIAGNOSIS — J449 Chronic obstructive pulmonary disease, unspecified: Secondary | ICD-10-CM | POA: Diagnosis not present

## 2018-04-09 DIAGNOSIS — M1991 Primary osteoarthritis, unspecified site: Secondary | ICD-10-CM | POA: Diagnosis not present

## 2018-04-09 DIAGNOSIS — Z96641 Presence of right artificial hip joint: Secondary | ICD-10-CM | POA: Diagnosis not present

## 2018-04-12 DIAGNOSIS — M80051D Age-related osteoporosis with current pathological fracture, right femur, subsequent encounter for fracture with routine healing: Secondary | ICD-10-CM | POA: Diagnosis not present

## 2018-04-12 DIAGNOSIS — I509 Heart failure, unspecified: Secondary | ICD-10-CM | POA: Diagnosis not present

## 2018-04-12 DIAGNOSIS — J449 Chronic obstructive pulmonary disease, unspecified: Secondary | ICD-10-CM | POA: Diagnosis not present

## 2018-04-12 DIAGNOSIS — M1991 Primary osteoarthritis, unspecified site: Secondary | ICD-10-CM | POA: Diagnosis not present

## 2018-04-12 DIAGNOSIS — Z9181 History of falling: Secondary | ICD-10-CM | POA: Diagnosis not present

## 2018-04-12 DIAGNOSIS — Z96641 Presence of right artificial hip joint: Secondary | ICD-10-CM | POA: Diagnosis not present

## 2018-04-12 DIAGNOSIS — M4696 Unspecified inflammatory spondylopathy, lumbar region: Secondary | ICD-10-CM | POA: Diagnosis not present

## 2018-04-12 DIAGNOSIS — I110 Hypertensive heart disease with heart failure: Secondary | ICD-10-CM | POA: Diagnosis not present

## 2018-04-13 ENCOUNTER — Other Ambulatory Visit: Payer: Self-pay

## 2018-04-13 NOTE — Patient Outreach (Signed)
[facility]) Care Management   04/13/2018  Teresa Chambers Dec 16, 1935 [id number]  Teresa Chambers is an 82 y.o. female  Subjective: Patient states she is doing much better. "Look at me, I am walking". Patient voiced seeing PCP for scheduled follow-up appointment 9/19.  Per patient discussed pain in R hip and intermittent heart palpitations. Patient states PCP "gave me a pain injection, medicine for my heart palpitation, and medicine for my hip inflammation". Patient acknowledges significant pain relief after intervention. Patient states PT is still involved. She received in-home therapy on Monday and PT will return tomorrow. Patient states she has been released to drive after October 8th if her "coordination" has improved. Her follow-up appointment with orthopedic surgeon is October 25 and she anticipates being released from his care.PT will work with her on ambulation on unsteady ground during next appointment. Patient states she is still "a little unsteady at times". Patient denies need for additional community services at this time. She continues to be able to maintain her home, complete ADLs and her neighbor helps with IADLs.  Objective:   BP 118/62 (BP Location: Right Arm, Patient Position: Sitting, Cuff Size: Normal)   Pulse 89   Resp 17   Wt 166 lb 6.4 oz (75.5 kg) Comment: reported morning weight  SpO2 96%   BMI 24.57 kg/m   Physical Exam  Constitutional: She is oriented to person, place, and time. She appears well-developed and well-nourished.  Cardiovascular: Normal rate, regular rhythm, normal heart sounds and intact distal pulses.  Respiratory: Effort normal and breath sounds normal. She has no wheezes.  GI: Soft. Bowel sounds are normal.  Musculoskeletal:  Decreased ROM R hip  Neurological: She is alert and oriented to person, place, and time.  Skin: Skin is warm and dry.  Psychiatric: She has a normal mood and affect. Her behavior is normal. Judgment and  thought content normal.    Encounter Medications:   Outpatient Encounter Medications as of 04/13/2018  Medication Sig Note  . diltiazem (DILACOR XR) 120 MG 24 hr capsule Take 120 mg by mouth daily.   . meloxicam (MOBIC) 7.5 MG tablet Take 7.5 mg by mouth daily.   Marland Kitchen acetaminophen (TYLENOL) 325 MG tablet Take 1-2 tablets (325-650 mg total) by mouth every 6 (six) hours as needed for mild pain (pain score 1-3 or temp > 100.5).   Marland Kitchen albuterol (PROVENTIL HFA;VENTOLIN HFA) 108 (90 Base) MCG/ACT inhaler Inhale 2 puffs into the lungs every 6 (six) hours as needed for wheezing or shortness of breath.   Marland Kitchen albuterol (PROVENTIL) (2.5 MG/3ML) 0.083% nebulizer solution Inhale 3 mLs (2.5 mg total) into the lungs 4 (four) times daily.   . Ascorbic Acid (VITAMIN C WITH ROSE HIPS) 1000 MG tablet Take 1,000 mg by mouth daily.   . baclofen (LIORESAL) 10 MG tablet Take 10 mg by mouth 2 (two) times daily.   . diazepam (VALIUM) 2 MG tablet Take 1 tablet (2 mg total) by mouth 2 (two) times daily as needed for muscle spasms. 04/01/2018: Taking 2 x daily scheduled  . docusate sodium (COLACE) 100 MG capsule Take 1 capsule (100 mg total) by mouth 2 (two) times daily. (Patient not taking: Reported on 03/09/2018)   . DULoxetine (CYMBALTA) 60 MG capsule Take 1 capsule (60 mg total) by mouth 2 (two) times daily.   . Ferrous Sulfate (SLOW FE) 142 (45 Fe) MG TBCR Take 45 mg by mouth daily.   . fluticasone (FLONASE) 50 MCG/ACT nasal spray Place 2  sprays into both nostrils daily.   . Fluticasone-Salmeterol (ADVAIR) 250-50 MCG/DOSE AEPB Inhale 1 puff into the lungs 2 (two) times daily. RINSE MOUTH AFTER USE   . guaiFENesin-dextromethorphan (ROBITUSSIN DM) 100-10 MG/5ML syrup Take 5 mLs by mouth every 6 (six) hours as needed for cough.   Marland Kitchen Lifitegrast (XIIDRA) 5 % SOLN Place 1 drop into both eyes 2 (two) times daily. for dry eyes  RESIDENT SUPPLY FROM HOME BOTTOM DRAWER OF MED CART   . magnesium hydroxide (MILK OF MAGNESIA) 400 MG/5ML  suspension Take 30 mLs by mouth daily as needed for mild constipation. (Patient not taking: Reported on 03/09/2018)   . montelukast (SINGULAIR) 10 MG tablet Take 1 tablet (10 mg total) by mouth daily.   Marland Kitchen omeprazole (PRILOSEC) 40 MG capsule Take 1 capsule (40 mg total) by mouth 2 (two) times daily. (Patient taking differently: Take 40 mg by mouth daily. )   . ondansetron (ZOFRAN) 4 MG tablet Take 1 tablet (4 mg total) by mouth every 6 (six) hours as needed for nausea.   Marland Kitchen oxyCODONE (OXY IR/ROXICODONE) 5 MG immediate release tablet Take 1-2 tablets (5-10 mg total) by mouth every 4 (four) hours as needed for moderate pain, severe pain or breakthrough pain. (Patient not taking: Reported on 04/07/2018) 03/15/2018: Taking about 1 tablet per day   . OXYGEN Inhale 2 L into the lungs every 4 (four) hours as needed. For dyspnea Check O2 saturations prior to placing on patient and at least every 4 hours after applying. Notify MD if oxygen saturations drop below baseline while receiving oxygen or no improvement in dyspnea 03/09/2018: Patient does not have oxygen in her home  . simvastatin (ZOCOR) 40 MG tablet Take 1 tablet (40 mg total) by mouth daily.   Marland Kitchen UNABLE TO FIND Diet Type:NAS heart healthy   . Vitamin D, Cholecalciferol, 400 units TABS Take 400 Int'l Units by mouth daily.    No facility-administered encounter medications on file as of 04/13/2018.     Functional Status:   In your present state of health, do you have any difficulty performing the following activities: 03/09/2018 02/11/2018  Hearing? N N  Vision? N N  Difficulty concentrating or making decisions? N N  Walking or climbing stairs? Y Y  Comment related to recent hip surgery -  Dressing or bathing? N N  Doing errands, shopping? Y Y  Comment related to recent hip surgery -  Preparing Food and eating ? N -  Using the Toilet? N -  In the past six months, have you accidently leaked urine? Y -  Do you have problems with loss of bowel control? N  -  Managing your Medications? N -  Managing your Finances? N -  Housekeeping or managing your Housekeeping? N -  Some recent data might be hidden    Fall/Depression Screening:    Fall Risk  03/09/2018 01/19/2018  Falls in the past year? Yes No  Number falls in past yr: 1 -  Injury with Fall? Yes -  Risk Factor Category  High Fall Risk -  Risk for fall due to : History of fall(s);Impaired mobility -  Follow up Falls evaluation completed;Education provided;Falls prevention discussed -   PHQ 2/9 Scores 03/09/2018 01/19/2018 01/11/2018  PHQ - 2 Score 0 0 0  Exception Documentation Patient refusal - -    Assessment: Patient met RN CM at door. Pleasant, well groomed and dressed, ambulating unassisted. Slow steady gait noted. Patients 2 indoor dogs underfoot. Home well maintained  and pathways clear. Physical assessment of patient as documented above.  RN CM discussed fall precautions with patient including risk of tripping over dogs. Patient continues to admit to not feeling steady on her feet. Also discussed and provided handouts on CHF and COPD action plans for patient to read and review for discussion at next encounter.  Plan: Follow up visit in 1 month  [facility] CM Care Plan Problem One     Most Recent Value  Care Plan Problem One  High Risk for Falls  Role Documenting the Problem One  Care Management [place] for Problem One  Active  THN Long Term Goal   Patient will remain free from falls over the next 31 days  THN Long Term Goal Start Date  04/13/18 [facility] reset as patient still remains at risk for falling]  Interventions for Problem One Long Term Goal  encouraged continued participation with PT and independent exercise at home, gave patient brochure on [facility] "life alert", discussed fall prevention as it relates to patient tripping over her two dogs, discussed use of safety equiptment ie rolator and cane with ambulation  THN CM Short Term Goal #1   Patient will review fall EMMI  education within the next 14 days  THN CM Short Term Goal #1 Start Date  03/09/18  [facility] CM Short Term Goal #1 Met Date  03/17/18  [facility] CM Short Term Goal #2   patient will call and establish care with [name] provider as discussed on discharge instructions within the next 7 days  THN CM Short Term Goal #2 Start Date  03/09/18  [name] CM Short Term Goal #2 Met Date  03/17/18     Teresa Chambers E. Rollene Rotunda RN, BSN [name] Care Management Coordinator [phone]

## 2018-04-19 DIAGNOSIS — I110 Hypertensive heart disease with heart failure: Secondary | ICD-10-CM | POA: Diagnosis not present

## 2018-04-19 DIAGNOSIS — Z9181 History of falling: Secondary | ICD-10-CM | POA: Diagnosis not present

## 2018-04-19 DIAGNOSIS — M4696 Unspecified inflammatory spondylopathy, lumbar region: Secondary | ICD-10-CM | POA: Diagnosis not present

## 2018-04-19 DIAGNOSIS — J449 Chronic obstructive pulmonary disease, unspecified: Secondary | ICD-10-CM | POA: Diagnosis not present

## 2018-04-19 DIAGNOSIS — M1991 Primary osteoarthritis, unspecified site: Secondary | ICD-10-CM | POA: Diagnosis not present

## 2018-04-19 DIAGNOSIS — Z96641 Presence of right artificial hip joint: Secondary | ICD-10-CM | POA: Diagnosis not present

## 2018-04-19 DIAGNOSIS — M80051D Age-related osteoporosis with current pathological fracture, right femur, subsequent encounter for fracture with routine healing: Secondary | ICD-10-CM | POA: Diagnosis not present

## 2018-04-19 DIAGNOSIS — I509 Heart failure, unspecified: Secondary | ICD-10-CM | POA: Diagnosis not present

## 2018-04-21 DIAGNOSIS — E2839 Other primary ovarian failure: Secondary | ICD-10-CM | POA: Diagnosis not present

## 2018-04-21 DIAGNOSIS — S72001S Fracture of unspecified part of neck of right femur, sequela: Secondary | ICD-10-CM | POA: Diagnosis not present

## 2018-04-21 DIAGNOSIS — Z780 Asymptomatic menopausal state: Secondary | ICD-10-CM | POA: Diagnosis not present

## 2018-05-10 DIAGNOSIS — J4520 Mild intermittent asthma, uncomplicated: Secondary | ICD-10-CM | POA: Diagnosis not present

## 2018-05-10 DIAGNOSIS — S72001S Fracture of unspecified part of neck of right femur, sequela: Secondary | ICD-10-CM | POA: Diagnosis not present

## 2018-05-10 DIAGNOSIS — I471 Supraventricular tachycardia: Secondary | ICD-10-CM | POA: Diagnosis not present

## 2018-05-10 DIAGNOSIS — E669 Obesity, unspecified: Secondary | ICD-10-CM | POA: Diagnosis not present

## 2018-05-14 ENCOUNTER — Other Ambulatory Visit: Payer: Self-pay

## 2018-05-14 DIAGNOSIS — S51802A Unspecified open wound of left forearm, initial encounter: Secondary | ICD-10-CM | POA: Diagnosis not present

## 2018-05-14 NOTE — Patient Outreach (Signed)
[place] [facility]) Care Management  05/14/2018  Teresa Chambers 02-Sep-1935 [id number]  Care Coordination:  Unsuccessful unscheduled telephone call to Teresa Chambers to follow up on her surgical visit today. HIPAA compliant VM message left requesting call back before 5. RN CM has planned final telephone assessment visit scheduled for Monday 05/17/18 and will follow up with patient at that time if she does not return call today.  Teresa Chambers E. Rollene Rotunda RN, BSN [facility] Care Management Coordinator [phone]

## 2018-05-17 ENCOUNTER — Other Ambulatory Visit: Payer: Self-pay

## 2018-05-17 DIAGNOSIS — S51812A Laceration without foreign body of left forearm, initial encounter: Secondary | ICD-10-CM | POA: Diagnosis not present

## 2018-05-17 NOTE — Patient Outreach (Signed)
[place] [facility]) Care Management  05/17/2018  Teresa Chambers 1935-07-29 [id number]  Telephone Assessment/Case Closure: Successful telephone encounter to the above patient for routien monthly follow up/telephone assessment. Patient is s/p R Hip Fx with hemiarthroplasty who required SNF stay for PT. Patient was discharged to home from SNF of 03/06/18 with [facility] follow up and chronic case management/care coordination/transition of care from [facility].  Patient states she is doing well. She does complain of a skin tear to her arm that required a visit to urgent care yesterday. She states skin tear occurred by her purse strap "sliding down my arm". She will follow up with her dermatologist today. She had her final follow up with her orthopedic surgeon on Friday 05/14/18. She has been released to return to all normal activities including driving. She does not have to follow up. She has completed all HH treatments.  Patient has reviewed education and action plans for COPD and CHF. She requested additional information and enrollment in the [place] Coach Program.  RN CM reviewed goals established at start of care. Patient has met goals and no other Community Case Management needs identified at this time.  Plan: RN CM will close Complex Case Management program and send referral to Disease Management/Health Coach. PCP will be notified.  THN CM Care Plan Problem One     Most Recent Value  Care Plan Problem One  High Risk for Falls  Role Documenting the Problem One  Care Management Coordinator  Care Plan for Problem One  Not Active  THN Long Term Goal   Patient will remain free from falls over the next 31 days  THN Long Term Goal Start Date  04/13/18 [facility] reset as patient still remains at risk for falling]  THN Long Term Goal Met Date  05/17/18  [facility] CM Short Term Goal #1   Patient will review fall EMMI education within the next 14 days  THN CM Short Term Goal #1 Start  Date  03/09/18  [facility] CM Short Term Goal #1 Met Date  03/17/18  [facility] CM Short Term Goal #2   patient will call and establish care with [facility] provider as discussed on discharge instructions within the next 7 days  THN CM Short Term Goal #2 Start Date  03/09/18  [facility] CM Short Term Goal #2 Met Date  03/17/18    [facility] CM Care Plan Problem Two     Most Recent Value  Care Plan Problem Two  COPD-Self Care  Role Documenting the Problem Two  Care Management Coordinator  Care Plan for Problem Two  Active  Interventions for Problem Two Long Term Goal   referral to [place] Term Goal  Over the next 31 days patient will engage with the [place] Team for ongoing COPD education as evidenced by patient reporting and review of EMR  [name] Long Term Goal Start Date  05/17/18     Teresa Mcknight E. Rollene Rotunda RN, BSN [facility] Care Management Coordinator [phone]

## 2018-05-18 ENCOUNTER — Encounter: Payer: Self-pay | Admitting: *Deleted

## 2018-06-10 ENCOUNTER — Other Ambulatory Visit: Payer: Self-pay | Admitting: *Deleted

## 2018-06-14 NOTE — Patient Outreach (Signed)
[name]) Care Management  06/14/2018   Teresa Chambers [birth year] [id number]  RN Health Coach telephone call to patient.  Hipaa compliance verified. Per patient she is doing good. Patient is in the green zone. Per patient she has not had to use her nebulizer. She used the pro air  Inhaler 2 times for three days. Patient stated she has been having some pain in her hip. She has been using ice on it. RN discussed alternation heat and ice. Patient has taken her flu shot. Per patient she has been using her cane lately due to the pain. Per patient she does not have a medical alert. She has 2 [place] in rooms that can hear her when she calls out. Patient has agreed to follow up outreach calls.   Current Medications:  Current Outpatient Medications  Medication Sig Dispense Refill  . acetaminophen (TYLENOL) 325 MG tablet Take 1-2 tablets (325-650 mg total) by mouth every 6 (six) hours as needed for mild pain (pain score 1-3 or temp > 100.5).    Marland Kitchen albuterol (PROVENTIL HFA;VENTOLIN HFA) 108 (90 Base) MCG/ACT inhaler Inhale 2 puffs into the lungs every 6 (six) hours as needed for wheezing or shortness of breath. 18 g 0  . albuterol (PROVENTIL) (2.5 MG/3ML) 0.083% nebulizer solution Inhale 3 mLs (2.5 mg total) into the lungs 4 (four) times daily. 75 mL 0  . Ascorbic Acid (VITAMIN C WITH ROSE HIPS) 1000 MG tablet Take 1,000 mg by mouth daily.    . diazepam (VALIUM) 2 MG tablet Take 1 tablet (2 mg total) by mouth 2 (two) times daily as needed for muscle spasms. 28 tablet 0  . diltiazem (DILACOR XR) 120 MG 24 hr capsule Take 120 mg by mouth daily.    . DULoxetine (CYMBALTA) 60 MG capsule Take 1 capsule (60 mg total) by mouth 2 (two) times daily. 60 capsule 0  . Ferrous Sulfate (SLOW FE) 142 (45 Fe) MG TBCR Take 45 mg by mouth daily.    . fluticasone (FLONASE) 50 MCG/ACT nasal spray Place 2 sprays into both nostrils daily. 16 g 0  . Fluticasone-Salmeterol (ADVAIR) 250-50 MCG/DOSE AEPB  Inhale 1 puff into the lungs 2 (two) times daily. RINSE MOUTH AFTER USE 60 each 0  . guaiFENesin-dextromethorphan (ROBITUSSIN DM) 100-10 MG/5ML syrup Take 5 mLs by mouth every 6 (six) hours as needed for cough. 118 mL 0  . Lifitegrast (XIIDRA) 5 % SOLN Place 1 drop into both eyes 2 (two) times daily. for dry eyes  RESIDENT SUPPLY FROM HOME BOTTOM DRAWER OF MED CART 10 each 0  . meloxicam (MOBIC) 7.5 MG tablet Take 7.5 mg by mouth daily.    . montelukast (SINGULAIR) 10 MG tablet Take 1 tablet (10 mg total) by mouth daily. 30 tablet 0  . omeprazole (PRILOSEC) 40 MG capsule Take 1 capsule (40 mg total) by mouth 2 (two) times daily. (Patient taking differently: Take 40 mg by mouth daily. ) 60 capsule 0  . simvastatin (ZOCOR) 40 MG tablet Take 1 tablet (40 mg total) by mouth daily. 30 tablet 0  . Vitamin D, Cholecalciferol, 400 units TABS Take 400 Int'l Units by mouth daily.    . baclofen (LIORESAL) 10 MG tablet Take 10 mg by mouth 2 (two) times daily.    Marland Kitchen docusate sodium (COLACE) 100 MG capsule Take 1 capsule (100 mg total) by mouth 2 (two) times daily. (Patient not taking: Reported on 06/14/2018) 10 capsule 0  . magnesium hydroxide (MILK OF  MAGNESIA) 400 MG/5ML suspension Take 30 mLs by mouth daily as needed for mild constipation. (Patient not taking: Reported on 03/09/2018) 360 mL 0  . ondansetron (ZOFRAN) 4 MG tablet Take 1 tablet (4 mg total) by mouth every 6 (six) hours as needed for nausea. (Patient not taking: Reported on 06/14/2018) 30 tablet 0  . oxyCODONE (OXY IR/ROXICODONE) 5 MG immediate release tablet Take 1-2 tablets (5-10 mg total) by mouth every 4 (four) hours as needed for moderate pain, severe pain or breakthrough pain. (Patient not taking: Reported on 04/07/2018) 120 tablet 0  . OXYGEN Inhale 2 L into the lungs every 4 (four) hours as needed. For dyspnea Check O2 saturations prior to placing on patient and at least every 4 hours after applying. Notify MD if oxygen saturations drop below  baseline while receiving oxygen or no improvement in dyspnea    . UNABLE TO FIND Diet Type:NAS heart healthy     No current facility-administered medications for this visit.     Functional Status:  In your present state of health, do you have any difficulty performing the following activities: 06/14/2018 03/09/2018  Hearing? N N  Vision? N N  Difficulty concentrating or making decisions? N N  Walking or climbing stairs? Y Y  Comment - related to recent hip surgery  Dressing or bathing? N N  Doing errands, shopping? Y Y  Comment - related to recent hip surgery  Preparing Food and eating ? N N  Using the Toilet? N N  In the past six months, have you accidently leaked urine? Y Y  Do you have problems with loss of bowel control? N N  Managing your Medications? N N  Managing your Finances? N N  Housekeeping or managing your Housekeeping? N N  Some recent data might be hidden    Fall/Depression Screening: Fall Risk  06/14/2018 03/09/2018 01/19/2018  Falls in the past year? 1 Yes No  Number falls in past yr: 0 1 -  Injury with Fall? 1 Yes -  Risk Factor Category  - High Fall Risk -  Risk for fall due to : History of fall(s);Impaired balance/gait;Impaired mobility History of fall(s);Impaired mobility -  Follow up - Falls evaluation completed;Education provided;Falls prevention discussed -   PHQ 2/9 Scores 06/14/2018 03/09/2018 01/19/2018 01/11/2018  PHQ - 2 Score 0 0 0 0  Exception Documentation - Patient refusal - -    Assessment:  Patient uses inhalers Patient has not had to use the nebulizer Patient received the flu shot  Plan:  RN sent 2020 calendar book RN discussed falls prevention and medical alert RN sent COPD form RN sent educational material on COPD exacerbation RN discussed activity and COPD RN sent educational material on COPD and activity RN sent educational material on  Eating plan for COPD RN will follow up within the month of January  Johny Shock BSN  RN [place] Management [phone]

## 2018-06-22 ENCOUNTER — Other Ambulatory Visit: Payer: Self-pay | Admitting: Adult Health

## 2018-06-22 DIAGNOSIS — K2100 Gastro-esophageal reflux disease with esophagitis, without bleeding: Secondary | ICD-10-CM

## 2018-07-15 ENCOUNTER — Other Ambulatory Visit: Payer: Self-pay

## 2018-07-15 ENCOUNTER — Telehealth: Payer: Self-pay | Admitting: Pharmacist

## 2018-07-15 NOTE — Patient Outreach (Addendum)
[place] [name]) Care Management  07/15/2018  Teresa Chambers 04/11/1936 [id number]    Telephone Screen Referral Date :07/15/2018 Referral Source:HTA Referral Referral Reason:Medication assistance Insurance:HTA  Outreach attempt # 1 To patient for [facility] referral. HIPAA Verified.  The patient states that she has received her last refill in November.  She has a inhaler to last her for a month.  She states that she was told that she will need to spend 600 dollars out of pocket before she can reapply for assistance.  I asked the patient had she asked if her physician had samples.  She stated that they do not have receive samples like they use to.  I informd the patient that I will contact the [place] to see what other options she may have.  The patient verbalized understanding.   Plan: RN Health Coach will send note to [place] coach Johny Shock will contact the patient at the next scheduled interval.   Lazaro Arms RN, BSN, [place] Direct Dial:  [phone] Fax: [phone]

## 2018-07-15 NOTE — Telephone Encounter (Signed)
-----   Message from Lazaro Arms, RN sent at 07/15/2018 12:58 PM EST ----- Teresa Chambers  The patient has Copd.  She was approved previously by Lottie Dawson with the company for [employer] from 02/2018 to 07/20/2018. The patient states that she received her last refill in November and has enough for 1 month. She states that she was told that she will have to pay 600 dollars out of pocket before  she will be able to apply again.  I asked the patient if she asked her physician office and she stated that she had.  Can you please call her to explain her options or how the process works.  Thanks Traci.

## 2018-07-15 NOTE — Patient Outreach (Addendum)
[place] [facility]) Care Management  07/15/2018  ANIKA SHORE 1936-02-16 [id number]   Patient was called per phone call/inbox from [place], Lazaro Arms, RN. HIPAA identifiers were obtained. Patient wondered if there were any other programs she could utilize to continue to get Advair at no cost.   Patient was educated on the way the [employer] works. One of their eligibility requirements is that patients spend at least $600 in out-of-pocket medication expenses for the year. Patient received her last refill of Advair from [place] program in November and said she had one more full inhaler left.  It was explained that since she has Medicare, she would have to reapply for 2020 and meet the $600 out-of-pocket expense requirement.   If the patient would be willing to switch and if her provider agreed, the patient could be switched to [facility].  However, to decrease confusion, the patient should probably just stick with her current meds.  Patient was encouraged to keep her EOB statements from HTA and give our office a call back when she gets close to spending $600.  In addition, patient was reminded a new benefit year starts in 2020. As such, she will no longer be in the donut hole and will not    Patient worked with [place] Lottie Dawson in the past.   Plan: Route note to [place], PharmD, [place] Clinical Pharmacist [phone]

## 2018-07-28 DIAGNOSIS — D1800 Hemangioma unspecified site: Secondary | ICD-10-CM | POA: Diagnosis not present

## 2018-07-28 DIAGNOSIS — L812 Freckles: Secondary | ICD-10-CM | POA: Diagnosis not present

## 2018-07-28 DIAGNOSIS — D692 Other nonthrombocytopenic purpura: Secondary | ICD-10-CM | POA: Diagnosis not present

## 2018-07-28 DIAGNOSIS — L821 Other seborrheic keratosis: Secondary | ICD-10-CM | POA: Diagnosis not present

## 2018-07-28 DIAGNOSIS — D225 Melanocytic nevi of trunk: Secondary | ICD-10-CM | POA: Diagnosis not present

## 2018-07-28 DIAGNOSIS — Z85828 Personal history of other malignant neoplasm of skin: Secondary | ICD-10-CM | POA: Diagnosis not present

## 2018-07-28 DIAGNOSIS — L570 Actinic keratosis: Secondary | ICD-10-CM | POA: Diagnosis not present

## 2018-07-28 DIAGNOSIS — D2230 Melanocytic nevi of unspecified part of face: Secondary | ICD-10-CM | POA: Diagnosis not present

## 2018-07-28 DIAGNOSIS — L820 Inflamed seborrheic keratosis: Secondary | ICD-10-CM | POA: Diagnosis not present

## 2018-07-28 DIAGNOSIS — D229 Melanocytic nevi, unspecified: Secondary | ICD-10-CM | POA: Diagnosis not present

## 2018-07-28 DIAGNOSIS — Z1283 Encounter for screening for malignant neoplasm of skin: Secondary | ICD-10-CM | POA: Diagnosis not present

## 2018-07-28 DIAGNOSIS — L578 Other skin changes due to chronic exposure to nonionizing radiation: Secondary | ICD-10-CM | POA: Diagnosis not present

## 2018-08-05 DIAGNOSIS — J4520 Mild intermittent asthma, uncomplicated: Secondary | ICD-10-CM | POA: Diagnosis not present

## 2018-08-05 DIAGNOSIS — I471 Supraventricular tachycardia: Secondary | ICD-10-CM | POA: Diagnosis not present

## 2018-08-05 DIAGNOSIS — M1990 Unspecified osteoarthritis, unspecified site: Secondary | ICD-10-CM | POA: Diagnosis not present

## 2018-08-05 DIAGNOSIS — E669 Obesity, unspecified: Secondary | ICD-10-CM | POA: Diagnosis not present

## 2018-08-11 ENCOUNTER — Other Ambulatory Visit: Payer: Self-pay | Admitting: *Deleted

## 2018-08-11 NOTE — Patient Outreach (Signed)
[place] [facility]) Care Management  08/11/2018   Teresa Chambers 1935-09-13 [id number]  RN Health Coach telephone call to patient.  Hipaa compliance verified.  Per patient she is doing good. Patient stated that she has a non productive cough at night. Per patient she noted that taking cough medicine helps her better than using the rescue inhalers. Per patient she has to use the rescue inhaler about 3 times a week. Patient stated that her feet and legs are swollen. She have been referred to a vascular Dr. Patient stated she does not have any pain in her calf when she flexes her foot. Patient is wearing support hose.  Patient has agreed to follow up outreach calls.   Current Medications:  Current Outpatient Medications  Medication Sig Dispense Refill  . acetaminophen (TYLENOL) 325 MG tablet Take 1-2 tablets (325-650 mg total) by mouth every 6 (six) hours as needed for mild pain (pain score 1-3 or temp > 100.5).    Marland Kitchen albuterol (PROVENTIL HFA;VENTOLIN HFA) 108 (90 Base) MCG/ACT inhaler Inhale 2 puffs into the lungs every 6 (six) hours as needed for wheezing or shortness of breath. 18 g 0  . albuterol (PROVENTIL) (2.5 MG/3ML) 0.083% nebulizer solution Inhale 3 mLs (2.5 mg total) into the lungs 4 (four) times daily. 75 mL 0  . Ascorbic Acid (VITAMIN C WITH ROSE HIPS) 1000 MG tablet Take 1,000 mg by mouth daily.    . baclofen (LIORESAL) 10 MG tablet Take 10 mg by mouth 2 (two) times daily.    . diazepam (VALIUM) 2 MG tablet Take 1 tablet (2 mg total) by mouth 2 (two) times daily as needed for muscle spasms. 28 tablet 0  . diltiazem (DILACOR XR) 120 MG 24 hr capsule Take 120 mg by mouth daily.    Marland Kitchen docusate sodium (COLACE) 100 MG capsule Take 1 capsule (100 mg total) by mouth 2 (two) times daily. (Patient not taking: Reported on 06/14/2018) 10 capsule 0  . DULoxetine (CYMBALTA) 60 MG capsule Take 1 capsule (60 mg total) by mouth 2 (two) times daily. 60 capsule 0  . Ferrous Sulfate (SLOW FE)  142 (45 Fe) MG TBCR Take 45 mg by mouth daily.    . fluticasone (FLONASE) 50 MCG/ACT nasal spray Place 2 sprays into both nostrils daily. 16 g 0  . Fluticasone-Salmeterol (ADVAIR) 250-50 MCG/DOSE AEPB Inhale 1 puff into the lungs 2 (two) times daily. RINSE MOUTH AFTER USE 60 each 0  . guaiFENesin-dextromethorphan (ROBITUSSIN DM) 100-10 MG/5ML syrup Take 5 mLs by mouth every 6 (six) hours as needed for cough. 118 mL 0  . Lifitegrast (XIIDRA) 5 % SOLN Place 1 drop into both eyes 2 (two) times daily. for dry eyes  RESIDENT SUPPLY FROM HOME BOTTOM DRAWER OF MED CART 10 each 0  . magnesium hydroxide (MILK OF MAGNESIA) 400 MG/5ML suspension Take 30 mLs by mouth daily as needed for mild constipation. (Patient not taking: Reported on 03/09/2018) 360 mL 0  . meloxicam (MOBIC) 7.5 MG tablet Take 7.5 mg by mouth daily.    . montelukast (SINGULAIR) 10 MG tablet Take 1 tablet (10 mg total) by mouth daily. 30 tablet 0  . omeprazole (PRILOSEC) 40 MG capsule Take 1 capsule (40 mg total) by mouth 2 (two) times daily. (Patient taking differently: Take 40 mg by mouth daily. ) 60 capsule 0  . ondansetron (ZOFRAN) 4 MG tablet Take 1 tablet (4 mg total) by mouth every 6 (six) hours as needed for nausea. (Patient not taking:  Reported on 06/14/2018) 30 tablet 0  . oxyCODONE (OXY IR/ROXICODONE) 5 MG immediate release tablet Take 1-2 tablets (5-10 mg total) by mouth every 4 (four) hours as needed for moderate pain, severe pain or breakthrough pain. (Patient not taking: Reported on 04/07/2018) 120 tablet 0  . OXYGEN Inhale 2 L into the lungs every 4 (four) hours as needed. For dyspnea Check O2 saturations prior to placing on patient and at least every 4 hours after applying. Notify MD if oxygen saturations drop below baseline while receiving oxygen or no improvement in dyspnea    . simvastatin (ZOCOR) 40 MG tablet Take 1 tablet (40 mg total) by mouth daily. 30 tablet 0  . UNABLE TO FIND Diet Type:NAS heart healthy    . Vitamin D,  Cholecalciferol, 400 units TABS Take 400 Int'l Units by mouth daily.     No current facility-administered medications for this visit.     Functional Status:  In your present state of health, do you have any difficulty performing the following activities: 08/11/2018 06/14/2018  Hearing? N N  Vision? N N  Difficulty concentrating or making decisions? N N  Walking or climbing stairs? Y Y  Comment - -  Dressing or bathing? N N  Doing errands, shopping? [place] and eating ? N N  Using the Toilet? N N  In the past six months, have you accidently leaked urine? Y Y  Do you have problems with loss of bowel control? N N  Managing your Medications? N N  Managing your Finances? N N  Housekeeping or managing your Housekeeping? N N  Some recent data might be hidden    Fall/Depression Screening: Fall Risk  08/11/2018 06/14/2018 03/09/2018  Falls in the past year? 1 1 Yes  Number falls in past yr: 0 0 1  Injury with Fall? 1 1 Yes  Risk Factor Category  - - High Fall Risk  Risk for fall due to : - History of fall(s);Impaired balance/gait;Impaired mobility History of fall(s);Impaired mobility  Follow up - - Falls evaluation completed;Education provided;Falls prevention discussed   PHQ 2/9 Scores 08/11/2018 06/14/2018 03/09/2018 01/19/2018 01/11/2018  PHQ - 2 Score 0 0 0 0 0  Exception Documentation - - Patient refusal - -    Assessment:  Non productive cough Uses rescue inhaler 3 times a week Patient has swelling in feet and ankles Patient wears support hose No recent falls No COPD exacerbations Plan:  RN discussed COPD exacerbation RN discussed doing home exercises RN sent [employer] exercise program RN discussed eating healthy Patient has appointment 09/03/2018 with Vascular Dr RN will follow up within the month of March  Teresa Chambers [name] Care Management [phone]

## 2018-08-17 ENCOUNTER — Encounter: Payer: Self-pay | Admitting: *Deleted

## 2018-09-03 ENCOUNTER — Other Ambulatory Visit: Payer: Self-pay

## 2018-09-03 ENCOUNTER — Encounter (INDEPENDENT_AMBULATORY_CARE_PROVIDER_SITE_OTHER): Payer: Self-pay | Admitting: Vascular Surgery

## 2018-09-03 ENCOUNTER — Ambulatory Visit (INDEPENDENT_AMBULATORY_CARE_PROVIDER_SITE_OTHER): Payer: PPO | Admitting: Vascular Surgery

## 2018-09-03 VITALS — BP 125/75 | HR 105 | Resp 12 | Ht 69.0 in | Wt 176.0 lb

## 2018-09-03 DIAGNOSIS — M79604 Pain in right leg: Secondary | ICD-10-CM | POA: Diagnosis not present

## 2018-09-03 DIAGNOSIS — R600 Localized edema: Secondary | ICD-10-CM | POA: Diagnosis not present

## 2018-09-03 DIAGNOSIS — M79605 Pain in left leg: Secondary | ICD-10-CM

## 2018-09-03 DIAGNOSIS — I509 Heart failure, unspecified: Secondary | ICD-10-CM

## 2018-09-03 DIAGNOSIS — S72001A Fracture of unspecified part of neck of right femur, initial encounter for closed fracture: Secondary | ICD-10-CM

## 2018-09-03 DIAGNOSIS — E785 Hyperlipidemia, unspecified: Secondary | ICD-10-CM | POA: Diagnosis not present

## 2018-09-03 DIAGNOSIS — M7989 Other specified soft tissue disorders: Secondary | ICD-10-CM

## 2018-09-03 NOTE — Assessment & Plan Note (Signed)

## 2018-09-03 NOTE — Assessment & Plan Note (Signed)
Poor cardiac function certainly worsens and exacerbates lower extremity swelling type symptoms.

## 2018-09-03 NOTE — Progress Notes (Signed)
Patient ID: Teresa Chambers, female   DOB: Aug 17, 1935, 83 y.o.   MRN: [id number]  Chief Complaint  Patient presents with  . New Patient (Initial Visit)    HPI Teresa Chambers is a 83 y.o. female.  I am asked to see the patient by Dr. Lavera Guise for evaluation of leg pain and swelling.  The patient reports having a lot of issues with her legs over the past year or so.  She had a partial right hip replacement and was initially doing well from that.  She got bursitis which led to a lot of immobility and her legs began to swell a lot more.  Her right leg is the more severely affecting the 2 legs.  This has gradually decreased some, but it remains very tender to the touch and swollen.  The skin is thickened and she says it itches terribly.  She is having some swelling in her left leg as well although not as severe as the right.  No known history of DVT.  No fever or chills.  No open ulcerations or infection.   Past Medical History:  Diagnosis Date  . Acute thoracic back pain   . Asthma   . Chest pain    unspecified  . CHF (congestive heart failure) ([place]) 04/30/2017  . Chronic abdominal pain 05/01/2017   unspecified  . Congenital heart failure ([place])   . COPD (chronic obstructive pulmonary disease) ([place])   . Disease of upper respiratory system 04/30/2017  . Dyspnea on exertion   . Esophageal reflux disease 04/30/2017  . History of bone density study 06/28/2004  . Leg swelling   . Lumbar arthropathy   . Nasopharyngitis 04/30/2017  . Osteoarthropathy 04/30/2017  . Osteoporosis 04/30/2017  . Palpitations   . Sinusitis, acute 04/30/2017   unspecified    Past Surgical History:  Procedure Laterality Date  . CATARACT EXTRACTION     07/21/2001 - 07/20/2002  . CHOLECYSTECTOMY     07/21/1997 - 07/20/1998  . COLON SURGERY    . COLONOSCOPY     05/05/2000, 01/17/2000 Adenomatous Polyps   . COLONOSCOPY     11/05/2009, 12/23/2004, 07/07/2001   PH Adenomatous Polyps: [facility]BF 10/2014; recall ltr mailed  09/18/2014 (dw)  . ESOPHAGOGASTRODUODENOSCOPY     11/05/2009, 05/05/2000  . FLEXIBLE SIGMOIDOSCOPY  11/28/1999  . HAND SURGERY  2006  . HERNIA REPAIR    . HIP ARTHROPLASTY Right 02/11/2018   Procedure: ARTHROPLASTY BIPOLAR HIP (HEMIARTHROPLASTY);  Surgeon: Corky Mull, MD;  Location: [facility] ORS;  Service: Orthopedics;  Laterality: Right;  . LAPAROSCOPIC COLON RESECTION     2001    Family History  Problem Relation Age of Onset  . Heart disease Mother   . Heart disease Father   . Hypertension Son   . Hypertension Daughter      Social History Social History   Tobacco Use  . Smoking status: Never Smoker  . Smokeless tobacco: Never Used  Substance Use Topics  . Alcohol use: Not Currently    Frequency: Never  . Drug use: Never     Allergies  Allergen Reactions  . Codeine Nausea And Vomiting  . Lidocaine Swelling    Current Outpatient Medications  Medication Sig Dispense Refill  . acetaminophen (TYLENOL) 325 MG tablet Take 1-2 tablets (325-650 mg total) by mouth every 6 (six) hours as needed for mild pain (pain score 1-3 or temp > 100.5).    Marland Kitchen albuterol (PROVENTIL HFA;VENTOLIN HFA) 108 (90 Base) MCG/ACT inhaler Inhale  2 puffs into the lungs every 6 (six) hours as needed for wheezing or shortness of breath. 18 g 0  . albuterol (PROVENTIL) (2.5 MG/3ML) 0.083% nebulizer solution Inhale 3 mLs (2.5 mg total) into the lungs 4 (four) times daily. 75 mL 0  . Ascorbic Acid (VITAMIN C WITH ROSE HIPS) 1000 MG tablet Take 1,000 mg by mouth daily.    . diazepam (VALIUM) 2 MG tablet Take 1 tablet (2 mg total) by mouth 2 (two) times daily as needed for muscle spasms. (Patient taking differently: Take 2 mg by mouth daily. ) 28 tablet 0  . Ferrous Sulfate (SLOW FE) 142 (45 Fe) MG TBCR Take 45 mg by mouth daily.    . fluticasone (FLONASE) 50 MCG/ACT nasal spray Place 2 sprays into both nostrils daily. 16 g 0  . Fluticasone-Salmeterol (ADVAIR) 250-50 MCG/DOSE AEPB Inhale 1 puff into the lungs 2  (two) times daily. RINSE MOUTH AFTER USE 60 each 0  . guaiFENesin-dextromethorphan (ROBITUSSIN DM) 100-10 MG/5ML syrup Take 5 mLs by mouth every 6 (six) hours as needed for cough. 118 mL 0  . Lifitegrast (XIIDRA) 5 % SOLN Place 1 drop into both eyes 2 (two) times daily. for dry eyes  RESIDENT SUPPLY FROM HOME BOTTOM DRAWER OF MED CART 10 each 0  . montelukast (SINGULAIR) 10 MG tablet Take 1 tablet (10 mg total) by mouth daily. 30 tablet 0  . omeprazole (PRILOSEC) 40 MG capsule Take 1 capsule (40 mg total) by mouth 2 (two) times daily. (Patient taking differently: Take 40 mg by mouth daily. ) 60 capsule 0  . simvastatin (ZOCOR) 40 MG tablet Take 1 tablet (40 mg total) by mouth daily. 30 tablet 0  . Vitamin D, Cholecalciferol, 400 units TABS Take 400 Int'l Units by mouth daily.    . baclofen (LIORESAL) 10 MG tablet Take 10 mg by mouth 2 (two) times daily.    Marland Kitchen diltiazem (DILACOR XR) 120 MG 24 hr capsule Take 120 mg by mouth daily.    Marland Kitchen docusate sodium (COLACE) 100 MG capsule Take 1 capsule (100 mg total) by mouth 2 (two) times daily. (Patient not taking: Reported on 09/03/2018) 10 capsule 0  . DULoxetine (CYMBALTA) 60 MG capsule Take 1 capsule (60 mg total) by mouth 2 (two) times daily. (Patient not taking: Reported on 09/03/2018) 60 capsule 0  . magnesium hydroxide (MILK OF MAGNESIA) 400 MG/5ML suspension Take 30 mLs by mouth daily as needed for mild constipation. (Patient not taking: Reported on 09/03/2018) 360 mL 0  . meloxicam (MOBIC) 7.5 MG tablet Take 7.5 mg by mouth daily.    . ondansetron (ZOFRAN) 4 MG tablet Take 1 tablet (4 mg total) by mouth every 6 (six) hours as needed for nausea. (Patient not taking: Reported on 06/14/2018) 30 tablet 0  . oxyCODONE (OXY IR/ROXICODONE) 5 MG immediate release tablet Take 1-2 tablets (5-10 mg total) by mouth every 4 (four) hours as needed for moderate pain, severe pain or breakthrough pain. (Patient not taking: Reported on 04/07/2018) 120 tablet 0  . OXYGEN  Inhale 2 L into the lungs every 4 (four) hours as needed. For dyspnea Check O2 saturations prior to placing on patient and at least every 4 hours after applying. Notify MD if oxygen saturations drop below baseline while receiving oxygen or no improvement in dyspnea    . UNABLE TO FIND Diet Type:NAS heart healthy     No current facility-administered medications for this visit.       REVIEW OF SYSTEMS (  Negative unless checked)  Constitutional: [] Weight loss  [] Fever  [] Chills Cardiac: [] Chest pain   [] Chest pressure   [x] Palpitations   [] Shortness of breath when laying flat   [] Shortness of breath at rest   [] Shortness of breath with exertion. Vascular:  [x] Pain in legs with walking   [x] Pain in legs at rest   [] Pain in legs when laying flat   [] Claudication   [] Pain in feet when walking  [] Pain in feet at rest  [] Pain in feet when laying flat   [] History of DVT   [] Phlebitis   [x] Swelling in legs   [] Varicose veins   [] Non-healing ulcers Pulmonary:   [] Uses home oxygen   [] Productive cough   [] Hemoptysis   [] Wheeze  [x] COPD   [x] Asthma Neurologic:  [] Dizziness  [] Blackouts   [] Seizures   [] History of stroke   [] History of TIA  [] Aphasia   [] Temporary blindness   [] Dysphagia   [] Weakness or numbness in arms   [] Weakness or numbness in legs Musculoskeletal:  [] Arthritis   [] Joint swelling   [] Joint pain   [] Low back pain Hematologic:  [] Easy bruising  [] Easy bleeding   [] Hypercoagulable state   [] Anemic  [] Hepatitis Gastrointestinal:  [] Blood in stool   [] Vomiting blood  [] Gastroesophageal reflux/heartburn   [] Abdominal pain Genitourinary:  [] Chronic kidney disease   [] Difficult urination  [] Frequent urination  [] Burning with urination   [] Hematuria Skin:  [] Rashes   [] Ulcers   [] Wounds Psychological:  [] History of anxiety   []  History of major depression.    Physical Exam BP 125/75 (BP Location: Left Arm, Patient Position: Sitting, Cuff Size: Small)   Pulse (!) 105   Resp 12   Ht 5\' 9"   (1.753 m)   Wt 176 lb (79.8 kg)   BMI 25.99 kg/m  Gen:  WD/WN, NAD.  Appears younger than stated age Head: [place]/AT, No temporalis wasting.  Ear/Nose/Throat: Hearing grossly intact, nares w/o erythema or drainage, oropharynx w/o Erythema/Exudate Eyes: Conjunctiva clear, sclera non-icteric  Neck: trachea midline.   Pulmonary:  Good air movement, respirations not labored, no use of accessory muscles Cardiac: RRR, no JVD Vascular:  Vessel Right Left  Radial Palpable Palpable                          PT Not Palpable Trace Palpable  DP 1+ Palpable 1+ Palpable   Gastrointestinal: soft, non-tender/non-distended.  Musculoskeletal: M/S 5/5 throughout.  Extremities without ischemic changes.  No deformity or atrophy.  1-2+ right lower extremity edema, 1+ left lower extremity edema.  Stasis dermatitis changes are present bilaterally. Neurologic: Sensation grossly intact in extremities.  Symmetrical.  Speech is fluent. Motor exam as listed above. Psychiatric: Judgment intact, Mood & affect appropriate for pt's clinical situation. Dermatologic: No rashes or ulcers noted.  No cellulitis or open wounds.   Radiology No results found.  Labs No results found for this or any previous visit (from the past 2160 hour(s)).  Assessment/Plan:  Hyperlipemia lipid control important in reducing the progression of atherosclerotic disease. Continue statin therapy   Closed displaced fracture of right femoral neck (HCC) Immobility associated with this may have led to a significant portion of the swelling started.  This may also contribute to lymphedema type symptoms.  Congenital heart failure (HCC) Poor cardiac function certainly worsens and exacerbates lower extremity swelling type symptoms.  Pain in limb Likely multifactorial.  Lower extremity symptoms not entirely clear in their etiology.  We will plan ABIs in addition to a  venous work-up as below.  Swelling of limb I have had a long discussion  with the patient regarding swelling and why it  causes symptoms.  Patient will begin wearing graduated compression stockings class 1 (20-30 mmHg) on a daily basis a prescription was given. The patient will  beginning wearing the stockings first thing in the morning and removing them in the evening. The patient is instructed specifically not to sleep in the stockings.   In addition, behavioral modification will be initiated.  This will include frequent elevation, use of over the counter pain medications and exercise such as walking.  I have reviewed systemic causes for chronic edema such as liver, kidney and cardiac etiologies.  The patient denies problems with these organ systems.    Consideration for a lymph pump will also be made based upon the effectiveness of conservative therapy.  This would help to improve the edema control and prevent sequela such as ulcers and infections   Patient should undergo duplex ultrasound of the venous system to ensure that DVT or reflux is not present.  The patient will follow-up with me after the ultrasound.        Leotis Pain 09/03/2018, 3:43 PM   This note was created with Dragon medical transcription system.  Any errors from dictation are unintentional.

## 2018-09-03 NOTE — Assessment & Plan Note (Signed)
Likely multifactorial.  Lower extremity symptoms not entirely clear in their etiology.  We will plan ABIs in addition to a venous work-up as below.

## 2018-09-03 NOTE — Assessment & Plan Note (Signed)
Immobility associated with this may have led to a significant portion of the swelling started.  This may also contribute to lymphedema type symptoms.

## 2018-09-03 NOTE — Patient Instructions (Signed)

## 2018-09-03 NOTE — Assessment & Plan Note (Signed)
lipid control important in reducing the progression of atherosclerotic disease. Continue statin therapy  

## 2018-09-06 ENCOUNTER — Encounter (INDEPENDENT_AMBULATORY_CARE_PROVIDER_SITE_OTHER): Payer: Self-pay | Admitting: Vascular Surgery

## 2018-09-06 ENCOUNTER — Ambulatory Visit (INDEPENDENT_AMBULATORY_CARE_PROVIDER_SITE_OTHER): Payer: PPO | Admitting: Vascular Surgery

## 2018-09-06 ENCOUNTER — Ambulatory Visit (INDEPENDENT_AMBULATORY_CARE_PROVIDER_SITE_OTHER): Payer: PPO

## 2018-09-06 ENCOUNTER — Other Ambulatory Visit: Payer: Self-pay

## 2018-09-06 VITALS — BP 120/76 | HR 83 | Resp 10 | Ht 69.0 in | Wt 174.0 lb

## 2018-09-06 DIAGNOSIS — I872 Venous insufficiency (chronic) (peripheral): Secondary | ICD-10-CM | POA: Diagnosis not present

## 2018-09-06 DIAGNOSIS — R600 Localized edema: Secondary | ICD-10-CM

## 2018-09-06 DIAGNOSIS — I890 Lymphedema, not elsewhere classified: Secondary | ICD-10-CM | POA: Diagnosis not present

## 2018-09-06 DIAGNOSIS — M79604 Pain in right leg: Secondary | ICD-10-CM

## 2018-09-06 DIAGNOSIS — M7989 Other specified soft tissue disorders: Secondary | ICD-10-CM

## 2018-09-06 DIAGNOSIS — M79605 Pain in left leg: Secondary | ICD-10-CM

## 2018-09-06 NOTE — Progress Notes (Signed)
Subjective:    Patient ID: Teresa Chambers, female    DOB: Dec 07, 1935, 83 y.o.   MRN: [id number] Chief Complaint  Patient presents with  . Follow-up   Patient presents to review vascular studies.  The patient was last seen on September 03, 2018 for initial evaluation of bilateral lower extremity edema and discomfort.  The patient symptoms are stable.  The patient has not started engaging in conservative therapy.  The patient underwent a bilateral ABI which was notable for: Right: 1.30, triphasic tibial waveforms, normal great toe waveforms. Left: 1.16, triphasic tibial waveforms, normal great toe waveforms. The patient also underwent a bilateral lower extremity venous duplex which was notable for: Right: Venous reflux was found in the common femoral vein, saphenofemoral junction and great saphenous vein (full length of the vein). Left: Venous reflux was noted in the common femoral vein, saphenofemoral junction and popliteal vein. There is no evidence of deep vein or superficial venous thrombosis bilaterally. She denies any fever, nausea vomiting.  Review of Systems  Constitutional: Negative.   HENT: Negative.   Eyes: Negative.   Respiratory: Negative.   Cardiovascular: Positive for leg swelling.  Gastrointestinal: Negative.   Endocrine: Negative.   Genitourinary: Negative.   Musculoskeletal: Negative.   Skin: Negative.   Allergic/Immunologic: Negative.   Neurological: Negative.   Hematological: Negative.   Psychiatric/Behavioral: Negative.       Objective:   Physical Exam Vitals signs reviewed.  Constitutional:      Appearance: Normal appearance.  HENT:     Head: Normocephalic and atraumatic.     Right Ear: External ear normal.     Left Ear: External ear normal.     Nose: Nose normal.     Mouth/Throat:     Mouth: Mucous membranes are moist.     Pharynx: Oropharynx is clear.  Eyes:     Extraocular Movements: Extraocular movements intact.     Conjunctiva/sclera:  Conjunctivae normal.     Pupils: Pupils are equal, round, and reactive to light.  Neck:     Musculoskeletal: Normal range of motion.  Cardiovascular:     Rate and Rhythm: Normal rate and regular rhythm.  Pulmonary:     Effort: Pulmonary effort is normal.     Breath sounds: Normal breath sounds.  Musculoskeletal: Normal range of motion.        General: Swelling (Mild to moderate bilateral lower extremity edema) present.  Skin:    Comments: Mild stasis dermatitis is noted.  There is no fibrosis or active ulcerations noted at this time bilaterally  Neurological:     General: No focal deficit present.     Mental Status: She is alert and oriented to person, place, and time. Mental status is at baseline.  Psychiatric:        Mood and Affect: Mood normal.        Behavior: Behavior normal.        Thought Content: Thought content normal.        Judgment: Judgment normal.    BP 120/76 (BP Location: Left Arm, Patient Position: Sitting, Cuff Size: Small)   Pulse 83   Resp 10   Ht 5\' 9"  (1.753 m)   Wt 174 lb (78.9 kg)   BMI 25.70 kg/m   Past Medical History:  Diagnosis Date  . Acute thoracic back pain   . Asthma   . Chest pain    unspecified  . CHF (congestive heart failure) ([place]) 04/30/2017  . Chronic abdominal pain 05/01/2017  unspecified  . Congenital heart failure ([place])   . COPD (chronic obstructive pulmonary disease) ([place])   . Disease of upper respiratory system 04/30/2017  . Dyspnea on exertion   . Esophageal reflux disease 04/30/2017  . History of bone density study 06/28/2004  . Leg swelling   . Lumbar arthropathy   . Nasopharyngitis 04/30/2017  . Osteoarthropathy 04/30/2017  . Osteoporosis 04/30/2017  . Palpitations   . Sinusitis, acute 04/30/2017   unspecified   Social History   Socioeconomic History  . Marital status: Divorced    Spouse name: Not on file  . Number of children: 5  . Years of education: 15.5  . Highest education level: High school graduate    Occupational History  . Occupation: retired  Scientific laboratory technician  . Financial resource strain: Not hard at all  . Food insecurity:    Worry: Never true    Inability: Never true  . Transportation needs:    Medical: No    Non-medical: No  Tobacco Use  . Smoking status: Never Smoker  . Smokeless tobacco: Never Used  Substance and Sexual Activity  . Alcohol use: Not Currently    Frequency: Never  . Drug use: Never  . Sexual activity: Not Currently  Lifestyle  . Physical activity:    Days per week: 0 days    Minutes per session: 0 min  . Stress: Only a little  Relationships  . Social connections:    Talks on phone: More than three times a week    Gets together: More than three times a week    Attends religious service: Never    Active member of club or organization: No    Attends meetings of clubs or organizations: Never    Relationship status: Divorced  . Intimate partner violence:    Fear of current or ex partner: Patient refused    Emotionally abused: Patient refused    Physically abused: Patient refused    Forced sexual activity: Patient refused  Other Topics Concern  . Not on file  Social History Narrative   Lives independently, has home at [facility] she enjoys traveling to   Past Surgical History:  Procedure Laterality Date  . CATARACT EXTRACTION     07/21/2001 - 07/20/2002  . CHOLECYSTECTOMY     07/21/1997 - 07/20/1998  . COLON SURGERY    . COLONOSCOPY     05/05/2000, 01/17/2000 Adenomatous Polyps   . COLONOSCOPY     11/05/2009, 12/23/2004, 07/07/2001   PH Adenomatous Polyps: [facility] 10/2014; recall ltr mailed 09/18/2014 (dw)  . ESOPHAGOGASTRODUODENOSCOPY     11/05/2009, 05/05/2000  . FLEXIBLE SIGMOIDOSCOPY  11/28/1999  . HAND SURGERY  2006  . HERNIA REPAIR    . HIP ARTHROPLASTY Right 02/11/2018   Procedure: ARTHROPLASTY BIPOLAR HIP (HEMIARTHROPLASTY);  Surgeon: Corky Mull, MD;  Location: [facility] ORS;  Service: Orthopedics;  Laterality: Right;  . LAPAROSCOPIC COLON  RESECTION     2001   Family History  Problem Relation Age of Onset  . Heart disease Mother   . Heart disease Father   . Hypertension Son   . Hypertension Daughter    Allergies  Allergen Reactions  . Codeine Nausea And Vomiting  . Lidocaine Swelling      Assessment & Plan:  Patient presents to review vascular studies.  The patient was last seen on September 03, 2018 for initial evaluation of bilateral lower extremity edema and discomfort.  The patient symptoms are stable.  The patient has not started engaging  in conservative therapy.  The patient underwent a bilateral ABI which was notable for: Right: 1.30, triphasic tibial waveforms, normal great toe waveforms. Left: 1.16, triphasic tibial waveforms, normal great toe waveforms. The patient also underwent a bilateral lower extremity venous duplex which was notable for: Right: Venous reflux was found in the common femoral vein, saphenofemoral junction and great saphenous vein (full length of the vein). Left: Venous reflux was noted in the common femoral vein, saphenofemoral junction and popliteal vein. There is no evidence of deep vein or superficial venous thrombosis bilaterally. She denies any fever, nausea vomiting.  1. Chronic venous insufficiency - New I have discussed chronic venous insufficiency with the patient, why it causes symptoms and how to manage them.  The patient was encouraged to wear graduated compression stockings (20-30 mmHg) on a daily basis. The patient was instructed to begin wearing the stockings first thing in the morning and removing them in the evening. The patient was instructed specifically not to sleep in the stockings. Prescription given In addition, behavioral modification including elevation during the day will be initiated. Anti-inflammatories for pain. The patient may benefit from endovenous laser ablation to the right great saphenous vein in the future The patient will follow up in three months to  asses conservative management.  Information on chronic venous insufficiency and compression stockings was given to the patient. The patient was instructed to call the office in the interim if any worsening edema or ulcerations to the legs, feet or toes occurs. The patient expresses their understanding.  2. Lymphedema - New Studies reviewed with the patient. I spent time discussing her duplex results, the difference between venous insufficiency vs lymphedema and why her swelling is not venous in origin. I discussed lymphedema and what symptoms it causes and how to manage them. The patient was encouraged to wear graduated compression stockings (20-30 mmHg) on a daily basis. The patient was instructed to begin wearing the stockings first thing in the morning and removing them in the evening. The patient was instructed specifically not to sleep in the stockings. Prescription given. In addition, behavioral modification including elevation during the day will be initiated. We discussed a lymphedema pump if conventional therapy goes not work. The patient was advised to follow up in three months after wearing her compression stockings daily with elevation. Information on compression stockings, lymphedema and the lymphedema pump was given to the patient. The patient was instructed to call the office in the interim if any worsening edema or ulcerations to the legs, feet or toes occurs. The patient expresses their understanding.  Current Outpatient Medications on File Prior to Visit  Medication Sig Dispense Refill  . acetaminophen (TYLENOL) 325 MG tablet Take 1-2 tablets (325-650 mg total) by mouth every 6 (six) hours as needed for mild pain (pain score 1-3 or temp > 100.5).    Marland Kitchen albuterol (PROVENTIL HFA;VENTOLIN HFA) 108 (90 Base) MCG/ACT inhaler Inhale 2 puffs into the lungs every 6 (six) hours as needed for wheezing or shortness of breath. 18 g 0  . albuterol (PROVENTIL) (2.5 MG/3ML) 0.083% nebulizer  solution Inhale 3 mLs (2.5 mg total) into the lungs 4 (four) times daily. 75 mL 0  . Ascorbic Acid (VITAMIN C WITH ROSE HIPS) 1000 MG tablet Take 1,000 mg by mouth daily.    . diazepam (VALIUM) 2 MG tablet Take 1 tablet (2 mg total) by mouth 2 (two) times daily as needed for muscle spasms. (Patient taking differently: Take 2 mg by mouth daily. ) 28  tablet 0  . Ferrous Sulfate (SLOW FE) 142 (45 Fe) MG TBCR Take 45 mg by mouth daily.    . fluticasone (FLONASE) 50 MCG/ACT nasal spray Place 2 sprays into both nostrils daily. 16 g 0  . Fluticasone-Salmeterol (ADVAIR) 250-50 MCG/DOSE AEPB Inhale 1 puff into the lungs 2 (two) times daily. RINSE MOUTH AFTER USE 60 each 0  . guaiFENesin-dextromethorphan (ROBITUSSIN DM) 100-10 MG/5ML syrup Take 5 mLs by mouth every 6 (six) hours as needed for cough. 118 mL 0  . Lifitegrast (XIIDRA) 5 % SOLN Place 1 drop into both eyes 2 (two) times daily. for dry eyes  RESIDENT SUPPLY FROM HOME BOTTOM DRAWER OF MED CART 10 each 0  . montelukast (SINGULAIR) 10 MG tablet Take 1 tablet (10 mg total) by mouth daily. 30 tablet 0  . omeprazole (PRILOSEC) 40 MG capsule Take 1 capsule (40 mg total) by mouth 2 (two) times daily. (Patient taking differently: Take 40 mg by mouth daily. ) 60 capsule 0  . simvastatin (ZOCOR) 40 MG tablet Take 1 tablet (40 mg total) by mouth daily. 30 tablet 0  . Vitamin D, Cholecalciferol, 400 units TABS Take 400 Int'l Units by mouth daily.    . baclofen (LIORESAL) 10 MG tablet Take 10 mg by mouth 2 (two) times daily.    Marland Kitchen diltiazem (DILACOR XR) 120 MG 24 hr capsule Take 120 mg by mouth daily.    Marland Kitchen docusate sodium (COLACE) 100 MG capsule Take 1 capsule (100 mg total) by mouth 2 (two) times daily. (Patient not taking: Reported on 09/03/2018) 10 capsule 0  . DULoxetine (CYMBALTA) 60 MG capsule Take 1 capsule (60 mg total) by mouth 2 (two) times daily. (Patient not taking: Reported on 09/03/2018) 60 capsule 0  . magnesium hydroxide (MILK OF MAGNESIA) 400 MG/5ML  suspension Take 30 mLs by mouth daily as needed for mild constipation. (Patient not taking: Reported on 09/03/2018) 360 mL 0  . meloxicam (MOBIC) 7.5 MG tablet Take 7.5 mg by mouth daily.    . ondansetron (ZOFRAN) 4 MG tablet Take 1 tablet (4 mg total) by mouth every 6 (six) hours as needed for nausea. (Patient not taking: Reported on 06/14/2018) 30 tablet 0  . oxyCODONE (OXY IR/ROXICODONE) 5 MG immediate release tablet Take 1-2 tablets (5-10 mg total) by mouth every 4 (four) hours as needed for moderate pain, severe pain or breakthrough pain. (Patient not taking: Reported on 04/07/2018) 120 tablet 0  . OXYGEN Inhale 2 L into the lungs every 4 (four) hours as needed. For dyspnea Check O2 saturations prior to placing on patient and at least every 4 hours after applying. Notify MD if oxygen saturations drop below baseline while receiving oxygen or no improvement in dyspnea    . UNABLE TO FIND Diet Type:NAS heart healthy     No current facility-administered medications on file prior to visit.     There are no Patient Instructions on file for this visit. No follow-ups on file.   Krisandra Bueno A Jani Ploeger, PA-C

## 2018-10-07 ENCOUNTER — Other Ambulatory Visit: Payer: Self-pay | Admitting: *Deleted

## 2018-10-07 NOTE — Patient Outreach (Signed)
[name]) Care Management  10/07/2018   Teresa Chambers 1936-01-14 [id number]  [place] telephone call to patient.  Hipaa compliance verified. Per patient she is in the green zone. Patient stated she had a little non productive cough a couple of nights and took her cough medication and it is better.  Patient is using her inhalers as per ordered. Per patient she is rarely having to use her rescue inhaler and nebulizer. She went to the vascular surgeon and has venous stasis and lymph edema. Patient is wearing support hose as per Dr order. Patient has agreed to follow up outreach calls.    Current Medications:  Current Outpatient Medications  Medication Sig Dispense Refill  . acetaminophen (TYLENOL) 325 MG tablet Take 1-2 tablets (325-650 mg total) by mouth every 6 (six) hours as needed for mild pain (pain score 1-3 or temp > 100.5).    Marland Kitchen albuterol (PROVENTIL HFA;VENTOLIN HFA) 108 (90 Base) MCG/ACT inhaler Inhale 2 puffs into the lungs every 6 (six) hours as needed for wheezing or shortness of breath. 18 g 0  . albuterol (PROVENTIL) (2.5 MG/3ML) 0.083% nebulizer solution Inhale 3 mLs (2.5 mg total) into the lungs 4 (four) times daily. 75 mL 0  . Ascorbic Acid (VITAMIN C WITH ROSE HIPS) 1000 MG tablet Take 1,000 mg by mouth daily.    Marland Kitchen diltiazem (DILACOR XR) 120 MG 24 hr capsule Take 120 mg by mouth daily.    . diphenhydramine-acetaminophen (TYLENOL PM) 25-500 MG TABS tablet Take 1 tablet by mouth at bedtime.    . DULoxetine (CYMBALTA) 60 MG capsule Take 1 capsule (60 mg total) by mouth 2 (two) times daily. 60 capsule 0  . Ferrous Sulfate (SLOW FE) 142 (45 Fe) MG TBCR Take 45 mg by mouth daily.    . fluticasone (FLONASE) 50 MCG/ACT nasal spray Place 2 sprays into both nostrils daily. 16 g 0  . Fluticasone-Salmeterol (ADVAIR) 250-50 MCG/DOSE AEPB Inhale 1 puff into the lungs 2 (two) times daily. RINSE MOUTH AFTER USE 60 each 0  . guaiFENesin-dextromethorphan (ROBITUSSIN  DM) 100-10 MG/5ML syrup Take 5 mLs by mouth every 6 (six) hours as needed for cough. 118 mL 0  . Lifitegrast (XIIDRA) 5 % SOLN Place 1 drop into both eyes 2 (two) times daily. for dry eyes  RESIDENT SUPPLY FROM HOME BOTTOM DRAWER OF MED CART 10 each 0  . meloxicam (MOBIC) 7.5 MG tablet Take 7.5 mg by mouth daily.    . montelukast (SINGULAIR) 10 MG tablet Take 1 tablet (10 mg total) by mouth daily. 30 tablet 0  . omeprazole (PRILOSEC) 40 MG capsule Take 1 capsule (40 mg total) by mouth 2 (two) times daily. (Patient taking differently: Take 40 mg by mouth daily. ) 60 capsule 0  . simvastatin (ZOCOR) 40 MG tablet Take 1 tablet (40 mg total) by mouth daily. 30 tablet 0  . Vitamin D, Cholecalciferol, 400 units TABS Take 400 Int'l Units by mouth daily.    . baclofen (LIORESAL) 10 MG tablet Take 10 mg by mouth 2 (two) times daily.    . diazepam (VALIUM) 2 MG tablet Take 1 tablet (2 mg total) by mouth 2 (two) times daily as needed for muscle spasms. (Patient taking differently: Take 2 mg by mouth daily. ) 28 tablet 0  . docusate sodium (COLACE) 100 MG capsule Take 1 capsule (100 mg total) by mouth 2 (two) times daily. (Patient not taking: Reported on 09/03/2018) 10 capsule 0  . magnesium hydroxide (  MILK OF MAGNESIA) 400 MG/5ML suspension Take 30 mLs by mouth daily as needed for mild constipation. (Patient not taking: Reported on 09/03/2018) 360 mL 0  . ondansetron (ZOFRAN) 4 MG tablet Take 1 tablet (4 mg total) by mouth every 6 (six) hours as needed for nausea. (Patient not taking: Reported on 06/14/2018) 30 tablet 0  . oxyCODONE (OXY IR/ROXICODONE) 5 MG immediate release tablet Take 1-2 tablets (5-10 mg total) by mouth every 4 (four) hours as needed for moderate pain, severe pain or breakthrough pain. (Patient not taking: Reported on 04/07/2018) 120 tablet 0  . OXYGEN Inhale 2 L into the lungs every 4 (four) hours as needed. For dyspnea Check O2 saturations prior to placing on patient and at least every 4 hours  after applying. Notify MD if oxygen saturations drop below baseline while receiving oxygen or no improvement in dyspnea    . UNABLE TO FIND Diet Type:NAS heart healthy     No current facility-administered medications for this visit.     Functional Status:  In your present state of health, do you have any difficulty performing the following activities: 10/07/2018 08/11/2018  Hearing? N N  Vision? N N  Difficulty concentrating or making decisions? N N  Walking or climbing stairs? Y Y  Comment lymph edema lower extremities -  Dressing or bathing? N N  Doing errands, shopping? Y Y  Comment Patient is still driving but the lymph edema is difficult at times -  Preparing Food and eating ? N N  Using the Toilet? N N  In the past six months, have you accidently leaked urine? Y Y  Comment stress incont at times -  Do you have problems with loss of bowel control? N N  Managing your Medications? N N  Managing your Finances? N N  Housekeeping or managing your Housekeeping? N N  Some recent data might be hidden    Fall/Depression Screening: Fall Risk  10/07/2018 08/11/2018 06/14/2018  Falls in the past year? '1 1 1  '$ Number falls in past yr: 0 0 0  Injury with Fall? '1 1 1  '$ Risk Factor Category  - - -  Risk for fall due to : History of fall(s);Impaired balance/gait;Impaired mobility - History of fall(s);Impaired balance/gait;Impaired mobility  Follow up - - -   PHQ 2/9 Scores 10/07/2018 08/11/2018 06/14/2018 03/09/2018 01/19/2018 01/11/2018  PHQ - 2 Score 0 0 0 0 0 0  Exception Documentation - - - Patient refusal - -   THN CM Care Plan Problem One     Most Recent Value  Care Plan Problem One  Knowledge Deficit in Self management of COPD  Role Documenting the Problem One  [place] for Problem One  Active  THN Long Term Goal   Patient will not have any COPD exacerbations within the next 90 days  Interventions for Problem One Long Term Goal  RN reiterated COPD exacerbation symptoms. RN  will follow up with further discussion especially during the coronavirus t  THN CM Short Term Goal #1   Patient will verbalize better understanding of stages of lower extremityl edema within the next 30 days  THN CM Short Term Goal #1 Start Date  10/07/18  Interventions for Short Term Goal #1  RN discused lower extremity edema. RN discussed making sure she wears the support hose even when edema has decreased. RN will follow up with further discussion  THN CM Short Term Goal #2   Patient will verbalize having a  better understanding of COPD and physical activity within the next 30 days   THN CM Short Term Goal #2 Met Date  10/07/18  [facility] CM Short Term Goal #3  Patient will a better understanding of an Eating Plan for COPD within the next 30 days  Interventions for Short Tern Goal #3  Rn reiterated the small meals. Patient stated she use to and is trying to get back to it. RN will follow up with further discussion       Assessment:  Patient is int he green zone Patient is using inhalers and nebulizer as per ordered Patient is drinking boost nutritional supplement   Plan:  RN reiterated medication adherence Pharmacy referral  RN sent boost coupons RN discussed wearing compression hose as per Dr order RN sent educational material on Lymphedema RN sent educational material on Chronic Venous insufficiency RN sent educational material on Edema RN will follow up within the month of April  Parnell Spieler [place] Management [phone]

## 2018-10-13 ENCOUNTER — Telehealth: Payer: Self-pay | Admitting: Pharmacist

## 2018-10-13 NOTE — Patient Outreach (Signed)
[facility]) Care Management  10/13/2018  Teresa Chambers 11-10-1935 [id number]  Patient was called regarding a medication question with diltiazem and simvastatin. Unfortunately, she did not answer her phone. HIPAA compliant message was left on her voicemail.  Plan: Call patient back in 5-7 business days. Send patient an unsuccessful contact letter.  Elayne Guerin, PharmD, [place] Clinical Pharmacist [phone]

## 2018-10-17 ENCOUNTER — Other Ambulatory Visit: Payer: Self-pay | Admitting: Adult Health

## 2018-10-17 DIAGNOSIS — J309 Allergic rhinitis, unspecified: Secondary | ICD-10-CM

## 2018-10-21 ENCOUNTER — Other Ambulatory Visit: Payer: Self-pay | Admitting: Pharmacist

## 2018-10-21 ENCOUNTER — Other Ambulatory Visit: Payer: Self-pay | Admitting: Pharmacy Technician

## 2018-10-21 NOTE — Patient Outreach (Signed)
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  [name] [name], [name] Management [phone]

## 2018-10-21 NOTE — Patient Outreach (Signed)
[place] [name]) Care Management  [place]   10/21/2018  Teresa Chambers 1935/08/11 [id number]  Reason for referral: Medication Assistance  Referral source: [facility] Nurse Referral medication(s): Advair Current insurance:HTA  HPI:  Patient is an 83 year old female with multiple medical conditions including but not limited to: COPD, dry eyes, anemia, chronic back pain, reflux esophagitis, CHF, hyperlipidemia, and osteoporosis.   Spoke with patient via telephone. HIPAA identifiers were obtained.   Objective: Allergies  Allergen Reactions  . Codeine Nausea And Vomiting  . Lidocaine Swelling    Medications Reviewed Today    Reviewed by Elayne Guerin, [facility] (Pharmacist) on 10/21/18 at 1033  Med List Status: <None>  Medication Order Taking? Sig Documenting Provider Last Dose Status Informant  acetaminophen (TYLENOL) 325 MG tablet [id number] Yes Take 1-2 tablets (325-650 mg total) by mouth every 6 (six) hours as needed for mild pain (pain score 1-3 or temp > 100.5). Nicholes Mango, MD Taking Active   albuterol (PROVENTIL HFA;VENTOLIN HFA) 108 (90 Base) MCG/ACT inhaler [id number] Yes Inhale 2 puffs into the lungs every 6 (six) hours as needed for wheezing or shortness of breath. Medina-Vargas, Monina C, NP Taking Active   albuterol (PROVENTIL) (2.5 MG/3ML) 0.083% nebulizer solution [id number] Yes Inhale 3 mLs (2.5 mg total) into the lungs 4 (four) times daily. Medina-Vargas, Monina C, NP Taking Active   Ascorbic Acid (VITAMIN C WITH ROSE HIPS) 1000 MG tablet [id number] Yes Take 1,000 mg by mouth daily. [provider] Taking Active   diazepam (VALIUM) 2 MG tablet [id number] Yes Take 1 tablet (2 mg total) by mouth 2 (two) times daily as needed for muscle spasms.  Patient taking differently:  Take 2 mg by mouth daily.    Gerlene Fee, NP Taking Active            Med Note Howell Rucks, Leafy Half Oct 07, 2018 12:45 PM) 1/2 tablet twice a day  diltiazem (DILACOR XR) 120  MG 24 hr capsule [id number] Yes Take 120 mg by mouth daily. [provider] Taking Active Self  diphenhydramine-acetaminophen (TYLENOL PM) 25-500 MG TABS tablet [id number] Yes Take 1 tablet by mouth at bedtime. [provider] Taking Active Self  DULoxetine (CYMBALTA) 60 MG capsule [id number] Yes Take 1 capsule (60 mg total) by mouth 2 (two) times daily. Medina-Vargas, Monina C, NP Taking Active   Ferrous Sulfate (SLOW FE) 142 (45 Fe) MG TBCR [id number] Yes Take 45 mg by mouth daily. [provider] Taking Active   fluticasone (FLONASE) 50 MCG/ACT nasal spray [id number] Yes Place 2 sprays into both nostrils daily. Medina-Vargas, Monina C, NP Taking Active   Fluticasone-Salmeterol (ADVAIR) 250-50 MCG/DOSE AEPB [id number] Yes Inhale 1 puff into the lungs 2 (two) times daily. RINSE MOUTH AFTER USE Medina-Vargas, Monina C, NP Taking Active   guaiFENesin-dextromethorphan (ROBITUSSIN DM) 100-10 MG/5ML syrup [id number] Yes Take 5 mLs by mouth every 6 (six) hours as needed for cough. Medina-Vargas, Monina C, NP Taking Active   Lifitegrast (XIIDRA) 5 % SOLN [id number] Yes Place 1 drop into both eyes 2 (two) times daily. for dry eyes  RESIDENT SUPPLY FROM HOME BOTTOM DRAWER OF MED CART Medina-Vargas, Monina C, NP Taking Active   montelukast (SINGULAIR) 10 MG tablet [id number] Yes Take 1 tablet (10 mg total) by mouth daily. Medina-Vargas, Monina C, NP Taking Active   omeprazole (PRILOSEC) 40 MG capsule [id number] Yes Take 1 capsule (40 mg total) by mouth 2 (two) times daily.  Patient taking differently:  Take 40 mg by mouth daily.    Medina-Vargas, Monina C, NP Taking Active   simvastatin (ZOCOR) 40 MG tablet [id number] Yes Take 1 tablet (40 mg total) by mouth daily. Medina-Vargas, Senaida Lange, NP Taking Active         Discontinued 10/21/18 1033 (Completed Course)   Vitamin D, Cholecalciferol, 400 units TABS [id number] Yes Take 400 Int'l Units by mouth daily. [provider] Taking Active  Self          Assessment:  Drugs sorted by system:  Neurologic/Psychologic: Diazepam, Duloxetine,   Cardiovascular: Diltiazem, Simvastatin  Pulmonary/Allergy: Albuterol HFA, Albuterol Nebulizer  solution, Diltiazem, Fluticasone nasal spray, Montelukast, Advair, Guaifenesin-dextromethorphan  Gastrointestinal: Omeprazole,  Pain: Acetaminophen, Acetaminophen/Diphenhydramine  Vitamins/Minerals/Supplements: Ascorbic Acid, Ferrous Sulfate, Vitamin D  Miscellaneous: Xiidra,   Medication Review Findings:  . Increased risk of myopathy-Simvastatin-Diltiazem --Diltiazem decreases the metabolism of simvastatin . Dose to high-max recommended dose of simvastatin when dosed with diltiazem is 10mg  daily vs 40mg  daily   Medication Assistance Findings:  Medication assistance needs identified.    Advair-patient has not spent the required $600 in out of pocket medication expenses to qualify.  [name] could be an option for her if her provider agrees.   Dr. Joesph July office was called to inquire about [facility] switch as well as the drug-drug interaction between simvastatin and Diltiazem  Additional medication assistance options reviewed with patient as warranted:  No other options identified  Plan: I will route patient assistance letter to [place] technician who will coordinate patient assistance program application process for medications listed above.  [facility] pharmacy technician will assist with obtaining all required documents from both patient and provider(s) and submit application(s) once completed.    Follow up in 1-2 weeks.  Elayne Guerin, PharmD, [place] Clinical Pharmacist [phone]

## 2018-10-21 NOTE — Patient Outreach (Signed)
[place] [facility]) Care Management  10/21/2018  Teresa Chambers 1935-11-24 [id number]   Dr. Joesph July nurse called in response to my message left about switching Advair to [facility] and the drug interaction and dose too high of Simvastatin and Diltiazem.  Nurse said provider was aware of the reaction and that it was ok to switch to [facility].  Forms have already been processed for the patient.  Elayne Guerin, PharmD, [place] Clinical Pharmacist [phone]

## 2018-11-01 ENCOUNTER — Other Ambulatory Visit: Payer: Self-pay | Admitting: Pharmacy Technician

## 2018-11-01 NOTE — Patient Outreach (Signed)
[place] [facility]) Care Management  11/01/2018  ADALIZ DOBIS September 29, 1935 [id number]  Successful outreach call placed to patient in regards to [place] application for [employer].  Spoke to patient, HIPAA identifiers verified. Patient informed she had received and mailed back the form on Saturday October 30, 2018.  Will be on the lookout for the application and if not received in 10-14 business days will followup with patient at that time.  Marcelles Clinard P. Tasheba Henson, [place] Management [phone]

## 2018-11-02 ENCOUNTER — Other Ambulatory Visit: Payer: Self-pay | Admitting: Pharmacy Technician

## 2018-11-02 NOTE — Patient Outreach (Signed)
[facility]) Care Management  11/02/2018  Teresa Chambers March 09, 1936 [id number]    Care coordination call placed to Dr. [name] office in regards to [employer] application for [employer] and Proventil.  Had to leave a message on the nurses's line for [name] to return my call.  Was calling to inquire if they had received the provider's portion of the application that was inter officed to them via courier on 10/22/2018.  Once the provider's part of the application is returned then the application can be submitted to [name] as we have already received the patient's portion back.  Idelle Reimann P. [name], [place] Management [phone]

## 2018-11-09 ENCOUNTER — Other Ambulatory Visit: Payer: Self-pay | Admitting: *Deleted

## 2018-11-09 ENCOUNTER — Ambulatory Visit: Payer: PPO | Admitting: Pharmacist

## 2018-11-09 DIAGNOSIS — K219 Gastro-esophageal reflux disease without esophagitis: Secondary | ICD-10-CM | POA: Diagnosis not present

## 2018-11-09 DIAGNOSIS — M47816 Spondylosis without myelopathy or radiculopathy, lumbar region: Secondary | ICD-10-CM | POA: Diagnosis not present

## 2018-11-09 DIAGNOSIS — I10 Essential (primary) hypertension: Secondary | ICD-10-CM | POA: Diagnosis not present

## 2018-11-09 DIAGNOSIS — E669 Obesity, unspecified: Secondary | ICD-10-CM | POA: Diagnosis not present

## 2018-11-10 NOTE — Patient Outreach (Signed)
[place] [name]) Care Management  11/10/2018   Teresa Chambers January 30, 1936 [id number]  RN Health Coach telephone call to patient.  [place] compliance verified. Per patient she is doing good. Patient stated that she has a dry hacky like cough at night. She takes her cough medication and feels much better.  Patient stated that she is short of breath on exertion. Per patient it is not more than usual. Patient stated that she wears her compression hose sometimes and notices that the she has swelling when she don't wear them and some discomfort. Patient stated she has one leg a little shorter and needs to use her cane. Per patient she has went out and does the social distancing but does not have a mask.  Patient has agreed to further outreach calls.   Current Medications:  Current Outpatient Medications  Medication Sig Dispense Refill  . acetaminophen (TYLENOL) 325 MG tablet Take 1-2 tablets (325-650 mg total) by mouth every 6 (six) hours as needed for mild pain (pain score 1-3 or temp > 100.5).    Marland Kitchen albuterol (PROVENTIL HFA;VENTOLIN HFA) 108 (90 Base) MCG/ACT inhaler Inhale 2 puffs into the lungs every 6 (six) hours as needed for wheezing or shortness of breath. 18 g 0  . albuterol (PROVENTIL) (2.5 MG/3ML) 0.083% nebulizer solution Inhale 3 mLs (2.5 mg total) into the lungs 4 (four) times daily. 75 mL 0  . Ascorbic Acid (VITAMIN C WITH ROSE HIPS) 1000 MG tablet Take 1,000 mg by mouth daily.    . diazepam (VALIUM) 2 MG tablet Take 1 tablet (2 mg total) by mouth 2 (two) times daily as needed for muscle spasms. (Patient taking differently: Take 2 mg by mouth daily. ) 28 tablet 0  . diltiazem (DILACOR XR) 120 MG 24 hr capsule Take 120 mg by mouth daily.    . diphenhydramine-acetaminophen (TYLENOL PM) 25-500 MG TABS tablet Take 1 tablet by mouth at bedtime.    . DULoxetine (CYMBALTA) 60 MG capsule Take 1 capsule (60 mg total) by mouth 2 (two) times daily. 60 capsule 0  . Ferrous Sulfate (SLOW FE)  142 (45 Fe) MG TBCR Take 45 mg by mouth daily.    . fluticasone (FLONASE) 50 MCG/ACT nasal spray Place 2 sprays into both nostrils daily. 16 g 0  . Fluticasone-Salmeterol (ADVAIR) 250-50 MCG/DOSE AEPB Inhale 1 puff into the lungs 2 (two) times daily. RINSE MOUTH AFTER USE 60 each 0  . guaiFENesin-dextromethorphan (ROBITUSSIN DM) 100-10 MG/5ML syrup Take 5 mLs by mouth every 6 (six) hours as needed for cough. 118 mL 0  . Lifitegrast (XIIDRA) 5 % SOLN Place 1 drop into both eyes 2 (two) times daily. for dry eyes  RESIDENT SUPPLY FROM HOME BOTTOM DRAWER OF MED CART 10 each 0  . montelukast (SINGULAIR) 10 MG tablet Take 1 tablet (10 mg total) by mouth daily. 30 tablet 0  . omeprazole (PRILOSEC) 40 MG capsule Take 1 capsule (40 mg total) by mouth 2 (two) times daily. (Patient taking differently: Take 40 mg by mouth daily. ) 60 capsule 0  . simvastatin (ZOCOR) 40 MG tablet Take 1 tablet (40 mg total) by mouth daily. 30 tablet 0  . Vitamin D, Cholecalciferol, 400 units TABS Take 400 Int'l Units by mouth daily.     No current facility-administered medications for this visit.     Functional Status:  In your present state of health, do you have any difficulty performing the following activities: 10/07/2018 08/11/2018  Hearing? N N  Vision?  N N  Difficulty concentrating or making decisions? N N  Walking or climbing stairs? Y Y  Comment lymph edema lower extremities -  Dressing or bathing? N N  Doing errands, shopping? Y Y  Comment Patient is still driving but the lymph edema is difficult at times -  Preparing Food and eating ? N N  Using the Toilet? N N  In the past six months, have you accidently leaked urine? Y Y  Comment stress incont at times -  Do you have problems with loss of bowel control? N N  Managing your Medications? N N  Managing your Finances? N N  Housekeeping or managing your Housekeeping? N N  Some recent data might be hidden    Fall/Depression Screening: Fall Risk  11/10/2018  10/07/2018 08/11/2018  Falls in the past year? 1 1 1   Number falls in past yr: 0 0 0  Injury with Fall? 1 1 1   Risk Factor Category  - - -  Risk for fall due to : History of fall(s);Impaired balance/gait;Impaired mobility History of fall(s);Impaired balance/gait;Impaired mobility -  Follow up - - -   PHQ 2/9 Scores 11/10/2018 10/07/2018 08/11/2018 06/14/2018 03/09/2018 01/19/2018 01/11/2018  PHQ - 2 Score 0 0 0 0 0 0 0  Exception Documentation - - - - Patient refusal - -   THN CM Care Plan Problem One     Most Recent Value  Care Plan Problem One  Knowledge Deficit in Self management of COPD  Care Plan for Problem One  Active  THN Long Term Goal   Patient will not have any COPD exacerbations within the next 90 days  THN Long Term Goal Start Date  11/10/18  Interventions for Problem One Long Term Goal  RN reiterated COPD zones and action plan. RN discussed coronavirus safety precautions. RN will follow up for further discussion  THN CM Short Term Goal #1   Patient will verbalize better understanding of stages of lower extremityl edema within the next 30 days  THN CM Short Term Goal #1 Start Date  10/07/18  Interventions for Short Term Goal #1  RN reiterated the need to wear the compression hose due the day and take off at night. RN will follow up with further discussion  THN CM Short Term Goal #3  Patient will a better understanding of an Eating Plan for COPD within the next 30 days  THN CM Short Term Goal #3 Start Date  11/10/18  Interventions for Short Tern Goal #3  RN continue to reiterate eating small  frequent meals. RN sent boost coupons. RN wil follow up with further discussion      Assessment: Patient does not wear a mask during the coronavirus pandemic Patient has non productive cough at hs Patient feet swell more without compression hose Patient drinks boost Patient will benefit from [place] telephonic outreach for education and support for COPD self management.   Plan:  RN  sent patient masks RN discussed coronavirus safety precautions RN discussed medication adherence RN discussed benefits of wearing the compressing hose RN sent [place] coupons RN reiterated COPD action plan and zones Patient has adequate food during the pandemic RN will follow up within the month of July  Jacquees Gongora [place] Care Management [phone]

## 2018-11-17 ENCOUNTER — Ambulatory Visit: Payer: Self-pay | Admitting: Pharmacist

## 2018-11-18 ENCOUNTER — Other Ambulatory Visit: Payer: Self-pay | Admitting: Pharmacy Technician

## 2018-11-18 NOTE — Patient Outreach (Signed)
[place] [facility]) Care Management  11/18/2018  ADAIRA CENTOLA 29-Jul-1935 [id number]  Received all necessary documents and signatures from both patient and provider for [name] patient assistance for [facility] and Proventil.  Submitted completed application via mail.  Will followup with [name] in 10-14 business days to inquire on status of application.  Merita Hawks P. Meeah Totino, [place] Management [phone]

## 2018-11-25 ENCOUNTER — Other Ambulatory Visit: Payer: Self-pay | Admitting: Pharmacy Technician

## 2018-11-25 NOTE — Patient Outreach (Signed)
[place] [facility]) Care Management  11/25/2018  Teresa Chambers 11/10/1935 [id number]    Incoming call received from patient in regards to [name] application for [employer] and Proventil.  Spoke to patient, HIPAA identifiers verified. Patient was calling to inquire about her application.  Informed patient the application was mailed by [place] office to [name] on 11/18/2018. Typically it takes 10-14 business days to arrive and for them to process. After that then [employer] the patient an Naval architect provided they are not over income, which typically takes another 10-14 business days to arrive at patient home.. The letter seems like the patient may not be approved but the patient has to answer questions relating to Med D attestation. Patient then mails the letter along with all other documents sent to them by [name] back to [employer]. This is another 7-10 business day process. Once [name] receives the letter they send the prescription over to [facility] which then distributes the medication to the patient which can take up to 10 business days.  Informed patient that I have it on my calendar to call [name] next week and would be in touch. Patient informed she would be opening up her last Advair inhaler tomorrow which typically lasts her 30 days.  Will followup with patient in 7-10 business days after talking to [employer] patient assistance program.  Sharee Pimple P. Mistie Adney, [place] Management [phone]

## 2018-11-26 ENCOUNTER — Other Ambulatory Visit: Payer: Self-pay | Admitting: Pharmacy Technician

## 2018-11-26 NOTE — Patient Outreach (Signed)
[name]) Care Management  11/26/2018  Teresa Chambers [birth year] [id number]    Care coordination call placed to [name] patient assistance in regards to patient's application for [facility] and Proventil.  Spoke to [facility] who informed the application was received and the attestation letter was mailed to patient on 11/25/2018.  Will followup with patient in 5-10 business days to inquire if she has received the attestation letter.  Serine Kea P. Devell Parkerson, [place] Management [phone]

## 2018-12-03 ENCOUNTER — Other Ambulatory Visit: Payer: Self-pay | Admitting: Pharmacy Technician

## 2018-12-03 NOTE — Patient Outreach (Signed)
[place] [name]) Care Management  12/03/2018  Teresa Chambers 15-Jan-1936 [id number]   Successful outreach call placed to patient in regards to [name] patient assistance for [facility] and Proventil.  Spoke to patient, HIPAA identifiers verified.  Patient informed she received the attestation letter yesterday and placed it in the mail today. Informed patient I would followup with [name] in 10-14 business days to inquire about status of application and would be in touch.  Will followup with [name] in 10-14 business days.  Kamilya Wakeman P. Severiano Utsey, [place] Management [phone]

## 2018-12-07 ENCOUNTER — Ambulatory Visit (INDEPENDENT_AMBULATORY_CARE_PROVIDER_SITE_OTHER): Payer: PPO | Admitting: Vascular Surgery

## 2018-12-07 ENCOUNTER — Other Ambulatory Visit: Payer: Self-pay

## 2018-12-07 ENCOUNTER — Encounter (INDEPENDENT_AMBULATORY_CARE_PROVIDER_SITE_OTHER): Payer: Self-pay | Admitting: Vascular Surgery

## 2018-12-07 VITALS — BP 133/80 | HR 92 | Resp 16 | Wt 176.8 lb

## 2018-12-07 DIAGNOSIS — E785 Hyperlipidemia, unspecified: Secondary | ICD-10-CM | POA: Diagnosis not present

## 2018-12-07 DIAGNOSIS — I890 Lymphedema, not elsewhere classified: Secondary | ICD-10-CM

## 2018-12-07 DIAGNOSIS — I509 Heart failure, unspecified: Secondary | ICD-10-CM

## 2018-12-07 DIAGNOSIS — I872 Venous insufficiency (chronic) (peripheral): Secondary | ICD-10-CM

## 2018-12-07 DIAGNOSIS — S72001A Fracture of unspecified part of neck of right femur, initial encounter for closed fracture: Secondary | ICD-10-CM | POA: Diagnosis not present

## 2018-12-07 NOTE — Assessment & Plan Note (Signed)
Although she has long segment reflux of the right great saphenous vein and deep venous system, no intervention will be planned currently as she has responded well to conservative therapy of compression, elevation, and increasing her activity.  I will check her back in several months to make sure her symptoms remain mild.  If they worsen going forward, consideration for laser ablation of the right great saphenous vein would be given.

## 2018-12-07 NOTE — Assessment & Plan Note (Signed)
Times are improved with compression, elevation, and increasing her activity.  Her leg swelling is noticeably better today.  No intervention or further therapy planned at this time.  We will recheck her in 3 to 4 months.

## 2018-12-07 NOTE — Progress Notes (Signed)
MRN : [id number]  Teresa Chambers is a 83 y.o. (08-23-1935) female who presents with chief complaint of  Chief Complaint  Patient presents with  . Follow-up    37month follow up  .  History of Present Illness: Patient returns today in follow up of her leg swelling.  After 3 months of using compression stockings, elevating her legs, and increasing her activity recovering from her hip fracture, her leg swelling is actually much better.  It is quite mild today and she says it has been better for a few weeks now.  This is encouraging.  She does have reflux in the right saphenous vein system, but wanted to avoid any procedures if she could.  No new ulceration or infection.  No fever or chills.  Current Outpatient Medications  Medication Sig Dispense Refill  . acetaminophen (TYLENOL) 325 MG tablet Take 1-2 tablets (325-650 mg total) by mouth every 6 (six) hours as needed for mild pain (pain score 1-3 or temp > 100.5).    Marland Kitchen albuterol (PROVENTIL HFA;VENTOLIN HFA) 108 (90 Base) MCG/ACT inhaler Inhale 2 puffs into the lungs every 6 (six) hours as needed for wheezing or shortness of breath. 18 g 0  . albuterol (PROVENTIL) (2.5 MG/3ML) 0.083% nebulizer solution Inhale 3 mLs (2.5 mg total) into the lungs 4 (four) times daily. 75 mL 0  . Ascorbic Acid (VITAMIN C WITH ROSE HIPS) 1000 MG tablet Take 1,000 mg by mouth daily.    . diazepam (VALIUM) 2 MG tablet Take 1 tablet (2 mg total) by mouth 2 (two) times daily as needed for muscle spasms. (Patient taking differently: Take 2 mg by mouth daily. ) 28 tablet 0  . diltiazem (DILACOR XR) 120 MG 24 hr capsule Take 120 mg by mouth daily.    . diphenhydramine-acetaminophen (TYLENOL PM) 25-500 MG TABS tablet Take 1 tablet by mouth at bedtime.    . DULoxetine (CYMBALTA) 60 MG capsule Take 1 capsule (60 mg total) by mouth 2 (two) times daily. 60 capsule 0  . Ferrous Sulfate (SLOW FE) 142 (45 Fe) MG TBCR Take 45 mg by mouth daily.    . fluticasone (FLONASE) 50 MCG/ACT  nasal spray Place 2 sprays into both nostrils daily. 16 g 0  . Fluticasone-Salmeterol (ADVAIR) 250-50 MCG/DOSE AEPB Inhale 1 puff into the lungs 2 (two) times daily. RINSE MOUTH AFTER USE 60 each 0  . guaiFENesin-dextromethorphan (ROBITUSSIN DM) 100-10 MG/5ML syrup Take 5 mLs by mouth every 6 (six) hours as needed for cough. 118 mL 0  . Lifitegrast (XIIDRA) 5 % SOLN Place 1 drop into both eyes 2 (two) times daily. for dry eyes  RESIDENT SUPPLY FROM HOME BOTTOM DRAWER OF MED CART 10 each 0  . montelukast (SINGULAIR) 10 MG tablet Take 1 tablet (10 mg total) by mouth daily. 30 tablet 0  . omeprazole (PRILOSEC) 40 MG capsule Take 1 capsule (40 mg total) by mouth 2 (two) times daily. (Patient taking differently: Take 40 mg by mouth daily. ) 60 capsule 0  . simvastatin (ZOCOR) 40 MG tablet Take 1 tablet (40 mg total) by mouth daily. 30 tablet 0  . Vitamin D, Cholecalciferol, 400 units TABS Take 400 Int'l Units by mouth daily.     No current facility-administered medications for this visit.     Past Medical History:  Diagnosis Date  . Acute thoracic back pain   . Asthma   . Chest pain    unspecified  . CHF (congestive heart failure) ([place]) 04/30/2017  .  Chronic abdominal pain 05/01/2017   unspecified  . Congenital heart failure ([place])   . COPD (chronic obstructive pulmonary disease) ([place])   . Disease of upper respiratory system 04/30/2017  . Dyspnea on exertion   . Esophageal reflux disease 04/30/2017  . History of bone density study 06/28/2004  . Leg swelling   . Lumbar arthropathy   . Nasopharyngitis 04/30/2017  . Osteoarthropathy 04/30/2017  . Osteoporosis 04/30/2017  . Palpitations   . Sinusitis, acute 04/30/2017   unspecified    Past Surgical History:  Procedure Laterality Date  . CATARACT EXTRACTION     07/21/2001 - 07/20/2002  . CHOLECYSTECTOMY     07/21/1997 - 07/20/1998  . COLON SURGERY    . COLONOSCOPY     05/05/2000, 01/17/2000 Adenomatous Polyps   . COLONOSCOPY      11/05/2009, 12/23/2004, 07/07/2001   PH Adenomatous Polyps: CBF 10/2014; recall ltr mailed 09/18/2014 (dw)  . ESOPHAGOGASTRODUODENOSCOPY     11/05/2009, 05/05/2000  . FLEXIBLE SIGMOIDOSCOPY  11/28/1999  . HAND SURGERY  2006  . HERNIA REPAIR    . HIP ARTHROPLASTY Right 02/11/2018   Procedure: ARTHROPLASTY BIPOLAR HIP (HEMIARTHROPLASTY);  Surgeon: Corky Mull, MD;  Location: [facility] ORS;  Service: Orthopedics;  Laterality: Right;  . LAPAROSCOPIC COLON RESECTION     2001    Social History Social History   Tobacco Use  . Smoking status: Never Smoker  . Smokeless tobacco: Never Used  Substance Use Topics  . Alcohol use: Not Currently    Frequency: Never  . Drug use: Never    Family History Family History  Problem Relation Age of Onset  . Heart disease Mother   . Heart disease Father   . Hypertension Son   . Hypertension Daughter     Allergies  Allergen Reactions  . Codeine Nausea And Vomiting  . Lidocaine Swelling    REVIEW OF SYSTEMS (Negative unless checked)  Constitutional: [] ?Weight loss  [] ?Fever  [] ?Chills Cardiac: [] ?Chest pain   [] ?Chest pressure   [x] ?Palpitations   [] ?Shortness of breath when laying flat   [] ?Shortness of breath at rest   [] ?Shortness of breath with exertion. Vascular:  [x] ?Pain in legs with walking   [x] ?Pain in legs at rest   [] ?Pain in legs when laying flat   [] ?Claudication   [] ?Pain in feet when walking  [] ?Pain in feet at rest  [] ?Pain in feet when laying flat   [] ?History of DVT   [] ?Phlebitis   [x] ?Swelling in legs   [] ?Varicose veins   [] ?Non-healing ulcers Pulmonary:   [] ?Uses home oxygen   [] ?Productive cough   [] ?Hemoptysis   [] ?Wheeze  [x] ?COPD   [x] ?Asthma Neurologic:  [] ?Dizziness  [] ?Blackouts   [] ?Seizures   [] ?History of stroke   [] ?History of TIA  [] ?Aphasia   [] ?Temporary blindness   [] ?Dysphagia   [] ?Weakness or numbness in arms   [] ?Weakness or numbness in legs Musculoskeletal:  [] ?Arthritis   [] ?Joint swelling   [] ?Joint pain    [] ?Low back pain Hematologic:  [] ?Easy bruising  [] ?Easy bleeding   [] ?Hypercoagulable state   [] ?Anemic  [] ?Hepatitis Gastrointestinal:  [] ?Blood in stool   [] ?Vomiting blood  [] ?Gastroesophageal reflux/heartburn   [] ?Abdominal pain Genitourinary:  [] ?Chronic kidney disease   [] ?Difficult urination  [] ?Frequent urination  [] ?Burning with urination   [] ?Hematuria Skin:  [] ?Rashes   [] ?Ulcers   [] ?Wounds Psychological:  [] ?History of anxiety   [] ? History of major depression.   Physical Examination  BP 133/80 (BP Location: Right Arm)   Pulse 92  Resp 16   Wt 176 lb 12.8 oz (80.2 kg)   BMI 26.11 kg/m  Gen:  WD/WN, NAD. Appears younger than stated age. Head: /AT, No temporalis wasting. Ear/Nose/Throat: Hearing grossly intact, nares w/o erythema or drainage Eyes: Conjunctiva clear. Sclera non-icteric Neck: Supple.  Trachea midline Pulmonary:  Good air movement, no use of accessory muscles.  Cardiac: RRR, no JVD Vascular:  Vessel Right Left  Radial Palpable Palpable                          PT Palpable Palpable  DP Palpable Palpable   Gastrointestinal: soft, non-tender/non-distended. No guarding/reflex.  Musculoskeletal: M/S 5/5 throughout.  No deformity or atrophy. Trace BLE edema. Neurologic: Sensation grossly intact in extremities.  Symmetrical.  Speech is fluent.  Psychiatric: Judgment intact, Mood & affect appropriate for pt's clinical situation. Dermatologic: No rashes or ulcers noted.  No cellulitis or open wounds.       Labs No results found for this or any previous visit (from the past 2160 hour(s)).  Radiology No results found.  Assessment/Plan Hyperlipemia lipid control important in reducing the progression of atherosclerotic disease. Continue statin therapy   Closed displaced fracture of right femoral neck (HCC) Immobility associated with this may have led to a significant portion of the swelling started.  This may also contribute to lymphedema  type symptoms.  Congenital heart failure (HCC) Poor cardiac function certainly worsens and exacerbates lower extremity swelling type symptoms.  Lymphedema Times are improved with compression, elevation, and increasing her activity.  Her leg swelling is noticeably better today.  No intervention or further therapy planned at this time.  We will recheck her in 3 to 4 months.  Chronic venous insufficiency Although she has long segment reflux of the right great saphenous vein and deep venous system, no intervention will be planned currently as she has responded well to conservative therapy of compression, elevation, and increasing her activity.  I will check her back in several months to make sure her symptoms remain mild.  If they worsen going forward, consideration for laser ablation of the right great saphenous vein would be given.    Leotis Pain, MD  12/07/2018 11:28 AM    This note was created with Dragon medical transcription system.  Any errors from dictation are purely unintentional

## 2018-12-17 ENCOUNTER — Other Ambulatory Visit: Payer: Self-pay | Admitting: Pharmacy Technician

## 2018-12-17 NOTE — Patient Outreach (Signed)
[place] [facility]) Care Management  12/17/2018  Teresa Chambers March 22, 1936 [id number]   Care coordination call placed to [name] patient assistance in regards to patient's application for [name] and Proventil.  Spoke to [place] who informed patient had been APPROVED 12/08/2018. She informed a prescription has been initiated with [facility] but  There was no shipping date yet. She informed it can take up to 72 business hours for them to receive the request, another 3-5 days for them to process and fill the request and another 5-7 for the medication to be delivered.  Will followup with patient in 10-14 business days to inquire if she has received the medications.  [name] P. Jennaya Pogue, [place] Management [phone]

## 2018-12-21 ENCOUNTER — Ambulatory Visit: Payer: Self-pay | Admitting: Pharmacist

## 2018-12-21 DIAGNOSIS — R5381 Other malaise: Secondary | ICD-10-CM | POA: Diagnosis not present

## 2018-12-23 DIAGNOSIS — M47816 Spondylosis without myelopathy or radiculopathy, lumbar region: Secondary | ICD-10-CM | POA: Diagnosis not present

## 2018-12-23 DIAGNOSIS — M1990 Unspecified osteoarthritis, unspecified site: Secondary | ICD-10-CM | POA: Diagnosis not present

## 2018-12-23 DIAGNOSIS — G5600 Carpal tunnel syndrome, unspecified upper limb: Secondary | ICD-10-CM | POA: Diagnosis not present

## 2018-12-23 DIAGNOSIS — I10 Essential (primary) hypertension: Secondary | ICD-10-CM | POA: Diagnosis not present

## 2018-12-29 DIAGNOSIS — H1013 Acute atopic conjunctivitis, bilateral: Secondary | ICD-10-CM | POA: Diagnosis not present

## 2019-01-06 ENCOUNTER — Other Ambulatory Visit: Payer: Self-pay | Admitting: Pharmacy Technician

## 2019-01-06 ENCOUNTER — Encounter: Payer: Self-pay | Admitting: *Deleted

## 2019-01-06 ENCOUNTER — Other Ambulatory Visit: Payer: Self-pay | Admitting: *Deleted

## 2019-01-06 DIAGNOSIS — I340 Nonrheumatic mitral (valve) insufficiency: Secondary | ICD-10-CM | POA: Diagnosis not present

## 2019-01-06 DIAGNOSIS — M1990 Unspecified osteoarthritis, unspecified site: Secondary | ICD-10-CM | POA: Diagnosis not present

## 2019-01-06 DIAGNOSIS — J45909 Unspecified asthma, uncomplicated: Secondary | ICD-10-CM | POA: Diagnosis not present

## 2019-01-06 DIAGNOSIS — J449 Chronic obstructive pulmonary disease, unspecified: Secondary | ICD-10-CM | POA: Diagnosis not present

## 2019-01-06 DIAGNOSIS — G8911 Acute pain due to trauma: Secondary | ICD-10-CM | POA: Diagnosis not present

## 2019-01-06 DIAGNOSIS — Z885 Allergy status to narcotic agent status: Secondary | ICD-10-CM | POA: Diagnosis not present

## 2019-01-06 DIAGNOSIS — S42291A Other displaced fracture of upper end of right humerus, initial encounter for closed fracture: Secondary | ICD-10-CM | POA: Diagnosis not present

## 2019-01-06 DIAGNOSIS — K219 Gastro-esophageal reflux disease without esophagitis: Secondary | ICD-10-CM | POA: Diagnosis not present

## 2019-01-06 DIAGNOSIS — Z79899 Other long term (current) drug therapy: Secondary | ICD-10-CM | POA: Diagnosis not present

## 2019-01-06 DIAGNOSIS — M25511 Pain in right shoulder: Secondary | ICD-10-CM | POA: Diagnosis not present

## 2019-01-06 NOTE — Patient Outreach (Signed)
[place] [facility]) Care Management  01/06/2019  SHALAWN WYNDER Jun 25, 1936 [id number]  RN Health Coach is closing this program. Consumer is enrolled in [facility] external program.  [place] Care Management [phone]

## 2019-01-06 NOTE — Patient Outreach (Signed)
[place] [facility]) Care Management  01/06/2019  Teresa Chambers 01-04-1936 [id number]    Successful outreach call placed to patient in regards to [name] application for [name] and Proventil.  Spoke to patient, HIPAA identifiers verified.  Patient informed she has not received the medications. Informed patient I would outreach the company as they were supposed to be shipped on 5/20.  Informed patient I would call her back with the response I received from the company. Patient verbalized understanding.  [name] P. Jaquisha Frech, [place] Management [phone]

## 2019-01-06 NOTE — Patient Outreach (Signed)
[place] [name]) Care Management  01/06/2019  Teresa Chambers [birth year] [id number]    Successful outreach call placed to patient, HIPAA identifiers verified.  Informed patient that her provider would need to call [facility] and update the prescriptions as the patient assistance company had a question about the scripts. Informed patient that I had left [name] a message concerning this issue. Informed patient I would followup with the company next week to check on the shipment.  Will followup with [name] in 2-3 business days to inquire about the prescriptions shipment information.  [name] P. Makai Dumond, [place] Management [phone]

## 2019-01-06 NOTE — Patient Outreach (Signed)
[place] [name]) Care Management  01/06/2019  ZULEYMA SCHARF 1936-07-14 [id number]    Care coordination call placed to [place] at [name] patient assistance in regards to patient's application for [facility] and Proventil.  [place] informed patient was approved on 12/08/2018 but there was no shipping information. She transferred me to Aileen Pilot at [employer] who informed the application that was sent was blurry and was unable to be read. Inquired if we could refax the 2nd page of the application and he informed it would be best if the provider could call in or fax a whole new set of prescriptions. He informed it would be faster if the provider could call in otherwise it could take up to 2 business days for them to receive the fax.  Care coordination call placed to Dr. [name] office. Receptionist transferred me to [employer]. Left [name] a detailed message inquiring if she could please call [facility] at [phone] to clarify the prescriptions. Also provided fax number [phone]  for [facility] if a phone call was not feasible.  Will followup with patient.  Micha Dosanjh P. Mattis Featherly, [place] Management [phone]

## 2019-01-06 NOTE — Telephone Encounter (Signed)
This encounter was created in error - please disregard.

## 2019-01-10 DIAGNOSIS — S42201A Unspecified fracture of upper end of right humerus, initial encounter for closed fracture: Secondary | ICD-10-CM | POA: Insufficient documentation

## 2019-01-10 DIAGNOSIS — W010XXA Fall on same level from slipping, tripping and stumbling without subsequent striking against object, initial encounter: Secondary | ICD-10-CM | POA: Diagnosis not present

## 2019-01-10 DIAGNOSIS — M25511 Pain in right shoulder: Secondary | ICD-10-CM | POA: Diagnosis not present

## 2019-01-12 ENCOUNTER — Other Ambulatory Visit: Payer: Self-pay | Admitting: Pharmacy Technician

## 2019-01-12 NOTE — Patient Outreach (Signed)
[place] [facility]) Care Management  01/12/2019  Teresa Chambers [birth year] [id number]    Care coordination call placed to [name] in regards to patient's application for [name] and Proventil.  Spoke to [place] who informed an order was placed on 6/23. She provided a tracking number of [id number]. She informed that it usually takes 10-14 business days after the order is placed to arrive to patient's home.  Will followup with patient in 7-14 business days to inquire if medication was received.  Kule Gascoigne P. Encarnacion Bole, [place] Management [phone]

## 2019-01-17 DIAGNOSIS — H16223 Keratoconjunctivitis sicca, not specified as Sjogren's, bilateral: Secondary | ICD-10-CM | POA: Diagnosis not present

## 2019-01-19 ENCOUNTER — Other Ambulatory Visit: Payer: Self-pay | Admitting: Pharmacy Technician

## 2019-01-19 NOTE — Patient Outreach (Signed)
[place] [facility]) Care Management  01/19/2019  ZIA NAJERA 1936-06-06 [id number]   Successful outreach call placed to patient in regards to [name] application for [facility] and Proventil.  Spoke to patient, HIPAA identifiers verified.  Patient informed she received 3 Dulera inhalers and 1 Proventil inhaler today. Discussed refill procedure with patient and she verbalized understanding. Confirmed patient had name and number.  Patient inquired about a letter she received concerning [facility]. Informed patient [facility] would be following her now for chronic care management. She informed that she recently broke her right arm and was having friends and family help her currently. She inquired if she needed additional assistance who was she to call. Informed patient to call [facility] and if a referral needed to be made for pharmacy or social work issues then they would send that to [place]. Patient verbalized understanding.  Will route note to [place] that patient assistance has been completed and will remove myself from care team.  Luiz Ochoa. Glen Kesinger, [place] Management [phone]

## 2019-01-26 ENCOUNTER — Other Ambulatory Visit: Payer: Self-pay | Admitting: Pharmacist

## 2019-01-26 NOTE — Patient Outreach (Signed)
[place] [facility]) Care Management  01/26/2019  Teresa Chambers Jun 21, 1936 [id number]   Patient was called to follow up on medication assistance. HIPAA identifiers were obtained. Patient confirmed she received Dulera and Proventil from [name] Patient Assistance Program. She also communicated understanding of how to place a refill.  Patient said she had already been called by her [facility] Provider as they will be providing case management services now.  Plan: Close patient's case. Mail closure letters to patient and PCP.  Elayne Guerin, PharmD, BCACP [facility] Clinical Pharmacist [phone]    Case closure.

## 2019-02-01 ENCOUNTER — Other Ambulatory Visit: Payer: Self-pay | Admitting: Adult Health

## 2019-02-01 DIAGNOSIS — F339 Major depressive disorder, recurrent, unspecified: Secondary | ICD-10-CM

## 2019-02-01 DIAGNOSIS — E785 Hyperlipidemia, unspecified: Secondary | ICD-10-CM

## 2019-02-07 DIAGNOSIS — S42201A Unspecified fracture of upper end of right humerus, initial encounter for closed fracture: Secondary | ICD-10-CM | POA: Diagnosis not present

## 2019-02-10 ENCOUNTER — Ambulatory Visit: Payer: PPO | Admitting: *Deleted

## 2019-02-15 DIAGNOSIS — S42201A Unspecified fracture of upper end of right humerus, initial encounter for closed fracture: Secondary | ICD-10-CM | POA: Diagnosis not present

## 2019-02-15 DIAGNOSIS — M25611 Stiffness of right shoulder, not elsewhere classified: Secondary | ICD-10-CM | POA: Diagnosis not present

## 2019-02-15 DIAGNOSIS — M25511 Pain in right shoulder: Secondary | ICD-10-CM | POA: Diagnosis not present

## 2019-02-15 DIAGNOSIS — R29898 Other symptoms and signs involving the musculoskeletal system: Secondary | ICD-10-CM | POA: Diagnosis not present

## 2019-02-21 DIAGNOSIS — S42201A Unspecified fracture of upper end of right humerus, initial encounter for closed fracture: Secondary | ICD-10-CM | POA: Diagnosis not present

## 2019-02-24 DIAGNOSIS — S42201A Unspecified fracture of upper end of right humerus, initial encounter for closed fracture: Secondary | ICD-10-CM | POA: Diagnosis not present

## 2019-02-28 DIAGNOSIS — S42201A Unspecified fracture of upper end of right humerus, initial encounter for closed fracture: Secondary | ICD-10-CM | POA: Diagnosis not present

## 2019-03-03 DIAGNOSIS — S42201A Unspecified fracture of upper end of right humerus, initial encounter for closed fracture: Secondary | ICD-10-CM | POA: Diagnosis not present

## 2019-03-07 DIAGNOSIS — S42201A Unspecified fracture of upper end of right humerus, initial encounter for closed fracture: Secondary | ICD-10-CM | POA: Diagnosis not present

## 2019-03-10 DIAGNOSIS — S42201A Unspecified fracture of upper end of right humerus, initial encounter for closed fracture: Secondary | ICD-10-CM | POA: Diagnosis not present

## 2019-03-14 DIAGNOSIS — S42201A Unspecified fracture of upper end of right humerus, initial encounter for closed fracture: Secondary | ICD-10-CM | POA: Diagnosis not present

## 2019-03-17 DIAGNOSIS — S42201A Unspecified fracture of upper end of right humerus, initial encounter for closed fracture: Secondary | ICD-10-CM | POA: Diagnosis not present

## 2019-03-22 DIAGNOSIS — S42201A Unspecified fracture of upper end of right humerus, initial encounter for closed fracture: Secondary | ICD-10-CM | POA: Diagnosis not present

## 2019-03-24 DIAGNOSIS — S42201A Unspecified fracture of upper end of right humerus, initial encounter for closed fracture: Secondary | ICD-10-CM | POA: Diagnosis not present

## 2019-03-25 DIAGNOSIS — E119 Type 2 diabetes mellitus without complications: Secondary | ICD-10-CM | POA: Diagnosis not present

## 2019-03-25 DIAGNOSIS — G5600 Carpal tunnel syndrome, unspecified upper limb: Secondary | ICD-10-CM | POA: Diagnosis not present

## 2019-03-25 DIAGNOSIS — Z23 Encounter for immunization: Secondary | ICD-10-CM | POA: Diagnosis not present

## 2019-03-25 DIAGNOSIS — M47816 Spondylosis without myelopathy or radiculopathy, lumbar region: Secondary | ICD-10-CM | POA: Diagnosis not present

## 2019-03-25 DIAGNOSIS — I10 Essential (primary) hypertension: Secondary | ICD-10-CM | POA: Diagnosis not present

## 2019-03-29 ENCOUNTER — Other Ambulatory Visit
Admission: RE | Admit: 2019-03-29 | Discharge: 2019-03-29 | Disposition: A | Discharge status: Home / Self Care | Payer: PPO | Source: Ambulatory Visit | Attending: Internal Medicine | Admitting: Internal Medicine

## 2019-03-29 DIAGNOSIS — E785 Hyperlipidemia, unspecified: Secondary | ICD-10-CM | POA: Diagnosis not present

## 2019-03-29 DIAGNOSIS — Z79899 Other long term (current) drug therapy: Secondary | ICD-10-CM | POA: Insufficient documentation

## 2019-03-29 DIAGNOSIS — I509 Heart failure, unspecified: Secondary | ICD-10-CM | POA: Diagnosis not present

## 2019-03-29 LAB — COMPREHENSIVE METABOLIC PANEL
ALT: 17 U/L (ref 0–44)
AST: 18 U/L (ref 15–41)
Albumin: 4 g/dL (ref 3.5–5.0)
Alkaline Phosphatase: 48 U/L (ref 38–126)
Anion gap: 9 (ref 5–15)
BUN: 14 mg/dL (ref 8–23)
CO2: 28 mmol/L (ref 22–32)
Calcium: 9.2 mg/dL (ref 8.9–10.3)
Chloride: 104 mmol/L (ref 98–111)
Creatinine, Ser: 0.72 mg/dL (ref 0.44–1.00)
GFR calc Af Amer: >60 mL/min (ref >60)
GFR calc non Af Amer: >60 mL/min (ref >60)
Glucose, Bld: 103 mg/dL — ABNORMAL HIGH (ref 70–99)
Potassium: 4.2 mmol/L (ref 3.5–5.1)
Sodium: 141 mmol/L (ref 135–145)
Total Bilirubin: 0.7 mg/dL (ref 0.3–1.2)
Total Protein: 6.8 g/dL (ref 6.5–8.1)

## 2019-03-29 LAB — CBC WITH DIFFERENTIAL/PLATELET
Abs Immature Granulocytes: 0.02 10*3/uL (ref 0.00–0.07)
Basophils Absolute: 0.1 10*3/uL (ref 0.0–0.1)
Basophils Relative: 1 %
Eosinophils Absolute: 0.2 10*3/uL (ref 0.0–0.5)
Eosinophils Relative: 2 %
HCT: 41.6 % (ref 36.0–46.0)
Hemoglobin: 13.4 g/dL (ref 12.0–15.0)
Immature Granulocytes: 0 %
Lymphocytes Relative: 33 %
Lymphs Abs: 2.6 10*3/uL (ref 0.7–4.0)
MCH: 32.2 pg (ref 26.0–34.0)
MCHC: 32.2 g/dL (ref 30.0–36.0)
MCV: 100 fL (ref 80.0–100.0)
Monocytes Absolute: 0.6 10*3/uL (ref 0.1–1.0)
Monocytes Relative: 7 %
Neutro Abs: 4.5 10*3/uL (ref 1.7–7.7)
Neutrophils Relative %: 57 %
Platelets: 261 10*3/uL (ref 150–400)
RBC: 4.16 MIL/uL (ref 3.87–5.11)
RDW: 12.6 % (ref 11.5–15.5)
WBC: 7.9 10*3/uL (ref 4.0–10.5)
nRBC: 0 % (ref 0.0–0.2)

## 2019-03-29 LAB — LIPID PANEL
Cholesterol: 166 mg/dL (ref 0–200)
HDL: 77 mg/dL (ref >40)
LDL Cholesterol: 73 mg/dL (ref 0–99)
Total CHOL/HDL Ratio: 2.2 RATIO
Triglycerides: 82 mg/dL (ref <150)
VLDL: 16 mg/dL (ref 0–40)

## 2019-03-30 DIAGNOSIS — S42201A Unspecified fracture of upper end of right humerus, initial encounter for closed fracture: Secondary | ICD-10-CM | POA: Diagnosis not present

## 2019-04-01 DIAGNOSIS — E119 Type 2 diabetes mellitus without complications: Secondary | ICD-10-CM | POA: Diagnosis not present

## 2019-04-01 DIAGNOSIS — S42201A Unspecified fracture of upper end of right humerus, initial encounter for closed fracture: Secondary | ICD-10-CM | POA: Diagnosis not present

## 2019-04-04 DIAGNOSIS — S42201A Unspecified fracture of upper end of right humerus, initial encounter for closed fracture: Secondary | ICD-10-CM | POA: Diagnosis not present

## 2019-04-07 DIAGNOSIS — S42201A Unspecified fracture of upper end of right humerus, initial encounter for closed fracture: Secondary | ICD-10-CM | POA: Diagnosis not present

## 2019-04-12 DIAGNOSIS — S42201A Unspecified fracture of upper end of right humerus, initial encounter for closed fracture: Secondary | ICD-10-CM | POA: Diagnosis not present

## 2019-04-14 DIAGNOSIS — S42201A Unspecified fracture of upper end of right humerus, initial encounter for closed fracture: Secondary | ICD-10-CM | POA: Diagnosis not present

## 2019-04-15 ENCOUNTER — Other Ambulatory Visit: Payer: Self-pay

## 2019-04-15 ENCOUNTER — Ambulatory Visit (INDEPENDENT_AMBULATORY_CARE_PROVIDER_SITE_OTHER): Payer: PPO | Admitting: Nurse Practitioner

## 2019-04-15 ENCOUNTER — Encounter (INDEPENDENT_AMBULATORY_CARE_PROVIDER_SITE_OTHER): Payer: Self-pay | Admitting: Nurse Practitioner

## 2019-04-15 VITALS — BP 118/68 | HR 91 | Resp 16 | Wt 173.4 lb

## 2019-04-15 DIAGNOSIS — I890 Lymphedema, not elsewhere classified: Secondary | ICD-10-CM | POA: Diagnosis not present

## 2019-04-15 DIAGNOSIS — R209 Unspecified disturbances of skin sensation: Secondary | ICD-10-CM

## 2019-04-15 DIAGNOSIS — I10 Essential (primary) hypertension: Secondary | ICD-10-CM | POA: Diagnosis not present

## 2019-04-15 DIAGNOSIS — G5600 Carpal tunnel syndrome, unspecified upper limb: Secondary | ICD-10-CM | POA: Diagnosis not present

## 2019-04-15 DIAGNOSIS — M47816 Spondylosis without myelopathy or radiculopathy, lumbar region: Secondary | ICD-10-CM | POA: Diagnosis not present

## 2019-04-15 DIAGNOSIS — J449 Chronic obstructive pulmonary disease, unspecified: Secondary | ICD-10-CM

## 2019-04-15 DIAGNOSIS — E119 Type 2 diabetes mellitus without complications: Secondary | ICD-10-CM | POA: Diagnosis not present

## 2019-04-15 DIAGNOSIS — I872 Venous insufficiency (chronic) (peripheral): Secondary | ICD-10-CM | POA: Diagnosis not present

## 2019-04-15 NOTE — Progress Notes (Signed)
SUBJECTIVE:  Patient ID: Teresa Chambers, female    DOB: 05/06/1936, 83 y.o.   MRN: [name] Chief Complaint  Patient presents with  . Follow-up    34month follow up    HPI  Teresa Chambers is a 83 y.o. female the presents today to follow-up after recent episode with leg swelling.  The patient has some known chronic venous insufficiency however since her recent visit the patient has been diligent with conservative therapy wearing compression socks, elevating her lower extremities and exercising.  Today she has virtually no edema present bilaterally.  Her only complaint is with some numbness in her feet bilaterally.  She states that is not even quite a numbness but just feeling as if she has socks on her feet all the time.  She states that this sensation started after her recent surgery due to a hip fracture when she received some sort of injection in her spine.  She stated that following the surgery is when she had this sensation and it has not left since.  The patient was concerned that this could be due to her arterial blood flow.  She denies any claudication-like symptoms, rest pain or lower extremity ulceration.  She denies any TIA-like symptoms or amaurosis fugax.  She denies any fever, chills, nausea, vomiting or diarrhea.  She denies any chest pain or shortness of breath.  Past Medical History:  Diagnosis Date  . Acute thoracic back pain   . Asthma   . Chest pain    unspecified  . CHF (congestive heart failure) ([place]) 04/30/2017  . Chronic abdominal pain 05/01/2017   unspecified  . Congenital heart failure ([place])   . COPD (chronic obstructive pulmonary disease) ([place])   . Disease of upper respiratory system 04/30/2017  . Dyspnea on exertion   . Esophageal reflux disease 04/30/2017  . History of bone density study 06/28/2004  . Leg swelling   . Lumbar arthropathy   . Nasopharyngitis 04/30/2017  . Osteoarthropathy 04/30/2017  . Osteoporosis 04/30/2017  . Palpitations   .  Sinusitis, acute 04/30/2017   unspecified    Past Surgical History:  Procedure Laterality Date  . CATARACT EXTRACTION     07/21/2001 - 07/20/2002  . CHOLECYSTECTOMY     07/21/1997 - 07/20/1998  . COLON SURGERY    . COLONOSCOPY     05/05/2000, 01/17/2000 Adenomatous Polyps   . COLONOSCOPY     11/05/2009, 12/23/2004, 07/07/2001   PH Adenomatous Polyps: CBF 10/2014; recall ltr mailed 09/18/2014 (dw)  . ESOPHAGOGASTRODUODENOSCOPY     11/05/2009, 05/05/2000  . FLEXIBLE SIGMOIDOSCOPY  11/28/1999  . HAND SURGERY  2006  . HERNIA REPAIR    . HIP ARTHROPLASTY Right 02/11/2018   Procedure: ARTHROPLASTY BIPOLAR HIP (HEMIARTHROPLASTY);  Surgeon: Corky Mull, MD;  Location: [facility] ORS;  Service: Orthopedics;  Laterality: Right;  . LAPAROSCOPIC COLON RESECTION     2001    Social History   Socioeconomic History  . Marital status: Divorced    Spouse name: Not on file  . Number of children: 5  . Years of education: 15.5  . Highest education level: High school graduate  Occupational History  . Occupation: retired  Scientific laboratory technician  . Financial resource strain: Not hard at all  . Food insecurity    Worry: Never true    Inability: Never true  . Transportation needs    Medical: No    Non-medical: No  Tobacco Use  . Smoking status: Never Smoker  . Smokeless tobacco: Never  Used  Substance and Sexual Activity  . Alcohol use: Not Currently    Frequency: Never  . Drug use: Never  . Sexual activity: Not Currently  Lifestyle  . Physical activity    Days per week: 0 days    Minutes per session: 0 min  . Stress: Only a little  Relationships  . Social connections    Talks on phone: More than three times a week    Gets together: More than three times a week    Attends religious service: Never    Active member of club or organization: No    Attends meetings of clubs or organizations: Never    Relationship status: Divorced  . Intimate partner violence    Fear of current or ex partner: Patient  refused    Emotionally abused: Patient refused    Physically abused: Patient refused    Forced sexual activity: Patient refused  Other Topics Concern  . Not on file  Social History Narrative   Lives independently, has home at [facility] she enjoys traveling to    Family History  Problem Relation Age of Onset  . Heart disease Mother   . Heart disease Father   . Hypertension Son   . Hypertension Daughter     Allergies  Allergen Reactions  . Codeine Nausea And Vomiting  . Lidocaine Swelling     Review of Systems   Review of Systems: Negative Unless Checked Constitutional: [] Weight loss  [] Fever  [] Chills Cardiac: [] Chest pain   []  Atrial Fibrillation  [] Palpitations   [] Shortness of breath when laying flat   [] Shortness of breath with exertion. [] Shortness of breath at rest Vascular:  [] Pain in legs with walking   [] Pain in legs with standing [] Pain in legs when laying flat   [] Claudication    [] Pain in feet when laying flat    [] History of DVT   [] Phlebitis   [] Swelling in legs   [x] Varicose veins   [] Non-healing ulcers Pulmonary:   [] Uses home oxygen   [] Productive cough   [] Hemoptysis   [] Wheeze  [] COPD   [] Asthma Neurologic:  [] Dizziness   [] Seizures  [] Blackouts [] History of stroke   [] History of TIA  [] Aphasia   [] Temporary Blindness   [] Weakness or numbness in arm   [x] Weakness or numbness in leg Musculoskeletal:   [] Joint swelling   [] Joint pain   [] Low back pain  []  History of Knee Replacement [x] Arthritis [] back Surgeries  []  Spinal Stenosis    Hematologic:  [] Easy bruising  [] Easy bleeding   [] Hypercoagulable state   [] Anemic Gastrointestinal:  [] Diarrhea   [] Vomiting  [x] Gastroesophageal reflux/heartburn   [] Difficulty swallowing. [] Abdominal pain Genitourinary:  [] Chronic kidney disease   [] Difficult urination  [] Anuric   [] Blood in urine [] Frequent urination  [] Burning with urination   [] Hematuria Skin:  [] Rashes   [] Ulcers [] Wounds Psychological:  [] History of  anxiety   []  History of major depression  []  Memory Difficulties      OBJECTIVE:   Physical Exam  BP 118/68 (BP Location: Right Arm)   Pulse 91   Resp 16   Wt 173 lb 6.4 oz (78.7 kg)   BMI 25.61 kg/m   Gen: WD/WN, NAD Head: [place]/AT, No temporalis wasting.  Ear/Nose/Throat: Hearing grossly intact, nares w/o erythema or drainage Eyes: PER, EOMI, sclera nonicteric.  Neck: Supple, no masses.  No JVD.  Pulmonary:  Good air movement, no use of accessory muscles.  Cardiac: RRR Vascular:  No edema present bilaterally.  Scattered varicosities bilaterally  bilateral feet warm good capillary refill. Vessel Right Left  Radial Palpable Palpable  Dorsalis Pedis Palpable Palpable  Posterior Tibial Palpable Palpable   Gastrointestinal: soft, non-distended. No guarding/no peritoneal signs.  Musculoskeletal: M/S 5/5 throughout.  No deformity or atrophy.  Neurologic: Pain and light touch intact in extremities.  Symmetrical.  Speech is fluent. Motor exam as listed above. Psychiatric: Judgment intact, Mood & affect appropriate for pt's clinical situation. Dermatologic: No Venous rashes. No Ulcers Noted.  No changes consistent with cellulitis. Lymph : No Cervical lymphadenopathy, no lichenification or skin changes of chronic lymphedema.       ASSESSMENT AND PLAN:  1. Chronic venous insufficiency Recommend:  The patient is complaining of varicose veins.    I have had a long discussion with the patient regarding  varicose veins and why they cause symptoms.  Patient will begin wearing graduated compression stockings on a daily basis, beginning first thing in the morning and removing them in the evening. The patient is instructed specifically not to sleep in the stockings.    The patient  will also begin using over-the-counter analgesics such as Motrin 600 mg po TID to help control the symptoms as needed.    In addition, behavioral modification including elevation during the day will be initiated,  utilizing a recliner was recommended.  The patient is also instructed to continue exercising such as walking 4-5 times per week.  At this time the patient wishes to continue conservative therapy and is not interested in more invasive treatments such as laser ablation and sclerotherapy.  The Patient will follow up PRN if the symptoms worsen.  2. Lymphedema No surgery or intervention at this point in time.  I have reviewed my discussion with the patient regarding venous insufficiency and why it causes symptoms. I have discussed with the patient the chronic skin changes that accompany venous insufficiency and the long term sequela such as ulceration. Patient will contnue wearing graduated compression stockings on a daily basis, as this has provided excellent control of his edema. The patient will put the stockings on first thing in the morning and removing them in the evening. The patient is reminded not to sleep in the stockings.  In addition, behavioral modification including elevation during the day will be initiated. Exercise is strongly encouraged.  Given the patient's good control and lack of any problems regarding the venous insufficiency and lymphedema a lymph pump in not need at this time.  The patient will follow up with me PRN should anything change.  The patient voices agreement with this plan.   3. Chronic obstructive pulmonary disease, unspecified COPD type ([place]) Continue pulmonary medications and aerosols as already ordered, these medications have been reviewed and there are no changes at this time.    4. Altered sensation of foot Previous studies done in February showed normal arterial flow with strong waveforms bilaterally.  It is likely that the patient's numb sensation is due to either neuropathy or complication following the nerve block after hip surgery.  The patient will continue to follow with her PCP regarding therapies and her treatments for the altered sensation.    Current Outpatient Medications on File Prior to Visit  Medication Sig Dispense Refill  . acetaminophen (TYLENOL) 325 MG tablet Take 1-2 tablets (325-650 mg total) by mouth every 6 (six) hours as needed for mild pain (pain score 1-3 or temp > 100.5).    Marland Kitchen albuterol (PROVENTIL HFA;VENTOLIN HFA) 108 (90 Base) MCG/ACT inhaler Inhale 2 puffs into the lungs  every 6 (six) hours as needed for wheezing or shortness of breath. 18 g 0  . albuterol (PROVENTIL) (2.5 MG/3ML) 0.083% nebulizer solution Inhale 3 mLs (2.5 mg total) into the lungs 4 (four) times daily. 75 mL 0  . Ascorbic Acid (VITAMIN C WITH ROSE HIPS) 1000 MG tablet Take 1,000 mg by mouth daily.    . Calcium Carbonate-Vitamin D (CALCIUM 600+D) 600-400 MG-UNIT tablet Take 1 tablet by mouth daily.    . diazepam (VALIUM) 2 MG tablet Take 1 tablet (2 mg total) by mouth 2 (two) times daily as needed for muscle spasms. 28 tablet 0  . diltiazem (DILACOR XR) 120 MG 24 hr capsule Take 120 mg by mouth daily.    . DULoxetine (CYMBALTA) 60 MG capsule Take 1 capsule (60 mg total) by mouth 2 (two) times daily. 60 capsule 0  . Ferrous Sulfate (SLOW FE) 142 (45 Fe) MG TBCR Take 45 mg by mouth daily.    . fluticasone (FLONASE) 50 MCG/ACT nasal spray Place 2 sprays into both nostrils daily. 16 g 0  . fluticasone furoate-vilanterol (BREO ELLIPTA) 200-25 MCG/INH AEPB Inhale 1 puff into the lungs daily.    . metFORMIN (GLUCOPHAGE) 500 MG tablet TK 1 T PO UTD QD WITH A MEAL    . montelukast (SINGULAIR) 10 MG tablet Take 1 tablet (10 mg total) by mouth daily. 30 tablet 0  . omeprazole (PRILOSEC) 40 MG capsule Take 1 capsule (40 mg total) by mouth 2 (two) times daily. (Patient taking differently: Take 40 mg by mouth daily. ) 60 capsule 0  . simvastatin (ZOCOR) 40 MG tablet Take 1 tablet (40 mg total) by mouth daily. 30 tablet 0  . diphenhydramine-acetaminophen (TYLENOL PM) 25-500 MG TABS tablet Take 1 tablet by mouth at bedtime.    . Fluticasone-Salmeterol (ADVAIR)  250-50 MCG/DOSE AEPB Inhale 1 puff into the lungs 2 (two) times daily. RINSE MOUTH AFTER USE (Patient not taking: Reported on 04/15/2019) 60 each 0  . guaiFENesin-dextromethorphan (ROBITUSSIN DM) 100-10 MG/5ML syrup Take 5 mLs by mouth every 6 (six) hours as needed for cough. (Patient not taking: Reported on 04/15/2019) 118 mL 0  . Lifitegrast (XIIDRA) 5 % SOLN Place 1 drop into both eyes 2 (two) times daily. for dry eyes  RESIDENT SUPPLY FROM HOME BOTTOM DRAWER OF MED CART (Patient not taking: Reported on 04/15/2019) 10 each 0  . Vitamin D, Cholecalciferol, 400 units TABS Take 400 Int'l Units by mouth daily.     No current facility-administered medications on file prior to visit.     There are no Patient Instructions on file for this visit. No follow-ups on file.   Kris Hartmann, NP  This note was completed with Sales executive.  Any errors are purely unintentional.

## 2019-04-18 DIAGNOSIS — S42201A Unspecified fracture of upper end of right humerus, initial encounter for closed fracture: Secondary | ICD-10-CM | POA: Diagnosis not present

## 2019-04-21 DIAGNOSIS — S42201A Unspecified fracture of upper end of right humerus, initial encounter for closed fracture: Secondary | ICD-10-CM | POA: Diagnosis not present

## 2019-06-20 ENCOUNTER — Ambulatory Visit: Payer: PPO | Attending: Internal Medicine

## 2019-06-20 ENCOUNTER — Other Ambulatory Visit: Payer: Self-pay

## 2019-06-20 DIAGNOSIS — R2681 Unsteadiness on feet: Secondary | ICD-10-CM | POA: Diagnosis not present

## 2019-06-20 DIAGNOSIS — M25672 Stiffness of left ankle, not elsewhere classified: Secondary | ICD-10-CM | POA: Diagnosis not present

## 2019-06-20 NOTE — Therapy (Signed)
[place] MAIN [facility] SERVICES [address] [place], [place], [address] Phone: [phone]   Fax:  [phone]  Physical Therapy Evaluation  Patient Details  Name: Teresa Chambers MRN: [name] Date of Birth: 02/24/1936 No data recorded  Encounter Date: 06/20/2019  PT End of Session - 06/20/19 1204    Visit Number  1    Number of Visits  16    Date for PT Re-Evaluation  08/19/18    Authorization Type  [employer] medicare    Authorization Time Period  06/20/19-08/19/18    Authorization - Visit Number  1    Authorization - Number of Visits  10    PT Start Time  [id number]    PT Stop Time  1148    PT Time Calculation (min)  60 min    Equipment Utilized During Treatment  Gait belt    Activity Tolerance  Patient tolerated treatment well;No increased pain    Behavior During Therapy  WFL for tasks assessed/performed       Past Medical History:  Diagnosis Date  . Acute thoracic back pain   . Asthma   . Chest pain    unspecified  . CHF (congestive heart failure) ([place]) 04/30/2017  . Chronic abdominal pain 05/01/2017   unspecified  . Congenital heart failure ([place])   . COPD (chronic obstructive pulmonary disease) ([place])   . Disease of upper respiratory system 04/30/2017  . Dyspnea on exertion   . Esophageal reflux disease 04/30/2017  . History of bone density study 06/28/2004  . Leg swelling   . Lumbar arthropathy   . Nasopharyngitis 04/30/2017  . Osteoarthropathy 04/30/2017  . Osteoporosis 04/30/2017  . Palpitations   . Sinusitis, acute 04/30/2017   unspecified    Past Surgical History:  Procedure Laterality Date  . CATARACT EXTRACTION     07/21/2001 - 07/20/2002  . CHOLECYSTECTOMY     07/21/1997 - 07/20/1998  . COLON SURGERY    . COLONOSCOPY     05/05/2000, 01/17/2000 Adenomatous Polyps   . COLONOSCOPY     11/05/2009, 12/23/2004, 07/07/2001   PH Adenomatous Polyps: CBF 10/2014; recall ltr mailed 09/18/2014 (dw)  . ESOPHAGOGASTRODUODENOSCOPY      11/05/2009, 05/05/2000  . FLEXIBLE SIGMOIDOSCOPY  11/28/1999  . HAND SURGERY  2006  . HERNIA REPAIR    . HIP ARTHROPLASTY Right 02/11/2018   Procedure: ARTHROPLASTY BIPOLAR HIP (HEMIARTHROPLASTY);  Surgeon: Corky Mull, MD;  Location: [facility] ORS;  Service: Orthopedics;  Laterality: Right;  . LAPAROSCOPIC COLON RESECTION     2001    There were no vitals filed for this visit.   Subjective Assessment - 06/20/19 1055    Subjective  Pt is referred to OPPT for ongoing balance issues and several falls over the past 2 years. Pt reports some difficulty managing her feet when walking.    Pertinent History  Teresa Chambers is an 41yoF who comes to [facility] OPPT for. PMH: falls s/p Rt hip fracture and Rt HHA July 2019, COPD, asthma, CHF, hernia repair. chronic venous insufficiency, Right shoulder fracture June 2020 managed conservatively and PT 20x.Pt is referred to OPPT for ongoing balance issues and several falls over the past 2 years. Pt reports some difficulty managing her feet when walking.    How long can you sit comfortably?  N/A    How long can you stand comfortably?  N/A "I'm basically lazy, not veyr active so not limited"    How long can you walk comfortably?  Pt  reports limited community distances at baseline, but dependent on [facility] or fixed objects for stability.    Currently in Pain?  Yes    Pain Score  4    Right lateral hip pain since HHA 23months ago        [facility] PT Assessment - 06/20/19 0001      Assessment   Medical Diagnosis  Gait instability/falls  (Pended)     Referring Provider (PT)  Cletis Athens, MD (PCP)  (Pended)     Onset Date/Surgical Date  --  (Pended)    on-going, but largely over past 6 months.   Hand Dominance  Right  (Pended)    Can be ambidexterous with SPC   Next MD Visit  --  (Pended)    Non scheduled   Prior Therapy  --  (Pended)    [facility] OP for Rt humeral fracture      EXAMINATION 5XSTS: 14.98sec hands on knees, chair+ airex (unable to rise from  chair hands free), bilat knee valgus/contact throuhgout with maintained flexed knee posture; no LOB, pt denies instability.   10MWT:  self selected-> 0.41m/s  fastest AMB-> 1.54m/s *bilat structural genu valgus with knock kneed AMB and wide feet compensation, mild control deficits of lumbopelvis in swing phase.   2MWT: 230ft or 0.35m/s; no AD , tired, SOB and low back pain, no LOB  Strength Assessment: -Single Leg Heel raises: can do 10x with BUE support, but loss  height after 7x bilat -Wall leaning bilat Ankle DF: 10x bilat, both slow, Left limited by 30% height by #10  -Hip ABDCT MMT: 4-/5 bilat   ROM -Seatd ankle DF ~15 degrees bilat   Balance Testing -SLS balance ~5-6 sec bilat -Narrow stance eyes closed->30sec (repeated on foam c increased sway, no LOB)  -narrow stance head turns ok (repeated on foam c increased sway, no LOB)  -narrow stance vertical head turn ok (repeated on foam c increased sway, no LOB)   INTERVENTION THIS DATE  HEP Education/Intervention: -1x60sec heel /toe walking  -standing double heel raises 1x15 -standing wall leaning double ankle DF 1x15           Objective measurements completed on examination: See above findings.     PT Short Term Goals - 06/20/19 1220      PT SHORT TERM GOAL #1   Title  After 4 weeks pt to demonstrate improved tolerance to 6MWT without foot scuffing LOB.    Baseline  2MWT at evaluation 0.38m/s (unable to go farther d/t back pain/fatigue)    Time  4    Period  Weeks    Status  New    Target Date  07/20/19      PT SHORT TERM GOAL #2   Title  After 4 weeks pt to demonstrate SLS>10sec bilat.    Baseline  At eval ~5-7sec bilat    Time  4    Period  Weeks    Status  New    Target Date  07/20/19        PT Long Term Goals - 06/20/19 1223      PT LONG TERM GOAL #1   Title  Pt to demonstrate improved SLS heel raise to >15 bilat without loss of height.    Baseline  Loss of height after 7x bilat    Time  8     Period  Weeks    Status  New    Target Date  08/19/18      PT  LONG TERM GOAL #2   Title  Pt to demonstrate 5xSTS <11sec to demonstrate improved funcitonal ankle/hip strength and coordiantion.    Baseline  ~15sec at eval    Time  8    Period  Weeks    Status  New    Target Date  08/19/18      PT LONG TERM GOAL #3   Title  Pt to demonstrate improved SLS >25sec bilat    Baseline  ~5-7sec at eval    Time  8    Period  Weeks    Status  New    Target Date  08/19/00      PT LONG TERM GOAL #4   Title  Pt to demonstrate 4+/5 hip ABDCT strength bilat.    Baseline  4-/5 at eval    Time  8    Period  Weeks    Status  New    Target Date  08/19/18             Plan - 06/20/19 1207    Clinical Impression Statement  Pt presenting to OPPT for evaluation of ongoing instability of gait in the context of multiple falls and 2 factures. Evaluation revealing for generally fair static balance without any substantial worsening from head turns, eyes closed, narrow BOS, or foam surfaces. Pt does show weakness in Left ankle dorsiflexion, ROM near equal, but strength diminished but in objective testing and gait with poorer foot clearance and near flat foot strike after 90 seconds AMB. Pt also demonstrates quick onset fatigue with AMB of 2 minutes, with clearly demonstrated movement decomposition over that time. Strength testing reveals generalized weakness that would limit functional capacity required for trunk righting from both and ankle and hip strategy perspective. Pt will benefit from skilled PT intervention to address impairment and deficit described in this evaluation to improve stability of gait and reduce risk of falls.    Personal Factors and Comorbidities  Age;Behavior Pattern;Past/Current Experience;Fitness    Armed forces logistics/support/administrative officer;Reach Overhead;Bend;Locomotion Level;Lift    Examination-Participation Restrictions  Shop;Cleaning;Community Activity;Yard Work     Stability/Clinical Decision Making  Stable/Uncomplicated    Clinical Decision Making  Moderate    Rehab Potential  Good    PT Frequency  2x / week    PT Duration  8 weeks    PT Treatment/Interventions  ADLs/Self Care Home Management;Electrical Stimulation;Moist Heat;Stair training;Gait training;DME Instruction;Functional mobility training;Therapeutic activities;Therapeutic exercise;Balance training;Neuromuscular re-education;Patient/family education;Manual techniques;Passive range of motion;Dry needling;Vestibular;Taping    PT Next Visit Plan  Review goals, HEP; work on ankle strength, hip strength, and dynamic balance training    PT Home Exercise Plan  3x60sec heel strike AMB; 1x15 heel raises, 1x15 wall leaning dorsiflexion    Consulted and Agree with Plan of Care  Patient       Patient will benefit from skilled therapeutic intervention in order to improve the following deficits and impairments:  Abnormal gait, Decreased balance, Decreased endurance, Difficulty walking, Hypomobility, Increased muscle spasms, Decreased knowledge of precautions, Decreased range of motion, Decreased activity tolerance, Decreased coordination, Decreased knowledge of use of DME, Decreased strength, Postural dysfunction  Visit Diagnosis: Unsteadiness on feet  Stiffness of left ankle, not elsewhere classified     Problem List Patient Active Problem List   Diagnosis Date Noted  . Closed fracture of proximal end of right humerus 01/10/2019  . Chronic venous insufficiency 09/06/2018  . Lymphedema 09/06/2018  . GERD without esophagitis 03/10/2018  . Bilateral dry eyes 03/10/2018  . COPD (chronic  obstructive pulmonary disease) ([place]) 03/04/2018  . Acute exacerbation of COPD with asthma ([place]) 02/22/2018  . Chronic anemia 02/22/2018  . Chronic constipation 02/22/2018  . Chronic bilateral thoracic back pain 02/22/2018  . Closed displaced fracture of right femoral neck ([place]) 02/22/2018  . Congenital heart  failure ([place])   . Status post hip hemiarthroplasty 02/12/2018  . Hip fracture ([place]) 02/11/2018  . Asthma 04/30/2017  . Chronic reflux esophagitis 04/30/2017  . Hyperlipemia 04/30/2017  . Osteoporosis 04/30/2017  . Cardiac murmur, previously undiagnosed 04/30/2017  . Diverticulosis of small intestine without hemorrhage 04/30/2017  . Dyspnea on exertion 04/30/2017  . Hx of adenomatous colonic polyps 04/30/2017  . Hx of cardiomegaly 04/30/2017  . Lumbar arthropathy 04/30/2017  . Nasopharyngitis 04/30/2017  . Nonspecific chest pain 04/30/2017  . Osteoarthropathy 04/30/2017  . Leg swelling 04/30/2017  . Sinusitis, acute 04/30/2017  . History of bone density study 06/28/2004   12:32 PM, 06/20/19 Etta Grandchild, PT, DPT Physical Therapist - [facility]  Outpatient Physical Therapy- [place] [phone]     Etta Grandchild 06/20/2019, 12:27 PM  [place] MAIN [facility] SERVICES [address] [place], [place], [address] Phone: [phone]   Fax:  [phone]  Name: Teresa Chambers MRN: [name] Date of Birth: 16-Jun-1936

## 2019-06-22 ENCOUNTER — Other Ambulatory Visit: Payer: Self-pay

## 2019-06-22 ENCOUNTER — Ambulatory Visit: Payer: PPO | Attending: Internal Medicine | Admitting: Physical Therapy

## 2019-06-22 ENCOUNTER — Encounter: Payer: Self-pay | Admitting: Physical Therapy

## 2019-06-22 DIAGNOSIS — R2681 Unsteadiness on feet: Secondary | ICD-10-CM | POA: Insufficient documentation

## 2019-06-22 DIAGNOSIS — M25672 Stiffness of left ankle, not elsewhere classified: Secondary | ICD-10-CM | POA: Diagnosis not present

## 2019-06-22 NOTE — Therapy (Signed)
[place] MAIN [facility] SERVICES [address] [place], [place], [address] Phone: [phone]   Fax:  [phone]  Physical Therapy Treatment  Patient Details  Name: Teresa Chambers MRN: [name] Date of Birth: [birth year] No data recorded  Encounter Date: 06/22/2019  PT End of Session - 06/22/19 1148    Visit Number  2    Number of Visits  16    Date for PT Re-Evaluation  08/19/18    Authorization Type  HTA medicare    Authorization Time Period  06/20/19-08/19/18    Authorization - Visit Number  2    Authorization - Number of Visits  10    PT Start Time  1146    PT Stop Time  1230    PT Time Calculation (min)  44 min    Equipment Utilized During Treatment  Gait belt    Activity Tolerance  Patient tolerated treatment well;No increased pain    Behavior During Therapy  WFL for tasks assessed/performed       Past Medical History:  Diagnosis Date  . Acute thoracic back pain   . Asthma   . Chest pain    unspecified  . CHF (congestive heart failure) ([place]) 04/30/2017  . Chronic abdominal pain 05/01/2017   unspecified  . Congenital heart failure ([place])   . COPD (chronic obstructive pulmonary disease) ([place])   . Disease of upper respiratory system 04/30/2017  . Dyspnea on exertion   . Esophageal reflux disease 04/30/2017  . History of bone density study 06/28/2004  . Leg swelling   . Lumbar arthropathy   . Nasopharyngitis 04/30/2017  . Osteoarthropathy 04/30/2017  . Osteoporosis 04/30/2017  . Palpitations   . Sinusitis, acute 04/30/2017   unspecified    Past Surgical History:  Procedure Laterality Date  . CATARACT EXTRACTION     07/21/2001 - 07/20/2002  . CHOLECYSTECTOMY     07/21/1997 - 07/20/1998  . COLON SURGERY    . COLONOSCOPY     05/05/2000, 01/17/2000 Adenomatous Polyps   . COLONOSCOPY     11/05/2009, 12/23/2004, 07/07/2001   PH Adenomatous Polyps: CBF 10/2014; recall ltr mailed 09/18/2014 (dw)  . ESOPHAGOGASTRODUODENOSCOPY      11/05/2009, 05/05/2000  . FLEXIBLE SIGMOIDOSCOPY  11/28/1999  . HAND SURGERY  2006  . HERNIA REPAIR    . HIP ARTHROPLASTY Right 02/11/2018   Procedure: ARTHROPLASTY BIPOLAR HIP (HEMIARTHROPLASTY);  Surgeon: Corky Mull, MD;  Location: [facility] ORS;  Service: Orthopedics;  Laterality: Right;  . LAPAROSCOPIC COLON RESECTION     2001    There were no vitals filed for this visit.  Subjective Assessment - 06/22/19 1147    Subjective  Patient reports having mild soreness in bilateral LE after starting exercise. Denies any pain. Denies any new falls. She reports multiple stumbles.    Pertinent History  Teresa Chambers is an 22yoF who comes to [facility] OPPT for. PMH: falls s/p Rt hip fracture and Rt HHA July 2019, COPD, asthma, CHF, hernia repair. chronic venous insufficiency, Right shoulder fracture June 2020 managed conservatively and PT 20x.Pt is referred to OPPT for ongoing balance issues and several falls over the past 2 years. Pt reports some difficulty managing her feet when walking.    How long can you sit comfortably?  N/A    How long can you stand comfortably?  N/A "I'm basically lazy, not veyr active so not limited"    How long can you walk comfortably?  Pt reports limited community distances at  baseline, but dependent on [facility] or fixed objects for stability.    Currently in Pain?  No/denies    Multiple Pain Sites  No       TREATMENT: Warm up on Nustep BUE/BLE level 2 x4 min (Unbilled);    Patient instructed in advanced balance exercise  Standing in parallel bars:  Standing on airex foam: -alternate toe taps to 4 inch step with 2-1 rail assist x15 reps bilaterally; -Standing one foot on airex, one foot on 4 inch step, BUE ball pass side/side x5 reps each foot on step -Heel/toe raises x15 reps with 2-1 rail assist for balance -modified tandem stance:   Unsupported standing 10 sec hold x1 rep each foot in front;   head turns side/side x5 reps each foot in front; Patient required min VCs  for balance stability, including to increase trunk control for less loss of balance with smaller base of support  Standing on 1/2 foam: (Flat side up) -heel/toe rocks with feet apart heel/toe rocks x15 with rail assist for safety -feet apart, BUE wand flexion x10 reps with CGA for safety and cues to improve ankle strategies for better stance control -tandem stance with 2-0 rail assist 10 sec hold x3 reps each foot in front with CGA to min A for safety and cues to improve erect posture and increase weight shift for better stance control  Side stepping on 2x4 beam: -heel off x2 laps -toes off x2 laps Requires 2 HHA on rail for safety and cues to increase ankle DF/PF for better ankle strengthening/strategies. She required CGA for safety;   Exercise: Leg press: calf raise 60# x15 each LE with min VCs for proper positioning to isolate calf strengthening;    Response to treatment: Tolerated well. She does report increased fatigue and mild soreness at end of session. Patient verbalized understanding of HEP. She was able to exhibit improved foot clearance with gait at end of session with less foot drag.                   PT Education - 06/22/19 1148    Education Details  balance/HEP    Person(s) Educated  Patient    Methods  Explanation;Verbal cues    Comprehension  Verbalized understanding;Returned demonstration;Verbal cues required;Need further instruction       PT Short Term Goals - 06/20/19 1220      PT SHORT TERM GOAL #1   Title  After 4 weeks pt to demonstrate improved tolerance to 6MWT without foot scuffing LOB.    Baseline  2MWT at evaluation 0.46m/s (unable to go farther d/t back pain/fatigue)    Time  4    Period  Weeks    Status  New    Target Date  07/20/19      PT SHORT TERM GOAL #2   Title  After 4 weeks pt to demonstrate SLS>10sec bilat.    Baseline  At eval ~5-7sec bilat    Time  4    Period  Weeks    Status  New    Target Date  07/20/19         PT Long Term Goals - 06/20/19 1223      PT LONG TERM GOAL #1   Title  Pt to demonstrate improved SLS heel raise to >15 bilat without loss of height.    Baseline  Loss of height after 7x bilat    Time  8    Period  Weeks    Status  New    Target Date  08/19/18      PT LONG TERM GOAL #2   Title  Pt to demonstrate 5xSTS <11sec to demonstrate improved funcitonal ankle/hip strength and coordiantion.    Baseline  ~15sec at eval    Time  8    Period  Weeks    Status  New    Target Date  08/19/18      PT LONG TERM GOAL #3   Title  Pt to demonstrate improved SLS >25sec bilat    Baseline  ~5-7sec at eval    Time  8    Period  Weeks    Status  New    Target Date  08/19/00      PT LONG TERM GOAL #4   Title  Pt to demonstrate 4+/5 hip ABDCT strength bilat.    Baseline  4-/5 at eval    Time  8    Period  Weeks    Status  New    Target Date  08/19/18            Plan - 06/22/19 1413    Clinical Impression Statement  Patient motivated and participated well within session. Instructed patient in advanced balance exercise, using compliant surfaces to challenge stance control. She did have difficulty utilizing ankle strategies for balance recovery often relying on rail assist and UE control. Patient required min A with most balance exercise. She would benefit from additional skilled PT Intervention to improve strength, balance and gait safety;    Personal Factors and Comorbidities  Age;Behavior Pattern;Past/Current Experience;Fitness    Armed forces logistics/support/administrative officer;Reach Overhead;Bend;Locomotion Level;Lift    Examination-Participation Restrictions  Shop;Cleaning;Community Activity;Yard Work    Stability/Clinical Decision Making  Stable/Uncomplicated    Rehab Potential  Good    PT Frequency  2x / week    PT Duration  8 weeks    PT Treatment/Interventions  ADLs/Self Care Home Management;Electrical Stimulation;Moist Heat;Stair training;Gait training;DME  Instruction;Functional mobility training;Therapeutic activities;Therapeutic exercise;Balance training;Neuromuscular re-education;Patient/family education;Manual techniques;Passive range of motion;Dry needling;Vestibular;Taping    PT Next Visit Plan  Review goals, HEP; work on ankle strength, hip strength, and dynamic balance training    PT Home Exercise Plan  3x60sec heel strike AMB; 1x15 heel raises, 1x15 wall leaning dorsiflexion    Consulted and Agree with Plan of Care  Patient       Patient will benefit from skilled therapeutic intervention in order to improve the following deficits and impairments:  Abnormal gait, Decreased balance, Decreased endurance, Difficulty walking, Hypomobility, Increased muscle spasms, Decreased knowledge of precautions, Decreased range of motion, Decreased activity tolerance, Decreased coordination, Decreased knowledge of use of DME, Decreased strength, Postural dysfunction  Visit Diagnosis: Unsteadiness on feet  Stiffness of left ankle, not elsewhere classified     Problem List Patient Active Problem List   Diagnosis Date Noted  . Closed fracture of proximal end of right humerus 01/10/2019  . Chronic venous insufficiency 09/06/2018  . Lymphedema 09/06/2018  . GERD without esophagitis 03/10/2018  . Bilateral dry eyes 03/10/2018  . COPD (chronic obstructive pulmonary disease) ([place]) 03/04/2018  . Acute exacerbation of COPD with asthma ([place]) 02/22/2018  . Chronic anemia 02/22/2018  . Chronic constipation 02/22/2018  . Chronic bilateral thoracic back pain 02/22/2018  . Closed displaced fracture of right femoral neck ([place]) 02/22/2018  . Congenital heart failure ([place])   . Status post hip hemiarthroplasty 02/12/2018  . Hip fracture ([place]) 02/11/2018  . Asthma 04/30/2017  . Chronic reflux esophagitis 04/30/2017  .  Hyperlipemia 04/30/2017  . Osteoporosis 04/30/2017  . Cardiac murmur, previously undiagnosed 04/30/2017  . Diverticulosis of small intestine  without hemorrhage 04/30/2017  . Dyspnea on exertion 04/30/2017  . Hx of adenomatous colonic polyps 04/30/2017  . Hx of cardiomegaly 04/30/2017  . Lumbar arthropathy 04/30/2017  . Nasopharyngitis 04/30/2017  . Nonspecific chest pain 04/30/2017  . Osteoarthropathy 04/30/2017  . Leg swelling 04/30/2017  . Sinusitis, acute 04/30/2017  . History of bone density study 06/28/2004    Trotter,Margaret PT, DPT 06/22/2019, 2:53 PM  [place] MAIN [name] SERVICES [address] [place], [place], [address] Phone: [phone]   Fax:  [phone]  Name: Teresa Chambers MRN: [id number] Date of Birth: 12-07-1935

## 2019-06-27 ENCOUNTER — Other Ambulatory Visit: Payer: Self-pay

## 2019-06-27 ENCOUNTER — Ambulatory Visit: Payer: PPO

## 2019-06-27 DIAGNOSIS — M25672 Stiffness of left ankle, not elsewhere classified: Secondary | ICD-10-CM

## 2019-06-27 DIAGNOSIS — R2681 Unsteadiness on feet: Secondary | ICD-10-CM

## 2019-06-27 NOTE — Therapy (Signed)
[place] MAIN [facility] SERVICES [address] [place], [place], [address] Phone: [phone]   Fax:  [phone]  Physical Therapy Treatment  Patient Details  Name: Teresa Chambers MRN: [id number] Date of Birth: [birth year] No data recorded  Encounter Date: 06/27/2019  PT End of Session - 06/27/19 1619    Visit Number  3    Number of Visits  16    Date for PT Re-Evaluation  08/19/18    Authorization Type  HTA medicare    Authorization Time Period  06/20/19-08/19/18    Authorization - Visit Number  3    Authorization - Number of Visits  10    PT Start Time  [id number]    PT Stop Time  1228    PT Time Calculation (min)  43 min    Equipment Utilized During Treatment  Gait belt    Activity Tolerance  Patient tolerated treatment well;No increased pain    Behavior During Therapy  WFL for tasks assessed/performed       Past Medical History:  Diagnosis Date  . Acute thoracic back pain   . Asthma   . Chest pain    unspecified  . CHF (congestive heart failure) ([place]) 04/30/2017  . Chronic abdominal pain 05/01/2017   unspecified  . Congenital heart failure ([place])   . COPD (chronic obstructive pulmonary disease) ([place])   . Disease of upper respiratory system 04/30/2017  . Dyspnea on exertion   . Esophageal reflux disease 04/30/2017  . History of bone density study 06/28/2004  . Leg swelling   . Lumbar arthropathy   . Nasopharyngitis 04/30/2017  . Osteoarthropathy 04/30/2017  . Osteoporosis 04/30/2017  . Palpitations   . Sinusitis, acute 04/30/2017   unspecified    Past Surgical History:  Procedure Laterality Date  . CATARACT EXTRACTION     07/21/2001 - 07/20/2002  . CHOLECYSTECTOMY     07/21/1997 - 07/20/1998  . COLON SURGERY    . COLONOSCOPY     05/05/2000, 01/17/2000 Adenomatous Polyps   . COLONOSCOPY     11/05/2009, 12/23/2004, 07/07/2001   PH Adenomatous Polyps: CBF 10/2014; recall ltr mailed 09/18/2014 (dw)  . ESOPHAGOGASTRODUODENOSCOPY      11/05/2009, 05/05/2000  . FLEXIBLE SIGMOIDOSCOPY  11/28/1999  . HAND SURGERY  2006  . HERNIA REPAIR    . HIP ARTHROPLASTY Right 02/11/2018   Procedure: ARTHROPLASTY BIPOLAR HIP (HEMIARTHROPLASTY);  Surgeon: Corky Mull, MD;  Location: [facility] ORS;  Service: Orthopedics;  Laterality: Right;  . LAPAROSCOPIC COLON RESECTION     2001    There were no vitals filed for this visit.  Subjective Assessment - 06/27/19 1148    Subjective  Patient reports that she has had multiple stumbles. Reported some generalized knee and hip pain bilaterally.    Pertinent History  Keon Waltermire is an 45yoF who comes to [facility] OPPT for. PMH: falls s/p Rt hip fracture and Rt HHA July 2019, COPD, asthma, CHF, hernia repair. chronic venous insufficiency, Right shoulder fracture June 2020 managed conservatively and PT 20x.Pt is referred to OPPT for ongoing balance issues and several falls over the past 2 years. Pt reports some difficulty managing her feet when walking.    How long can you sit comfortably?  N/A    How long can you stand comfortably?  N/A "I'm basically lazy, not veyr active so not limited"    How long can you walk comfortably?  Pt reports limited community distances at baseline, but dependent on [facility] or  fixed objects for stability.    Currently in Pain?  Yes    Pain Score  3     Pain Location  --   knees and hips   Pain Orientation  Right;Left    Pain Descriptors / Indicators  Tightness    Pain Type  Chronic pain    Pain Onset  More than a month ago    Pain Frequency  Intermittent       TREATMENT:  NMR: Warm up on Nustep BUE/BLE level 2 x4 min (Unbilled);               Patient instructed in advanced balance exercise   Standing in parallel bars with CGA   Standing on airex foam: -alternate toe taps to 4 inch step with 1 rail assist x15 reps bilaterally; no rail assist x15  Alternating lateral toe taps to 4in step with 1 rail assist x15 bilaterally  -Heel/toe raises x15 reps with 1 rail assist  for balance -modified tandem stance:              Unsupported standing 25 sec hold x2 rep each foot in front;   on firm surface:  feet together head turns side/side x10 reps each foot in front; Step over hurdles forward and backwardsbilaterally x10 ea, cues for posture, hip/knee flexion  Instructed in standing marching with unilateral support, x10 ea side and added to HEP   Standing on 1/2 foam: (Flat side up) -heel/toe rocks with feet apart heel/toe rocks x15 with rail assist for safety -feet apart, BUE wand flexion x10 reps with CGA for safety and cues to improve ankle strategies for better stance control  Sit to stands from standard chair, unable to complete without at least unilateral UE support. Instructed to add to HEP as well. X8 reps.     Patient response/clinical impression: The patient with good motivation during therapy. HEP updated to encourage strengthening and functional ability. The patient was most challenged by decreased base of support and fearful of activities without UE support. The patient would benefit from further skilled PT intervention to continue to progress towards goals.     PT Education - 06/27/19 1150    Education Details  balance/HEP    Person(s) Educated  Patient    Methods  Explanation;Demonstration;Tactile cues;Verbal cues    Comprehension  Verbalized understanding;Returned demonstration;Verbal cues required;Tactile cues required;Need further instruction       PT Short Term Goals - 06/20/19 1220      PT SHORT TERM GOAL #1   Title  After 4 weeks pt to demonstrate improved tolerance to 6MWT without foot scuffing LOB.    Baseline  2MWT at evaluation 0.81m/s (unable to go farther d/t back pain/fatigue)    Time  4    Period  Weeks    Status  New    Target Date  07/20/19      PT SHORT TERM GOAL #2   Title  After 4 weeks pt to demonstrate SLS>10sec bilat.    Baseline  At eval ~5-7sec bilat    Time  4    Period  Weeks    Status  New    Target  Date  07/20/19        PT Long Term Goals - 06/20/19 1223      PT LONG TERM GOAL #1   Title  Pt to demonstrate improved SLS heel raise to >15 bilat without loss of height.    Baseline  Loss of height after 7x  bilat    Time  8    Period  Weeks    Status  New    Target Date  08/19/18      PT LONG TERM GOAL #2   Title  Pt to demonstrate 5xSTS <11sec to demonstrate improved funcitonal ankle/hip strength and coordiantion.    Baseline  ~15sec at eval    Time  8    Period  Weeks    Status  New    Target Date  08/19/18      PT LONG TERM GOAL #3   Title  Pt to demonstrate improved SLS >25sec bilat    Baseline  ~5-7sec at eval    Time  8    Period  Weeks    Status  New    Target Date  08/19/00      PT LONG TERM GOAL #4   Title  Pt to demonstrate 4+/5 hip ABDCT strength bilat.    Baseline  4-/5 at eval    Time  8    Period  Weeks    Status  New    Target Date  08/19/18            Plan - 06/27/19 1619    Clinical Impression Statement  The patient with good motivation during therapy. HEP updated to encourage strengthening and functional ability. The patient was most challenged by decreased base of support and fearful of activities without UE support. The patient would benefit from further skilled PT intervention to continue to progress towards goals.    Personal Factors and Comorbidities  Age;Behavior Pattern;Past/Current Experience;Fitness    Armed forces logistics/support/administrative officer;Reach Overhead;Bend;Locomotion Level;Lift    Examination-Participation Restrictions  Shop;Cleaning;Community Activity;Yard Work    Stability/Clinical Decision Making  Stable/Uncomplicated    Rehab Potential  Good    PT Frequency  2x / week    PT Duration  8 weeks    PT Treatment/Interventions  ADLs/Self Care Home Management;Electrical Stimulation;Moist Heat;Stair training;Gait training;DME Instruction;Functional mobility training;Therapeutic activities;Therapeutic exercise;Balance  training;Neuromuscular re-education;Patient/family education;Manual techniques;Passive range of motion;Dry needling;Vestibular;Taping    PT Next Visit Plan  Review goals, HEP; work on ankle strength, hip strength, and dynamic balance training    PT Home Exercise Plan  3x60sec heel strike AMB; 1x15 heel raises, 1x15 wall leaning dorsiflexion    Consulted and Agree with Plan of Care  Patient       Patient will benefit from skilled therapeutic intervention in order to improve the following deficits and impairments:  Abnormal gait, Decreased balance, Decreased endurance, Difficulty walking, Hypomobility, Increased muscle spasms, Decreased knowledge of precautions, Decreased range of motion, Decreased activity tolerance, Decreased coordination, Decreased knowledge of use of DME, Decreased strength, Postural dysfunction  Visit Diagnosis: Unsteadiness on feet  Stiffness of left ankle, not elsewhere classified     Problem List Patient Active Problem List   Diagnosis Date Noted  . Closed fracture of proximal end of right humerus 01/10/2019  . Chronic venous insufficiency 09/06/2018  . Lymphedema 09/06/2018  . GERD without esophagitis 03/10/2018  . Bilateral dry eyes 03/10/2018  . COPD (chronic obstructive pulmonary disease) ([place]) 03/04/2018  . Acute exacerbation of COPD with asthma ([place]) 02/22/2018  . Chronic anemia 02/22/2018  . Chronic constipation 02/22/2018  . Chronic bilateral thoracic back pain 02/22/2018  . Closed displaced fracture of right femoral neck ([place]) 02/22/2018  . Congenital heart failure ([place])   . Status post hip hemiarthroplasty 02/12/2018  . Hip fracture ([place]) 02/11/2018  . Asthma 04/30/2017  . Chronic  reflux esophagitis 04/30/2017  . Hyperlipemia 04/30/2017  . Osteoporosis 04/30/2017  . Cardiac murmur, previously undiagnosed 04/30/2017  . Diverticulosis of small intestine without hemorrhage 04/30/2017  . Dyspnea on exertion 04/30/2017  . Hx of adenomatous colonic  polyps 04/30/2017  . Hx of cardiomegaly 04/30/2017  . Lumbar arthropathy 04/30/2017  . Nasopharyngitis 04/30/2017  . Nonspecific chest pain 04/30/2017  . Osteoarthropathy 04/30/2017  . Leg swelling 04/30/2017  . Sinusitis, acute 04/30/2017  . History of bone density study 06/28/2004    Lieutenant Diego PT, DPT 4:25 PM,06/27/19 [phone]  [place] MAIN [facility] SERVICES [address] [place], [place], [address] Phone: [phone]   Fax:  [phone]  Name: Teresa Chambers MRN: [id number] Date of Birth: Mar 30, 1936

## 2019-06-30 ENCOUNTER — Other Ambulatory Visit: Payer: Self-pay

## 2019-06-30 ENCOUNTER — Encounter: Payer: Self-pay | Admitting: Physical Therapy

## 2019-06-30 ENCOUNTER — Ambulatory Visit: Payer: PPO | Admitting: Physical Therapy

## 2019-06-30 DIAGNOSIS — R2681 Unsteadiness on feet: Secondary | ICD-10-CM

## 2019-06-30 DIAGNOSIS — M25672 Stiffness of left ankle, not elsewhere classified: Secondary | ICD-10-CM

## 2019-06-30 NOTE — Patient Instructions (Signed)
Access Code: [email or web address]  URL: [email or web address]  Date: 06/30/2019  Prepared by: Blanche East   Exercises  Seated Ankle Dorsiflexion with Resistance - 15 reps - 2 sets - 1x daily - 7x weekly  Backward Walking with Counter Support - 5 reps - 2 sets - 1x daily - 7x weekly  Standing Romberg to 1/2 Tandem Stance - 2 reps - 30 sec hold - 1x daily - 7x weekly  Half Tandem Stance Balance with Head Rotation - 10 reps - 2 sets - 1x daily - 7x weekly  Half Tandem Stance Balance with Head Nods - 10 reps - 2 sets - 1x daily - 7x weekly  Half Tandem Stance Balance with Eyes Closed - 3 reps - 10 sec hold - 1x daily - 7x weekly

## 2019-06-30 NOTE — Therapy (Signed)
[place] MAIN [facility] SERVICES [address] [place], [place], [address] Phone: [phone]   Fax:  [phone]  Physical Therapy Treatment  Patient Details  Name: Teresa Chambers MRN: [id number] Date of Birth: Mar 25, 1936 No data recorded  Encounter Date: 06/30/2019  PT End of Session - 06/30/19 1143    Visit Number  4    Number of Visits  16    Date for PT Re-Evaluation  08/19/18    Authorization Type  HTA medicare    Authorization Time Period  06/20/19-08/19/18    Authorization - Visit Number  4    Authorization - Number of Visits  10    PT Start Time  [id number]    PT Stop Time  1225    PT Time Calculation (min)  47 min    Equipment Utilized During Treatment  Gait belt    Activity Tolerance  Patient tolerated treatment well;No increased pain    Behavior During Therapy  WFL for tasks assessed/performed       Past Medical History:  Diagnosis Date  . Acute thoracic back pain   . Asthma   . Chest pain    unspecified  . CHF (congestive heart failure) ([place]) 04/30/2017  . Chronic abdominal pain 05/01/2017   unspecified  . Congenital heart failure ([place])   . COPD (chronic obstructive pulmonary disease) ([place])   . Disease of upper respiratory system 04/30/2017  . Dyspnea on exertion   . Esophageal reflux disease 04/30/2017  . History of bone density study 06/28/2004  . Leg swelling   . Lumbar arthropathy   . Nasopharyngitis 04/30/2017  . Osteoarthropathy 04/30/2017  . Osteoporosis 04/30/2017  . Palpitations   . Sinusitis, acute 04/30/2017   unspecified    Past Surgical History:  Procedure Laterality Date  . CATARACT EXTRACTION     07/21/2001 - 07/20/2002  . CHOLECYSTECTOMY     07/21/1997 - 07/20/1998  . COLON SURGERY    . COLONOSCOPY     05/05/2000, 01/17/2000 Adenomatous Polyps   . COLONOSCOPY     11/05/2009, 12/23/2004, 07/07/2001   PH Adenomatous Polyps: CBF 10/2014; recall ltr mailed 09/18/2014 (dw)  . ESOPHAGOGASTRODUODENOSCOPY      11/05/2009, 05/05/2000  . FLEXIBLE SIGMOIDOSCOPY  11/28/1999  . HAND SURGERY  2006  . HERNIA REPAIR    . HIP ARTHROPLASTY Right 02/11/2018   Procedure: ARTHROPLASTY BIPOLAR HIP (HEMIARTHROPLASTY);  Surgeon: Corky Mull, MD;  Location: [facility] ORS;  Service: Orthopedics;  Laterality: Right;  . LAPAROSCOPIC COLON RESECTION     2001    There were no vitals filed for this visit.  Subjective Assessment - 06/30/19 1141    Subjective  Patient reports increased soreness in her arms and legs. She reports her exercises at home are going pretty well. She reports feeling unsure and hesitant when doing exercise against wall for balance.    Pertinent History  Trameka Kooi is an 35yoF who comes to [facility] OPPT for. PMH: falls s/p Rt hip fracture and Rt HHA July 2019, COPD, asthma, CHF, hernia repair. chronic venous insufficiency, Right shoulder fracture June 2020 managed conservatively and PT 20x.Pt is referred to OPPT for ongoing balance issues and several falls over the past 2 years. Pt reports some difficulty managing her feet when walking.    How long can you sit comfortably?  N/A    How long can you stand comfortably?  N/A "I'm basically lazy, not veyr active so not limited"    How long can  you walk comfortably?  Pt reports limited community distances at baseline, but dependent on [facility] or fixed objects for stability.    Currently in Pain?  Yes    Pain Score  2     Pain Location  Leg    Pain Orientation  Right;Left    Pain Descriptors / Indicators  Aching;Sore    Pain Type  Chronic pain    Pain Onset  More than a month ago    Pain Frequency  Intermittent    Aggravating Factors   worse with moving or standing for long period of time    Pain Relieving Factors  rest    Effect of Pain on Daily Activities  decreased activity tolerance, pushes through    Multiple Pain Sites  No         TREATMENT: Warm up on Nustep BUE/BLE level 2 x4 min (Unbilled);               Patient instructed in advanced  balance exercise   Standing in parallel bars:   Standing on airex foam: -alternate toe taps to 4 inch step with 1-0 rail assist x15 reps bilaterally; -Standing one foot on airex, one foot on 4 inch step  Unsupported standing x15 sec with eyes open/closed x2 reps each;  BUE ball pass side/side x5 reps each foot on step -Heel raises 3 sec hold x12 reps with 0 rail assist, requiring min A for balance -modified tandem stance:              Unsupported standing 10 sec hold x1 rep each foot in front;              head turns side/side x5 reps, up/down x5 reps each foot in front; Patient required min VCs for balance stability, including to increase trunk control for less loss of balance with smaller base of support; She required CGA  with most balance exercise for better stance control;    Instructed patient in balance exercise as part of HEP: Forward/Backward Walking with intermittent Counter Support , x10 feet x3 laps each Standing Romberg to 1/2 Tandem Stance - 2 reps - 30 sec hold Half Tandem Stance Balance with Head Rotation - 10 reps  Half Tandem Stance Balance with Head Nods - 10 reps  Half Tandem Stance Balance with Eyes Closed - 3 reps - 10 sec hold -   Patient able to complete balance exercise with good safety awareness and positioning; Provided written HEP for better adherence;   Resisted walking 12.5# forward/backward, side/side (4 way) x2 laps each with CGA for safety and cues to slow down eccentric return for better dynamic balance;    Exercise:  Seated with green tband tied around BLE: -ankle DF 2x15 with cues for proper positioning for optimal strengthening;    Response to treatment: Tolerated well. Patient verbalized understanding of HEP. She does require CGA with advanced balance exercise especially with compliant surfaces and with reduced rail assist. She exhibits ankle instability with advanced balance tasks which could be contributing to imbalance. Instructed patient in  ankle strengthening exercise as part of HEP for better ankle control. Provided written HEP for better adherence;                        PT Education - 06/30/19 1143    Education Details  balance/strength/HEP    Person(s) Educated  Patient    Methods  Explanation;Verbal cues    Comprehension  Verbalized understanding;Returned demonstration;Verbal  cues required;Need further instruction       PT Short Term Goals - 06/20/19 1220      PT SHORT TERM GOAL #1   Title  After 4 weeks pt to demonstrate improved tolerance to 6MWT without foot scuffing LOB.    Baseline  2MWT at evaluation 0.64m/s (unable to go farther d/t back pain/fatigue)    Time  4    Period  Weeks    Status  New    Target Date  07/20/19      PT SHORT TERM GOAL #2   Title  After 4 weeks pt to demonstrate SLS>10sec bilat.    Baseline  At eval ~5-7sec bilat    Time  4    Period  Weeks    Status  New    Target Date  07/20/19        PT Long Term Goals - 06/20/19 1223      PT LONG TERM GOAL #1   Title  Pt to demonstrate improved SLS heel raise to >15 bilat without loss of height.    Baseline  Loss of height after 7x bilat    Time  8    Period  Weeks    Status  New    Target Date  08/19/18      PT LONG TERM GOAL #2   Title  Pt to demonstrate 5xSTS <11sec to demonstrate improved funcitonal ankle/hip strength and coordiantion.    Baseline  ~15sec at eval    Time  8    Period  Weeks    Status  New    Target Date  08/19/18      PT LONG TERM GOAL #3   Title  Pt to demonstrate improved SLS >25sec bilat    Baseline  ~5-7sec at eval    Time  8    Period  Weeks    Status  New    Target Date  08/19/00      PT LONG TERM GOAL #4   Title  Pt to demonstrate 4+/5 hip ABDCT strength bilat.    Baseline  4-/5 at eval    Time  8    Period  Weeks    Status  New    Target Date  08/19/18            Plan - 06/30/19 1313    Clinical Impression Statement  Patient motivated and participated well  within session. Instructed patient in advanced LE strengthening exercises, focusing on ankle strength. She requires min VCs for proper positioning for optimal muscle activation. Patient exhibits increased ankle instabiltiy which affects balance control. Advanced HEP with strength and balance exercise, providing written HEP for better adherence. Patient instructed in balance exercise, utilizing compliant surfaces to challenge stability. She does require CGA with most balance exercise and had increased difficulty with reduced rail assist or with narrow base of support. Patient would benefit from additional skilled PT Intervention to improve strength, balance and gait safety;    Personal Factors and Comorbidities  Age;Behavior Pattern;Past/Current Experience;Fitness    Armed forces logistics/support/administrative officer;Reach Overhead;Bend;Locomotion Level;Lift    Examination-Participation Restrictions  Shop;Cleaning;Community Activity;Yard Work    Stability/Clinical Decision Making  Stable/Uncomplicated    Rehab Potential  Good    PT Frequency  2x / week    PT Duration  8 weeks    PT Treatment/Interventions  ADLs/Self Care Home Management;Electrical Stimulation;Moist Heat;Stair training;Gait training;DME Instruction;Functional mobility training;Therapeutic activities;Therapeutic exercise;Balance training;Neuromuscular re-education;Patient/family education;Manual techniques;Passive range of motion;Dry needling;Vestibular;Taping  PT Next Visit Plan  Review goals, HEP; work on ankle strength, hip strength, and dynamic balance training    PT Home Exercise Plan  3x60sec heel strike AMB; 1x15 heel raises, 1x15 wall leaning dorsiflexion    Consulted and Agree with Plan of Care  Patient       Patient will benefit from skilled therapeutic intervention in order to improve the following deficits and impairments:  Abnormal gait, Decreased balance, Decreased endurance, Difficulty walking, Hypomobility, Increased muscle  spasms, Decreased knowledge of precautions, Decreased range of motion, Decreased activity tolerance, Decreased coordination, Decreased knowledge of use of DME, Decreased strength, Postural dysfunction  Visit Diagnosis: Unsteadiness on feet  Stiffness of left ankle, not elsewhere classified     Problem List Patient Active Problem List   Diagnosis Date Noted  . Closed fracture of proximal end of right humerus 01/10/2019  . Chronic venous insufficiency 09/06/2018  . Lymphedema 09/06/2018  . GERD without esophagitis 03/10/2018  . Bilateral dry eyes 03/10/2018  . COPD (chronic obstructive pulmonary disease) ([place]) 03/04/2018  . Acute exacerbation of COPD with asthma ([place]) 02/22/2018  . Chronic anemia 02/22/2018  . Chronic constipation 02/22/2018  . Chronic bilateral thoracic back pain 02/22/2018  . Closed displaced fracture of right femoral neck ([place]) 02/22/2018  . Congenital heart failure ([place])   . Status post hip hemiarthroplasty 02/12/2018  . Hip fracture ([place]) 02/11/2018  . Asthma 04/30/2017  . Chronic reflux esophagitis 04/30/2017  . Hyperlipemia 04/30/2017  . Osteoporosis 04/30/2017  . Cardiac murmur, previously undiagnosed 04/30/2017  . Diverticulosis of small intestine without hemorrhage 04/30/2017  . Dyspnea on exertion 04/30/2017  . Hx of adenomatous colonic polyps 04/30/2017  . Hx of cardiomegaly 04/30/2017  . Lumbar arthropathy 04/30/2017  . Nasopharyngitis 04/30/2017  . Nonspecific chest pain 04/30/2017  . Osteoarthropathy 04/30/2017  . Leg swelling 04/30/2017  . Sinusitis, acute 04/30/2017  . History of bone density study 06/28/2004    , PT, DPT 06/30/2019, 1:18 PM  [place] MAIN [facility] SERVICES [address] [place], [place], [address] Phone: [phone]   Fax:  [phone]  Name: Teresa Chambers MRN: [id number] Date of Birth: 1935/08/13

## 2019-07-05 ENCOUNTER — Encounter: Payer: Self-pay | Admitting: Physical Therapy

## 2019-07-05 ENCOUNTER — Other Ambulatory Visit: Payer: Self-pay

## 2019-07-05 ENCOUNTER — Ambulatory Visit: Payer: PPO | Admitting: Physical Therapy

## 2019-07-05 DIAGNOSIS — R2681 Unsteadiness on feet: Secondary | ICD-10-CM

## 2019-07-05 DIAGNOSIS — M25672 Stiffness of left ankle, not elsewhere classified: Secondary | ICD-10-CM

## 2019-07-05 NOTE — Therapy (Signed)
[place] MAIN [facility] SERVICES [address] [place], [place], [address] Phone: [phone]   Fax:  [phone]  Physical Therapy Treatment  Patient Details  Name: Teresa Chambers MRN: [id number] Date of Birth: 1935-10-07 No data recorded  Encounter Date: 07/05/2019  PT End of Session - 07/05/19 1322    Visit Number  5    Number of Visits  16    Date for PT Re-Evaluation  08/19/18    Authorization Type  HTA medicare    Authorization Time Period  06/20/19-08/19/18    Authorization - Visit Number  5    Authorization - Number of Visits  10    PT Start Time  1146    PT Stop Time  1225    PT Time Calculation (min)  39 min    Equipment Utilized During Treatment  Gait belt    Activity Tolerance  Patient tolerated treatment well;No increased pain    Behavior During Therapy  WFL for tasks assessed/performed       Past Medical History:  Diagnosis Date  . Acute thoracic back pain   . Asthma   . Chest pain    unspecified  . CHF (congestive heart failure) ([place]) 04/30/2017  . Chronic abdominal pain 05/01/2017   unspecified  . Congenital heart failure ([place])   . COPD (chronic obstructive pulmonary disease) ([place])   . Disease of upper respiratory system 04/30/2017  . Dyspnea on exertion   . Esophageal reflux disease 04/30/2017  . History of bone density study 06/28/2004  . Leg swelling   . Lumbar arthropathy   . Nasopharyngitis 04/30/2017  . Osteoarthropathy 04/30/2017  . Osteoporosis 04/30/2017  . Palpitations   . Sinusitis, acute 04/30/2017   unspecified    Past Surgical History:  Procedure Laterality Date  . CATARACT EXTRACTION     07/21/2001 - 07/20/2002  . CHOLECYSTECTOMY     07/21/1997 - 07/20/1998  . COLON SURGERY    . COLONOSCOPY     05/05/2000, 01/17/2000 Adenomatous Polyps   . COLONOSCOPY     11/05/2009, 12/23/2004, 07/07/2001   PH Adenomatous Polyps: CBF 10/2014; recall ltr mailed 09/18/2014 (dw)  . ESOPHAGOGASTRODUODENOSCOPY      11/05/2009, 05/05/2000  . FLEXIBLE SIGMOIDOSCOPY  11/28/1999  . HAND SURGERY  2006  . HERNIA REPAIR    . HIP ARTHROPLASTY Right 02/11/2018   Procedure: ARTHROPLASTY BIPOLAR HIP (HEMIARTHROPLASTY);  Surgeon: Corky Mull, MD;  Location: [facility] ORS;  Service: Orthopedics;  Laterality: Right;  . LAPAROSCOPIC COLON RESECTION     2001    There were no vitals filed for this visit.  Subjective Assessment - 07/05/19 1151    Subjective  Patient reports continued soreness in BUE shoulders. She reports adherence with HEP and states that they have been going well. She denies any new falls but reports feeling unsteady quite often.    Pertinent History  Teresa Chambers is an 23yoF who comes to [name] OPPT for. PMH: falls s/p Rt hip fracture and Rt HHA July 2019, COPD, asthma, CHF, hernia repair. chronic venous insufficiency, Right shoulder fracture June 2020 managed conservatively and PT 20x.Pt is referred to OPPT for ongoing balance issues and several falls over the past 2 years. Pt reports some difficulty managing her feet when walking.    How long can you sit comfortably?  N/A    How long can you stand comfortably?  N/A "I'm basically lazy, not veyr active so not limited"    How long can you  walk comfortably?  Pt reports limited community distances at baseline, but dependent on [facility] or fixed objects for stability.    Currently in Pain?  Yes    Pain Score  2     Pain Location  Shoulder    Pain Orientation  Right;Left    Pain Descriptors / Indicators  Aching;Sore    Pain Type  Acute pain    Pain Onset  1 to 4 weeks ago    Pain Frequency  Intermittent    Aggravating Factors   worse with moving    Pain Relieving Factors  rest    Effect of Pain on Daily Activities  decreased reaching tolerance;    Multiple Pain Sites  No        TREATMENT: Warm up on Nustep BUE/BLE level 2 x4 min (Unbilled);  Patient instructed in advanced balance exercise  Standing in parallel bars:  Standing on 1/2  foam: (Flat side up) -heel/toe rocks with feet apart heel/toe rocks x15 with rail assist for safety -feet apart, unsupported standing eyes open/closed 15 sec hold x3 sets each; -tandem stance with 2-0 rail assist 10 sec hold x2 reps each foot in front with CGA to min A for safety and cues to improve erect posture and increase weight shift for better stance control -tandem stance unsupported with BUE ball pass side/side x5 reps each foot in front;    Resisted walking 12.5# forward/backward, side/side (4 way) x2 laps each with CGA for safety and cues to slow down eccentric return for better dynamic balance;   Weaving around cones #7 x2 laps with CGA to close supervision with min VCs to increase step length for better balance with turning. Patient has increased difficulty due to genu valgus in BLE knee.  Side stepping over cones #7 x1 lap each direction with CGA to min A for safety with mod Vcs to increase step length and improve hip flexion for better cone negotiation. Patient had increased difficulty initially but was able to progress with improved step length with improved repetition. She did require increased time to complete task  Picking up cones #7 with minimal difficulty noted, mod I   Response to treatment: Tolerated well. Patient verbalized understanding of HEP. She does require CGA-minA with advanced balance exercise especially with compliant surfaces and with reduced rail assist. She exhibits ankle instability with advanced balance tasks which could be contributing to imbalance. Advanced balance exercise with dynamic tasks. Patient required increased time and assistance with dynamic balance having difficulty negotiating cones.               PT Education - 07/05/19 1322    Education Details  balance/strength/HEP    Person(s) Educated  Patient    Methods  Explanation;Verbal cues    Comprehension  Verbalized understanding;Returned demonstration;Verbal cues required;Need  further instruction       PT Short Term Goals - 06/20/19 1220      PT SHORT TERM GOAL #1   Title  After 4 weeks pt to demonstrate improved tolerance to 6MWT without foot scuffing LOB.    Baseline  2MWT at evaluation 0.66m/s (unable to go farther d/t back pain/fatigue)    Time  4    Period  Weeks    Status  New    Target Date  07/20/19      PT SHORT TERM GOAL #2   Title  After 4 weeks pt to demonstrate SLS>10sec bilat.    Baseline  At eval ~5-7sec bilat  Time  4    Period  Weeks    Status  New    Target Date  07/20/19        PT Long Term Goals - 06/20/19 1223      PT LONG TERM GOAL #1   Title  Pt to demonstrate improved SLS heel raise to >15 bilat without loss of height.    Baseline  Loss of height after 7x bilat    Time  8    Period  Weeks    Status  New    Target Date  08/19/18      PT LONG TERM GOAL #2   Title  Pt to demonstrate 5xSTS <11sec to demonstrate improved funcitonal ankle/hip strength and coordiantion.    Baseline  ~15sec at eval    Time  8    Period  Weeks    Status  New    Target Date  08/19/18      PT LONG TERM GOAL #3   Title  Pt to demonstrate improved SLS >25sec bilat    Baseline  ~5-7sec at eval    Time  8    Period  Weeks    Status  New    Target Date  08/19/00      PT LONG TERM GOAL #4   Title  Pt to demonstrate 4+/5 hip ABDCT strength bilat.    Baseline  4-/5 at eval    Time  8    Period  Weeks    Status  New    Target Date  08/19/18            Plan - 07/05/19 1323    Clinical Impression Statement  Patient motivated and participated well within session. She does require cues for proper weight shift and stance control while on compliant surfaces. Instructed patient to work on increasing ankle strategies for balance recovery rather than relying on UE support or trunk control. Patient did have increased difficulty with dynamic balance tasks particularly side stepping over cones or weaving around cones. She would benefit from  additional skilled PT intervention to improve strength, balance and gait safety;    Personal Factors and Comorbidities  Age;Behavior Pattern;Past/Current Experience;Fitness    Armed forces logistics/support/administrative officer;Reach Overhead;Bend;Locomotion Level;Lift    Examination-Participation Restrictions  Shop;Cleaning;Community Activity;Yard Work    Stability/Clinical Decision Making  Stable/Uncomplicated    Rehab Potential  Good    PT Frequency  2x / week    PT Duration  8 weeks    PT Treatment/Interventions  ADLs/Self Care Home Management;Electrical Stimulation;Moist Heat;Stair training;Gait training;DME Instruction;Functional mobility training;Therapeutic activities;Therapeutic exercise;Balance training;Neuromuscular re-education;Patient/family education;Manual techniques;Passive range of motion;Dry needling;Vestibular;Taping    PT Next Visit Plan  Review goals, HEP; work on ankle strength, hip strength, and dynamic balance training    PT Home Exercise Plan  3x60sec heel strike AMB; 1x15 heel raises, 1x15 wall leaning dorsiflexion    Consulted and Agree with Plan of Care  Patient       Patient will benefit from skilled therapeutic intervention in order to improve the following deficits and impairments:  Abnormal gait, Decreased balance, Decreased endurance, Difficulty walking, Hypomobility, Increased muscle spasms, Decreased knowledge of precautions, Decreased range of motion, Decreased activity tolerance, Decreased coordination, Decreased knowledge of use of DME, Decreased strength, Postural dysfunction  Visit Diagnosis: Unsteadiness on feet  Stiffness of left ankle, not elsewhere classified     Problem List Patient Active Problem List   Diagnosis Date Noted  . Closed fracture of proximal  end of right humerus 01/10/2019  . Chronic venous insufficiency 09/06/2018  . Lymphedema 09/06/2018  . GERD without esophagitis 03/10/2018  . Bilateral dry eyes 03/10/2018  . COPD (chronic  obstructive pulmonary disease) ([place]) 03/04/2018  . Acute exacerbation of COPD with asthma ([place]) 02/22/2018  . Chronic anemia 02/22/2018  . Chronic constipation 02/22/2018  . Chronic bilateral thoracic back pain 02/22/2018  . Closed displaced fracture of right femoral neck ([place]) 02/22/2018  . Congenital heart failure ([place])   . Status post hip hemiarthroplasty 02/12/2018  . Hip fracture ([place]) 02/11/2018  . Asthma 04/30/2017  . Chronic reflux esophagitis 04/30/2017  . Hyperlipemia 04/30/2017  . Osteoporosis 04/30/2017  . Cardiac murmur, previously undiagnosed 04/30/2017  . Diverticulosis of small intestine without hemorrhage 04/30/2017  . Dyspnea on exertion 04/30/2017  . Hx of adenomatous colonic polyps 04/30/2017  . Hx of cardiomegaly 04/30/2017  . Lumbar arthropathy 04/30/2017  . Nasopharyngitis 04/30/2017  . Nonspecific chest pain 04/30/2017  . Osteoarthropathy 04/30/2017  . Leg swelling 04/30/2017  . Sinusitis, acute 04/30/2017  . History of bone density study 06/28/2004    Qunisha Bryk PT, DPT 07/05/2019, 1:25 PM  [place] MAIN [facility] SERVICES [address] [place], [place], [address] Phone: [phone]   Fax:  [phone]  Name: RHILEE SHEAN MRN: [id number] Date of Birth: 1936/07/14

## 2019-07-07 ENCOUNTER — Encounter: Payer: Self-pay | Admitting: Physical Therapy

## 2019-07-07 ENCOUNTER — Ambulatory Visit: Payer: PPO | Admitting: Physical Therapy

## 2019-07-07 ENCOUNTER — Other Ambulatory Visit: Payer: Self-pay

## 2019-07-07 DIAGNOSIS — R2681 Unsteadiness on feet: Secondary | ICD-10-CM

## 2019-07-07 DIAGNOSIS — M25672 Stiffness of left ankle, not elsewhere classified: Secondary | ICD-10-CM

## 2019-07-07 NOTE — Patient Instructions (Signed)
Access Code: [email or web address]  URL: [email or web address]  Date: 07/07/2019  Prepared by: Blanche East   Exercises  Carioca with Counter Support - 5 reps - 2 sets - 1x daily - 7x weekly  Side Stepping with Resistance at Thighs - 5 reps - 2 sets - 1x daily - 7x weekly  Forward Monster Walks - 5 reps - 2 sets - 1x daily - 7x weekly

## 2019-07-07 NOTE — Therapy (Signed)
MAIN [facility] SERVICES [address] [place], [place], [address] Phone: [phone]   Fax:  [phone]  Physical Therapy Treatment  Patient Details  Name: MARAH WIGET MRN: [id number] Date of Birth: [birth year] No data recorded  Encounter Date: 07/07/2019  PT End of Session - 07/07/19 1222    Visit Number  6    Number of Visits  16    Date for PT Re-Evaluation  08/19/18    Authorization Type  HTA medicare    Authorization Time Period  06/20/19-08/19/18    Authorization - Visit Number  6    Authorization - Number of Visits  10    PT Start Time  [id number]    PT Stop Time  1145    PT Time Calculation (min)  43 min    Equipment Utilized During Treatment  Gait belt    Activity Tolerance  Patient tolerated treatment well;No increased pain    Behavior During Therapy  WFL for tasks assessed/performed       Past Medical History:  Diagnosis Date  . Acute thoracic back pain   . Asthma   . Chest pain    unspecified  . CHF (congestive heart failure) ([place]) 04/30/2017  . Chronic abdominal pain 05/01/2017   unspecified  . Congenital heart failure ([place])   . COPD (chronic obstructive pulmonary disease) ([place])   . Disease of upper respiratory system 04/30/2017  . Dyspnea on exertion   . Esophageal reflux disease 04/30/2017  . History of bone density study 06/28/2004  . Leg swelling   . Lumbar arthropathy   . Nasopharyngitis 04/30/2017  . Osteoarthropathy 04/30/2017  . Osteoporosis 04/30/2017  . Palpitations   . Sinusitis, acute 04/30/2017   unspecified    Past Surgical History:  Procedure Laterality Date  . CATARACT EXTRACTION     07/21/2001 - 07/20/2002  . CHOLECYSTECTOMY     07/21/1997 - 07/20/1998  . COLON SURGERY    . COLONOSCOPY     05/05/2000, 01/17/2000 Adenomatous Polyps   . COLONOSCOPY     11/05/2009, 12/23/2004, 07/07/2001   PH Adenomatous Polyps: CBF 10/2014; recall ltr mailed 09/18/2014 (dw)  . ESOPHAGOGASTRODUODENOSCOPY      11/05/2009, 05/05/2000  . FLEXIBLE SIGMOIDOSCOPY  11/28/1999  . HAND SURGERY  2006  . HERNIA REPAIR    . HIP ARTHROPLASTY Right 02/11/2018   Procedure: ARTHROPLASTY BIPOLAR HIP (HEMIARTHROPLASTY);  Surgeon: Corky Mull, MD;  Location: [facility] ORS;  Service: Orthopedics;  Laterality: Right;  . LAPAROSCOPIC COLON RESECTION     2001    There were no vitals filed for this visit.  Subjective Assessment - 07/07/19 1108    Subjective  Patient reports continued soreness in BUE shoulders. She is concerned it could be related to cold weather. HEP is going well. No new falls.    Pertinent History  Ciniya Lardie is an 14yoF who comes to [facility] OPPT for. PMH: falls s/p Rt hip fracture and Rt HHA July 2019, COPD, asthma, CHF, hernia repair. chronic venous insufficiency, Right shoulder fracture June 2020 managed conservatively and PT 20x.Pt is referred to OPPT for ongoing balance issues and several falls over the past 2 years. Pt reports some difficulty managing her feet when walking.    How long can you sit comfortably?  N/A    How long can you stand comfortably?  N/A "I'm basically lazy, not veyr active so not limited"    How long can you walk comfortably?  Pt reports limited community  distances at baseline, but dependent on [facility] or fixed objects for stability.    Currently in Pain?  Yes    Pain Score  2     Pain Onset  1 to 4 weeks ago            TREATMENT: Warm up on Nustep BUE/BLE level 2 x4 min (Unbilled);  Patient instructed in advanced balance exercise   Resisted walking 12.5# forward/backward, side/side (4 way) x2 laps each with CGA for safety and cues to slow down eccentric return for better dynamic balance;   Gait in hallway: Forward walking with ball pass side/side x80 feet each direction Forward/backward walking with ball toss/catch x160 feet Patient exhibits slightly slower gait speed with ball pass due to difficulty of dual task. Instructed patient to increase  step length and increase gait speed with better dynamic balance.   Advanced HEP: Standing with green tband around BLE above knee: -slight squat with side stepping x10 feet x3 laps each direction Standing with green tband around BLE ankles: -forward/backward diagonal stepping x10 feet x2 laps each direction unsupported;  Patient required min VCs to avoid excessive step length to avoid strain on knee and to work on improving hip abduction for less knee valgus during stepping;   Figure 8 walking x3 laps unsupported with supervision for safety. Instructed patient to keep figure 8 large to avoid excessive tight turns for better safety and less unsteadiness.   Gait crossovers side stepping x10 feet x2 laps each direction with intermittent rail assist with cues to improve trunk mobility for less strain on LE. Patient had increased difficulty stepping back with LLE due to hip stiffness, this was improved with pelvic rotation;     Response to treatment: Tolerated well. Patient verbalized understanding of HEP.Advanced balance exercise with dynamic tasks. Patient required increased time and assistance with dynamic balance having difficulty with dual task.                       PT Education - 07/07/19 1221    Education Details  balance/HEP    Person(s) Educated  Patient    Methods  Explanation;Verbal cues;Handout    Comprehension  Verbalized understanding;Returned demonstration;Verbal cues required;Need further instruction       PT Short Term Goals - 06/20/19 1220      PT SHORT TERM GOAL #1   Title  After 4 weeks pt to demonstrate improved tolerance to 6MWT without foot scuffing LOB.    Baseline  2MWT at evaluation 0.9m/s (unable to go farther d/t back pain/fatigue)    Time  4    Period  Weeks    Status  New    Target Date  07/20/19      PT SHORT TERM GOAL #2   Title  After 4 weeks pt to demonstrate SLS>10sec bilat.    Baseline  At eval ~5-7sec bilat    Time  4     Period  Weeks    Status  New    Target Date  07/20/19        PT Long Term Goals - 06/20/19 1223      PT LONG TERM GOAL #1   Title  Pt to demonstrate improved SLS heel raise to >15 bilat without loss of height.    Baseline  Loss of height after 7x bilat    Time  8    Period  Weeks    Status  New    Target Date  08/19/18  PT LONG TERM GOAL #2   Title  Pt to demonstrate 5xSTS <11sec to demonstrate improved funcitonal ankle/hip strength and coordiantion.    Baseline  ~15sec at eval    Time  8    Period  Weeks    Status  New    Target Date  08/19/18      PT LONG TERM GOAL #3   Title  Pt to demonstrate improved SLS >25sec bilat    Baseline  ~5-7sec at eval    Time  8    Period  Weeks    Status  New    Target Date  08/19/00      PT LONG TERM GOAL #4   Title  Pt to demonstrate 4+/5 hip ABDCT strength bilat.    Baseline  4-/5 at eval    Time  8    Period  Weeks    Status  New    Target Date  08/19/18            Plan - 07/07/19 1222    Clinical Impression Statement  Patient motivated and participated well within session. Instructed patient in advanced dynamic balance tasks to challenge balance with walking. She does require supervision and cues to improve step length and erect posture for better dynamic balance. She did have difficulty with dual task often slowing down due to difficulty. Patient instructed in advanced HEP to challenge strength and balance. She did require min VCs for proper positioning and exercise technique to avoid strain on BLE knee. Patient tolerated well. She would benefit from additional skilled PT intervention to improve strength, balance and mobility;    Personal Factors and Comorbidities  Age;Behavior Pattern;Past/Current Experience;Fitness    Armed forces logistics/support/administrative officer;Reach Overhead;Bend;Locomotion Level;Lift    Examination-Participation Restrictions  Shop;Cleaning;Community Activity;Yard Work    Stability/Clinical Decision  Making  Stable/Uncomplicated    Rehab Potential  Good    PT Frequency  2x / week    PT Duration  8 weeks    PT Treatment/Interventions  ADLs/Self Care Home Management;Electrical Stimulation;Moist Heat;Stair training;Gait training;DME Instruction;Functional mobility training;Therapeutic activities;Therapeutic exercise;Balance training;Neuromuscular re-education;Patient/family education;Manual techniques;Passive range of motion;Dry needling;Vestibular;Taping    PT Next Visit Plan  Review goals, HEP; work on ankle strength, hip strength, and dynamic balance training    PT Home Exercise Plan  3x60sec heel strike AMB; 1x15 heel raises, 1x15 wall leaning dorsiflexion    Consulted and Agree with Plan of Care  Patient       Patient will benefit from skilled therapeutic intervention in order to improve the following deficits and impairments:  Abnormal gait, Decreased balance, Decreased endurance, Difficulty walking, Hypomobility, Increased muscle spasms, Decreased knowledge of precautions, Decreased range of motion, Decreased activity tolerance, Decreased coordination, Decreased knowledge of use of DME, Decreased strength, Postural dysfunction  Visit Diagnosis: Unsteadiness on feet  Stiffness of left ankle, not elsewhere classified     Problem List Patient Active Problem List   Diagnosis Date Noted  . Closed fracture of proximal end of right humerus 01/10/2019  . Chronic venous insufficiency 09/06/2018  . Lymphedema 09/06/2018  . GERD without esophagitis 03/10/2018  . Bilateral dry eyes 03/10/2018  . COPD (chronic obstructive pulmonary disease) ([place]) 03/04/2018  . Acute exacerbation of COPD with asthma ([place]) 02/22/2018  . Chronic anemia 02/22/2018  . Chronic constipation 02/22/2018  . Chronic bilateral thoracic back pain 02/22/2018  . Closed displaced fracture of right femoral neck ([place]) 02/22/2018  . Congenital heart failure ([place])   . Status post hip  hemiarthroplasty 02/12/2018  . Hip  fracture ([place]) 02/11/2018  . Asthma 04/30/2017  . Chronic reflux esophagitis 04/30/2017  . Hyperlipemia 04/30/2017  . Osteoporosis 04/30/2017  . Cardiac murmur, previously undiagnosed 04/30/2017  . Diverticulosis of small intestine without hemorrhage 04/30/2017  . Dyspnea on exertion 04/30/2017  . Hx of adenomatous colonic polyps 04/30/2017  . Hx of cardiomegaly 04/30/2017  . Lumbar arthropathy 04/30/2017  . Nasopharyngitis 04/30/2017  . Nonspecific chest pain 04/30/2017  . Osteoarthropathy 04/30/2017  . Leg swelling 04/30/2017  . Sinusitis, acute 04/30/2017  . History of bone density study 06/28/2004    Taiden Raybourn PT, DPT 07/07/2019, 12:24 PM  [place] MAIN [facility] SERVICES [address] [place], [place], [address] Phone: [phone]   Fax:  [phone]  Name: SHARY FENG MRN: [id number] Date of Birth: 04/13/1936

## 2019-07-08 DIAGNOSIS — G5600 Carpal tunnel syndrome, unspecified upper limb: Secondary | ICD-10-CM | POA: Diagnosis not present

## 2019-07-08 DIAGNOSIS — M47816 Spondylosis without myelopathy or radiculopathy, lumbar region: Secondary | ICD-10-CM | POA: Diagnosis not present

## 2019-07-08 DIAGNOSIS — E119 Type 2 diabetes mellitus without complications: Secondary | ICD-10-CM | POA: Diagnosis not present

## 2019-07-08 DIAGNOSIS — I10 Essential (primary) hypertension: Secondary | ICD-10-CM | POA: Diagnosis not present

## 2019-07-09 ENCOUNTER — Ambulatory Visit
Admission: RE | Admit: 2019-07-09 | Discharge: 2019-07-09 | Disposition: A | Discharge status: Home / Self Care | Payer: PPO | Source: Ambulatory Visit | Attending: Internal Medicine | Admitting: Internal Medicine

## 2019-07-09 ENCOUNTER — Other Ambulatory Visit: Payer: Self-pay | Admitting: Internal Medicine

## 2019-07-09 DIAGNOSIS — R05 Cough: Secondary | ICD-10-CM

## 2019-07-09 DIAGNOSIS — R059 Cough, unspecified: Secondary | ICD-10-CM

## 2019-07-11 ENCOUNTER — Encounter: Payer: Self-pay | Admitting: Physical Therapy

## 2019-07-11 ENCOUNTER — Other Ambulatory Visit: Payer: Self-pay

## 2019-07-11 ENCOUNTER — Ambulatory Visit: Payer: PPO

## 2019-07-11 DIAGNOSIS — R2681 Unsteadiness on feet: Secondary | ICD-10-CM | POA: Diagnosis not present

## 2019-07-11 DIAGNOSIS — M25672 Stiffness of left ankle, not elsewhere classified: Secondary | ICD-10-CM

## 2019-07-11 NOTE — Therapy (Signed)
[place] MAIN [facility] SERVICES [address] [place], [place], [address] Phone: [phone]   Fax:  [phone]  Physical Therapy Treatment  Patient Details  Name: Teresa Chambers MRN: [name] Date of Birth: 1936/06/24 No data recorded  Encounter Date: 07/11/2019  PT End of Session - 07/11/19 1139    Visit Number  7    Number of Visits  16    Date for PT Re-Evaluation  08/19/18    Authorization Type  HTA medicare    Authorization Time Period  06/20/19-08/19/18    Authorization - Visit Number  7    Authorization - Number of Visits  10    PT Start Time  1140    PT Stop Time  1225    PT Time Calculation (min)  45 min    Equipment Utilized During Treatment  Gait belt    Activity Tolerance  Patient tolerated treatment well;No increased pain       Past Medical History:  Diagnosis Date  . Acute thoracic back pain   . Asthma   . Chest pain    unspecified  . CHF (congestive heart failure) ([place]) 04/30/2017  . Chronic abdominal pain 05/01/2017   unspecified  . Congenital heart failure ([place])   . COPD (chronic obstructive pulmonary disease) ([place])   . Disease of upper respiratory system 04/30/2017  . Dyspnea on exertion   . Esophageal reflux disease 04/30/2017  . History of bone density study 06/28/2004  . Leg swelling   . Lumbar arthropathy   . Nasopharyngitis 04/30/2017  . Osteoarthropathy 04/30/2017  . Osteoporosis 04/30/2017  . Palpitations   . Sinusitis, acute 04/30/2017   unspecified    Past Surgical History:  Procedure Laterality Date  . CATARACT EXTRACTION     07/21/2001 - 07/20/2002  . CHOLECYSTECTOMY     07/21/1997 - 07/20/1998  . COLON SURGERY    . COLONOSCOPY     05/05/2000, 01/17/2000 Adenomatous Polyps   . COLONOSCOPY     11/05/2009, 12/23/2004, 07/07/2001   PH Adenomatous Polyps: CBF 10/2014; recall ltr mailed 09/18/2014 (dw)  . ESOPHAGOGASTRODUODENOSCOPY     11/05/2009, 05/05/2000  . FLEXIBLE SIGMOIDOSCOPY   11/28/1999  . HAND SURGERY  2006  . HERNIA REPAIR    . HIP ARTHROPLASTY Right 02/11/2018   Procedure: ARTHROPLASTY BIPOLAR HIP (HEMIARTHROPLASTY);  Surgeon: Corky Mull, MD;  Location: [facility] ORS;  Service: Orthopedics;  Laterality: Right;  . LAPAROSCOPIC COLON RESECTION     2001    There were no vitals filed for this visit.  Subjective Assessment - 07/11/19 1143    Subjective  Patient reported chronic pain in shoulders and back. Stated that one of her dogs slept with her last night so she didn't enough sleep. No reported falls/stumbles since last visit.    Pertinent History  Teresa Chambers is an 31yoF who comes to [facility] OPPT for. PMH: falls s/p Rt hip fracture and Rt HHA July 2019, COPD, asthma, CHF, hernia repair. chronic venous insufficiency, Right shoulder fracture June 2020 managed conservatively and PT 20x.Pt is referred to OPPT for ongoing balance issues and several falls over the past 2 years. Pt reports some difficulty managing her feet when walking.    How long can you sit comfortably?  N/A    How long can you stand comfortably?  N/A "I'm basically lazy, not veyr active so not limited"    How long can you walk comfortably?  Pt reports limited community distances at baseline, but dependent  on [facility] or fixed objects for stability.    Currently in Pain?  No/denies         TREATMENT: Warm up on Nustep BUE/BLE level 2 x4 min (Unbilled);               Patient instructed in advanced balance exercise Resisted walking 12.5# forward/backward, side/side (4 way) x2 laps each with CGA for safety and cues to slow down eccentric return for better dynamic balance; Patient complained of L knee pain, "locking" with eccentric control returning to bar with R leg leading   Gait in hallway: Forward walking with ball pass side/side x80 feet each direction Forward/backward walking with ball toss/catch x160 feet Forward walkingBall up/down/R/L with eye follow Patient exhibits slightly slower gait speed  with ball pass due to difficulty of dual task. Instructed patient to increase step length and increase gait speed with better dynamic balance.    Figure 8 walking x3 laps unsupported with supervision for safety. Instructed patient to keep figure 8 large to avoid excessive tight turns for better safety and less unsteadiness.    Gait crossovers side stepping x15 feet x2 laps. Patient had increased difficulty stepping back with LLE due to hip stiffness, this was improved with pelvic rotation;   Step tapsto 6" step x10 without rail assist Step taps on to 6" step from foam x10 ea without rail support Lateral step taps from foam to 6" step witout rail support x10 Foam and single foot up on step 3x30sec ea   Response to treatment/clinical impression: Patient highly motivated throughout session, several standing rest breaks needed but no seated breaks. Patient most challenged by eccentric control of LLE. The patient reported L knee "locking" during an exercise today that resolved after ending the specific exercise. The patient would benefit from further skilled PT intervention to maximize mobility, safety, and independence.    PT Education - 07/11/19 1139    Education Details  balance, therapeutic exercise/form    Person(s) Educated  Patient    Methods  Explanation;Verbal cues;Handout    Comprehension  Verbalized understanding;Returned demonstration;Verbal cues required;Need further instruction       PT Short Term Goals - 06/20/19 1220      PT SHORT TERM GOAL #1   Title  After 4 weeks pt to demonstrate improved tolerance to 6MWT without foot scuffing LOB.    Baseline  2MWT at evaluation 0.49m/s (unable to go farther d/t back pain/fatigue)    Time  4    Period  Weeks    Status  New    Target Date  07/20/19      PT SHORT TERM GOAL #2   Title  After 4 weeks pt to demonstrate SLS>10sec bilat.    Baseline  At eval ~5-7sec bilat    Time  4    Period  Weeks    Status  New    Target Date   07/20/19        PT Long Term Goals - 06/20/19 1223      PT LONG TERM GOAL #1   Title  Pt to demonstrate improved SLS heel raise to >15 bilat without loss of height.    Baseline  Loss of height after 7x bilat    Time  8    Period  Weeks    Status  New    Target Date  08/19/18      PT LONG TERM GOAL #2   Title  Pt to demonstrate 5xSTS <11sec to demonstrate improved  funcitonal ankle/hip strength and coordiantion.    Baseline  ~15sec at eval    Time  8    Period  Weeks    Status  New    Target Date  08/19/18      PT LONG TERM GOAL #3   Title  Pt to demonstrate improved SLS >25sec bilat    Baseline  ~5-7sec at eval    Time  8    Period  Weeks    Status  New    Target Date  08/19/00      PT LONG TERM GOAL #4   Title  Pt to demonstrate 4+/5 hip ABDCT strength bilat.    Baseline  4-/5 at eval    Time  8    Period  Weeks    Status  New    Target Date  08/19/18            Plan - 07/11/19 1144    Clinical Impression Statement  Patient highly motivated throughout session, several standing rest breaks needed but no seated breaks. Patient most challenged by eccentric control of LLE. The patient reported L knee "locking" during an exercise today that resolved after ending the specific exercise. The patient would benefit from further skilled PT intervention to maximize mobility, safety, and independence.    Personal Factors and Comorbidities  Age;Behavior Pattern;Past/Current Experience;Fitness    Armed forces logistics/support/administrative officer;Reach Overhead;Bend;Locomotion Level;Lift    Examination-Participation Restrictions  Shop;Cleaning;Community Activity;Yard Work    Stability/Clinical Decision Making  Stable/Uncomplicated    Rehab Potential  Good    PT Frequency  2x / week    PT Duration  8 weeks    PT Treatment/Interventions  ADLs/Self Care Home Management;Electrical Stimulation;Moist Heat;Stair training;Gait training;DME Instruction;Functional mobility  training;Therapeutic activities;Therapeutic exercise;Balance training;Neuromuscular re-education;Patient/family education;Manual techniques;Passive range of motion;Dry needling;Vestibular;Taping    PT Next Visit Plan  Review goals, HEP; work on ankle strength, hip strength, and dynamic balance training    PT Home Exercise Plan  3x60sec heel strike AMB; 1x15 heel raises, 1x15 wall leaning dorsiflexion    Consulted and Agree with Plan of Care  Patient       Patient will benefit from skilled therapeutic intervention in order to improve the following deficits and impairments:  Abnormal gait, Decreased balance, Decreased endurance, Difficulty walking, Hypomobility, Increased muscle spasms, Decreased knowledge of precautions, Decreased range of motion, Decreased activity tolerance, Decreased coordination, Decreased knowledge of use of DME, Decreased strength, Postural dysfunction  Visit Diagnosis: Unsteadiness on feet  Stiffness of left ankle, not elsewhere classified     Problem List Patient Active Problem List   Diagnosis Date Noted  . Closed fracture of proximal end of right humerus 01/10/2019  . Chronic venous insufficiency 09/06/2018  . Lymphedema 09/06/2018  . GERD without esophagitis 03/10/2018  . Bilateral dry eyes 03/10/2018  . COPD (chronic obstructive pulmonary disease) ([place]) 03/04/2018  . Acute exacerbation of COPD with asthma ([place]) 02/22/2018  . Chronic anemia 02/22/2018  . Chronic constipation 02/22/2018  . Chronic bilateral thoracic back pain 02/22/2018  . Closed displaced fracture of right femoral neck ([place]) 02/22/2018  . Congenital heart failure ([place])   . Status post hip hemiarthroplasty 02/12/2018  . Hip fracture ([place]) 02/11/2018  . Asthma 04/30/2017  . Chronic reflux esophagitis 04/30/2017  . Hyperlipemia 04/30/2017  . Osteoporosis 04/30/2017  . Cardiac murmur, previously undiagnosed 04/30/2017  . Diverticulosis of small intestine without hemorrhage 04/30/2017  .  Dyspnea on exertion 04/30/2017  . Hx of adenomatous colonic polyps  04/30/2017  . Hx of cardiomegaly 04/30/2017  . Lumbar arthropathy 04/30/2017  . Nasopharyngitis 04/30/2017  . Nonspecific chest pain 04/30/2017  . Osteoarthropathy 04/30/2017  . Leg swelling 04/30/2017  . Sinusitis, acute 04/30/2017  . History of bone density study 06/28/2004    Lieutenant Diego PT, DPT 12:56 PM,07/11/19   [place] MAIN [facility] SERVICES [address] [place], [place], [address] Phone: [phone]   Fax:  [phone]  Name: Teresa Chambers MRN: [id number] Date of Birth: [birth year]

## 2019-07-18 ENCOUNTER — Other Ambulatory Visit: Payer: Self-pay

## 2019-07-18 ENCOUNTER — Ambulatory Visit: Payer: PPO | Admitting: Physical Therapy

## 2019-07-18 ENCOUNTER — Encounter: Payer: Self-pay | Admitting: Physical Therapy

## 2019-07-18 DIAGNOSIS — R2681 Unsteadiness on feet: Secondary | ICD-10-CM | POA: Diagnosis not present

## 2019-07-18 DIAGNOSIS — M25672 Stiffness of left ankle, not elsewhere classified: Secondary | ICD-10-CM

## 2019-07-18 NOTE — Therapy (Signed)
[place] MAIN [facility] SERVICES [address] [place], [place], [address] Phone: [phone]   Fax:  [phone]  Physical Therapy Treatment  Patient Details  Name: Teresa Chambers MRN: [name] Date of Birth: [birth year] No data recorded  Encounter Date: 07/18/2019  PT End of Session - 07/18/19 1138    Visit Number  8    Number of Visits  16    Date for PT Re-Evaluation  08/19/18    Authorization Type  HTA medicare    Authorization Time Period  06/20/19-08/19/18    Authorization - Visit Number  8    Authorization - Number of Visits  10    PT Start Time  1139    PT Stop Time  1225    PT Time Calculation (min)  46 min    Equipment Utilized During Treatment  Gait belt    Activity Tolerance  Patient tolerated treatment well;No increased pain    Behavior During Therapy  WFL for tasks assessed/performed       Past Medical History:  Diagnosis Date  . Acute thoracic back pain   . Asthma   . Chest pain    unspecified  . CHF (congestive heart failure) ([place]) 04/30/2017  . Chronic abdominal pain 05/01/2017   unspecified  . Congenital heart failure ([place])   . COPD (chronic obstructive pulmonary disease) ([place])   . Disease of upper respiratory system 04/30/2017  . Dyspnea on exertion   . Esophageal reflux disease 04/30/2017  . History of bone density study 06/28/2004  . Leg swelling   . Lumbar arthropathy   . Nasopharyngitis 04/30/2017  . Osteoarthropathy 04/30/2017  . Osteoporosis 04/30/2017  . Palpitations   . Sinusitis, acute 04/30/2017   unspecified    Past Surgical History:  Procedure Laterality Date  . CATARACT EXTRACTION     07/21/2001 - 07/20/2002  . CHOLECYSTECTOMY     07/21/1997 - 07/20/1998  . COLON SURGERY    . COLONOSCOPY     05/05/2000, 01/17/2000 Adenomatous Polyps   . COLONOSCOPY     11/05/2009, 12/23/2004, 07/07/2001   PH Adenomatous Polyps: CBF 10/2014; recall ltr mailed 09/18/2014 (dw)  . ESOPHAGOGASTRODUODENOSCOPY      11/05/2009, 05/05/2000  . FLEXIBLE SIGMOIDOSCOPY  11/28/1999  . HAND SURGERY  2006  . HERNIA REPAIR    . HIP ARTHROPLASTY Right 02/11/2018   Procedure: ARTHROPLASTY BIPOLAR HIP (HEMIARTHROPLASTY);  Surgeon: Corky Mull, MD;  Location: [facility] ORS;  Service: Orthopedics;  Laterality: Right;  . LAPAROSCOPIC COLON RESECTION     2001    There were no vitals filed for this visit.  Subjective Assessment - 07/18/19 1142    Subjective  Patient reports doing well. She reports her shoulders have been feeling better. She reports adherence with HEP and states that they have been working well.    Pertinent History  Teresa Chambers is an 49yoF who comes to [facility] OPPT for. PMH: falls s/p Rt hip fracture and Rt HHA July 2019, COPD, asthma, CHF, hernia repair. chronic venous insufficiency, Right shoulder fracture June 2020 managed conservatively and PT 20x.Pt is referred to OPPT for ongoing balance issues and several falls over the past 2 years. Pt reports some difficulty managing her feet when walking.    How long can you sit comfortably?  N/A    How long can you stand comfortably?  N/A "I'm basically lazy, not veyr active so not limited"    How long can you walk comfortably?  Pt reports limited  community distances at baseline, but dependent on [name] or fixed objects for stability.    Currently in Pain?  No/denies    Multiple Pain Sites  No          TREATMENT: Warm up on Nustep BUE/BLE level 2 x4 min (Unbilled);  Patient instructed in advanced balance exercise   Resisted walking 17.5# forward/backward, side/side (4 way) x2 laps each with CGA for safety and cues to slow down eccentric return for better dynamic balance;   Gait crossovers side stepping x10 feet x4 laps each direction with intermittent rail assist with cues to improve trunk mobility for less strain on LE. Patient had increased difficulty stepping back with LLE due to hip stiffness, this was improved with pelvic rotation;     Patient instructed in advanced balance exercise  Standing in parallel bars:  Standing on 1/2 foam: (Flat side up) -heel/toe rocks with feet apart heel/toe rocks x15 with rail assist for safety -feet apart, BUE wand flexion x10 reps with CGA for safety and cues to improve ankle strategies for better stance control -tandem stance with 2-0 rail assist  With holding wand vertically and rotating side/side x2 reps, approximately 10 sec hold x2 reps each foot in front with CGA to min A for safety and cues to improve erect posture and increase weight shift for better stance control  Stepping over 1/2 bolster and orange hurdle: Forward/backward with 1-0 rail assist x10 reps reciprocal stepping with cues to increase momentum for better dynamic balance;  Side step with 1-0 rail assist x4 reps each direction Required mod VCs to increase hip flexion and increase step length for better foot clearance; patient did report increased knee discomfort with side stepping often rotating foot to avoid hip abduction for less strain in valgus position;   Instructed patient in SLS on firm surface: Averages 6-12 sec each LE x4 reps each with cues for proper foot placement and to improve erect posture for better stance control;   Response to treatment: Tolerated well. Patient verbalized understanding of HEP.Advanced balance exercise with dynamic tasks. Patient required CGA to min A with advanced balance tasks especially with reduced rail assist. She denies any increase in pain at end of session.                         PT Education - 07/18/19 1138    Education Details  balance/ther ex, HEP    Person(s) Educated  Patient    Methods  Explanation;Verbal cues    Comprehension  Verbalized understanding;Returned demonstration;Verbal cues required;Need further instruction       PT Short Term Goals - 06/20/19 1220      PT SHORT TERM GOAL #1   Title  After 4 weeks pt to demonstrate improved  tolerance to 6MWT without foot scuffing LOB.    Baseline  2MWT at evaluation 0.37m/s (unable to go farther d/t back pain/fatigue)    Time  4    Period  Weeks    Status  New    Target Date  07/20/19      PT SHORT TERM GOAL #2   Title  After 4 weeks pt to demonstrate SLS>10sec bilat.    Baseline  At eval ~5-7sec bilat    Time  4    Period  Weeks    Status  New    Target Date  07/20/19        PT Long Term Goals - 06/20/19 1223  PT LONG TERM GOAL #1   Title  Pt to demonstrate improved SLS heel raise to >15 bilat without loss of height.    Baseline  Loss of height after 7x bilat    Time  8    Period  Weeks    Status  New    Target Date  08/19/18      PT LONG TERM GOAL #2   Title  Pt to demonstrate 5xSTS <11sec to demonstrate improved funcitonal ankle/hip strength and coordiantion.    Baseline  ~15sec at eval    Time  8    Period  Weeks    Status  New    Target Date  08/19/18      PT LONG TERM GOAL #3   Title  Pt to demonstrate improved SLS >25sec bilat    Baseline  ~5-7sec at eval    Time  8    Period  Weeks    Status  New    Target Date  08/19/00      PT LONG TERM GOAL #4   Title  Pt to demonstrate 4+/5 hip ABDCT strength bilat.    Baseline  4-/5 at eval    Time  8    Period  Weeks    Status  New    Target Date  08/19/18            Plan - 07/18/19 1253    Clinical Impression Statement  Patient motivated throughout session. She was re-instructed in gait crossovers. patient had increased difficulty stepping to right with decreased LLE hip adduction. Patient required cues for proper weight shift and to improve step length for better foot clearance. She did  have difficulty reducing rail assist with stepping over obstacles. Patient instructed in SLS as part of HEP to improve stance balance. She required CGA to min A with most balance exercise especially with reduced rail assist. She would benefit from additional skilled PT intervention to improve strength,  balance and mobility;    Personal Factors and Comorbidities  Age;Behavior Pattern;Past/Current Experience;Fitness    Armed forces logistics/support/administrative officer;Reach Overhead;Bend;Locomotion Level;Lift    Examination-Participation Restrictions  Shop;Cleaning;Community Activity;Yard Work    Stability/Clinical Decision Making  Stable/Uncomplicated    Rehab Potential  Good    PT Frequency  2x / week    PT Duration  8 weeks    PT Treatment/Interventions  ADLs/Self Care Home Management;Electrical Stimulation;Moist Heat;Stair training;Gait training;DME Instruction;Functional mobility training;Therapeutic activities;Therapeutic exercise;Balance training;Neuromuscular re-education;Patient/family education;Manual techniques;Passive range of motion;Dry needling;Vestibular;Taping    PT Next Visit Plan  Review goals, HEP; work on ankle strength, hip strength, and dynamic balance training    PT Home Exercise Plan  3x60sec heel strike AMB; 1x15 heel raises, 1x15 wall leaning dorsiflexion    Consulted and Agree with Plan of Care  Patient       Patient will benefit from skilled therapeutic intervention in order to improve the following deficits and impairments:  Abnormal gait, Decreased balance, Decreased endurance, Difficulty walking, Hypomobility, Increased muscle spasms, Decreased knowledge of precautions, Decreased range of motion, Decreased activity tolerance, Decreased coordination, Decreased knowledge of use of DME, Decreased strength, Postural dysfunction  Visit Diagnosis: Unsteadiness on feet  Stiffness of left ankle, not elsewhere classified     Problem List Patient Active Problem List   Diagnosis Date Noted  . Closed fracture of proximal end of right humerus 01/10/2019  . Chronic venous insufficiency 09/06/2018  . Lymphedema 09/06/2018  . GERD without esophagitis 03/10/2018  . Bilateral dry  eyes 03/10/2018  . COPD (chronic obstructive pulmonary disease) ([place]) 03/04/2018  . Acute  exacerbation of COPD with asthma ([place]) 02/22/2018  . Chronic anemia 02/22/2018  . Chronic constipation 02/22/2018  . Chronic bilateral thoracic back pain 02/22/2018  . Closed displaced fracture of right femoral neck ([place]) 02/22/2018  . Congenital heart failure ([place])   . Status post hip hemiarthroplasty 02/12/2018  . Hip fracture ([place]) 02/11/2018  . Asthma 04/30/2017  . Chronic reflux esophagitis 04/30/2017  . Hyperlipemia 04/30/2017  . Osteoporosis 04/30/2017  . Cardiac murmur, previously undiagnosed 04/30/2017  . Diverticulosis of small intestine without hemorrhage 04/30/2017  . Dyspnea on exertion 04/30/2017  . Hx of adenomatous colonic polyps 04/30/2017  . Hx of cardiomegaly 04/30/2017  . Lumbar arthropathy 04/30/2017  . Nasopharyngitis 04/30/2017  . Nonspecific chest pain 04/30/2017  . Osteoarthropathy 04/30/2017  . Leg swelling 04/30/2017  . Sinusitis, acute 04/30/2017  . History of bone density study 06/28/2004    Atisha Hamidi PT, DPT 07/18/2019, 12:56 PM  [place] MAIN [facility] SERVICES [address] [place], [place], [address] Phone: [phone]   Fax:  [phone]  Name: Teresa Chambers MRN: [id number] Date of Birth: 1935/10/04

## 2019-07-18 NOTE — Patient Instructions (Signed)
    Attempt to balance on left leg, eyes open. Hold _5-10___ seconds.Start with holding onto counter and if you get your balance you can try to let go of counter. Repeat __5__ times per set. Do __1__ sets per session. Do __1__ sessions per day. Keep eyes open:   http://orth.exer.us/29   Copyright  VHI. All rights reserved.

## 2019-07-20 ENCOUNTER — Ambulatory Visit: Payer: PPO

## 2019-07-20 ENCOUNTER — Other Ambulatory Visit: Payer: Self-pay

## 2019-07-20 DIAGNOSIS — R2681 Unsteadiness on feet: Secondary | ICD-10-CM

## 2019-07-20 DIAGNOSIS — M25672 Stiffness of left ankle, not elsewhere classified: Secondary | ICD-10-CM

## 2019-07-20 NOTE — Therapy (Signed)
[place] MAIN [facility] SERVICES [address] [place], [place], [address] Phone: [phone]   Fax:  [phone]  Physical Therapy Treatment  Patient Details  Name: Teresa Chambers MRN: [name] Date of Birth: 1936-03-06 No data recorded  Encounter Date: 07/20/2019  PT End of Session - 07/20/19 1149    Visit Number  9    Number of Visits  16    Date for PT Re-Evaluation  08/19/18    Authorization Type  HTA medicare    Authorization Time Period  06/20/19-08/19/18    Authorization - Visit Number  9    Authorization - Number of Visits  10    PT Start Time  1140    PT Stop Time  1225    PT Time Calculation (min)  45 min    Equipment Utilized During Treatment  Gait belt    Activity Tolerance  Patient tolerated treatment well;No increased pain    Behavior During Therapy  WFL for tasks assessed/performed       Past Medical History:  Diagnosis Date  . Acute thoracic back pain   . Asthma   . Chest pain    unspecified  . CHF (congestive heart failure) ([place]) 04/30/2017  . Chronic abdominal pain 05/01/2017   unspecified  . Congenital heart failure ([place])   . COPD (chronic obstructive pulmonary disease) ([place])   . Disease of upper respiratory system 04/30/2017  . Dyspnea on exertion   . Esophageal reflux disease 04/30/2017  . History of bone density study 06/28/2004  . Leg swelling   . Lumbar arthropathy   . Nasopharyngitis 04/30/2017  . Osteoarthropathy 04/30/2017  . Osteoporosis 04/30/2017  . Palpitations   . Sinusitis, acute 04/30/2017   unspecified    Past Surgical History:  Procedure Laterality Date  . CATARACT EXTRACTION     07/21/2001 - 07/20/2002  . CHOLECYSTECTOMY     07/21/1997 - 07/20/1998  . COLON SURGERY    . COLONOSCOPY     05/05/2000, 01/17/2000 Adenomatous Polyps   . COLONOSCOPY     11/05/2009, 12/23/2004, 07/07/2001   PH Adenomatous Polyps: CBF 10/2014; recall ltr mailed 09/18/2014 (dw)  . ESOPHAGOGASTRODUODENOSCOPY      11/05/2009, 05/05/2000  . FLEXIBLE SIGMOIDOSCOPY  11/28/1999  . HAND SURGERY  2006  . HERNIA REPAIR    . HIP ARTHROPLASTY Right 02/11/2018   Procedure: ARTHROPLASTY BIPOLAR HIP (HEMIARTHROPLASTY);  Surgeon: Corky Mull, MD;  Location: [facility] ORS;  Service: Orthopedics;  Laterality: Right;  . LAPAROSCOPIC COLON RESECTION     2001    There were no vitals filed for this visit.  Subjective Assessment - 07/20/19 1147    Subjective  Patient reports compliance with HEP. Was down on the ground to plug something in and had difficulty getting up. Was able to pull herself up holding onto the bed.    Pertinent History  Teresa Chambers is an 63yoF who comes to [facility] OPPT for. PMH: falls s/p Rt hip fracture and Rt HHA July 2019, COPD, asthma, CHF, hernia repair. chronic venous insufficiency, Right shoulder fracture June 2020 managed conservatively and PT 20x.Pt is referred to OPPT for ongoing balance issues and several falls over the past 2 years. Pt reports some difficulty managing her feet when walking.    How long can you sit comfortably?  N/A    How long can you stand comfortably?  N/A "I'm basically lazy, not veyr active so not limited"    How long can you walk comfortably?  Pt reports limited community distances at baseline, but dependent on [facility] or fixed objects for stability.    Currently in Pain?  No/denies               TREATMENT: Warm up on Nustep BUE/BLE level 2 x4 min (Unbilled);    Patient instructed in advanced balance exercise: exhausting to patient to perform requiring standing rest breaks between interventions to catch breath.      Diona Foley raises in hallway with cue to follow ball with head 160 ft, CGA required Ball toss; challenging for patient to catch and throw with dual task of ambulation, decreased foot clearance, CGA required 160 ft; very fatiguing to patient Ball horizontal twists with head follow 160 ft, occasional right veering requiring CGA.   Crossover technique with  cueing for knee flexion to allow for improved posterior step 2x 10 ft      Patient instructed in advanced balance exercise   Standing in parallel bars:  Modified lunge to mimic standing up from ground, painful to L knee terminated due to pain.    Standing on 1/2 foam: (Flat side up) -heel/toe rocks with feet apart heel/toe rocks x15 with rail assist for safety  airex pad: one foot on 6" step, one foot an airex pad: static stand 45 seconds x 2 trials each LE, very challenging initially but improved with repetition.  airex pad: toe taps no UE support on 6" step 12x each LE   Modified single limb stance: one foot on soccer ball 30-40 seconds each LE, no UE support.    Response to treatment: Tolerated well. Patient verbalized understanding of HEP. Advanced balance exercise with dynamic tasks. Patient required CGA to min A with advanced balance tasks especially with reduced rail assist. She denies any increase in pain at end of session.                      PT Education - 07/20/19 1149    Education Details  exercise technique, body mechanics    Person(s) Educated  Patient    Methods  Explanation;Demonstration;Tactile cues;Verbal cues    Comprehension  Verbalized understanding;Returned demonstration;Verbal cues required;Tactile cues required       PT Short Term Goals - 06/20/19 1220      PT SHORT TERM GOAL #1   Title  After 4 weeks pt to demonstrate improved tolerance to 6MWT without foot scuffing LOB.    Baseline  2MWT at evaluation 0.37m/s (unable to go farther d/t back pain/fatigue)    Time  4    Period  Weeks    Status  New    Target Date  07/20/19      PT SHORT TERM GOAL #2   Title  After 4 weeks pt to demonstrate SLS>10sec bilat.    Baseline  At eval ~5-7sec bilat    Time  4    Period  Weeks    Status  New    Target Date  07/20/19        PT Long Term Goals - 06/20/19 1223      PT LONG TERM GOAL #1   Title  Pt to demonstrate improved SLS heel raise  to >15 bilat without loss of height.    Baseline  Loss of height after 7x bilat    Time  8    Period  Weeks    Status  New    Target Date  08/19/18      PT LONG TERM GOAL #  2   Title  Pt to demonstrate 5xSTS <11sec to demonstrate improved funcitonal ankle/hip strength and coordiantion.    Baseline  ~15sec at eval    Time  8    Period  Weeks    Status  New    Target Date  08/19/18      PT LONG TERM GOAL #3   Title  Pt to demonstrate improved SLS >25sec bilat    Baseline  ~5-7sec at eval    Time  8    Period  Weeks    Status  New    Target Date  08/19/00      PT LONG TERM GOAL #4   Title  Pt to demonstrate 4+/5 hip ABDCT strength bilat.    Baseline  4-/5 at eval    Time  8    Period  Weeks    Status  New    Target Date  08/19/18            Plan - 07/20/19 1250    Clinical Impression Statement  Patient presents with excellent motivation throughout physical therapy session. She is very challenged with dual task ambulation with degradation of foot clearance. Unstable surfaces require additional ankle righting reactions which is difficult for patient initially but improved with repetition. She would benefit from additional skilled PT intervention to improve strength, balance and mobility;    Personal Factors and Comorbidities  Age;Behavior Pattern;Past/Current Experience;Fitness    Armed forces logistics/support/administrative officer;Reach Overhead;Bend;Locomotion Level;Lift    Examination-Participation Restrictions  Shop;Cleaning;Community Activity;Yard Work    Stability/Clinical Decision Making  Stable/Uncomplicated    Rehab Potential  Good    PT Frequency  2x / week    PT Duration  8 weeks    PT Treatment/Interventions  ADLs/Self Care Home Management;Electrical Stimulation;Moist Heat;Stair training;Gait training;DME Instruction;Functional mobility training;Therapeutic activities;Therapeutic exercise;Balance training;Neuromuscular re-education;Patient/family education;Manual  techniques;Passive range of motion;Dry needling;Vestibular;Taping    PT Next Visit Plan  Review goals, HEP; work on ankle strength, hip strength, and dynamic balance training    PT Home Exercise Plan  3x60sec heel strike AMB; 1x15 heel raises, 1x15 wall leaning dorsiflexion    Consulted and Agree with Plan of Care  Patient       Patient will benefit from skilled therapeutic intervention in order to improve the following deficits and impairments:  Abnormal gait, Decreased balance, Decreased endurance, Difficulty walking, Hypomobility, Increased muscle spasms, Decreased knowledge of precautions, Decreased range of motion, Decreased activity tolerance, Decreased coordination, Decreased knowledge of use of DME, Decreased strength, Postural dysfunction  Visit Diagnosis: Unsteadiness on feet  Stiffness of left ankle, not elsewhere classified     Problem List Patient Active Problem List   Diagnosis Date Noted  . Closed fracture of proximal end of right humerus 01/10/2019  . Chronic venous insufficiency 09/06/2018  . Lymphedema 09/06/2018  . GERD without esophagitis 03/10/2018  . Bilateral dry eyes 03/10/2018  . COPD (chronic obstructive pulmonary disease) ([place]) 03/04/2018  . Acute exacerbation of COPD with asthma ([place]) 02/22/2018  . Chronic anemia 02/22/2018  . Chronic constipation 02/22/2018  . Chronic bilateral thoracic back pain 02/22/2018  . Closed displaced fracture of right femoral neck ([place]) 02/22/2018  . Congenital heart failure ([place])   . Status post hip hemiarthroplasty 02/12/2018  . Hip fracture ([place]) 02/11/2018  . Asthma 04/30/2017  . Chronic reflux esophagitis 04/30/2017  . Hyperlipemia 04/30/2017  . Osteoporosis 04/30/2017  . Cardiac murmur, previously undiagnosed 04/30/2017  . Diverticulosis of small intestine without hemorrhage 04/30/2017  .  Dyspnea on exertion 04/30/2017  . Hx of adenomatous colonic polyps 04/30/2017  . Hx of cardiomegaly 04/30/2017  . Lumbar  arthropathy 04/30/2017  . Nasopharyngitis 04/30/2017  . Nonspecific chest pain 04/30/2017  . Osteoarthropathy 04/30/2017  . Leg swelling 04/30/2017  . Sinusitis, acute 04/30/2017  . History of bone density study 06/28/2004   Teresa Chambers, PT, DPT   07/20/2019, 12:52 PM  [place] MAIN [facility] SERVICES [address] [place], [place], [address] Phone: [phone]   Fax:  [phone]  Name: Teresa Chambers MRN: [id number] Date of Birth: 01/18/1936

## 2019-07-25 ENCOUNTER — Ambulatory Visit: Payer: PPO | Attending: Internal Medicine

## 2019-07-25 ENCOUNTER — Other Ambulatory Visit: Payer: Self-pay

## 2019-07-25 DIAGNOSIS — M25672 Stiffness of left ankle, not elsewhere classified: Secondary | ICD-10-CM | POA: Diagnosis not present

## 2019-07-25 DIAGNOSIS — R2681 Unsteadiness on feet: Secondary | ICD-10-CM | POA: Insufficient documentation

## 2019-07-25 NOTE — Therapy (Signed)
[place] MAIN [facility] SERVICES [address] [place], [place], [address] Phone: [phone]   Fax:  [phone]  Physical Therapy Treatment Physical Therapy Progress Note   Dates of reporting period  06/20/19   to   07/25/19   Patient Details  Name: Teresa Chambers MRN: [id number] Date of Birth: [birth year] No data recorded  Encounter Date: 07/25/2019  PT End of Session - 07/25/19 1209    Visit Number  10    Number of Visits  16    Date for PT Re-Evaluation  08/19/18    Authorization Type  [employer] medicare    Authorization Time Period  06/20/19-08/20/19    Authorization - Visit Number  10    Authorization - Number of Visits  10    PT Start Time  1146    PT Stop Time  1226    PT Time Calculation (min)  40 min    Equipment Utilized During Treatment  Gait belt    Activity Tolerance  Patient tolerated treatment well;No increased pain    Behavior During Therapy  WFL for tasks assessed/performed       Past Medical History:  Diagnosis Date  . Acute thoracic back pain   . Asthma   . Chest pain    unspecified  . CHF (congestive heart failure) ([place]) 04/30/2017  . Chronic abdominal pain 05/01/2017   unspecified  . Congenital heart failure ([place])   . COPD (chronic obstructive pulmonary disease) ([place])   . Disease of upper respiratory system 04/30/2017  . Dyspnea on exertion   . Esophageal reflux disease 04/30/2017  . History of bone density study 06/28/2004  . Leg swelling   . Lumbar arthropathy   . Nasopharyngitis 04/30/2017  . Osteoarthropathy 04/30/2017  . Osteoporosis 04/30/2017  . Palpitations   . Sinusitis, acute 04/30/2017   unspecified    Past Surgical History:  Procedure Laterality Date  . CATARACT EXTRACTION     07/21/2001 - 07/20/2002  . CHOLECYSTECTOMY     07/21/1997 - 07/20/1998  . COLON SURGERY    . COLONOSCOPY     05/05/2000, 01/17/2000 Adenomatous Polyps   . COLONOSCOPY     11/05/2009, 12/23/2004, 07/07/2001   PH  Adenomatous Polyps: CBF 10/2014; recall ltr mailed 09/18/2014 (dw)  . ESOPHAGOGASTRODUODENOSCOPY     11/05/2009, 05/05/2000  . FLEXIBLE SIGMOIDOSCOPY  11/28/1999  . HAND SURGERY  2006  . HERNIA REPAIR    . HIP ARTHROPLASTY Right 02/11/2018   Procedure: ARTHROPLASTY BIPOLAR HIP (HEMIARTHROPLASTY);  Surgeon: Corky Mull, MD;  Location: [facility] ORS;  Service: Orthopedics;  Laterality: Right;  . LAPAROSCOPIC COLON RESECTION     2001    There were no vitals filed for this visit.  REASSESSMENT POINTS    5XSTS: 14.98sec hands on knees, chair+ airex (unable to rise from chair hands free), bilat knee valgus/contact throughout with maintained flexed knee posture; no LOB, pt denies instability. -At visit 10, 15.85sec (near identical mechanics as first performance)   10MWT:  self selected-> 0.44ms  fastest AMB-> 1.21m *bilat structural genu valgus with knock kneed AMB and wide feet compensation, mild control deficits of lumbopelvis in swing phase.  _0  visit-> 9.14sec or 1.0990m   2MWT: [id number] 0.74m/74mo AD, tired, SOB and low back pain, no LOB -At visit 10: [id number] 0.50m/s;73mAD, some SOB and similar back pain.    Balance Testing -SLS balance ~5-6 sec bilat @ 10th visit <5sec bilat   INTERVENTION THIS DATE:  -  Left ankle DF 2x15 c 5lbAW around forefoot -Seated heel raises starting in dorsiflexion 2x15, 32lbs on knees  -leg press, seat hole 5, 105lb 2x10 (excellent control)       PT Short Term Goals - 07/25/19 1213      PT SHORT TERM GOAL #1   Title  After 4 weeks pt to demonstrate improved tolerance to 6MWT without foot scuffing LOB.    Baseline  2MWT at evaluation 0.63ms (unable to go farther d/t back pain/fatigue); on 07/25/19 2MWT 3445f@ 0.8877m   Time  4    Period  Weeks    Status  Achieved    Target Date  07/20/19      PT SHORT TERM GOAL #2   Title  After 4 weeks pt to demonstrate SLS>10sec bilat.    Baseline  At eval ~5-7sec bilat; unchanged at visit 10  (07/25/19)    Time  4    Period  Weeks    Status  Not Met    Target Date  07/20/19        PT Long Term Goals - 07/25/19 1217      PT LONG TERM GOAL #1   Title  Pt to demonstrate improved SLS heel raise to >15 bilat without loss of height.    Baseline  Loss of height after 7x bilat    Time  8    Period  Weeks    Status  On-going    Target Date  08/20/19      PT LONG TERM GOAL #2   Title  Pt to demonstrate 5xSTS <11sec to demonstrate improved funcitonal ankle/hip strength and coordiantion.    Baseline  ~15sec at eval; unchanged at visit 10    Time  8    Period  Weeks    Status  On-going    Target Date  08/20/19      PT LONG TERM GOAL #3   Title  Pt to demonstrate improved SLS >25sec bilat    Baseline  ~5-7sec at eval; unchanged at visit 10    Time  8    Period  Weeks    Status  On-going    Target Date  08/20/19      PT LONG TERM GOAL #4   Title  Pt to demonstrate 4+/5 hip ABDCT strength bilat.    Baseline  4-/5 at eval    Time  8    Period  Weeks    Status  On-going    Target Date  08/20/19            Plan - 07/25/19 1210    Clinical Impression Statement  Reassessment tis date shows >10% improvement in sustained walking performance, but SLS and STS performance are both unchanged compared to evaluation. Continued session with isolated ankle and hip strengthening. Pt making generally good progress thus far, albeit more attention should subsequently be brought to balance deficits in SLS. Additionally, it is evident to what degree knee extension strength is limiting both transfers and AMB, AMB particularly being driven by a locked knee, hip etexnsion dominant impetus. Pt reports she notices her lack of knee flexion in gait. More focal intervention of knee extension would be advised for best overall global progress.    Rehab Potential  Good    PT Frequency  2x / week    PT Duration  8 weeks    PT Treatment/Interventions  ADLs/Self Care Home Management;Electrical  Stimulation;Moist Heat;Stair training;Gait training;DME Instruction;Functional mobility training;Therapeutic activities;Therapeutic  exercise;Balance training;Neuromuscular re-education;Patient/family education;Manual techniques;Passive range of motion;Dry needling;Vestibular;Taping    PT Next Visit Plan  Review goals, HEP; work on ankle strength, hip strength, and dynamic balance training    PT Home Exercise Plan  3x60sec heel strike AMB; 1x15 heel raises, 1x15 wall leaning dorsiflexion    Consulted and Agree with Plan of Care  Patient       Patient will benefit from skilled therapeutic intervention in order to improve the following deficits and impairments:  Abnormal gait, Decreased balance, Decreased endurance, Difficulty walking, Hypomobility, Increased muscle spasms, Decreased knowledge of precautions, Decreased range of motion, Decreased activity tolerance, Decreased coordination, Decreased knowledge of use of DME, Decreased strength, Postural dysfunction  Visit Diagnosis: Unsteadiness on feet  Stiffness of left ankle, not elsewhere classified     Problem List Patient Active Problem List   Diagnosis Date Noted  . Closed fracture of proximal end of right humerus 01/10/2019  . Chronic venous insufficiency 09/06/2018  . Lymphedema 09/06/2018  . GERD without esophagitis 03/10/2018  . Bilateral dry eyes 03/10/2018  . COPD (chronic obstructive pulmonary disease) ([place]) 03/04/2018  . Acute exacerbation of COPD with asthma ([place]) 02/22/2018  . Chronic anemia 02/22/2018  . Chronic constipation 02/22/2018  . Chronic bilateral thoracic back pain 02/22/2018  . Closed displaced fracture of right femoral neck ([place]) 02/22/2018  . Congenital heart failure ([place])   . Status post hip hemiarthroplasty 02/12/2018  . Hip fracture ([place]) 02/11/2018  . Asthma 04/30/2017  . Chronic reflux esophagitis 04/30/2017  . Hyperlipemia 04/30/2017  . Osteoporosis 04/30/2017  . Cardiac murmur, previously  undiagnosed 04/30/2017  . Diverticulosis of small intestine without hemorrhage 04/30/2017  . Dyspnea on exertion 04/30/2017  . Hx of adenomatous colonic polyps 04/30/2017  . Hx of cardiomegaly 04/30/2017  . Lumbar arthropathy 04/30/2017  . Nasopharyngitis 04/30/2017  . Nonspecific chest pain 04/30/2017  . Osteoarthropathy 04/30/2017  . Leg swelling 04/30/2017  . Sinusitis, acute 04/30/2017  . History of bone density study 06/28/2004   12:38 PM, 07/25/19 Etta Grandchild, PT, DPT Physical Therapist - [facility]  Outpatient Physical Therapy- [place] [phone]     Etta Grandchild 07/25/2019, 12:20 PM  [place] MAIN [facility] SERVICES [address] [place], [place], [address] Phone: [phone]   Fax:  [phone]  Name: Teresa Chambers MRN: [id number] Date of Birth: 04/11/1936

## 2019-07-27 ENCOUNTER — Ambulatory Visit: Payer: PPO

## 2019-07-27 ENCOUNTER — Other Ambulatory Visit: Payer: Self-pay

## 2019-07-27 DIAGNOSIS — R2681 Unsteadiness on feet: Secondary | ICD-10-CM

## 2019-07-27 DIAGNOSIS — M25672 Stiffness of left ankle, not elsewhere classified: Secondary | ICD-10-CM

## 2019-07-27 NOTE — Therapy (Signed)
[place] MAIN [facility] SERVICES [address] [place], [place], [address] Phone: [phone]   Fax:  [phone]  Physical Therapy Treatment  Patient Details  Name: Teresa Chambers MRN: [id number] Date of Birth: 06/26/1936 No data recorded  Encounter Date: 07/27/2019  PT End of Session - 07/27/19 1158    Visit Number  11    Number of Visits  16    Date for PT Re-Evaluation  08/19/18    Authorization Type  HTA medicare    Authorization Time Period  06/20/19-08/19/18    Authorization - Visit Number  1    Authorization - Number of Visits  10    PT Start Time  9407    PT Stop Time  1231    PT Time Calculation (min)  38 min    Activity Tolerance  Patient tolerated treatment well;No increased pain    Behavior During Therapy  WFL for tasks assessed/performed       Past Medical History:  Diagnosis Date  . Acute thoracic back pain   . Asthma   . Chest pain    unspecified  . CHF (congestive heart failure) ([place]) 04/30/2017  . Chronic abdominal pain 05/01/2017   unspecified  . Congenital heart failure ([place])   . COPD (chronic obstructive pulmonary disease) ([place])   . Disease of upper respiratory system 04/30/2017  . Dyspnea on exertion   . Esophageal reflux disease 04/30/2017  . History of bone density study 06/28/2004  . Leg swelling   . Lumbar arthropathy   . Nasopharyngitis 04/30/2017  . Osteoarthropathy 04/30/2017  . Osteoporosis 04/30/2017  . Palpitations   . Sinusitis, acute 04/30/2017   unspecified    Past Surgical History:  Procedure Laterality Date  . CATARACT EXTRACTION     07/21/2001 - 07/20/2002  . CHOLECYSTECTOMY     07/21/1997 - 07/20/1998  . COLON SURGERY    . COLONOSCOPY     05/05/2000, 01/17/2000 Adenomatous Polyps   . COLONOSCOPY     11/05/2009, 12/23/2004, 07/07/2001   PH Adenomatous Polyps: CBF 10/2014; recall ltr mailed 09/18/2014 (dw)  . ESOPHAGOGASTRODUODENOSCOPY     11/05/2009, 05/05/2000  . FLEXIBLE  SIGMOIDOSCOPY  11/28/1999  . HAND SURGERY  2006  . HERNIA REPAIR    . HIP ARTHROPLASTY Right 02/11/2018   Procedure: ARTHROPLASTY BIPOLAR HIP (HEMIARTHROPLASTY);  Surgeon: Corky Mull, MD;  Location: [facility] ORS;  Service: Orthopedics;  Laterality: Right;  . LAPAROSCOPIC COLON RESECTION     2001    There were no vitals filed for this visit.  Subjective Assessment - 07/27/19 1156    Subjective  Pt doing well this date. Reports she has been trying to SLS at home thoughout the day.    Pertinent History  [name] is an 47yoF who comes to [facility] for. PMH: falls s/p Rt hip fracture and Rt HHA July 2019, COPD, asthma, CHF, hernia repair. chronic venous insufficiency, Right shoulder fracture June 2020 managed conservatively and PT 20x.Pt is referred to OPPT for ongoing balance issues and several falls over the past 2 years. Pt reports some difficulty managing her feet when walking.    Currently in Pain?  Yes    Pain Score  2     Pain Location  --   chronic low back pain       INTERVENTION THIS DATE:  -SLS balance 15x5sec bilat, intermittent hand hold, supervision level assistance  -bilat leg press, seat hole 5, 105lb 2x15 (excellent control)  -standing  heel raises on airex pad, BUE support, encouraged BUE use PRN to achieve full ankle ROM -Left ankle DF 2x15 c 5lbAW around forefoot (pt reports subjectively better DF excursion on Rt)  -lateral stepping in bars with BlueTB at knees 1x79f bilat  -LAQ 1x15 bilat @ 5lb; 2x15 bilat '@7lb'$        PT Short Term Goals - 07/25/19 1213      PT SHORT TERM GOAL #1   Title  After 4 weeks pt to demonstrate improved tolerance to 6MWT without foot scuffing LOB.    Baseline  2MWT at evaluation 0.713m (unable to go farther d/t back pain/fatigue); on 07/25/19 2MWT 34727f 0.24m98m  Time  4    Period  Weeks    Status  Achieved    Target Date  07/20/19      PT SHORT TERM GOAL #2   Title  After 4 weeks pt to demonstrate SLS>10sec bilat.    Baseline   At eval ~5-7sec bilat; unchanged at visit 10 (07/25/19)    Time  4    Period  Weeks    Status  Not Met    Target Date  07/20/19        PT Long Term Goals - 07/25/19 1217      PT LONG TERM GOAL #1   Title  Pt to demonstrate improved SLS heel raise to >15 bilat without loss of height.    Baseline  Loss of height after 7x bilat    Time  8    Period  Weeks    Status  On-going    Target Date  08/20/19      PT LONG TERM GOAL #2   Title  Pt to demonstrate 5xSTS <11sec to demonstrate improved funcitonal ankle/hip strength and coordiantion.    Baseline  ~15sec at eval; unchanged at visit 10    Time  8    Period  Weeks    Status  On-going    Target Date  08/20/19      PT LONG TERM GOAL #3   Title  Pt to demonstrate improved SLS >25sec bilat    Baseline  ~5-7sec at eval; unchanged at visit 10    Time  8    Period  Weeks    Status  On-going    Target Date  08/20/19      PT LONG TERM GOAL #4   Title  Pt to demonstrate 4+/5 hip ABDCT strength bilat.    Baseline  4-/5 at eval    Time  8    Period  Weeks    Status  On-going    Target Date  08/20/19            Plan - 07/27/19 1158    Clinical Impression Statement  Started with updated plan of care this date after reassessment last visit. Pt tolerates session well in general, no exacerbation of pain. Additional focus on single limb loading as well as isolated knee and hip extension strengthening. Able to advance reps on leg press without pain or difficulty. Added loaded LAQ and resistance band side stepping inaugurally this date.     PT Frequency  2x / week    PT Duration  8 weeks    PT Treatment/Interventions  ADLs/Self Care Home Management;Electrical Stimulation;Moist Heat;Stair training;Gait training;DME Instruction;Functional mobility training;Therapeutic activities;Therapeutic exercise;Balance training;Neuromuscular re-education;Patient/family education;Manual techniques;Passive range of motion;Dry needling;Vestibular;Taping     PT Next Visit Plan  Isolated knee extension strength, deep range composite  knee/hip extension strnegth, single leg balance, hip ABDCT strength.    PT Home Exercise Plan  3x60sec heel strike AMB; 1x15 heel raises, 1x15 wall leaning dorsiflexion    Consulted and Agree with Plan of Care  Patient       Patient will benefit from skilled therapeutic intervention in order to improve the following deficits and impairments:  Abnormal gait, Decreased balance, Decreased endurance, Difficulty walking, Hypomobility, Increased muscle spasms, Decreased knowledge of precautions, Decreased range of motion, Decreased activity tolerance, Decreased coordination, Decreased knowledge of use of DME, Decreased strength, Postural dysfunction  Visit Diagnosis: Unsteadiness on feet  Stiffness of left ankle, not elsewhere classified     Problem List Patient Active Problem List   Diagnosis Date Noted  . Closed fracture of proximal end of right humerus 01/10/2019  . Chronic venous insufficiency 09/06/2018  . Lymphedema 09/06/2018  . GERD without esophagitis 03/10/2018  . Bilateral dry eyes 03/10/2018  . COPD (chronic obstructive pulmonary disease) ([place]) 03/04/2018  . Acute exacerbation of COPD with asthma ([place]) 02/22/2018  . Chronic anemia 02/22/2018  . Chronic constipation 02/22/2018  . Chronic bilateral thoracic back pain 02/22/2018  . Closed displaced fracture of right femoral neck ([place]) 02/22/2018  . Congenital heart failure ([place])   . Status post hip hemiarthroplasty 02/12/2018  . Hip fracture ([place]) 02/11/2018  . Asthma 04/30/2017  . Chronic reflux esophagitis 04/30/2017  . Hyperlipemia 04/30/2017  . Osteoporosis 04/30/2017  . Cardiac murmur, previously undiagnosed 04/30/2017  . Diverticulosis of small intestine without hemorrhage 04/30/2017  . Dyspnea on exertion 04/30/2017  . Hx of adenomatous colonic polyps 04/30/2017  . Hx of cardiomegaly 04/30/2017  . Lumbar arthropathy 04/30/2017  .  Nasopharyngitis 04/30/2017  . Nonspecific chest pain 04/30/2017  . Osteoarthropathy 04/30/2017  . Leg swelling 04/30/2017  . Sinusitis, acute 04/30/2017  . History of bone density study 06/28/2004   12:16 PM, 07/27/19 Etta Grandchild, PT, DPT Physical Therapist - [facility]  Outpatient Physical Therapy- [place] [phone]     Etta Grandchild 07/27/2019, 12:06 PM  [place] MAIN [facility] SERVICES [address] [place], [place], [address] Phone: [phone]   Fax:  [phone]  Name: ROZLYNN LIPPOLD MRN: [id number] Date of Birth: 02/26/1936

## 2019-07-28 DIAGNOSIS — R011 Cardiac murmur, unspecified: Secondary | ICD-10-CM | POA: Diagnosis not present

## 2019-07-28 DIAGNOSIS — I10 Essential (primary) hypertension: Secondary | ICD-10-CM | POA: Diagnosis not present

## 2019-07-28 DIAGNOSIS — I471 Supraventricular tachycardia: Secondary | ICD-10-CM | POA: Diagnosis not present

## 2019-07-28 DIAGNOSIS — E119 Type 2 diabetes mellitus without complications: Secondary | ICD-10-CM | POA: Diagnosis not present

## 2019-07-28 DIAGNOSIS — M47816 Spondylosis without myelopathy or radiculopathy, lumbar region: Secondary | ICD-10-CM | POA: Diagnosis not present

## 2019-08-01 ENCOUNTER — Encounter: Payer: Self-pay | Admitting: Physical Therapy

## 2019-08-01 ENCOUNTER — Ambulatory Visit: Payer: PPO | Admitting: Physical Therapy

## 2019-08-01 ENCOUNTER — Other Ambulatory Visit: Payer: Self-pay

## 2019-08-01 DIAGNOSIS — R2681 Unsteadiness on feet: Secondary | ICD-10-CM

## 2019-08-01 DIAGNOSIS — M25672 Stiffness of left ankle, not elsewhere classified: Secondary | ICD-10-CM

## 2019-08-01 NOTE — Therapy (Signed)
[place] MAIN [facility] SERVICES [address] [place], [place], [address] Phone: [phone]   Fax:  [phone]  Physical Therapy Treatment  Patient Details  Name: Teresa Chambers MRN: [id number] Date of Birth: 1935/08/12 No data recorded  Encounter Date: 08/01/2019  PT End of Session - 08/01/19 0924    Visit Number  12    Number of Visits  16    Date for PT Re-Evaluation  08/19/18    Authorization Type  HTA medicare    Authorization Time Period  06/20/19-08/19/18    Authorization - Visit Number  2    Authorization - Number of Visits  10    PT Start Time  0928    PT Stop Time  1015    PT Time Calculation (min)  47 min    Equipment Utilized During Treatment  Gait belt    Activity Tolerance  Patient tolerated treatment well;No increased pain    Behavior During Therapy  WFL for tasks assessed/performed       Past Medical History:  Diagnosis Date  . Acute thoracic back pain   . Asthma   . Chest pain    unspecified  . CHF (congestive heart failure) ([place]) 04/30/2017  . Chronic abdominal pain 05/01/2017   unspecified  . Congenital heart failure ([place])   . COPD (chronic obstructive pulmonary disease) ([place])   . Disease of upper respiratory system 04/30/2017  . Dyspnea on exertion   . Esophageal reflux disease 04/30/2017  . History of bone density study 06/28/2004  . Leg swelling   . Lumbar arthropathy   . Nasopharyngitis 04/30/2017  . Osteoarthropathy 04/30/2017  . Osteoporosis 04/30/2017  . Palpitations   . Sinusitis, acute 04/30/2017   unspecified    Past Surgical History:  Procedure Laterality Date  . CATARACT EXTRACTION     07/21/2001 - 07/20/2002  . CHOLECYSTECTOMY     07/21/1997 - 07/20/1998  . COLON SURGERY    . COLONOSCOPY     05/05/2000, 01/17/2000 Adenomatous Polyps   . COLONOSCOPY     11/05/2009, 12/23/2004, 07/07/2001   PH Adenomatous Polyps: CBF 10/2014; recall ltr mailed 09/18/2014 (dw)  . ESOPHAGOGASTRODUODENOSCOPY      11/05/2009, 05/05/2000  . FLEXIBLE SIGMOIDOSCOPY  11/28/1999  . HAND SURGERY  2006  . HERNIA REPAIR    . HIP ARTHROPLASTY Right 02/11/2018   Procedure: ARTHROPLASTY BIPOLAR HIP (HEMIARTHROPLASTY);  Surgeon: Corky Mull, MD;  Location: [facility] ORS;  Service: Orthopedics;  Laterality: Right;  . LAPAROSCOPIC COLON RESECTION     2001    There were no vitals filed for this visit.  Subjective Assessment - 08/01/19 0928    Subjective  "I feel like I'm doing a little better. I still can't stand on one leg well but I've been practicing." Pt reports she feels like her balance is doing some better in that she is able to regain her balance some when she gets unsteady.    Pertinent History  Teresa Chambers is an 40yoF who comes to [facility] OPPT for. PMH: falls s/p Rt hip fracture and Rt HHA July 2019, COPD, asthma, CHF, hernia repair. chronic venous insufficiency, Right shoulder fracture June 2020 managed conservatively and PT 20x.Pt is referred to OPPT for ongoing balance issues and several falls over the past 2 years. Pt reports some difficulty managing her feet when walking.    Currently in Pain?  Yes    Pain Score  2     Pain Location  Back  Pain Orientation  Lower    Pain Descriptors / Indicators  Aching;Sore    Pain Type  Chronic pain    Pain Onset  More than a month ago    Pain Frequency  Intermittent    Aggravating Factors   movement/some balance exercise    Pain Relieving Factors  rest    Effect of Pain on Daily Activities  decreased activity tolerance;    Multiple Pain Sites  No             TREATMENT: Warm up on Nustep BUE/BLE level 2 x4 min (Unbilled);  Patient instructed in advanced balance exercise   Instructed patient in SLS on airex: -contralateral ball circles x15 reps each LE, Required min A for safety -unsupported with ball toss and catch x10 reps; -contralateral toe taps from airex to 4inch step with cone x10 reps each LE with min A for safety and cues for  weight shift for better stance control;  Pt required intermittent HHA on rail for safety; She also exhibits increased instability in BLE ankle with increased knee valgus during SLS tasks.   Tandem stance on airex with ball toss and catch x10 reps each foot in front with CGA for safety;   SLS on firm surface approximately 10-11 sec each LE with supervision for safety;   Exercise: Seated with green tband around BLE: -ankle DF x20 reps bilaterally; However patient denies any resistance with green tband; PT applied 7# ankle weight to forefoot ankle DF x20 reps bilaterally; Patient does require mod VCs to slow down LE movement and to increase AROM for better strengthening;   -bilat leg press, seat hole 5, 105lb 2x15 (excellent control)Required min VCs to keep knees apart to avoid valgus position;  Leg press: Single leg heel raises 75# 2x15 with min VCs for proper positioning to isolate ankle strengthening;   Standing knee terminal extension green tband x15 bilaterally with min VCs for proper positioning and exercise technique to improve quad control;   Forward lunges with rail assist x10 reps each LE with mod VCS and visual cues for proper exercise technique and position to activate optimal muscle groups. Patient had increased difficulty with increased knee valgus with each position;   Response to treatment: Tolerated well. Patient verbalized understanding of HEP.Pt instructed in advanced SLS tasks to challenge ankle stabilization and control. She does exhibit increased mobility with decreased stability especially on compliant surface; Patient also exhibits increased knee valgus with ankle IV especially during SLS which could be contributing to imbalance; Patient also reported slight increase in back pain with prolonged standing; Advanced LE strengthening with increased repetition/resistance. Patient does require min-mod VCs for correct exercise technique/positioning for optimal muscle activation;                     PT Education - 08/01/19 0923    Education Details  exercise technique, balance/HEP    Person(s) Educated  Patient    Methods  Explanation;Verbal cues    Comprehension  Verbalized understanding;Returned demonstration;Verbal cues required;Need further instruction       PT Short Term Goals - 07/25/19 1213      PT SHORT TERM GOAL #1   Title  After 4 weeks pt to demonstrate improved tolerance to 6MWT without foot scuffing LOB.    Baseline  2MWT at evaluation 0.65ms (unable to go farther d/t back pain/fatigue); on 07/25/19 2MWT 3469f@ 0.8875m   Time  4    Period  Weeks  Status  Achieved    Target Date  07/20/19      PT SHORT TERM GOAL #2   Title  After 4 weeks pt to demonstrate SLS>10sec bilat.    Baseline  At eval ~5-7sec bilat; unchanged at visit 10 (07/25/19)    Time  4    Period  Weeks    Status  Not Met    Target Date  07/20/19        PT Long Term Goals - 07/25/19 1217      PT LONG TERM GOAL #1   Title  Pt to demonstrate improved SLS heel raise to >15 bilat without loss of height.    Baseline  Loss of height after 7x bilat    Time  8    Period  Weeks    Status  On-going    Target Date  08/20/19      PT LONG TERM GOAL #2   Title  Pt to demonstrate 5xSTS <11sec to demonstrate improved funcitonal ankle/hip strength and coordiantion.    Baseline  ~15sec at eval; unchanged at visit 10    Time  8    Period  Weeks    Status  On-going    Target Date  08/20/19      PT LONG TERM GOAL #3   Title  Pt to demonstrate improved SLS >25sec bilat    Baseline  ~5-7sec at eval; unchanged at visit 10    Time  8    Period  Weeks    Status  On-going    Target Date  08/20/19      PT LONG TERM GOAL #4   Title  Pt to demonstrate 4+/5 hip ABDCT strength bilat.    Baseline  4-/5 at eval    Time  8    Period  Weeks    Status  On-going    Target Date  08/20/19            Plan - 08/01/19 1027    Clinical Impression Statement  Patient  motivated and participated well within session. Focused on SLS tasks utilizing compliant surface to challenge stance control. Patient does have increased knee valgus which leads to ankle IV which could be contributing to ankle instability in SLS. Patient instructed in advanced LE strengthening to challenge knee control. She does require min-mod VCs for proper positioning and exercise technique for optimal muscle activation. Patient would benefit from additional skilled PT Intervention to improve strength, balance and mobility;    Personal Factors and Comorbidities  Age;Behavior Pattern;Past/Current Experience;Fitness    Armed forces logistics/support/administrative officer;Reach Overhead;Bend;Locomotion Level;Lift    Examination-Participation Restrictions  Shop;Cleaning;Community Activity;Yard Work    Stability/Clinical Decision Making  Stable/Uncomplicated    Rehab Potential  Good    PT Frequency  2x / week    PT Duration  8 weeks    PT Treatment/Interventions  ADLs/Self Care Home Management;Electrical Stimulation;Moist Heat;Stair training;Gait training;DME Instruction;Functional mobility training;Therapeutic activities;Therapeutic exercise;Balance training;Neuromuscular re-education;Patient/family education;Manual techniques;Passive range of motion;Dry needling;Vestibular;Taping    PT Next Visit Plan  Review goals, HEP; work on ankle strength, hip strength, and dynamic balance training    PT Home Exercise Plan  3x60sec heel strike AMB; 1x15 heel raises, 1x15 wall leaning dorsiflexion    Consulted and Agree with Plan of Care  Patient       Patient will benefit from skilled therapeutic intervention in order to improve the following deficits and impairments:  Abnormal gait, Decreased balance, Decreased endurance, Difficulty walking, Hypomobility, Increased  muscle spasms, Decreased knowledge of precautions, Decreased range of motion, Decreased activity tolerance, Decreased coordination, Decreased knowledge of  use of DME, Decreased strength, Postural dysfunction  Visit Diagnosis: Unsteadiness on feet  Stiffness of left ankle, not elsewhere classified     Problem List Patient Active Problem List   Diagnosis Date Noted  . Closed fracture of proximal end of right humerus 01/10/2019  . Chronic venous insufficiency 09/06/2018  . Lymphedema 09/06/2018  . GERD without esophagitis 03/10/2018  . Bilateral dry eyes 03/10/2018  . COPD (chronic obstructive pulmonary disease) ([place]) 03/04/2018  . Acute exacerbation of COPD with asthma ([place]) 02/22/2018  . Chronic anemia 02/22/2018  . Chronic constipation 02/22/2018  . Chronic bilateral thoracic back pain 02/22/2018  . Closed displaced fracture of right femoral neck ([place]) 02/22/2018  . Congenital heart failure ([place])   . Status post hip hemiarthroplasty 02/12/2018  . Hip fracture ([place]) 02/11/2018  . Asthma 04/30/2017  . Chronic reflux esophagitis 04/30/2017  . Hyperlipemia 04/30/2017  . Osteoporosis 04/30/2017  . Cardiac murmur, previously undiagnosed 04/30/2017  . Diverticulosis of small intestine without hemorrhage 04/30/2017  . Dyspnea on exertion 04/30/2017  . Hx of adenomatous colonic polyps 04/30/2017  . Hx of cardiomegaly 04/30/2017  . Lumbar arthropathy 04/30/2017  . Nasopharyngitis 04/30/2017  . Nonspecific chest pain 04/30/2017  . Osteoarthropathy 04/30/2017  . Leg swelling 04/30/2017  . Sinusitis, acute 04/30/2017  . History of bone density study 06/28/2004    Teresa Chambers PT, DPT 08/01/2019, 11:18 AM  [place] MAIN [facility] SERVICES [address] [place], [place], [address] Phone: [phone]   Fax:  [phone]  Name: Teresa Chambers MRN: [id number] Date of Birth: 1936/06/04

## 2019-08-03 ENCOUNTER — Ambulatory Visit: Payer: PPO | Admitting: Physical Therapy

## 2019-08-03 ENCOUNTER — Encounter: Payer: Self-pay | Admitting: Physical Therapy

## 2019-08-03 ENCOUNTER — Other Ambulatory Visit: Payer: Self-pay

## 2019-08-03 DIAGNOSIS — R2681 Unsteadiness on feet: Secondary | ICD-10-CM | POA: Diagnosis not present

## 2019-08-03 DIAGNOSIS — Z86007 Personal history of in-situ neoplasm of skin: Secondary | ICD-10-CM | POA: Diagnosis not present

## 2019-08-03 DIAGNOSIS — I8393 Asymptomatic varicose veins of bilateral lower extremities: Secondary | ICD-10-CM | POA: Diagnosis not present

## 2019-08-03 DIAGNOSIS — L603 Nail dystrophy: Secondary | ICD-10-CM | POA: Diagnosis not present

## 2019-08-03 DIAGNOSIS — S80811A Abrasion, right lower leg, initial encounter: Secondary | ICD-10-CM | POA: Diagnosis not present

## 2019-08-03 DIAGNOSIS — L578 Other skin changes due to chronic exposure to nonionizing radiation: Secondary | ICD-10-CM | POA: Diagnosis not present

## 2019-08-03 DIAGNOSIS — Z85828 Personal history of other malignant neoplasm of skin: Secondary | ICD-10-CM | POA: Diagnosis not present

## 2019-08-03 DIAGNOSIS — Z1283 Encounter for screening for malignant neoplasm of skin: Secondary | ICD-10-CM | POA: Diagnosis not present

## 2019-08-03 DIAGNOSIS — D692 Other nonthrombocytopenic purpura: Secondary | ICD-10-CM | POA: Diagnosis not present

## 2019-08-03 DIAGNOSIS — D225 Melanocytic nevi of trunk: Secondary | ICD-10-CM | POA: Diagnosis not present

## 2019-08-03 DIAGNOSIS — M25672 Stiffness of left ankle, not elsewhere classified: Secondary | ICD-10-CM

## 2019-08-03 DIAGNOSIS — W548XXA Other contact with dog, initial encounter: Secondary | ICD-10-CM | POA: Diagnosis not present

## 2019-08-03 DIAGNOSIS — L821 Other seborrheic keratosis: Secondary | ICD-10-CM | POA: Diagnosis not present

## 2019-08-03 DIAGNOSIS — L570 Actinic keratosis: Secondary | ICD-10-CM | POA: Diagnosis not present

## 2019-08-03 NOTE — Therapy (Signed)
[place] MAIN [facility] SERVICES [address] [place], [place], [address] Phone: [phone]   Fax:  [phone]  Physical Therapy Treatment  Patient Details  Name: Teresa Chambers MRN: [id number] Date of Birth: 1936-05-03 No data recorded  Encounter Date: 08/03/2019  PT End of Session - 08/03/19 0853    Visit Number  13    Number of Visits  16    Date for PT Re-Evaluation  08/19/18    Authorization Type  HTA medicare    Authorization Time Period  06/20/19-08/19/18    Authorization - Visit Number  3    Authorization - Number of Visits  10    PT Start Time  7622    PT Stop Time  0930    PT Time Calculation (min)  43 min    Equipment Utilized During Treatment  Gait belt    Activity Tolerance  Patient tolerated treatment well;No increased pain    Behavior During Therapy  WFL for tasks assessed/performed       Past Medical History:  Diagnosis Date  . Acute thoracic back pain   . Asthma   . Chest pain    unspecified  . CHF (congestive heart failure) ([place]) 04/30/2017  . Chronic abdominal pain 05/01/2017   unspecified  . Congenital heart failure ([place])   . COPD (chronic obstructive pulmonary disease) ([place])   . Disease of upper respiratory system 04/30/2017  . Dyspnea on exertion   . Esophageal reflux disease 04/30/2017  . History of bone density study 06/28/2004  . Leg swelling   . Lumbar arthropathy   . Nasopharyngitis 04/30/2017  . Osteoarthropathy 04/30/2017  . Osteoporosis 04/30/2017  . Palpitations   . Sinusitis, acute 04/30/2017   unspecified    Past Surgical History:  Procedure Laterality Date  . CATARACT EXTRACTION     07/21/2001 - 07/20/2002  . CHOLECYSTECTOMY     07/21/1997 - 07/20/1998  . COLON SURGERY    . COLONOSCOPY     05/05/2000, 01/17/2000 Adenomatous Polyps   . COLONOSCOPY     11/05/2009, 12/23/2004, 07/07/2001   PH Adenomatous Polyps: CBF 10/2014; recall ltr mailed 09/18/2014 (dw)  . ESOPHAGOGASTRODUODENOSCOPY      11/05/2009, 05/05/2000  . FLEXIBLE SIGMOIDOSCOPY  11/28/1999  . HAND SURGERY  2006  . HERNIA REPAIR    . HIP ARTHROPLASTY Right 02/11/2018   Procedure: ARTHROPLASTY BIPOLAR HIP (HEMIARTHROPLASTY);  Surgeon: Corky Mull, MD;  Location: [facility] ORS;  Service: Orthopedics;  Laterality: Right;  . LAPAROSCOPIC COLON RESECTION     2001    There were no vitals filed for this visit.  Subjective Assessment - 08/03/19 0852    Subjective  Patient reports doing pretty well this morning. She reports continued back pain but states that is always there.    Pertinent History  [name] is an 51yoF who comes to [facility] OPPT for. PMH: falls s/p Rt hip fracture and Rt HHA July 2019, COPD, asthma, CHF, hernia repair. chronic venous insufficiency, Right shoulder fracture June 2020 managed conservatively and PT 20x.Pt is referred to OPPT for ongoing balance issues and several falls over the past 2 years. Pt reports some difficulty managing her feet when walking.    Currently in Pain?  Yes    Pain Score  2     Pain Location  Back    Pain Orientation  Lower    Pain Descriptors / Indicators  Aching;Sore    Pain Type  Chronic pain    Pain  Onset  More than a month ago    Pain Frequency  Intermittent    Aggravating Factors   movement/some balance exercise    Pain Relieving Factors  rest    Effect of Pain on Daily Activities  decreased activity tolerance; pushes through    Multiple Pain Sites  No            TREATMENT: Warm up on Nustep BUE/BLE level 2 x4 min with moist heat to low back for comfort (Unbilled);  Patient instructed in advanced balance exercise   Instructed patient in SLS on airex: -Single leg heel raises x15 reps with intermittent fingertip hold for safety; Required min VCs to increase AROM for better strengthening and stability; Exhibits increased instability with increased ROM due to poor ankle control;  -contralateral ball circles x10 reps clockwise/counterclockwise each  LE, Required min A for safety -unsupported with ball pass side/side x5 reps each LE with intermittent fingertip hold and min A for safety; ; Pt required intermittent HHA on rail for safety; She also exhibits increased instability in BLE ankle with increased knee valgus during SLS tasks.    Exercise: PT applied 7# ankle weight to forefoot ankle DF 2x20 reps bilaterally; Patient does require mod VCs to slow down LE movement and to increase AROM for better strengthening;   Sit<>Stand from regular height chair with ball between knee for less valgus position x10 reps with min a and mod VCs to increase forward weight shift for better transfer ability; Patient had increased difficulty with subsequent repetition due to weakness and knee discomfort;   Seated LAQ x15 reps with cues for terminal knee extension and to increase ankle DF for better quad control;   Forward lunge with rail assist for balance/safety x10 reps each foot in front with min VCs for proper positioning and exercise technique to avoid injury;   Provided written HEP for better adherence, see patient instructions;  Response to treatment: Tolerated well. Patient verbalized understanding of HEP.Pt instructed in advanced SLS tasks to challenge ankle stabilization and control. She does exhibit increased mobility with decreased stability especially on compliant surface; Patient also exhibits increased knee valgus with ankle IV especially during SLS which could be contributing to imbalance; Advanced LE strengthening with increased repetition/resistance. Patient does require min-mod VCs for correct exercise technique/positioning for optimal muscle activation;                      PT Education - 08/03/19 0853    Education Details  exercise technique, balance, HEP    Person(s) Educated  Patient    Methods  Explanation;Verbal cues    Comprehension  Verbalized understanding;Returned demonstration;Verbal cues required;Need  further instruction       PT Short Term Goals - 07/25/19 1213      PT SHORT TERM GOAL #1   Title  After 4 weeks pt to demonstrate improved tolerance to 6MWT without foot scuffing LOB.    Baseline  2MWT at evaluation 0.39ms (unable to go farther d/t back pain/fatigue); on 07/25/19 2MWT 3446f@ 0.8835m   Time  4    Period  Weeks    Status  Achieved    Target Date  07/20/19      PT SHORT TERM GOAL #2   Title  After 4 weeks pt to demonstrate SLS>10sec bilat.    Baseline  At eval ~5-7sec bilat; unchanged at visit 10 (07/25/19)    Time  4    Period  Weeks  Status  Not Met    Target Date  07/20/19        PT Long Term Goals - 07/25/19 1217      PT LONG TERM GOAL #1   Title  Pt to demonstrate improved SLS heel raise to >15 bilat without loss of height.    Baseline  Loss of height after 7x bilat    Time  8    Period  Weeks    Status  On-going    Target Date  08/20/19      PT LONG TERM GOAL #2   Title  Pt to demonstrate 5xSTS <11sec to demonstrate improved funcitonal ankle/hip strength and coordiantion.    Baseline  ~15sec at eval; unchanged at visit 10    Time  8    Period  Weeks    Status  On-going    Target Date  08/20/19      PT LONG TERM GOAL #3   Title  Pt to demonstrate improved SLS >25sec bilat    Baseline  ~5-7sec at eval; unchanged at visit 10    Time  8    Period  Weeks    Status  On-going    Target Date  08/20/19      PT LONG TERM GOAL #4   Title  Pt to demonstrate 4+/5 hip ABDCT strength bilat.    Baseline  4-/5 at eval    Time  8    Period  Weeks    Status  On-going    Target Date  08/20/19            Plan - 08/03/19 0911    Clinical Impression Statement  Patient motivated and participated well within session. She was instructed in advanced SLS tasks to challenge ankle stability as well as static standing balance. She continues to have difficulty with increased knee valgus and poor ankle control. Patient requires intermittent rail assist and min A  for safety. Progressed LE strengthening for better knee stability, advancing HEP. See patient instructions. Patient does require min VCs for correct exercise technique. She would benefit from additional skilled PT Intervention to improve strength, balance and gait safety;    Personal Factors and Comorbidities  Age;Behavior Pattern;Past/Current Experience;Fitness    Armed forces logistics/support/administrative officer;Reach Overhead;Bend;Locomotion Level;Lift    Examination-Participation Restrictions  Shop;Cleaning;Community Activity;Yard Work    Stability/Clinical Decision Making  Stable/Uncomplicated    Rehab Potential  Good    PT Frequency  2x / week    PT Duration  8 weeks    PT Treatment/Interventions  ADLs/Self Care Home Management;Electrical Stimulation;Moist Heat;Stair training;Gait training;DME Instruction;Functional mobility training;Therapeutic activities;Therapeutic exercise;Balance training;Neuromuscular re-education;Patient/family education;Manual techniques;Passive range of motion;Dry needling;Vestibular;Taping    PT Next Visit Plan  Review goals, HEP; work on ankle strength, hip strength, and dynamic balance training    PT Home Exercise Plan  3x60sec heel strike AMB; 1x15 heel raises, 1x15 wall leaning dorsiflexion    Consulted and Agree with Plan of Care  Patient       Patient will benefit from skilled therapeutic intervention in order to improve the following deficits and impairments:  Abnormal gait, Decreased balance, Decreased endurance, Difficulty walking, Hypomobility, Increased muscle spasms, Decreased knowledge of precautions, Decreased range of motion, Decreased activity tolerance, Decreased coordination, Decreased knowledge of use of DME, Decreased strength, Postural dysfunction  Visit Diagnosis: Stiffness of left ankle, not elsewhere classified  Unsteadiness on feet     Problem List Patient Active Problem List   Diagnosis Date Noted  .  Closed fracture of proximal end of  right humerus 01/10/2019  . Chronic venous insufficiency 09/06/2018  . Lymphedema 09/06/2018  . GERD without esophagitis 03/10/2018  . Bilateral dry eyes 03/10/2018  . COPD (chronic obstructive pulmonary disease) ([place]) 03/04/2018  . Acute exacerbation of COPD with asthma ([place]) 02/22/2018  . Chronic anemia 02/22/2018  . Chronic constipation 02/22/2018  . Chronic bilateral thoracic back pain 02/22/2018  . Closed displaced fracture of right femoral neck ([place]) 02/22/2018  . Congenital heart failure ([place])   . Status post hip hemiarthroplasty 02/12/2018  . Hip fracture ([place]) 02/11/2018  . Asthma 04/30/2017  . Chronic reflux esophagitis 04/30/2017  . Hyperlipemia 04/30/2017  . Osteoporosis 04/30/2017  . Cardiac murmur, previously undiagnosed 04/30/2017  . Diverticulosis of small intestine without hemorrhage 04/30/2017  . Dyspnea on exertion 04/30/2017  . Hx of adenomatous colonic polyps 04/30/2017  . Hx of cardiomegaly 04/30/2017  . Lumbar arthropathy 04/30/2017  . Nasopharyngitis 04/30/2017  . Nonspecific chest pain 04/30/2017  . Osteoarthropathy 04/30/2017  . Leg swelling 04/30/2017  . Sinusitis, acute 04/30/2017  . History of bone density study 06/28/2004    Trotter,Margaret PT, DPT 08/03/2019, 9:30 AM  [place] MAIN [facility] SERVICES [address] [place], [place], [address] Phone: [phone]   Fax:  [phone]  Name: JOHNNAY PLEITEZ MRN: [id number] Date of Birth: 1935-09-21

## 2019-08-03 NOTE — Patient Instructions (Signed)
Access Code: [email or web address]  URL: [email or web address]  Date: 08/03/2019  Prepared by: Blanche East   Exercises  Sit to Stand - 10 reps - 2 sets - 1x daily - 7x weekly  Seated Long Arc Quad - 15 reps - 2 sets - 1x daily - 7x weekly  Lunge with Counter Support - 10 reps - 2 sets - 1x daily - 7x weekly

## 2019-08-09 ENCOUNTER — Other Ambulatory Visit: Payer: Self-pay

## 2019-08-09 ENCOUNTER — Encounter: Payer: Self-pay | Admitting: Physical Therapy

## 2019-08-09 ENCOUNTER — Ambulatory Visit: Payer: PPO | Admitting: Physical Therapy

## 2019-08-09 DIAGNOSIS — M25672 Stiffness of left ankle, not elsewhere classified: Secondary | ICD-10-CM

## 2019-08-09 DIAGNOSIS — R2681 Unsteadiness on feet: Secondary | ICD-10-CM | POA: Diagnosis not present

## 2019-08-09 NOTE — Therapy (Signed)
[place] MAIN [facility] SERVICES [address] [place], [place], [address] Phone: [phone]   Fax:  [phone]  Physical Therapy Treatment  Patient Details  Name: Teresa Chambers MRN: [id number] Date of Birth: 1936/05/11 No data recorded  Encounter Date: 08/09/2019  PT End of Session - 08/09/19 1106    Visit Number  14    Number of Visits  16    Date for PT Re-Evaluation  08/19/18    Authorization Type  HTA medicare    Authorization Time Period  06/20/19-08/19/18    Authorization - Visit Number  4    Authorization - Number of Visits  10    PT Start Time  1102    PT Stop Time  1145    PT Time Calculation (min)  43 min    Equipment Utilized During Treatment  Gait belt    Activity Tolerance  Patient tolerated treatment well;No increased pain    Behavior During Therapy  WFL for tasks assessed/performed       Past Medical History:  Diagnosis Date  . Acute thoracic back pain   . Asthma   . Chest pain    unspecified  . CHF (congestive heart failure) ([place]) 04/30/2017  . Chronic abdominal pain 05/01/2017   unspecified  . Congenital heart failure ([place])   . COPD (chronic obstructive pulmonary disease) ([place])   . Disease of upper respiratory system 04/30/2017  . Dyspnea on exertion   . Esophageal reflux disease 04/30/2017  . History of bone density study 06/28/2004  . Leg swelling   . Lumbar arthropathy   . Nasopharyngitis 04/30/2017  . Osteoarthropathy 04/30/2017  . Osteoporosis 04/30/2017  . Palpitations   . Sinusitis, acute 04/30/2017   unspecified    Past Surgical History:  Procedure Laterality Date  . CATARACT EXTRACTION     07/21/2001 - 07/20/2002  . CHOLECYSTECTOMY     07/21/1997 - 07/20/1998  . COLON SURGERY    . COLONOSCOPY     05/05/2000, 01/17/2000 Adenomatous Polyps   . COLONOSCOPY     11/05/2009, 12/23/2004, 07/07/2001   PH Adenomatous Polyps: CBF 10/2014; recall ltr mailed 09/18/2014 (dw)  . ESOPHAGOGASTRODUODENOSCOPY      11/05/2009, 05/05/2000  . FLEXIBLE SIGMOIDOSCOPY  11/28/1999  . HAND SURGERY  2006  . HERNIA REPAIR    . HIP ARTHROPLASTY Right 02/11/2018   Procedure: ARTHROPLASTY BIPOLAR HIP (HEMIARTHROPLASTY);  Surgeon: Corky Mull, MD;  Location: [facility] ORS;  Service: Orthopedics;  Laterality: Right;  . LAPAROSCOPIC COLON RESECTION     2001    There were no vitals filed for this visit.  Subjective Assessment - 08/09/19 1105    Subjective  Patient reports doing well today. She reports still having trouble with standing on one leg. She reports adherence with HEP and has had some soreness with new exercise and with colder weather.    Pertinent History  Teresa Chambers is an 59yoF who comes to [facility] OPPT for. PMH: falls s/p Rt hip fracture and Rt HHA July 2019, COPD, asthma, CHF, hernia repair. chronic venous insufficiency, Right shoulder fracture June 2020 managed conservatively and PT 20x.Pt is referred to OPPT for ongoing balance issues and several falls over the past 2 years. Pt reports some difficulty managing her feet when walking.    Currently in Pain?  No/denies    Pain Onset  More than a month ago    Multiple Pain Sites  No          TREATMENT:  Warm up on Nustep BUE/BLE level 2 x4 min with moist heat to low back for comfort (Unbilled);  Patient instructed in advanced balance exercise  Standing on airex: -alternate toe taps airex to 6 inch step without rail assist x15 bilaterally, CGA for safety; -standing one foot on airex, one foot on 8 inch step, unsupported  Standing unsupported 15 sec hold x1 reps each  Standing unsupported with head turns side/side, up/down x5 reps each foot on step; Required CGA for safety and cues for proper weight shift for better stance control;   Instructed patient in SLSon airex: -Single leg heel raises x15 reps with intermittent fingertip hold for safety; Required min VCs to increase AROM for better strengthening and stability; Exhibits  increased instability with increased ROM due to poor ankle control;  -contralateral ball circles x10 reps clockwise/counterclockwise each LE, Required min A for safety Pt required intermittent HHA on rail for safety; She also exhibits increased instability in BLE ankle with increased knee valgus during SLS tasks.    Exercise: PT applied 10# ankle weight to forefoot ankle DF 2x20 reps bilaterally; Patient does require mod VCs to slow down LE movement and to increase AROM for better strengthening;  Seated LAQ 5# x15 reps with cues for terminal knee extension and to increase ankle DF for better quad control;   Standing with red tband around BLE above knees: -side stepping with mini squat x10 feet x3 laps each direction with intermittent rail assist as needed for balance.   Leg press:  BLE plate 40# 3M19 with min VCS for proper positioning and to slow down LE movement for better strengthening. Patient did require min A for getting on/off equipment for safety;    Response to treatment: Tolerated well. Pt instructed in advanced SLS tasks to challenge ankle stabilization and control. She does exhibit increased mobility with decreased stability especially on compliant surface; Patient also exhibits increased knee valgus with ankle IV especially during SLS which could be contributing to imbalance; Advanced LE strengthening with increased repetition/resistance. Patient does require min-mod VCs for correct exercise technique/positioning for optimal muscle activation;                        PT Education - 08/09/19 1105    Education Details  exercise technique, balance/strength, HEP    Person(s) Educated  Patient    Methods  Explanation;Verbal cues    Comprehension  Verbalized understanding;Returned demonstration;Verbal cues required;Need further instruction       PT Short Term Goals - 07/25/19 1213      PT SHORT TERM GOAL #1   Title  After 4 weeks pt to demonstrate  improved tolerance to 6MWT without foot scuffing LOB.    Baseline  2MWT at evaluation 0.74ms (unable to go farther d/t back pain/fatigue); on 07/25/19 2MWT 3423f@ 0.8824m   Time  4    Period  Weeks    Status  Achieved    Target Date  07/20/19      PT SHORT TERM GOAL #2   Title  After 4 weeks pt to demonstrate SLS>10sec bilat.    Baseline  At eval ~5-7sec bilat; unchanged at visit 10 (07/25/19)    Time  4    Period  Weeks    Status  Not Met    Target Date  07/20/19        PT Long Term Goals - 07/25/19 1217      PT LONG TERM GOAL #1  Title  Pt to demonstrate improved SLS heel raise to >15 bilat without loss of height.    Baseline  Loss of height after 7x bilat    Time  8    Period  Weeks    Status  On-going    Target Date  08/20/19      PT LONG TERM GOAL #2   Title  Pt to demonstrate 5xSTS <11sec to demonstrate improved funcitonal ankle/hip strength and coordiantion.    Baseline  ~15sec at eval; unchanged at visit 10    Time  8    Period  Weeks    Status  On-going    Target Date  08/20/19      PT LONG TERM GOAL #3   Title  Pt to demonstrate improved SLS >25sec bilat    Baseline  ~5-7sec at eval; unchanged at visit 10    Time  8    Period  Weeks    Status  On-going    Target Date  08/20/19      PT LONG TERM GOAL #4   Title  Pt to demonstrate 4+/5 hip ABDCT strength bilat.    Baseline  4-/5 at eval    Time  8    Period  Weeks    Status  On-going    Target Date  08/20/19            Plan - 08/09/19 1121    Clinical Impression Statement  Patient motivated and participated well within session. Progressed SLS tasks to challenge stance control and balance exercise. She continues to require intermittent CGA to min A for safety especially with less rail assist; Patient continues to have decreased ankle strategies and knee valgus position which could be contributing to imbalance. Patient instructed in advanced LE strength to improve quad control for better stance  control. She requires min VCS for correct exercise technique for optimal muscle strengthening. She would benefit from additional skilled PT intervention to improve strength, balance and mobility;    Personal Factors and Comorbidities  Age;Behavior Pattern;Past/Current Experience;Fitness    Armed forces logistics/support/administrative officer;Reach Overhead;Bend;Locomotion Level;Lift    Examination-Participation Restrictions  Shop;Cleaning;Community Activity;Yard Work    Stability/Clinical Decision Making  Stable/Uncomplicated    Rehab Potential  Good    PT Frequency  2x / week    PT Duration  8 weeks    PT Treatment/Interventions  ADLs/Self Care Home Management;Electrical Stimulation;Moist Heat;Stair training;Gait training;DME Instruction;Functional mobility training;Therapeutic activities;Therapeutic exercise;Balance training;Neuromuscular re-education;Patient/family education;Manual techniques;Passive range of motion;Dry needling;Vestibular;Taping    PT Next Visit Plan  Review goals, HEP; work on ankle strength, hip strength, and dynamic balance training    PT Home Exercise Plan  3x60sec heel strike AMB; 1x15 heel raises, 1x15 wall leaning dorsiflexion    Consulted and Agree with Plan of Care  Patient       Patient will benefit from skilled therapeutic intervention in order to improve the following deficits and impairments:  Abnormal gait, Decreased balance, Decreased endurance, Difficulty walking, Hypomobility, Increased muscle spasms, Decreased knowledge of precautions, Decreased range of motion, Decreased activity tolerance, Decreased coordination, Decreased knowledge of use of DME, Decreased strength, Postural dysfunction  Visit Diagnosis: Stiffness of left ankle, not elsewhere classified  Unsteadiness on feet     Problem List Patient Active Problem List   Diagnosis Date Noted  . Closed fracture of proximal end of right humerus 01/10/2019  . Chronic venous insufficiency 09/06/2018  .  Lymphedema 09/06/2018  . GERD without esophagitis 03/10/2018  . Bilateral  dry eyes 03/10/2018  . COPD (chronic obstructive pulmonary disease) ([place]) 03/04/2018  . Acute exacerbation of COPD with asthma ([place]) 02/22/2018  . Chronic anemia 02/22/2018  . Chronic constipation 02/22/2018  . Chronic bilateral thoracic back pain 02/22/2018  . Closed displaced fracture of right femoral neck ([place]) 02/22/2018  . Congenital heart failure ([place])   . Status post hip hemiarthroplasty 02/12/2018  . Hip fracture ([place]) 02/11/2018  . Asthma 04/30/2017  . Chronic reflux esophagitis 04/30/2017  . Hyperlipemia 04/30/2017  . Osteoporosis 04/30/2017  . Cardiac murmur, previously undiagnosed 04/30/2017  . Diverticulosis of small intestine without hemorrhage 04/30/2017  . Dyspnea on exertion 04/30/2017  . Hx of adenomatous colonic polyps 04/30/2017  . Hx of cardiomegaly 04/30/2017  . Lumbar arthropathy 04/30/2017  . Nasopharyngitis 04/30/2017  . Nonspecific chest pain 04/30/2017  . Osteoarthropathy 04/30/2017  . Leg swelling 04/30/2017  . Sinusitis, acute 04/30/2017  . History of bone density study 06/28/2004    Dionis Autry PT, DPT 08/09/2019, 11:42 AM  [place] MAIN [name] SERVICES [address] [place], [place], [address] Phone: [phone]   Fax:  [phone]  Name: Teresa Chambers MRN: [id number] Date of Birth: 1936-02-03

## 2019-08-10 DIAGNOSIS — E7849 Other hyperlipidemia: Secondary | ICD-10-CM | POA: Diagnosis not present

## 2019-08-11 DIAGNOSIS — M47816 Spondylosis without myelopathy or radiculopathy, lumbar region: Secondary | ICD-10-CM | POA: Diagnosis not present

## 2019-08-11 DIAGNOSIS — I471 Supraventricular tachycardia: Secondary | ICD-10-CM | POA: Diagnosis not present

## 2019-08-11 DIAGNOSIS — E119 Type 2 diabetes mellitus without complications: Secondary | ICD-10-CM | POA: Diagnosis not present

## 2019-08-11 DIAGNOSIS — R011 Cardiac murmur, unspecified: Secondary | ICD-10-CM | POA: Diagnosis not present

## 2019-08-16 ENCOUNTER — Other Ambulatory Visit: Payer: Self-pay

## 2019-08-16 ENCOUNTER — Encounter: Payer: Self-pay | Admitting: Physical Therapy

## 2019-08-16 ENCOUNTER — Ambulatory Visit: Payer: PPO | Admitting: Physical Therapy

## 2019-08-16 DIAGNOSIS — R2681 Unsteadiness on feet: Secondary | ICD-10-CM

## 2019-08-16 DIAGNOSIS — M25672 Stiffness of left ankle, not elsewhere classified: Secondary | ICD-10-CM

## 2019-08-16 NOTE — Therapy (Signed)
[place] MAIN [name] SERVICES [address] [place], [place], [address] Phone: [phone]   Fax:  [phone]  Physical Therapy Treatment  Patient Details  Name: Teresa Chambers MRN: [id number] Date of Birth: 1936/07/20 No data recorded  Encounter Date: 08/16/2019  PT End of Session - 08/16/19 1100    Visit Number  15    Number of Visits  16    Date for PT Re-Evaluation  08/19/18    Authorization Type  HTA medicare    Authorization Time Period  06/20/19-08/19/18    Authorization - Visit Number  4    Authorization - Number of Visits  10    PT Start Time  1102    PT Stop Time  1145    PT Time Calculation (min)  43 min    Equipment Utilized During Treatment  Gait belt    Activity Tolerance  Patient tolerated treatment well;No increased pain    Behavior During Therapy  WFL for tasks assessed/performed       Past Medical History:  Diagnosis Date  . Acute thoracic back pain   . Asthma   . Chest pain    unspecified  . CHF (congestive heart failure) ([place]) 04/30/2017  . Chronic abdominal pain 05/01/2017   unspecified  . Congenital heart failure ([place])   . COPD (chronic obstructive pulmonary disease) ([place])   . Disease of upper respiratory system 04/30/2017  . Dyspnea on exertion   . Esophageal reflux disease 04/30/2017  . History of bone density study 06/28/2004  . Leg swelling   . Lumbar arthropathy   . Nasopharyngitis 04/30/2017  . Osteoarthropathy 04/30/2017  . Osteoporosis 04/30/2017  . Palpitations   . Sinusitis, acute 04/30/2017   unspecified    Past Surgical History:  Procedure Laterality Date  . CATARACT EXTRACTION     07/21/2001 - 07/20/2002  . CHOLECYSTECTOMY     07/21/1997 - 07/20/1998  . COLON SURGERY    . COLONOSCOPY     05/05/2000, 01/17/2000 Adenomatous Polyps   . COLONOSCOPY     11/05/2009, 12/23/2004, 07/07/2001   PH Adenomatous Polyps: CBF 10/2014; recall ltr mailed 09/18/2014 (dw)  . ESOPHAGOGASTRODUODENOSCOPY      11/05/2009, 05/05/2000  . FLEXIBLE SIGMOIDOSCOPY  11/28/1999  . HAND SURGERY  2006  . HERNIA REPAIR    . HIP ARTHROPLASTY Right 02/11/2018   Procedure: ARTHROPLASTY BIPOLAR HIP (HEMIARTHROPLASTY);  Surgeon: Corky Mull, MD;  Location: [facility] ORS;  Service: Orthopedics;  Laterality: Right;  . LAPAROSCOPIC COLON RESECTION     2001    There were no vitals filed for this visit.  Subjective Assessment - 08/16/19 1105    Subjective  Pt reports doing well; She reports increased LLE knee soreness as a result of arthritis. She reports her HEP has been going well. She reports getting some ankle weights at hospice.    Pertinent History  Takerra Lupinacci is an 22yoF who comes to [facility] OPPT for. PMH: falls s/p Rt hip fracture and Rt HHA July 2019, COPD, asthma, CHF, hernia repair. chronic venous insufficiency, Right shoulder fracture June 2020 managed conservatively and PT 20x.Pt is referred to OPPT for ongoing balance issues and several falls over the past 2 years. Pt reports some difficulty managing her feet when walking.    Currently in Pain?  Yes    Pain Location  Knee    Pain Orientation  Left    Pain Descriptors / Indicators  Aching;Sore    Pain Type  Chronic  pain    Pain Onset  More than a month ago    Pain Frequency  Intermittent    Aggravating Factors   movement/some balance exercise    Pain Relieving Factors  rest    Effect of Pain on Daily Activities  decreased activity tolerance, pushes through;    Multiple Pain Sites  No            TREATMENT: Warm up on Nustep BUE/BLE level 2 x4 min(unbilled);  Patient instructed in advanced balance exercise  Standing on airex: -alternate toe taps airex to 6 inch step without rail assist x15 bilaterally, CGA for safety; -Feet apart, unsupported, heel raises 3 sec hold x10 reps with CGA for safety and min VCs for weight shift for better dynamic balance control;  -standing one foot on airex, one foot on 4 inch step, unsupported              Standing unsupported 15 sec hold x1 reps each             Standing unsupported with head turns side/side, up/down x5 reps each foot on step; Required CGA for safety and cues for proper weight shift for better stance control;   Instructed patient in SLSon airex: -SLS unsupported 10 sec hold x3 reps each LE with CGA for safety; -contralateral ball circles x10reps clockwise/counterclockwiseeach LE, Required min A for safety Pt required intermittent HHA on rail for safety; She also exhibits increased instability in BLE ankle with increased knee valgus during SLS tasks.   Tandem stance on airex pad: -unsupported standing 10 sec hold with CGA for safety and min VCs for neutral weight shift and positioning;  -BUE ball pass side/side x10 reps each foot in front with CGA for safety;   Exercise: PT applied 10# ankle weight to forefoot ankle DF2x20 reps bilaterally; Patient does require min VCs to slow down LE movement and to increase AROM for better strengthening;  Seated LAQ 5# x20 reps with cues for terminal knee extension and to increase ankle DF for better quad control;   Forward lunges with rail assist for safety x10 reps each LE with min VCs for proper positioning for less knee discomfort;   Standing with red tband around BLE above knees: -side stepping with mini squat x10 feet x3 laps each direction with intermittent rail assist as needed for balance.   Finished with rolling stick to BLE quads to help reduce delayed onset muscle soreness x1 min each LE;    Response to treatment: Tolerated well. Pt instructed in advanced SLS tasks to challenge ankle stabilization and control. She does exhibit increased mobility with decreased stability especially on compliant surface; Patient also exhibits increased knee valgus with ankle IV especially during SLS which could be contributing to imbalance; Advanced LE strengthening with increased repetition/resistance. Patient does require  min-mod VCs for correct exercise technique/positioning for optimal muscle activation;                       PT Education - 08/16/19 1100    Education Details  LE strength, exercise technique, balance; HEP    Person(s) Educated  Patient    Methods  Explanation;Verbal cues    Comprehension  Verbalized understanding;Returned demonstration;Verbal cues required;Need further instruction       PT Short Term Goals - 07/25/19 1213      PT SHORT TERM GOAL #1   Title  After 4 weeks pt to demonstrate improved tolerance to 6MWT without foot scuffing LOB.  Baseline  2MWT at evaluation 0.12ms (unable to go farther d/t back pain/fatigue); on 07/25/19 2MWT 3472f@ 0.8862m   Time  4    Period  Weeks    Status  Achieved    Target Date  07/20/19      PT SHORT TERM GOAL #2   Title  After 4 weeks pt to demonstrate SLS>10sec bilat.    Baseline  At eval ~5-7sec bilat; unchanged at visit 10 (07/25/19)    Time  4    Period  Weeks    Status  Not Met    Target Date  07/20/19        PT Long Term Goals - 07/25/19 1217      PT LONG TERM GOAL #1   Title  Pt to demonstrate improved SLS heel raise to >15 bilat without loss of height.    Baseline  Loss of height after 7x bilat    Time  8    Period  Weeks    Status  On-going    Target Date  08/20/19      PT LONG TERM GOAL #2   Title  Pt to demonstrate 5xSTS <11sec to demonstrate improved funcitonal ankle/hip strength and coordiantion.    Baseline  ~15sec at eval; unchanged at visit 10    Time  8    Period  Weeks    Status  On-going    Target Date  08/20/19      PT LONG TERM GOAL #3   Title  Pt to demonstrate improved SLS >25sec bilat    Baseline  ~5-7sec at eval; unchanged at visit 10    Time  8    Period  Weeks    Status  On-going    Target Date  08/20/19      PT LONG TERM GOAL #4   Title  Pt to demonstrate 4+/5 hip ABDCT strength bilat.    Baseline  4-/5 at eval    Time  8    Period  Weeks    Status  On-going     Target Date  08/20/19            Plan - 08/16/19 1133    Clinical Impression Statement  Patient motivated and participated well within session. She is progressing with SLS tasks with improved weight shift and better stance control. She continues to have weakness in BLE but is progressing in strengthening. Patient also has decreased ankle strategies with increased instability during SLS. 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;Bend;Locomotion Level;Lift    Examination-Participation Restrictions  Shop;Cleaning;Community Activity;Yard Work    Stability/Clinical Decision Making  Stable/Uncomplicated    Rehab Potential  Good    PT Frequency  2x / week    PT Duration  8 weeks    PT Treatment/Interventions  ADLs/Self Care Home Management;Electrical Stimulation;Moist Heat;Stair training;Gait training;DME Instruction;Functional mobility training;Therapeutic activities;Therapeutic exercise;Balance training;Neuromuscular re-education;Patient/family education;Manual techniques;Passive range of motion;Dry needling;Vestibular;Taping    PT Next Visit Plan  Review goals, HEP; work on ankle strength, hip strength, and dynamic balance training    PT Home Exercise Plan  3x60sec heel strike AMB; 1x15 heel raises, 1x15 wall leaning dorsiflexion    Consulted and Agree with Plan of Care  Patient       Patient will benefit from skilled therapeutic intervention in order to improve the  following deficits and impairments:  Abnormal gait, Decreased balance, Decreased endurance, Difficulty walking, Hypomobility, Increased muscle spasms, Decreased knowledge of precautions, Decreased range of motion, Decreased activity tolerance, Decreased coordination, Decreased knowledge of use of DME, Decreased strength,  Postural dysfunction  Visit Diagnosis: Stiffness of left ankle, not elsewhere classified  Unsteadiness on feet     Problem List Patient Active Problem List   Diagnosis Date Noted  . Closed fracture of proximal end of right humerus 01/10/2019  . Chronic venous insufficiency 09/06/2018  . Lymphedema 09/06/2018  . GERD without esophagitis 03/10/2018  . Bilateral dry eyes 03/10/2018  . COPD (chronic obstructive pulmonary disease) ([place]) 03/04/2018  . Acute exacerbation of COPD with asthma ([place]) 02/22/2018  . Chronic anemia 02/22/2018  . Chronic constipation 02/22/2018  . Chronic bilateral thoracic back pain 02/22/2018  . Closed displaced fracture of right femoral neck ([place]) 02/22/2018  . Congenital heart failure ([place])   . Status post hip hemiarthroplasty 02/12/2018  . Hip fracture ([place]) 02/11/2018  . Asthma 04/30/2017  . Chronic reflux esophagitis 04/30/2017  . Hyperlipemia 04/30/2017  . Osteoporosis 04/30/2017  . Cardiac murmur, previously undiagnosed 04/30/2017  . Diverticulosis of small intestine without hemorrhage 04/30/2017  . Dyspnea on exertion 04/30/2017  . Hx of adenomatous colonic polyps 04/30/2017  . Hx of cardiomegaly 04/30/2017  . Lumbar arthropathy 04/30/2017  . Nasopharyngitis 04/30/2017  . Nonspecific chest pain 04/30/2017  . Osteoarthropathy 04/30/2017  . Leg swelling 04/30/2017  . Sinusitis, acute 04/30/2017  . History of bone density study 06/28/2004    Dolph Tavano PT, DPT 08/16/2019, 11:42 AM  [place] MAIN [name] SERVICES [address] [place], [place], [address] Phone: [phone]   Fax:  [phone]  Name: Teresa Chambers MRN: [id number] Date of Birth: 22-Nov-1935

## 2019-08-18 ENCOUNTER — Encounter: Payer: Self-pay | Admitting: Physical Therapy

## 2019-08-18 ENCOUNTER — Other Ambulatory Visit: Payer: Self-pay

## 2019-08-18 ENCOUNTER — Ambulatory Visit: Payer: PPO | Admitting: Physical Therapy

## 2019-08-18 DIAGNOSIS — M25672 Stiffness of left ankle, not elsewhere classified: Secondary | ICD-10-CM

## 2019-08-18 DIAGNOSIS — R2681 Unsteadiness on feet: Secondary | ICD-10-CM

## 2019-08-18 NOTE — Therapy (Signed)
[place] MAIN [facility] SERVICES [address] [place], [place], [address] Phone: [phone]   Fax:  [phone]  Physical Therapy Treatment  Patient Details  Name: Teresa Chambers MRN: [id number] Date of Birth: 07-11-1936 No data recorded  Encounter Date: 08/18/2019  PT End of Session - 08/18/19 0932    Visit Number  16    Number of Visits  20    Date for PT Re-Evaluation  09/15/19    Authorization Type  HTA medicare    Authorization Time Period  06/20/19-08/19/18    PT Start Time  0932    PT Stop Time  1015    PT Time Calculation (min)  43 min    Equipment Utilized During Treatment  Gait belt    Activity Tolerance  Patient tolerated treatment well;No increased pain    Behavior During Therapy  WFL for tasks assessed/performed       Past Medical History:  Diagnosis Date  . Acute thoracic back pain   . Asthma   . Chest pain    unspecified  . CHF (congestive heart failure) ([place]) 04/30/2017  . Chronic abdominal pain 05/01/2017   unspecified  . Congenital heart failure ([place])   . COPD (chronic obstructive pulmonary disease) ([place])   . Disease of upper respiratory system 04/30/2017  . Dyspnea on exertion   . Esophageal reflux disease 04/30/2017  . History of bone density study 06/28/2004  . Leg swelling   . Lumbar arthropathy   . Nasopharyngitis 04/30/2017  . Osteoarthropathy 04/30/2017  . Osteoporosis 04/30/2017  . Palpitations   . Sinusitis, acute 04/30/2017   unspecified    Past Surgical History:  Procedure Laterality Date  . CATARACT EXTRACTION     07/21/2001 - 07/20/2002  . CHOLECYSTECTOMY     07/21/1997 - 07/20/1998  . COLON SURGERY    . COLONOSCOPY     05/05/2000, 01/17/2000 Adenomatous Polyps   . COLONOSCOPY     11/05/2009, 12/23/2004, 07/07/2001   PH Adenomatous Polyps: CBF 10/2014; recall ltr mailed 09/18/2014 (dw)  . ESOPHAGOGASTRODUODENOSCOPY     11/05/2009, 05/05/2000  . FLEXIBLE SIGMOIDOSCOPY  11/28/1999  . HAND  SURGERY  2006  . HERNIA REPAIR    . HIP ARTHROPLASTY Right 02/11/2018   Procedure: ARTHROPLASTY BIPOLAR HIP (HEMIARTHROPLASTY);  Surgeon: Corky Mull, MD;  Location: [facility] ORS;  Service: Orthopedics;  Laterality: Right;  . LAPAROSCOPIC COLON RESECTION     2001    There were no vitals filed for this visit.  Subjective Assessment - 08/18/19 0931    Subjective  Patient reports doing well; Reports adherence with HEP. She reports still having trouble with balancing on one foot.    Pertinent History  Teresa Chambers is an 85yoF who comes to [facility] OPP[facility] for. PMH: falls s/p Rt hip fracture and Rt HHA July 2019, COPD, asthma, CHF, hernia repair. chronic venous insufficiency, Right shoulder fracture June 2020 managed conservatively and PT 20x.Pt is referred to OPPT for ongoing balance issues and several falls over the past 2 years. Pt reports some difficulty managing her feet when walking.    Currently in Pain?  No/denies    Pain Onset  More than a month ago    Multiple Pain Sites  No         [facility] PT Assessment - 08/18/19 0001      6 minute walk test results    Aerobic Endurance Distance Walked  930    Endurance additional comments  without AD with  good foot clearance bilaterally and no unsteadiness           TREATMENT: Warm up on Nustep BUE/BLE level 2 x4 min(unbilled);  Patient instructed in advanced balance exercise  Standing on airex: -SLS unsupported 15 sec hold x2 reps each LE -SLS unsupported:  Ball pass side/side x5 reps each direction -SLS heel raises with intermittent rail assist x15 reps bilaterally  Required CGA to close supervision for safety and cues for proper weight shift for better stance control;  BAPs board, level 3: ankle DF/PF, IV/EV x10 reps each with B rail assist, min A for safety and min Vcs to isolate ROM for better ankle stability;   Standing on firm surface: SLS: 10-12 sec each LE x3 attempts, supervision with good standing balance and  safety awareness.  Exercise: Leg press:  BLE plate 40# [id number] with min VCS for proper positioning and to slow down LE movement for better strengthening. Patient did require min A for getting on/off equipment for safety;   PT assessed LE strength: grossly 4+/5 bilaterally; Patient exhibits better hip control with minimal difficulty during MMT.  Pt able to complete sit<>Stand from regular height chair without pushing on arm rests, mod I with good control. She does have valgus position of BLE knee but this is likely not going to change due to BLE OA. Pt not instructed in 5 times sit<>Stand to reduce knee discomfort. Pt is tall and is at a mechanical disadvantage from a regular height chair. Will discontinue goal for 5 times sit<>Stand at this time.   Instructed patient in 6 min walk test, see above:   Response to treatment: Tolerated well. Pt instructed in advanced SLS tasks to challenge ankle stabilization and control. She does exhibit increased mobility with decreased stability especially on compliant surface; Patient also exhibits increased knee valgus with ankle IV especially during SLS which could be contributing to imbalance; Patient does require min-mod VCs for correct exercise technique/positioning for optimal muscle activation;  Updated goals, see above;                      PT Education - 08/18/19 0931    Education Details  LE strength, exercise technique, balance, HEP    Person(s) Educated  Patient    Methods  Explanation;Verbal cues    Comprehension  Verbalized understanding;Returned demonstration;Verbal cues required;Need further instruction       PT Short Term Goals - 08/18/19 1054      PT SHORT TERM GOAL #1   Title  After 4 weeks pt  will improve 6 min walk test to >1000 feet for improved community ambulator.    Baseline  2MWT at evaluation 0.33m/s (unable to go farther d/t back pain/fatigue); on 07/25/19 2MWT 332ft @ 0.12m/s, 1/28: 6 min walk: 930 feet     Time  4    Period  Weeks    Status  Revised    Target Date  09/15/19      PT SHORT TERM GOAL #2   Title  After 4 weeks pt to demonstrate SLS>10sec bilat.    Baseline  At eval ~5-7sec bilat; unchanged at visit 10 (07/25/19), 1/28: 10-12 sec consistently;    Time  4    Period  Weeks    Status  Achieved    Target Date  07/20/19        PT Long Term Goals - 08/18/19 0956      PT LONG TERM GOAL #1   Title  Pt  to demonstrate improved SLS heel raise to >15 bilat without loss of height.    Baseline  Loss of height after 7x bilat; 1/28: able to complete 15 repetitions with good ROM and control;    Time  8    Period  Weeks    Status  Achieved      PT LONG TERM GOAL #2   Title  Pt to demonstrate 5xSTS <11sec to demonstrate improved funcitonal ankle/hip strength and coordiantion.    Baseline  ~15sec at eval; unchanged at visit 10, 1/28: pt able to transfer without pushing on chair, but does have pain/difficulty due to OA, unlikely to change, goal discontinued.    Time  8    Period  Weeks    Status  Deferred      PT LONG TERM GOAL #3   Title  Pt to demonstrate improved SLS >15 sec bilaterally to improve safety with ADLs.    Baseline  ~5-7sec at eval; unchanged at visit 10; 1/28: SLS on firm surface: 10-12 sec consistently;    Time  4    Period  Weeks    Status  Revised    Target Date  09/15/19      PT LONG TERM GOAL #4   Title  Pt to demonstrate 4+/5 hip ABDCT strength bilat.    Baseline  4-/5 at eval, 1/28: 4+/5 bilaterally;    Time  8    Period  Weeks    Status  Achieved            Plan - 08/18/19 1036    Clinical Impression Statement  Patient motivated and participated well within session. Patient is progressing well with SLS tasks. She is able to hold SLS for 10-12 sec consistently which is an improvement. Patient also exhibits improvement in LE strength. Patient continues to have knee valgus position and has difficulty with sit<>Stand transfers from regular height  surface due to height. Patient is able to complete a sit<>Stand transfer without pushing on arm rests but reports increased knee discomfort due to osteoarthritis. This is likely not going to change. She is able to complete 930 feet on 6 min walk test without foot dragging but does get fatigued. Patient would benefit from additional skilled PT Intervention to improve dynamic balance, ankle stability and advance HEP for transition to independent exercise and return to PLOF.    Personal Factors and Comorbidities  Age;Behavior Pattern;Past/Current Experience;Fitness    Armed forces logistics/support/administrative officer;Reach Overhead;Bend;Locomotion Level;Lift    Examination-Participation Restrictions  Shop;Cleaning;Community Activity;Yard Work    Stability/Clinical Decision Making  Stable/Uncomplicated    Rehab Potential  Good    PT Frequency  1x / week    PT Duration  4 weeks    PT Treatment/Interventions  ADLs/Self Care Home Management;Electrical Stimulation;Moist Heat;Stair training;Gait training;DME Instruction;Functional mobility training;Therapeutic activities;Therapeutic exercise;Balance training;Neuromuscular re-education;Patient/family education;Manual techniques;Passive range of motion;Dry needling;Vestibular;Taping    PT Next Visit Plan  Review goals, HEP; work on ankle strength, hip strength, and dynamic balance training    PT Home Exercise Plan  3x60sec heel strike AMB; 1x15 heel raises, 1x15 wall leaning dorsiflexion    Consulted and Agree with Plan of Care  Patient       Patient will benefit from skilled therapeutic intervention in order to improve the following deficits and impairments:  Abnormal gait, Decreased balance, Decreased endurance, Difficulty walking, Hypomobility, Increased muscle spasms, Decreased knowledge of precautions, Decreased range of motion, Decreased activity tolerance, Decreased coordination, Decreased knowledge of use of DME, Decreased  strength, Postural  dysfunction  Visit Diagnosis: Stiffness of left ankle, not elsewhere classified  Unsteadiness on feet     Problem List Patient Active Problem List   Diagnosis Date Noted  . Closed fracture of proximal end of right humerus 01/10/2019  . Chronic venous insufficiency 09/06/2018  . Lymphedema 09/06/2018  . GERD without esophagitis 03/10/2018  . Bilateral dry eyes 03/10/2018  . COPD (chronic obstructive pulmonary disease) ([place]) 03/04/2018  . Acute exacerbation of COPD with asthma ([place]) 02/22/2018  . Chronic anemia 02/22/2018  . Chronic constipation 02/22/2018  . Chronic bilateral thoracic back pain 02/22/2018  . Closed displaced fracture of right femoral neck ([place]) 02/22/2018  . Congenital heart failure ([place])   . Status post hip hemiarthroplasty 02/12/2018  . Hip fracture ([place]) 02/11/2018  . Asthma 04/30/2017  . Chronic reflux esophagitis 04/30/2017  . Hyperlipemia 04/30/2017  . Osteoporosis 04/30/2017  . Cardiac murmur, previously undiagnosed 04/30/2017  . Diverticulosis of small intestine without hemorrhage 04/30/2017  . Dyspnea on exertion 04/30/2017  . Hx of adenomatous colonic polyps 04/30/2017  . Hx of cardiomegaly 04/30/2017  . Lumbar arthropathy 04/30/2017  . Nasopharyngitis 04/30/2017  . Nonspecific chest pain 04/30/2017  . Osteoarthropathy 04/30/2017  . Leg swelling 04/30/2017  . Sinusitis, acute 04/30/2017  . History of bone density study 06/28/2004    Teresa Chambers PT, DPT 08/18/2019, 11:17 AM  [place] MAIN [facility] SERVICES [address] [place], [place], [address] Phone: [phone]   Fax:  [phone]  Name: Teresa Chambers MRN: [name] Date of Birth: 09-16-1935

## 2019-08-22 ENCOUNTER — Ambulatory Visit: Payer: PPO | Admitting: Physical Therapy

## 2019-08-24 ENCOUNTER — Other Ambulatory Visit: Payer: Self-pay

## 2019-08-24 ENCOUNTER — Ambulatory Visit: Payer: PPO | Attending: Internal Medicine | Admitting: Physical Therapy

## 2019-08-24 ENCOUNTER — Encounter: Payer: Self-pay | Admitting: Physical Therapy

## 2019-08-24 DIAGNOSIS — M25672 Stiffness of left ankle, not elsewhere classified: Secondary | ICD-10-CM | POA: Diagnosis not present

## 2019-08-24 DIAGNOSIS — R2681 Unsteadiness on feet: Secondary | ICD-10-CM | POA: Diagnosis not present

## 2019-08-24 NOTE — Patient Instructions (Signed)
Access Code: [email or web address]  URL: [email or web address]  Date: 08/24/2019  Prepared by: Blanche East   Exercises  Seated Lumbar Flexion Stretch - 10 reps - 2 sets - 2 hold - 1x daily - 7x weekly

## 2019-08-24 NOTE — Therapy (Signed)
[place] MAIN [facility] SERVICES [address] [place], [place], [address] Phone: [phone]   Fax:  [phone]  Physical Therapy Treatment  Patient Details  Name: Teresa Chambers MRN: [name] Date of Birth: 05-Oct-1935 No data recorded  Encounter Date: 08/24/2019  PT End of Session - 08/24/19 1140    Visit Number  17    Number of Visits  20    Date for PT Re-Evaluation  09/15/19    Authorization Type  HTA medicare    Authorization Time Period  06/20/19-08/19/18    PT Start Time  1143    PT Stop Time  1225    PT Time Calculation (min)  42 min    Equipment Utilized During Treatment  Gait belt    Activity Tolerance  Patient tolerated treatment well;No increased pain    Behavior During Therapy  WFL for tasks assessed/performed       Past Medical History:  Diagnosis Date  . Acute thoracic back pain   . Asthma   . Chest pain    unspecified  . CHF (congestive heart failure) ([place]) 04/30/2017  . Chronic abdominal pain 05/01/2017   unspecified  . Congenital heart failure ([place])   . COPD (chronic obstructive pulmonary disease) ([place])   . Disease of upper respiratory system 04/30/2017  . Dyspnea on exertion   . Esophageal reflux disease 04/30/2017  . History of bone density study 06/28/2004  . Leg swelling   . Lumbar arthropathy   . Nasopharyngitis 04/30/2017  . Osteoarthropathy 04/30/2017  . Osteoporosis 04/30/2017  . Palpitations   . Sinusitis, acute 04/30/2017   unspecified    Past Surgical History:  Procedure Laterality Date  . CATARACT EXTRACTION     07/21/2001 - 07/20/2002  . CHOLECYSTECTOMY     07/21/1997 - 07/20/1998  . COLON SURGERY    . COLONOSCOPY     05/05/2000, 01/17/2000 Adenomatous Polyps   . COLONOSCOPY     11/05/2009, 12/23/2004, 07/07/2001   PH Adenomatous Polyps: CBF 10/2014; recall ltr mailed 09/18/2014 (dw)  . ESOPHAGOGASTRODUODENOSCOPY     11/05/2009, 05/05/2000  . FLEXIBLE SIGMOIDOSCOPY  11/28/1999  . HAND  SURGERY  2006  . HERNIA REPAIR    . HIP ARTHROPLASTY Right 02/11/2018   Procedure: ARTHROPLASTY BIPOLAR HIP (HEMIARTHROPLASTY);  Surgeon: Corky Mull, MD;  Location: [facility] ORS;  Service: Orthopedics;  Laterality: Right;  . LAPAROSCOPIC COLON RESECTION     2001    There were no vitals filed for this visit.  Subjective Assessment - 08/24/19 1145    Subjective  Patient reports doing well. She reports doing a lot of walking yesterday. She denies any pain today. She reports still having trouble with bending down to the floor.    Pertinent History  Leslee Holton is an 11yoF who comes to [facility] OPPT for. PMH: falls s/p Rt hip fracture and Rt HHA July 2019, COPD, asthma, CHF, hernia repair. chronic venous insufficiency, Right shoulder fracture June 2020 managed conservatively and PT 20x.Pt is referred to OPPT for ongoing balance issues and several falls over the past 2 years. Pt reports some difficulty managing her feet when walking.    Currently in Pain?  No/denies    Pain Onset  More than a month ago    Multiple Pain Sites  No            TREATMENT: Warm up on crosstrainer, level 2 BUE/BLE x4 min (unbilled);  Patient instructed in advanced balance exercise    Standing on airex: -SLS unsupported 15 sec hold x2 reps each LE -SLS unsupported:             Ball pass side/side x5 reps each direction -SLS heel raises with intermittent rail assist x15 reps bilaterally  Required CGA to close supervision for safety and cues for proper weight shift for better stance control;    BAPs board, level 3: ankle DF/PF, IV/EV, circles clockwise x10 reps each with B rail assist, min A for safety and min Vcs to isolate ROM for better ankle stability;    Ladder Drills: Forward reciprocal x2 laps unsupported with cues for ankle DF at heel strike for better foot clearance; Forward step with contralateral LE hip flexion for SLS stance 3 sec hold x2 laps with min A for safety; Side stepping x2 laps each  direction with cues to increase step length and increase velocity for better dynamic balance;    Exercise:   Side lunges with ball chest press x5 reps, discontinued due to increased crepitis and knee discomfort;   Instructed patient in hip/lumbar flexion exercise for bending down: Seated with legs wide apart, reaching down towards floor 3 sec hold x10 reps; Required min VCS for proper positioning including to avoid hip adduction/IR.  Standing mini squat with legs wide apart trying to touch the floor, x5 reps; Pt had increased difficulty with increased knee pain with flexion;       Response to treatment: Tolerated well.  Pt instructed in advanced SLS tasks to challenge ankle stabilization and control. She does exhibit increased mobility with decreased stability especially on compliant surface; Patient also exhibits increased knee valgus with ankle IV especially during SLS which could be contributing to imbalance; Patient does require min-mod VCs for correct exercise technique/positioning for optimal muscle activation;                          PT Education - 08/24/19 1140    Education Details  LE strength, exercise technique, balance, HEP    Person(s) Educated  Patient    Methods  Explanation;Verbal cues    Comprehension  Verbalized understanding;Returned demonstration;Verbal cues required;Need further instruction       PT Short Term Goals - 08/18/19 1054      PT SHORT TERM GOAL #1   Title  After 4 weeks pt  will improve 6 min walk test to >1000 feet for improved community ambulator.    Baseline  2MWT at evaluation 0.79m/s (unable to go farther d/t back pain/fatigue); on 07/25/19 2MWT 312ft @ 0.70m/s, 1/28: 6 min walk: 930 feet    Time  4    Period  Weeks    Status  Revised    Target Date  09/15/19      PT SHORT TERM GOAL #2   Title  After 4 weeks pt to demonstrate SLS>10sec bilat.    Baseline  At eval ~5-7sec bilat; unchanged at visit 10 (07/25/19), 1/28: 10-12 sec  consistently;    Time  4    Period  Weeks    Status  Achieved    Target Date  07/20/19        PT Long Term Goals - 08/18/19 0956      PT LONG TERM GOAL #1   Title  Pt to demonstrate improved SLS heel raise to >15 bilat without loss of height.    Baseline  Loss of height after 7x bilat;  1/28: able to complete 15 repetitions with good ROM and control;    Time  8    Period  Weeks    Status  Achieved      PT LONG TERM GOAL #2   Title  Pt to demonstrate 5xSTS <11sec to demonstrate improved funcitonal ankle/hip strength and coordiantion.    Baseline  ~15sec at eval; unchanged at visit 10, 1/28: pt able to transfer without pushing on chair, but does have pain/difficulty due to OA, unlikely to change, goal discontinued.    Time  8    Period  Weeks    Status  Deferred      PT LONG TERM GOAL #3   Title  Pt to demonstrate improved SLS >15 sec bilaterally to improve safety with ADLs.    Baseline  ~5-7sec at eval; unchanged at visit 10; 1/28: SLS on firm surface: 10-12 sec consistently;    Time  4    Period  Weeks    Status  Revised    Target Date  09/15/19      PT LONG TERM GOAL #4   Title  Pt to demonstrate 4+/5 hip ABDCT strength bilat.    Baseline  4-/5 at eval, 1/28: 4+/5 bilaterally;    Time  8    Period  Weeks    Status  Achieved            Plan - 08/24/19 1228    Clinical Impression Statement  Patient motivated and particpated well within session. She was able to exhibit better motor control on BAPs board but continues to require min A for safety. Patient instructed in advanced LE strengthening exercise, she did have significant difficulty with side lunges due to knee discomfort and crepitis. Patient was able to exhibit good foot clearance with ladder drills. She would benefit from additional skilled PT intervention to improve strength, balance and gait safety;    Personal Factors and Comorbidities  Age;Behavior Pattern;Past/Current Experience;Fitness     Armed forces logistics/support/administrative officer;Reach Overhead;Bend;Locomotion Level;Lift    Examination-Participation Restrictions  Shop;Cleaning;Community Activity;Yard Work    Stability/Clinical Decision Making  Stable/Uncomplicated    Rehab Potential  Good    PT Frequency  1x / week    PT Duration  4 weeks    PT Treatment/Interventions  ADLs/Self Care Home Management;Electrical Stimulation;Moist Heat;Stair training;Gait training;DME Instruction;Functional mobility training;Therapeutic activities;Therapeutic exercise;Balance training;Neuromuscular re-education;Patient/family education;Manual techniques;Passive range of motion;Dry needling;Vestibular;Taping    PT Next Visit Plan  Review goals, HEP; work on ankle strength, hip strength, and dynamic balance training    PT Home Exercise Plan  3x60sec heel strike AMB; 1x15 heel raises, 1x15 wall leaning dorsiflexion    Consulted and Agree with Plan of Care  Patient       Patient will benefit from skilled therapeutic intervention in order to improve the following deficits and impairments:  Abnormal gait, Decreased balance, Decreased endurance, Difficulty walking, Hypomobility, Increased muscle spasms, Decreased knowledge of precautions, Decreased range of motion, Decreased activity tolerance, Decreased coordination, Decreased knowledge of use of DME, Decreased strength, Postural dysfunction  Visit Diagnosis: Stiffness of left ankle, not elsewhere classified  Unsteadiness on feet     Problem List Patient Active Problem List   Diagnosis Date Noted  . Closed fracture of proximal end of right humerus 01/10/2019  . Chronic venous insufficiency 09/06/2018  . Lymphedema 09/06/2018  . GERD without esophagitis 03/10/2018  . Bilateral dry eyes 03/10/2018  . COPD (chronic obstructive pulmonary disease) ([place]) 03/04/2018  . Acute exacerbation of  COPD with asthma ([place]) 02/22/2018  . Chronic anemia 02/22/2018  . Chronic constipation 02/22/2018  .  Chronic bilateral thoracic back pain 02/22/2018  . Closed displaced fracture of right femoral neck ([place]) 02/22/2018  . Congenital heart failure ([place])   . Status post hip hemiarthroplasty 02/12/2018  . Hip fracture ([place]) 02/11/2018  . Asthma 04/30/2017  . Chronic reflux esophagitis 04/30/2017  . Hyperlipemia 04/30/2017  . Osteoporosis 04/30/2017  . Cardiac murmur, previously undiagnosed 04/30/2017  . Diverticulosis of small intestine without hemorrhage 04/30/2017  . Dyspnea on exertion 04/30/2017  . Hx of adenomatous colonic polyps 04/30/2017  . Hx of cardiomegaly 04/30/2017  . Lumbar arthropathy 04/30/2017  . Nasopharyngitis 04/30/2017  . Nonspecific chest pain 04/30/2017  . Osteoarthropathy 04/30/2017  . Leg swelling 04/30/2017  . Sinusitis, acute 04/30/2017  . History of bone density study 06/28/2004    Trotter,Margaret PT, DPT 08/24/2019, 12:30 PM  [place] MAIN [facility] SERVICES [address] [place], [place], [address] Phone: [phone]   Fax:  [phone]  Name: Teresa Chambers MRN: [id number] Date of Birth: [birth year]

## 2019-08-29 ENCOUNTER — Ambulatory Visit: Payer: PPO

## 2019-08-30 ENCOUNTER — Encounter: Payer: Self-pay | Admitting: Physical Therapy

## 2019-08-30 ENCOUNTER — Ambulatory Visit: Payer: PPO | Admitting: Physical Therapy

## 2019-08-30 ENCOUNTER — Other Ambulatory Visit: Payer: Self-pay

## 2019-08-30 DIAGNOSIS — M25672 Stiffness of left ankle, not elsewhere classified: Secondary | ICD-10-CM | POA: Diagnosis not present

## 2019-08-30 DIAGNOSIS — R2681 Unsteadiness on feet: Secondary | ICD-10-CM

## 2019-08-30 NOTE — Therapy (Signed)
[place] MAIN [facility] SERVICES [address] [place], [place], [address] Phone: [phone]   Fax:  [phone]  Physical Therapy Treatment  Patient Details  Name: Teresa Chambers MRN: [id number] Date of Birth: August 27, 1935 No data recorded  Encounter Date: 08/30/2019  PT End of Session - 08/30/19 1335    Visit Number  18    Number of Visits  20    Date for PT Re-Evaluation  09/15/19    Authorization Type  HTA medicare    Authorization Time Period  06/20/19-08/19/18    PT Start Time  1332    PT Stop Time  1415    PT Time Calculation (min)  43 min    Equipment Utilized During Treatment  Gait belt    Activity Tolerance  Patient tolerated treatment well;No increased pain    Behavior During Therapy  WFL for tasks assessed/performed       Past Medical History:  Diagnosis Date  . Acute thoracic back pain   . Asthma   . Chest pain    unspecified  . CHF (congestive heart failure) ([place]) 04/30/2017  . Chronic abdominal pain 05/01/2017   unspecified  . Congenital heart failure ([place])   . COPD (chronic obstructive pulmonary disease) ([place])   . Disease of upper respiratory system 04/30/2017  . Dyspnea on exertion   . Esophageal reflux disease 04/30/2017  . History of bone density study 06/28/2004  . Leg swelling   . Lumbar arthropathy   . Nasopharyngitis 04/30/2017  . Osteoarthropathy 04/30/2017  . Osteoporosis 04/30/2017  . Palpitations   . Sinusitis, acute 04/30/2017   unspecified    Past Surgical History:  Procedure Laterality Date  . CATARACT EXTRACTION     07/21/2001 - 07/20/2002  . CHOLECYSTECTOMY     07/21/1997 - 07/20/1998  . COLON SURGERY    . COLONOSCOPY     05/05/2000, 01/17/2000 Adenomatous Polyps   . COLONOSCOPY     11/05/2009, 12/23/2004, 07/07/2001   PH Adenomatous Polyps: CBF 10/2014; recall ltr mailed 09/18/2014 (dw)  . ESOPHAGOGASTRODUODENOSCOPY     11/05/2009, 05/05/2000  . FLEXIBLE SIGMOIDOSCOPY  11/28/1999  . HAND  SURGERY  2006  . HERNIA REPAIR    . HIP ARTHROPLASTY Right 02/11/2018   Procedure: ARTHROPLASTY BIPOLAR HIP (HEMIARTHROPLASTY);  Surgeon: Corky Mull, MD;  Location: [facility] ORS;  Service: Orthopedics;  Laterality: Right;  . LAPAROSCOPIC COLON RESECTION     2001    There were no vitals filed for this visit.  Subjective Assessment - 08/30/19 1335    Subjective  Patient reports doing well; Denies any pain; She reports feeling like she is doing better.    Pertinent History  Teresa Chambers is an 62yoF who comes to [name] OPPT for. PMH: falls s/p Rt hip fracture and Rt HHA July 2019, COPD, asthma, CHF, hernia repair. chronic venous insufficiency, Right shoulder fracture June 2020 managed conservatively and PT 20x.Pt is referred to OPPT for ongoing balance issues and several falls over the past 2 years. Pt reports some difficulty managing her feet when walking.    Currently in Pain?  No/denies    Pain Onset  More than a month ago           TREATMENT: Warm up on crosstrainer, level 2 BUE/BLE x3 min (unbilled);               Patient instructed in advanced balance exercise    Standing on green dynadisc:  -SLS unsupported 15 sec hold  x2 reps each LE -SLS unsupported:             Ball pass side/side x5 reps each direction -SLS heel raises with intermittent rail assist x15 reps bilaterally  Required CGA to close supervision for safety and cues for proper weight shift for better stance control;      Ladder Drills: Forward reciprocal x2 laps unsupported with cues for ankle DF at heel strike for better foot clearance; Forward step with contralateral LE hip flexion for SLS stance 3 sec hold x2 laps with min A for safety; Progressed to forward step with contralateral LE hip flexion SLS with head turns side/side x2 laps Side stepping x2 laps each direction with head erect, not looking down,  with cues to increase step length and increase velocity for better dynamic balance;    Exercise:  Leg press:   BLE plate 55# [id number] with min VCS for proper positioning and to slow down LE movement for better strengthening. Patient did require min A for getting on/off equipment for safety;  Instructed patient in seated hip ER ROM:  Seated figure 4 stretch 20 sec hold x2 reps bilaterally; Patient required min VCs for proper positioning and exercise technique. She had significant tightness in RLE being unable to achieve full figure 4 position;       Response to treatment: Tolerated well.  Pt instructed in advanced SLS tasks to challenge ankle stabilization and control. She does exhibit increased mobility with decreased stability especially on compliant surface; Patient also exhibits increased knee valgus with ankle IV especially during SLS which could be contributing to imbalance; Patient does require min-mod VCs for correct exercise technique/positioning for optimal muscle activation                      PT Education - 08/30/19 1335    Education Details  LE strength, balance, HEP    Person(s) Educated  Patient    Methods  Explanation;Verbal cues    Comprehension  Verbalized understanding;Returned demonstration;Verbal cues required;Need further instruction       PT Short Term Goals - 08/18/19 1054      PT SHORT TERM GOAL #1   Title  After 4 weeks pt  will improve 6 min walk test to >1000 feet for improved community ambulator.    Baseline  2MWT at evaluation 0.97m/s (unable to go farther d/t back pain/fatigue); on 07/25/19 2MWT 377ft @ 0.71m/s, 1/28: 6 min walk: 930 feet    Time  4    Period  Weeks    Status  Revised    Target Date  09/15/19      PT SHORT TERM GOAL #2   Title  After 4 weeks pt to demonstrate SLS>10sec bilat.    Baseline  At eval ~5-7sec bilat; unchanged at visit 10 (07/25/19), 1/28: 10-12 sec consistently;    Time  4    Period  Weeks    Status  Achieved    Target Date  07/20/19        PT Long Term Goals - 08/18/19 0956      PT LONG TERM GOAL #1   Title   Pt to demonstrate improved SLS heel raise to >15 bilat without loss of height.    Baseline  Loss of height after 7x bilat; 1/28: able to complete 15 repetitions with good ROM and control;    Time  8    Period  Weeks    Status  Achieved  PT LONG TERM GOAL #2   Title  Pt to demonstrate 5xSTS <11sec to demonstrate improved funcitonal ankle/hip strength and coordiantion.    Baseline  ~15sec at eval; unchanged at visit 10, 1/28: pt able to transfer without pushing on chair, but does have pain/difficulty due to OA, unlikely to change, goal discontinued.    Time  8    Period  Weeks    Status  Deferred      PT LONG TERM GOAL #3   Title  Pt to demonstrate improved SLS >15 sec bilaterally to improve safety with ADLs.    Baseline  ~5-7sec at eval; unchanged at visit 10; 1/28: SLS on firm surface: 10-12 sec consistently;    Time  4    Period  Weeks    Status  Revised    Target Date  09/15/19      PT LONG TERM GOAL #4   Title  Pt to demonstrate 4+/5 hip ABDCT strength bilat.    Baseline  4-/5 at eval, 1/28: 4+/5 bilaterally;    Time  8    Period  Weeks    Status  Achieved            Plan - 08/30/19 1421    Clinical Impression Statement  Patient motivated and participated well within session; Progress balance exercise, utilizing dyna disc to challenge SLS tasks. Patient does require min A with some balance exercise, especially with dynamic tasks such as turning head. She tolerated exercise well. Patient does require min VCS for correct exercise technique and proper positioning for optimal ROM/strength. She would benefit from additional skilled PT intervention to improve strength, balance and mobility;    Personal Factors and Comorbidities  Age;Behavior Pattern;Past/Current Experience;Fitness    Armed forces logistics/support/administrative officer;Reach Overhead;Bend;Locomotion Level;Lift    Examination-Participation Restrictions  Shop;Cleaning;Community Activity;Yard Work    Stability/Clinical  Decision Making  Stable/Uncomplicated    Rehab Potential  Good    PT Frequency  1x / week    PT Duration  4 weeks    PT Treatment/Interventions  ADLs/Self Care Home Management;Electrical Stimulation;Moist Heat;Stair training;Gait training;DME Instruction;Functional mobility training;Therapeutic activities;Therapeutic exercise;Balance training;Neuromuscular re-education;Patient/family education;Manual techniques;Passive range of motion;Dry needling;Vestibular;Taping    PT Next Visit Plan  Review goals, HEP; work on ankle strength, hip strength, and dynamic balance training    PT Home Exercise Plan  3x60sec heel strike AMB; 1x15 heel raises, 1x15 wall leaning dorsiflexion    Consulted and Agree with Plan of Care  Patient       Patient will benefit from skilled therapeutic intervention in order to improve the following deficits and impairments:  Abnormal gait, Decreased balance, Decreased endurance, Difficulty walking, Hypomobility, Increased muscle spasms, Decreased knowledge of precautions, Decreased range of motion, Decreased activity tolerance, Decreased coordination, Decreased knowledge of use of DME, Decreased strength, Postural dysfunction  Visit Diagnosis: Stiffness of left ankle, not elsewhere classified  Unsteadiness on feet     Problem List Patient Active Problem List   Diagnosis Date Noted  . Closed fracture of proximal end of right humerus 01/10/2019  . Chronic venous insufficiency 09/06/2018  . Lymphedema 09/06/2018  . GERD without esophagitis 03/10/2018  . Bilateral dry eyes 03/10/2018  . COPD (chronic obstructive pulmonary disease) ([place]) 03/04/2018  . Acute exacerbation of COPD with asthma ([place]) 02/22/2018  . Chronic anemia 02/22/2018  . Chronic constipation 02/22/2018  . Chronic bilateral thoracic back pain 02/22/2018  . Closed displaced fracture of right femoral neck ([place]) 02/22/2018  . Congenital heart failure ([place])   .  Status post hip hemiarthroplasty 02/12/2018   . Hip fracture ([place]) 02/11/2018  . Asthma 04/30/2017  . Chronic reflux esophagitis 04/30/2017  . Hyperlipemia 04/30/2017  . Osteoporosis 04/30/2017  . Cardiac murmur, previously undiagnosed 04/30/2017  . Diverticulosis of small intestine without hemorrhage 04/30/2017  . Dyspnea on exertion 04/30/2017  . Hx of adenomatous colonic polyps 04/30/2017  . Hx of cardiomegaly 04/30/2017  . Lumbar arthropathy 04/30/2017  . Nasopharyngitis 04/30/2017  . Nonspecific chest pain 04/30/2017  . Osteoarthropathy 04/30/2017  . Leg swelling 04/30/2017  . Sinusitis, acute 04/30/2017  . History of bone density study 06/28/2004    Joannah Gitlin PT, DPT 08/30/2019, 2:23 PM  [place] MAIN [facility] SERVICES [address] [place], [place], [address] Phone: [phone]   Fax:  [phone]  Name: Teresa Chambers MRN: [id number] Date of Birth: 1936/05/09

## 2019-08-30 NOTE — Patient Instructions (Signed)
Access Code: [email or web address]  URL: [email or web address]  Date: 08/30/2019  Prepared by: Blanche East   Exercises  Seated Figure 4 Piriformis Stretch - 3 reps - 3 sets - 30 hold - 1x daily - 7x weekly  Bent Knee Fallouts - 5 reps - 2 sets - 15 hold - 1x daily - 7x weekly

## 2019-09-01 ENCOUNTER — Ambulatory Visit: Payer: PPO | Admitting: Physical Therapy

## 2019-09-06 ENCOUNTER — Ambulatory Visit: Payer: PPO | Admitting: Physical Therapy

## 2019-09-06 ENCOUNTER — Other Ambulatory Visit: Payer: Self-pay

## 2019-09-06 ENCOUNTER — Encounter: Payer: Self-pay | Admitting: Physical Therapy

## 2019-09-06 DIAGNOSIS — M25672 Stiffness of left ankle, not elsewhere classified: Secondary | ICD-10-CM | POA: Diagnosis not present

## 2019-09-06 DIAGNOSIS — R2681 Unsteadiness on feet: Secondary | ICD-10-CM

## 2019-09-06 NOTE — Therapy (Signed)
[place] MAIN [facility] SERVICES [address] [place], [place], [address] Phone: [phone]   Fax:  [phone]  Physical Therapy Treatment  Patient Details  Name: Teresa Chambers MRN: [name] Date of Birth: Dec 08, 1935 No data recorded  Encounter Date: 09/06/2019  PT End of Session - 09/06/19 1109    Visit Number  19    Number of Visits  20    Date for PT Re-Evaluation  09/15/19    Authorization Type  HTA medicare    Authorization Time Period  06/20/19-08/19/18    PT Start Time  1104    PT Stop Time  1145    PT Time Calculation (min)  41 min    Equipment Utilized During Treatment  Gait belt    Activity Tolerance  Patient tolerated treatment well;No increased pain    Behavior During Therapy  WFL for tasks assessed/performed       Past Medical History:  Diagnosis Date  . Acute thoracic back pain   . Asthma   . Chest pain    unspecified  . CHF (congestive heart failure) ([place]) 04/30/2017  . Chronic abdominal pain 05/01/2017   unspecified  . Congenital heart failure ([place])   . COPD (chronic obstructive pulmonary disease) ([place])   . Disease of upper respiratory system 04/30/2017  . Dyspnea on exertion   . Esophageal reflux disease 04/30/2017  . History of bone density study 06/28/2004  . Leg swelling   . Lumbar arthropathy   . Nasopharyngitis 04/30/2017  . Osteoarthropathy 04/30/2017  . Osteoporosis 04/30/2017  . Palpitations   . Sinusitis, acute 04/30/2017   unspecified    Past Surgical History:  Procedure Laterality Date  . CATARACT EXTRACTION     07/21/2001 - 07/20/2002  . CHOLECYSTECTOMY     07/21/1997 - 07/20/1998  . COLON SURGERY    . COLONOSCOPY     05/05/2000, 01/17/2000 Adenomatous Polyps   . COLONOSCOPY     11/05/2009, 12/23/2004, 07/07/2001   PH Adenomatous Polyps: CBF 10/2014; recall ltr mailed 09/18/2014 (dw)  . ESOPHAGOGASTRODUODENOSCOPY     11/05/2009, 05/05/2000  . FLEXIBLE SIGMOIDOSCOPY  11/28/1999  . HAND  SURGERY  2006  . HERNIA REPAIR    . HIP ARTHROPLASTY Right 02/11/2018   Procedure: ARTHROPLASTY BIPOLAR HIP (HEMIARTHROPLASTY);  Surgeon: Corky Mull, MD;  Location: [facility] ORS;  Service: Orthopedics;  Laterality: Right;  . LAPAROSCOPIC COLON RESECTION     2001    There were no vitals filed for this visit.  Subjective Assessment - 09/06/19 1108    Subjective  Patient reports doing well; denies any pain; Reports adherence with HEP and states that she feels like her legs are getting better.    Pertinent History  Sevanah Bardon is an 40yoF who comes to [facility] OPPT for. PMH: falls s/p Rt hip fracture and Rt HHA July 2019, COPD, asthma, CHF, hernia repair. chronic venous insufficiency, Right shoulder fracture June 2020 managed conservatively and PT 20x.Pt is referred to OPPT for ongoing balance issues and several falls over the past 2 years. Pt reports some difficulty managing her feet when walking.    Currently in Pain?  No/denies    Pain Onset  More than a month ago    Multiple Pain Sites  No             TREATMENT: Warm up oncrosstrainer, level 2 BUE/BLE x3 min (unbilled);  Patient instructed in advanced balance exercise  Standing on green dynadisc:  -SLS unsupported 15 sec  hold x2 reps each LE -SLS unsupported: head turns side/side x5 reps each;  BUE arms raise x5 reps each; -SLS heel raises with intermittent rail assist x15 reps bilaterally Required CGAto close supervisionfor safety and cues for proper weight shift for better stance control;  Weaving around cones #6 x2 laps with supervision with good foot clearance and good step length;  Side stepping over cones #6 x2 laps each direction with CGA for safety with increased difficulty balancing especially on LLE. She required min VCS for increased hip flexion for better foot clearance and dynamic balance;  Exercise:  Leg press:  BLE plate 55# [id number] with min VCS for proper positioning and to slow down  LE movement for better strengthening. Patient did require min A for getting on/off equipment for safety;Utilized small ball between knees for better tolerance;  BLE heel raises 70# x15 with min VCS for proper positioning to isolate ankle strengthening;   Instructed patient in seated hip ER ROM:  Seated figure 4 stretch 20 sec hold x1 reps bilaterally; Patient required min VCs for proper positioning and exercise technique. Pt able to exhibit improved flexibility this session but still has difficulty especially on right side;  Seated: Reaching down towards floor with legs apart x10 reps with mild pull reported in low back;   Picking up cones #6 with increased difficulty reported with knee flexion due to knee discomfort/arthritis.  Response to treatment: Tolerated well. Pt instructed in advanced SLS tasks to challenge ankle stabilization and control. She does exhibit increased mobility with decreased stability especially on compliant surface; Patient also exhibits increased knee valgus with ankle IV especially during SLS which could be contributing to imbalance; Patient does require min-mod VCs for correct exercise technique/positioning for optimal muscle activation                     PT Education - 09/06/19 1108    Education Details  LE strength, balance, HEP    Person(s) Educated  Patient    Methods  Explanation;Verbal cues    Comprehension  Verbalized understanding;Returned demonstration;Verbal cues required;Need further instruction       PT Short Term Goals - 08/18/19 1054      PT SHORT TERM GOAL #1   Title  After 4 weeks pt  will improve 6 min walk test to >1000 feet for improved community ambulator.    Baseline  2MWT at evaluation 0.87m/s (unable to go farther d/t back pain/fatigue); on 07/25/19 2MWT 375ft @ 0.59m/s, 1/28: 6 min walk: 930 feet    Time  4    Period  Weeks    Status  Revised    Target Date  09/15/19      PT SHORT TERM GOAL #2   Title   After 4 weeks pt to demonstrate SLS>10sec bilat.    Baseline  At eval ~5-7sec bilat; unchanged at visit 10 (07/25/19), 1/28: 10-12 sec consistently;    Time  4    Period  Weeks    Status  Achieved    Target Date  07/20/19        PT Long Term Goals - 08/18/19 0956      PT LONG TERM GOAL #1   Title  Pt to demonstrate improved SLS heel raise to >15 bilat without loss of height.    Baseline  Loss of height after 7x bilat; 1/28: able to complete 15 repetitions with good ROM and control;    Time  8    Period  Weeks    Status  Achieved      PT LONG TERM GOAL #2   Title  Pt to demonstrate 5xSTS <11sec to demonstrate improved funcitonal ankle/hip strength and coordiantion.    Baseline  ~15sec at eval; unchanged at visit 10, 1/28: pt able to transfer without pushing on chair, but does have pain/difficulty due to OA, unlikely to change, goal discontinued.    Time  8    Period  Weeks    Status  Deferred      PT LONG TERM GOAL #3   Title  Pt to demonstrate improved SLS >15 sec bilaterally to improve safety with ADLs.    Baseline  ~5-7sec at eval; unchanged at visit 10; 1/28: SLS on firm surface: 10-12 sec consistently;    Time  4    Period  Weeks    Status  Revised    Target Date  09/15/19      PT LONG TERM GOAL #4   Title  Pt to demonstrate 4+/5 hip ABDCT strength bilat.    Baseline  4-/5 at eval, 1/28: 4+/5 bilaterally;    Time  8    Period  Weeks    Status  Achieved            Plan - 09/06/19 1144    Clinical Impression Statement  Patient motivated and participated well within session. She was able to progress balance with SLS on dyna disc with increased dynamic movement. Patient continues to require CGA with occasional min A for safety. She had increased difficulty side stepping over cones especially to left side due to difficulty with LLE SLS. Patient does exhibit improved flexibility with LE stretches, although is still imited in hip ER. She would benefit from additional  skilled PT intervention to improve strength, balance and gait safety. Reinforced HEP    Personal Factors and Comorbidities  Age;Behavior Pattern;Past/Current Experience;Fitness    Armed forces logistics/support/administrative officer;Reach Overhead;Bend;Locomotion Level;Lift    Examination-Participation Restrictions  Shop;Cleaning;Community Activity;Yard Work    Stability/Clinical Decision Making  Stable/Uncomplicated    Rehab Potential  Good    PT Frequency  1x / week    PT Duration  4 weeks    PT Treatment/Interventions  ADLs/Self Care Home Management;Electrical Stimulation;Moist Heat;Stair training;Gait training;DME Instruction;Functional mobility training;Therapeutic activities;Therapeutic exercise;Balance training;Neuromuscular re-education;Patient/family education;Manual techniques;Passive range of motion;Dry needling;Vestibular;Taping    PT Next Visit Plan  Review goals, HEP; work on ankle strength, hip strength, and dynamic balance training    PT Home Exercise Plan  3x60sec heel strike AMB; 1x15 heel raises, 1x15 wall leaning dorsiflexion    Consulted and Agree with Plan of Care  Patient       Patient will benefit from skilled therapeutic intervention in order to improve the following deficits and impairments:  Abnormal gait, Decreased balance, Decreased endurance, Difficulty walking, Hypomobility, Increased muscle spasms, Decreased knowledge of precautions, Decreased range of motion, Decreased activity tolerance, Decreased coordination, Decreased knowledge of use of DME, Decreased strength, Postural dysfunction  Visit Diagnosis: Stiffness of left ankle, not elsewhere classified  Unsteadiness on feet     Problem List Patient Active Problem List   Diagnosis Date Noted  . Closed fracture of proximal end of right humerus 01/10/2019  . Chronic venous insufficiency 09/06/2018  . Lymphedema 09/06/2018  . GERD without esophagitis 03/10/2018  . Bilateral dry eyes 03/10/2018  . COPD (chronic  obstructive pulmonary disease) ([place]) 03/04/2018  . Acute exacerbation of COPD with asthma ([place]) 02/22/2018  . Chronic anemia 02/22/2018  .  Chronic constipation 02/22/2018  . Chronic bilateral thoracic back pain 02/22/2018  . Closed displaced fracture of right femoral neck ([place]) 02/22/2018  . Congenital heart failure ([place])   . Status post hip hemiarthroplasty 02/12/2018  . Hip fracture ([place]) 02/11/2018  . Asthma 04/30/2017  . Chronic reflux esophagitis 04/30/2017  . Hyperlipemia 04/30/2017  . Osteoporosis 04/30/2017  . Cardiac murmur, previously undiagnosed 04/30/2017  . Diverticulosis of small intestine without hemorrhage 04/30/2017  . Dyspnea on exertion 04/30/2017  . Hx of adenomatous colonic polyps 04/30/2017  . Hx of cardiomegaly 04/30/2017  . Lumbar arthropathy 04/30/2017  . Nasopharyngitis 04/30/2017  . Nonspecific chest pain 04/30/2017  . Osteoarthropathy 04/30/2017  . Leg swelling 04/30/2017  . Sinusitis, acute 04/30/2017  . History of bone density study 06/28/2004    Kierre Deines PT, DPT 09/06/2019, 12:21 PM  [place] MAIN [facility] SERVICES [address] [place], [place], [address] Phone: [phone]   Fax:  [phone]  Name: Teresa Chambers MRN: [name] Date of Birth: 1936-05-31

## 2019-09-12 DIAGNOSIS — S72001S Fracture of unspecified part of neck of right femur, sequela: Secondary | ICD-10-CM | POA: Diagnosis not present

## 2019-09-12 DIAGNOSIS — I471 Supraventricular tachycardia: Secondary | ICD-10-CM | POA: Diagnosis not present

## 2019-09-12 DIAGNOSIS — M1712 Unilateral primary osteoarthritis, left knee: Secondary | ICD-10-CM | POA: Diagnosis not present

## 2019-09-12 DIAGNOSIS — R011 Cardiac murmur, unspecified: Secondary | ICD-10-CM | POA: Diagnosis not present

## 2019-09-12 DIAGNOSIS — M47816 Spondylosis without myelopathy or radiculopathy, lumbar region: Secondary | ICD-10-CM | POA: Diagnosis not present

## 2019-09-13 ENCOUNTER — Encounter: Payer: Self-pay | Admitting: Physical Therapy

## 2019-09-13 ENCOUNTER — Ambulatory Visit: Payer: PPO | Admitting: Physical Therapy

## 2019-09-13 ENCOUNTER — Other Ambulatory Visit: Payer: Self-pay

## 2019-09-13 DIAGNOSIS — R2681 Unsteadiness on feet: Secondary | ICD-10-CM

## 2019-09-13 DIAGNOSIS — M25672 Stiffness of left ankle, not elsewhere classified: Secondary | ICD-10-CM

## 2019-09-13 NOTE — Therapy (Signed)
[place] MAIN [facility] SERVICES [address] [place], [place], [address] Phone: [phone]   Fax:  [phone]  Physical Therapy Treatment/Discharge Summary  Patient Details  Name: Teresa Chambers MRN: [id number] Date of Birth: [birth year] No data recorded  Encounter Date: 09/13/2019  PT End of Session - 09/13/19 1307    Visit Number  20    Number of Visits  20    Date for PT Re-Evaluation  09/15/19    Authorization Type  [employer] medicare    Authorization Time Period  06/20/19-08/19/18    PT Start Time  1302    PT Stop Time  1345    PT Time Calculation (min)  43 min    Equipment Utilized During Treatment  Gait belt    Activity Tolerance  Patient tolerated treatment well;No increased pain    Behavior During Therapy  WFL for tasks assessed/performed       Past Medical History:  Diagnosis Date  . Acute thoracic back pain   . Asthma   . Chest pain    unspecified  . CHF (congestive heart failure) ([place]) 04/30/2017  . Chronic abdominal pain 05/01/2017   unspecified  . Congenital heart failure ([place])   . COPD (chronic obstructive pulmonary disease) ([place])   . Disease of upper respiratory system 04/30/2017  . Dyspnea on exertion   . Esophageal reflux disease 04/30/2017  . History of bone density study 06/28/2004  . Leg swelling   . Lumbar arthropathy   . Nasopharyngitis 04/30/2017  . Osteoarthropathy 04/30/2017  . Osteoporosis 04/30/2017  . Palpitations   . Sinusitis, acute 04/30/2017   unspecified    Past Surgical History:  Procedure Laterality Date  . CATARACT EXTRACTION     07/21/2001 - 07/20/2002  . CHOLECYSTECTOMY     07/21/1997 - 07/20/1998  . COLON SURGERY    . COLONOSCOPY     05/05/2000, 01/17/2000 Adenomatous Polyps   . COLONOSCOPY     11/05/2009, 12/23/2004, 07/07/2001   PH Adenomatous Polyps: CBF 10/2014; recall ltr mailed 09/18/2014 (dw)  . ESOPHAGOGASTRODUODENOSCOPY     11/05/2009, 05/05/2000  . FLEXIBLE SIGMOIDOSCOPY   11/28/1999  . HAND SURGERY  2006  . HERNIA REPAIR    . HIP ARTHROPLASTY Right 02/11/2018   Procedure: ARTHROPLASTY BIPOLAR HIP (HEMIARTHROPLASTY);  Surgeon: Corky Mull, MD;  Location: [facility] ORS;  Service: Orthopedics;  Laterality: Right;  . LAPAROSCOPIC COLON RESECTION     2001    There were no vitals filed for this visit.  Subjective Assessment - 09/13/19 1305    Subjective  Patient reports doing well; denies any pain; She reports getting a steroid injection in left knee yesterday with some relief.    Pertinent History  Teresa Chambers is an 54yoF who comes to [name] OPPT for. PMH: falls s/p Rt hip fracture and Rt HHA July 2019, COPD, asthma, CHF, hernia repair. chronic venous insufficiency, Right shoulder fracture June 2020 managed conservatively and PT 20x.Pt is referred to OPPT for ongoing balance issues and several falls over the past 2 years. Pt reports some difficulty managing her feet when walking.    Currently in Pain?  No/denies    Pain Onset  More than a month ago    Multiple Pain Sites  No         [facility] PT Assessment - 09/13/19 0001      6 Minute walk- Post Test   HR (bpm)  125    02 Sat (%RA)  95 %  6 minute walk test results    Aerobic Endurance Distance Walked  1005    Endurance additional comments  without AD, good foot clearance        TREATMENT: Warm up oncrosstrainer, level 2 BUE/BLE x85mn (unbilled);  Patient instructed in advanced balance exercise  Standing ongreen dynadisc: -SLS unsupported 15 sec hold x2 reps each LE  SLS on firm surface: R: 20 sec, L: 15 sec with good stance control and balance;   Exercise: Standing on bottom step: Heel off step calf stretch 30 sec hold x2 reps bilaterally;  Standing on firm surface: Single leg heel raises x15 reps each LE;   Instructed patient inseated hip ER ROM:  Seated figure 4 stretch 20 sec hold x1 reps bilaterally; Patient required min VCs for proper positioning and exercise  technique. Pt able to exhibit improved flexibility this session but still has difficulty especially on right side;   Response to treatment: Tolerated well. She denies any pain at end of session. Pt educated in walking program as part of HEP including utilizing treadmill if desired. Pt verbalized understanding. She has met all goals at this time and is agreeable to discharge from PT.                PT Education - 09/13/19 1307    Education Details  LE strength, balance, HEP    Person(s) Educated  Patient    Methods  Explanation;Verbal cues    Comprehension  Verbalized understanding;Returned demonstration;Verbal cues required;Need further instruction       PT Short Term Goals - 09/13/19 1307      PT SHORT TERM GOAL #1   Title  After 4 weeks pt  will improve 6 min walk test to >1000 feet for improved community ambulator.    Baseline  2MWT at evaluation 0.749m (unable to go farther d/t back pain/fatigue); on 07/25/19 2MWT [id number] 0.46m31m1/28: 6 min walk: 930 feet, 2/23: 1005 feet    Time  4    Period  Weeks    Status  Achieved    Target Date  09/15/19      PT SHORT TERM GOAL #2   Title  After 4 weeks pt to demonstrate SLS>10sec bilat.    Baseline  At eval ~5-7sec bilat; unchanged at visit 10 (07/25/19), 1/28: 10-12 sec consistently;    Time  4    Period  Weeks    Status  Achieved    Target Date  07/20/19        PT Long Term Goals - 09/13/19 1307      PT LONG TERM GOAL #1   Title  Pt to demonstrate improved SLS heel raise to >15 bilat without loss of height.    Baseline  Loss of height after 7x bilat; 1/28: able to complete 15 repetitions with good ROM and control;    Time  8    Period  Weeks    Status  Achieved    Target Date  09/15/19      PT LONG TERM GOAL #2   Title  Pt to demonstrate 5xSTS <11sec to demonstrate improved funcitonal ankle/hip strength and coordiantion.    Baseline  ~15sec at eval; unchanged at visit 10, 1/28: pt able to transfer without  pushing on chair, but does have pain/difficulty due to OA, unlikely to change, goal discontinued.    Time  8    Period  Weeks    Status  Deferred    Target Date  09/15/19  PT LONG TERM GOAL #3   Title  Pt to demonstrate improved SLS >15 sec bilaterally to improve safety with ADLs.    Baseline  ~5-7sec at eval; unchanged at visit 10; 1/28: SLS on firm surface: 10-12 sec consistently; 2/23: R: 20 sec, L: 15 sec    Time  4    Period  Weeks    Status  Achieved    Target Date  09/15/19      PT LONG TERM GOAL #4   Title  Pt to demonstrate 4+/5 hip ABDCT strength bilat.    Baseline  4-/5 at eval, 1/28: 4+/5 bilaterally;    Time  8    Period  Weeks    Status  Achieved    Target Date  09/15/19            Plan - 09/13/19 1413    Clinical Impression Statement  Patient motivated and participated well within session. She exhibits significant improvement in gait ability and balance this session. She was able to walk >1000 feet on 6 min walk test. She denies any fatigue in LE but does report increased shortness of breath as limiting factor with walking endurance on 6 min walk test. Educated patient in ways to initiate a home walking program to improve cardiovascular endurance. Pt verbalized understanding. She exhibits improved balance being able to hold SLS for 15+ sec each LE. She has met all goals and is appropriate for discharge at this time. Reinforced HEP. Patient would benefit from additional skilled PT intervention to improve strength, balance and gait safety;    Personal Factors and Comorbidities  Age;Behavior Pattern;Past/Current Experience;Fitness    Armed forces logistics/support/administrative officer;Reach Overhead;Bend;Locomotion Level;Lift    Examination-Participation Restrictions  Shop;Cleaning;Community Activity;Yard Work    Stability/Clinical Decision Making  Stable/Uncomplicated    Rehab Potential  Good    PT Frequency  1x / week    PT Duration  4 weeks    PT  Treatment/Interventions  ADLs/Self Care Home Management;Electrical Stimulation;Moist Heat;Stair training;Gait training;DME Instruction;Functional mobility training;Therapeutic activities;Therapeutic exercise;Balance training;Neuromuscular re-education;Patient/family education;Manual techniques;Passive range of motion;Dry needling;Vestibular;Taping    PT Next Visit Plan  Review goals, HEP; work on ankle strength, hip strength, and dynamic balance training    PT Home Exercise Plan  3x60sec heel strike AMB; 1x15 heel raises, 1x15 wall leaning dorsiflexion    Consulted and Agree with Plan of Care  Patient       Patient will benefit from skilled therapeutic intervention in order to improve the following deficits and impairments:  Abnormal gait, Decreased balance, Decreased endurance, Difficulty walking, Hypomobility, Increased muscle spasms, Decreased knowledge of precautions, Decreased range of motion, Decreased activity tolerance, Decreased coordination, Decreased knowledge of use of DME, Decreased strength, Postural dysfunction  Visit Diagnosis: Stiffness of left ankle, not elsewhere classified  Unsteadiness on feet     Problem List Patient Active Problem List   Diagnosis Date Noted  . Closed fracture of proximal end of right humerus 01/10/2019  . Chronic venous insufficiency 09/06/2018  . Lymphedema 09/06/2018  . GERD without esophagitis 03/10/2018  . Bilateral dry eyes 03/10/2018  . COPD (chronic obstructive pulmonary disease) ([place]) 03/04/2018  . Acute exacerbation of COPD with asthma ([place]) 02/22/2018  . Chronic anemia 02/22/2018  . Chronic constipation 02/22/2018  . Chronic bilateral thoracic back pain 02/22/2018  . Closed displaced fracture of right femoral neck ([place]) 02/22/2018  . Congenital heart failure ([place])   . Status post hip hemiarthroplasty 02/12/2018  . Hip fracture ([place]) 02/11/2018  .  Asthma 04/30/2017  . Chronic reflux esophagitis 04/30/2017  . Hyperlipemia 04/30/2017   . Osteoporosis 04/30/2017  . Cardiac murmur, previously undiagnosed 04/30/2017  . Diverticulosis of small intestine without hemorrhage 04/30/2017  . Dyspnea on exertion 04/30/2017  . Hx of adenomatous colonic polyps 04/30/2017  . Hx of cardiomegaly 04/30/2017  . Lumbar arthropathy 04/30/2017  . Nasopharyngitis 04/30/2017  . Nonspecific chest pain 04/30/2017  . Osteoarthropathy 04/30/2017  . Leg swelling 04/30/2017  . Sinusitis, acute 04/30/2017  . History of bone density study 06/28/2004    Kendyll Huettner PT, DPT 09/13/2019, 2:18 PM  [place] MAIN [facility] SERVICES [address] [place], [place], [address] Phone: [phone]   Fax:  [phone]  Name: Teresa Chambers MRN: [id number] Date of Birth: 07/15/1936

## 2019-09-16 ENCOUNTER — Ambulatory Visit: Payer: PPO

## 2019-09-20 ENCOUNTER — Ambulatory Visit: Payer: PPO | Admitting: Podiatry

## 2019-09-20 ENCOUNTER — Other Ambulatory Visit: Payer: Self-pay

## 2019-09-20 DIAGNOSIS — M79676 Pain in unspecified toe(s): Secondary | ICD-10-CM

## 2019-09-20 DIAGNOSIS — E0843 Diabetes mellitus due to underlying condition with diabetic autonomic (poly)neuropathy: Secondary | ICD-10-CM

## 2019-09-20 DIAGNOSIS — B351 Tinea unguium: Secondary | ICD-10-CM | POA: Diagnosis not present

## 2019-09-20 NOTE — Patient Instructions (Signed)

## 2019-09-23 NOTE — Progress Notes (Signed)
   SUBJECTIVE Patient with a history of diabetes mellitus presents to office today complaining of elongated, thickened nails that cause pain while ambulating in shoes. She is unable to trim her own nails. Patient is here for further evaluation and treatment.   Past Medical History:  Diagnosis Date  . Acute thoracic back pain   . Asthma   . Chest pain    unspecified  . CHF (congestive heart failure) ([place]) 04/30/2017  . Chronic abdominal pain 05/01/2017   unspecified  . Congenital heart failure ([place])   . COPD (chronic obstructive pulmonary disease) ([place])   . Disease of upper respiratory system 04/30/2017  . Dyspnea on exertion   . Esophageal reflux disease 04/30/2017  . History of bone density study 06/28/2004  . Leg swelling   . Lumbar arthropathy   . Nasopharyngitis 04/30/2017  . Osteoarthropathy 04/30/2017  . Osteoporosis 04/30/2017  . Palpitations   . Sinusitis, acute 04/30/2017   unspecified    OBJECTIVE General Patient is awake, alert, and oriented x 3 and in no acute distress. Derm Skin is dry and supple bilateral. Negative open lesions or macerations. Remaining integument unremarkable. Nails are tender, long, thickened and dystrophic with subungual debris, consistent with onychomycosis, 1-5 bilateral. No signs of infection noted. Vasc  DP and PT pedal pulses palpable bilaterally. Temperature gradient within normal limits.  Neuro Epicritic and protective threshold sensation diminished bilaterally.  Musculoskeletal Exam No symptomatic pedal deformities noted bilateral. Muscular strength within normal limits.  ASSESSMENT 1. Diabetes Mellitus w/ peripheral neuropathy 2. Onychomycosis of nail due to dermatophyte bilateral 3. Pain in foot bilateral  PLAN OF CARE 1. Patient evaluated today. 2. Instructed to maintain good pedal hygiene and foot care. Stressed importance of controlling blood sugar.  3. Mechanical debridement of nails 1-5 bilaterally performed using a nail  nipper. Filed with dremel without incident.  4. Return to clinic as needed.     Edrick Kins, DPM [facility]  Dr. Edrick Kins, [address]                                        [place], [place] [address]                Office [phone]  Fax [phone]

## 2019-10-22 DIAGNOSIS — I5022 Chronic systolic (congestive) heart failure: Secondary | ICD-10-CM | POA: Insufficient documentation

## 2019-10-22 DIAGNOSIS — R52 Pain, unspecified: Secondary | ICD-10-CM | POA: Diagnosis not present

## 2019-10-22 DIAGNOSIS — R2681 Unsteadiness on feet: Secondary | ICD-10-CM | POA: Diagnosis not present

## 2019-10-22 DIAGNOSIS — R531 Weakness: Secondary | ICD-10-CM | POA: Diagnosis not present

## 2019-10-22 DIAGNOSIS — W19XXXA Unspecified fall, initial encounter: Secondary | ICD-10-CM | POA: Diagnosis not present

## 2019-10-22 DIAGNOSIS — G8911 Acute pain due to trauma: Secondary | ICD-10-CM | POA: Diagnosis not present

## 2019-10-22 DIAGNOSIS — R278 Other lack of coordination: Secondary | ICD-10-CM | POA: Diagnosis not present

## 2019-10-22 DIAGNOSIS — I872 Venous insufficiency (chronic) (peripheral): Secondary | ICD-10-CM | POA: Diagnosis not present

## 2019-10-22 DIAGNOSIS — B961 Klebsiella pneumoniae [K. pneumoniae] as the cause of diseases classified elsewhere: Secondary | ICD-10-CM | POA: Diagnosis not present

## 2019-10-22 DIAGNOSIS — Z9181 History of falling: Secondary | ICD-10-CM | POA: Diagnosis not present

## 2019-10-22 DIAGNOSIS — I890 Lymphedema, not elsewhere classified: Secondary | ICD-10-CM | POA: Diagnosis not present

## 2019-10-22 DIAGNOSIS — S32592A Other specified fracture of left pubis, initial encounter for closed fracture: Secondary | ICD-10-CM | POA: Diagnosis not present

## 2019-10-22 DIAGNOSIS — Z7951 Long term (current) use of inhaled steroids: Secondary | ICD-10-CM | POA: Diagnosis not present

## 2019-10-22 DIAGNOSIS — S32810A Multiple fractures of pelvis with stable disruption of pelvic ring, initial encounter for closed fracture: Secondary | ICD-10-CM | POA: Diagnosis not present

## 2019-10-22 DIAGNOSIS — Z96641 Presence of right artificial hip joint: Secondary | ICD-10-CM | POA: Diagnosis not present

## 2019-10-22 DIAGNOSIS — I110 Hypertensive heart disease with heart failure: Secondary | ICD-10-CM | POA: Diagnosis not present

## 2019-10-22 DIAGNOSIS — D72829 Elevated white blood cell count, unspecified: Secondary | ICD-10-CM | POA: Diagnosis not present

## 2019-10-22 DIAGNOSIS — Z884 Allergy status to anesthetic agent status: Secondary | ICD-10-CM | POA: Diagnosis not present

## 2019-10-22 DIAGNOSIS — Z885 Allergy status to narcotic agent status: Secondary | ICD-10-CM | POA: Diagnosis not present

## 2019-10-22 DIAGNOSIS — Z79899 Other long term (current) drug therapy: Secondary | ICD-10-CM | POA: Diagnosis not present

## 2019-10-22 DIAGNOSIS — S32502A Unspecified fracture of left pubis, initial encounter for closed fracture: Secondary | ICD-10-CM | POA: Diagnosis not present

## 2019-10-22 DIAGNOSIS — N390 Urinary tract infection, site not specified: Secondary | ICD-10-CM | POA: Diagnosis not present

## 2019-10-23 DIAGNOSIS — R918 Other nonspecific abnormal finding of lung field: Secondary | ICD-10-CM | POA: Diagnosis not present

## 2019-10-23 DIAGNOSIS — R531 Weakness: Secondary | ICD-10-CM | POA: Diagnosis not present

## 2019-10-23 DIAGNOSIS — M25552 Pain in left hip: Secondary | ICD-10-CM | POA: Diagnosis not present

## 2019-10-23 DIAGNOSIS — D72829 Elevated white blood cell count, unspecified: Secondary | ICD-10-CM | POA: Insufficient documentation

## 2019-10-23 DIAGNOSIS — S32592A Other specified fracture of left pubis, initial encounter for closed fracture: Secondary | ICD-10-CM | POA: Diagnosis not present

## 2019-10-23 DIAGNOSIS — S32512A Fracture of superior rim of left pubis, initial encounter for closed fracture: Secondary | ICD-10-CM | POA: Diagnosis not present

## 2019-10-24 DIAGNOSIS — I110 Hypertensive heart disease with heart failure: Secondary | ICD-10-CM | POA: Diagnosis not present

## 2019-10-24 DIAGNOSIS — I5022 Chronic systolic (congestive) heart failure: Secondary | ICD-10-CM | POA: Diagnosis not present

## 2019-10-24 DIAGNOSIS — R278 Other lack of coordination: Secondary | ICD-10-CM | POA: Diagnosis not present

## 2019-10-24 DIAGNOSIS — D72829 Elevated white blood cell count, unspecified: Secondary | ICD-10-CM | POA: Diagnosis not present

## 2019-10-24 DIAGNOSIS — I872 Venous insufficiency (chronic) (peripheral): Secondary | ICD-10-CM | POA: Diagnosis not present

## 2019-10-24 DIAGNOSIS — B961 Klebsiella pneumoniae [K. pneumoniae] as the cause of diseases classified elsewhere: Secondary | ICD-10-CM | POA: Diagnosis not present

## 2019-10-24 DIAGNOSIS — R531 Weakness: Secondary | ICD-10-CM | POA: Diagnosis not present

## 2019-10-24 DIAGNOSIS — I890 Lymphedema, not elsewhere classified: Secondary | ICD-10-CM | POA: Diagnosis not present

## 2019-10-24 DIAGNOSIS — R2681 Unsteadiness on feet: Secondary | ICD-10-CM | POA: Diagnosis not present

## 2019-10-24 DIAGNOSIS — S32592A Other specified fracture of left pubis, initial encounter for closed fracture: Secondary | ICD-10-CM | POA: Diagnosis not present

## 2019-10-24 DIAGNOSIS — N390 Urinary tract infection, site not specified: Secondary | ICD-10-CM | POA: Diagnosis not present

## 2019-10-24 DIAGNOSIS — Z9181 History of falling: Secondary | ICD-10-CM | POA: Diagnosis not present

## 2019-10-25 DIAGNOSIS — S32592A Other specified fracture of left pubis, initial encounter for closed fracture: Secondary | ICD-10-CM | POA: Diagnosis not present

## 2019-10-26 DIAGNOSIS — S32592A Other specified fracture of left pubis, initial encounter for closed fracture: Secondary | ICD-10-CM | POA: Diagnosis not present

## 2019-10-27 DIAGNOSIS — S32592A Other specified fracture of left pubis, initial encounter for closed fracture: Secondary | ICD-10-CM | POA: Diagnosis not present

## 2019-10-28 DIAGNOSIS — F064 Anxiety disorder due to known physiological condition: Secondary | ICD-10-CM | POA: Diagnosis not present

## 2019-10-28 DIAGNOSIS — S32592S Other specified fracture of left pubis, sequela: Secondary | ICD-10-CM | POA: Diagnosis not present

## 2019-10-28 DIAGNOSIS — R918 Other nonspecific abnormal finding of lung field: Secondary | ICD-10-CM | POA: Diagnosis not present

## 2019-10-28 DIAGNOSIS — T474X5A Adverse effect of other laxatives, initial encounter: Secondary | ICD-10-CM | POA: Diagnosis not present

## 2019-10-28 DIAGNOSIS — S32502D Unspecified fracture of left pubis, subsequent encounter for fracture with routine healing: Secondary | ICD-10-CM | POA: Diagnosis not present

## 2019-10-28 DIAGNOSIS — J440 Chronic obstructive pulmonary disease with acute lower respiratory infection: Secondary | ICD-10-CM | POA: Diagnosis not present

## 2019-10-28 DIAGNOSIS — R2681 Unsteadiness on feet: Secondary | ICD-10-CM | POA: Diagnosis not present

## 2019-10-28 DIAGNOSIS — I872 Venous insufficiency (chronic) (peripheral): Secondary | ICD-10-CM | POA: Diagnosis not present

## 2019-10-28 DIAGNOSIS — Z6825 Body mass index (BMI) 25.0-25.9, adult: Secondary | ICD-10-CM | POA: Diagnosis not present

## 2019-10-28 DIAGNOSIS — Z5181 Encounter for therapeutic drug level monitoring: Secondary | ICD-10-CM | POA: Diagnosis not present

## 2019-10-28 DIAGNOSIS — J449 Chronic obstructive pulmonary disease, unspecified: Secondary | ICD-10-CM | POA: Diagnosis not present

## 2019-10-28 DIAGNOSIS — R110 Nausea: Secondary | ICD-10-CM | POA: Diagnosis not present

## 2019-10-28 DIAGNOSIS — I499 Cardiac arrhythmia, unspecified: Secondary | ICD-10-CM | POA: Diagnosis not present

## 2019-10-28 DIAGNOSIS — R1111 Vomiting without nausea: Secondary | ICD-10-CM | POA: Diagnosis not present

## 2019-10-28 DIAGNOSIS — S32592A Other specified fracture of left pubis, initial encounter for closed fracture: Secondary | ICD-10-CM | POA: Diagnosis not present

## 2019-10-28 DIAGNOSIS — B961 Klebsiella pneumoniae [K. pneumoniae] as the cause of diseases classified elsewhere: Secondary | ICD-10-CM | POA: Diagnosis not present

## 2019-10-28 DIAGNOSIS — K219 Gastro-esophageal reflux disease without esophagitis: Secondary | ICD-10-CM | POA: Diagnosis not present

## 2019-10-28 DIAGNOSIS — Z884 Allergy status to anesthetic agent status: Secondary | ICD-10-CM | POA: Diagnosis not present

## 2019-10-28 DIAGNOSIS — J45909 Unspecified asthma, uncomplicated: Secondary | ICD-10-CM | POA: Diagnosis not present

## 2019-10-28 DIAGNOSIS — R0902 Hypoxemia: Secondary | ICD-10-CM | POA: Diagnosis not present

## 2019-10-28 DIAGNOSIS — K521 Toxic gastroenteritis and colitis: Secondary | ICD-10-CM | POA: Diagnosis not present

## 2019-10-28 DIAGNOSIS — J9601 Acute respiratory failure with hypoxia: Secondary | ICD-10-CM | POA: Diagnosis not present

## 2019-10-28 DIAGNOSIS — R627 Adult failure to thrive: Secondary | ICD-10-CM | POA: Diagnosis not present

## 2019-10-28 DIAGNOSIS — J690 Pneumonitis due to inhalation of food and vomit: Secondary | ICD-10-CM | POA: Diagnosis not present

## 2019-10-28 DIAGNOSIS — G92 Toxic encephalopathy: Secondary | ICD-10-CM | POA: Diagnosis not present

## 2019-10-28 DIAGNOSIS — B9689 Other specified bacterial agents as the cause of diseases classified elsewhere: Secondary | ICD-10-CM | POA: Diagnosis not present

## 2019-10-28 DIAGNOSIS — D649 Anemia, unspecified: Secondary | ICD-10-CM | POA: Diagnosis not present

## 2019-10-28 DIAGNOSIS — D72829 Elevated white blood cell count, unspecified: Secondary | ICD-10-CM | POA: Diagnosis not present

## 2019-10-28 DIAGNOSIS — Z9181 History of falling: Secondary | ICD-10-CM | POA: Diagnosis not present

## 2019-10-28 DIAGNOSIS — M25552 Pain in left hip: Secondary | ICD-10-CM | POA: Diagnosis not present

## 2019-10-28 DIAGNOSIS — K560 Paralytic ileus: Secondary | ICD-10-CM | POA: Diagnosis not present

## 2019-10-28 DIAGNOSIS — J984 Other disorders of lung: Secondary | ICD-10-CM | POA: Diagnosis not present

## 2019-10-28 DIAGNOSIS — J189 Pneumonia, unspecified organism: Secondary | ICD-10-CM | POA: Diagnosis not present

## 2019-10-28 DIAGNOSIS — B3749 Other urogenital candidiasis: Secondary | ICD-10-CM | POA: Diagnosis not present

## 2019-10-28 DIAGNOSIS — J80 Acute respiratory distress syndrome: Secondary | ICD-10-CM | POA: Diagnosis not present

## 2019-10-28 DIAGNOSIS — K567 Ileus, unspecified: Secondary | ICD-10-CM | POA: Diagnosis not present

## 2019-10-28 DIAGNOSIS — N3000 Acute cystitis without hematuria: Secondary | ICD-10-CM | POA: Diagnosis not present

## 2019-10-28 DIAGNOSIS — I340 Nonrheumatic mitral (valve) insufficiency: Secondary | ICD-10-CM | POA: Diagnosis not present

## 2019-10-28 DIAGNOSIS — R0602 Shortness of breath: Secondary | ICD-10-CM | POA: Diagnosis not present

## 2019-10-28 DIAGNOSIS — I5022 Chronic systolic (congestive) heart failure: Secondary | ICD-10-CM | POA: Diagnosis not present

## 2019-10-28 DIAGNOSIS — N390 Urinary tract infection, site not specified: Secondary | ICD-10-CM | POA: Diagnosis not present

## 2019-10-28 DIAGNOSIS — A419 Sepsis, unspecified organism: Secondary | ICD-10-CM | POA: Diagnosis not present

## 2019-10-28 DIAGNOSIS — R531 Weakness: Secondary | ICD-10-CM | POA: Diagnosis not present

## 2019-10-28 DIAGNOSIS — B371 Pulmonary candidiasis: Secondary | ICD-10-CM | POA: Diagnosis not present

## 2019-10-28 DIAGNOSIS — I890 Lymphedema, not elsewhere classified: Secondary | ICD-10-CM | POA: Diagnosis not present

## 2019-10-28 DIAGNOSIS — J168 Pneumonia due to other specified infectious organisms: Secondary | ICD-10-CM | POA: Diagnosis not present

## 2019-10-28 DIAGNOSIS — R262 Difficulty in walking, not elsewhere classified: Secondary | ICD-10-CM | POA: Diagnosis not present

## 2019-10-28 DIAGNOSIS — M6281 Muscle weakness (generalized): Secondary | ICD-10-CM | POA: Diagnosis not present

## 2019-10-28 DIAGNOSIS — I5032 Chronic diastolic (congestive) heart failure: Secondary | ICD-10-CM | POA: Diagnosis not present

## 2019-10-28 DIAGNOSIS — M546 Pain in thoracic spine: Secondary | ICD-10-CM | POA: Diagnosis not present

## 2019-10-28 DIAGNOSIS — D638 Anemia in other chronic diseases classified elsewhere: Secondary | ICD-10-CM | POA: Diagnosis not present

## 2019-10-28 DIAGNOSIS — R1310 Dysphagia, unspecified: Secondary | ICD-10-CM | POA: Diagnosis not present

## 2019-10-28 DIAGNOSIS — Z66 Do not resuscitate: Secondary | ICD-10-CM | POA: Diagnosis not present

## 2019-10-28 DIAGNOSIS — D509 Iron deficiency anemia, unspecified: Secondary | ICD-10-CM | POA: Diagnosis not present

## 2019-10-28 DIAGNOSIS — Z20822 Contact with and (suspected) exposure to covid-19: Secondary | ICD-10-CM | POA: Diagnosis not present

## 2019-10-28 DIAGNOSIS — R52 Pain, unspecified: Secondary | ICD-10-CM | POA: Diagnosis not present

## 2019-10-28 DIAGNOSIS — M129 Arthropathy, unspecified: Secondary | ICD-10-CM | POA: Diagnosis not present

## 2019-10-28 DIAGNOSIS — R112 Nausea with vomiting, unspecified: Secondary | ICD-10-CM | POA: Diagnosis not present

## 2019-10-28 DIAGNOSIS — S3289XA Fracture of other parts of pelvis, initial encounter for closed fracture: Secondary | ICD-10-CM | POA: Diagnosis not present

## 2019-10-28 DIAGNOSIS — F329 Major depressive disorder, single episode, unspecified: Secondary | ICD-10-CM | POA: Diagnosis not present

## 2019-10-31 DIAGNOSIS — D638 Anemia in other chronic diseases classified elsewhere: Secondary | ICD-10-CM | POA: Diagnosis not present

## 2019-10-31 DIAGNOSIS — S32592S Other specified fracture of left pubis, sequela: Secondary | ICD-10-CM | POA: Diagnosis not present

## 2019-10-31 DIAGNOSIS — I5022 Chronic systolic (congestive) heart failure: Secondary | ICD-10-CM | POA: Diagnosis not present

## 2019-10-31 DIAGNOSIS — B9689 Other specified bacterial agents as the cause of diseases classified elsewhere: Secondary | ICD-10-CM | POA: Diagnosis not present

## 2019-10-31 DIAGNOSIS — I872 Venous insufficiency (chronic) (peripheral): Secondary | ICD-10-CM | POA: Diagnosis not present

## 2019-10-31 DIAGNOSIS — J984 Other disorders of lung: Secondary | ICD-10-CM | POA: Diagnosis not present

## 2019-10-31 DIAGNOSIS — N390 Urinary tract infection, site not specified: Secondary | ICD-10-CM | POA: Diagnosis not present

## 2019-11-03 ENCOUNTER — Ambulatory Visit: Payer: PPO | Admitting: Dermatology

## 2019-11-03 DIAGNOSIS — S32592S Other specified fracture of left pubis, sequela: Secondary | ICD-10-CM | POA: Diagnosis not present

## 2019-11-03 DIAGNOSIS — J984 Other disorders of lung: Secondary | ICD-10-CM | POA: Diagnosis not present

## 2019-11-03 DIAGNOSIS — D638 Anemia in other chronic diseases classified elsewhere: Secondary | ICD-10-CM | POA: Diagnosis not present

## 2019-11-03 DIAGNOSIS — I5022 Chronic systolic (congestive) heart failure: Secondary | ICD-10-CM | POA: Diagnosis not present

## 2019-11-07 DIAGNOSIS — S32592S Other specified fracture of left pubis, sequela: Secondary | ICD-10-CM | POA: Diagnosis not present

## 2019-11-07 DIAGNOSIS — K560 Paralytic ileus: Secondary | ICD-10-CM | POA: Diagnosis not present

## 2019-11-07 DIAGNOSIS — F064 Anxiety disorder due to known physiological condition: Secondary | ICD-10-CM | POA: Diagnosis not present

## 2019-11-07 DIAGNOSIS — J984 Other disorders of lung: Secondary | ICD-10-CM | POA: Diagnosis not present

## 2019-11-10 DIAGNOSIS — I340 Nonrheumatic mitral (valve) insufficiency: Secondary | ICD-10-CM | POA: Diagnosis not present

## 2019-11-10 DIAGNOSIS — B371 Pulmonary candidiasis: Secondary | ICD-10-CM | POA: Diagnosis not present

## 2019-11-10 DIAGNOSIS — J440 Chronic obstructive pulmonary disease with acute lower respiratory infection: Secondary | ICD-10-CM | POA: Diagnosis not present

## 2019-11-10 DIAGNOSIS — J189 Pneumonia, unspecified organism: Secondary | ICD-10-CM | POA: Diagnosis not present

## 2019-11-10 DIAGNOSIS — R0602 Shortness of breath: Secondary | ICD-10-CM | POA: Diagnosis not present

## 2019-11-10 DIAGNOSIS — R52 Pain, unspecified: Secondary | ICD-10-CM | POA: Diagnosis not present

## 2019-11-10 DIAGNOSIS — Z884 Allergy status to anesthetic agent status: Secondary | ICD-10-CM | POA: Diagnosis not present

## 2019-11-10 DIAGNOSIS — Z20822 Contact with and (suspected) exposure to covid-19: Secondary | ICD-10-CM | POA: Diagnosis not present

## 2019-11-10 DIAGNOSIS — R262 Difficulty in walking, not elsewhere classified: Secondary | ICD-10-CM | POA: Diagnosis not present

## 2019-11-10 DIAGNOSIS — J181 Lobar pneumonia, unspecified organism: Secondary | ICD-10-CM | POA: Diagnosis not present

## 2019-11-10 DIAGNOSIS — R109 Unspecified abdominal pain: Secondary | ICD-10-CM | POA: Diagnosis not present

## 2019-11-10 DIAGNOSIS — K922 Gastrointestinal hemorrhage, unspecified: Secondary | ICD-10-CM | POA: Diagnosis not present

## 2019-11-10 DIAGNOSIS — Z9981 Dependence on supplemental oxygen: Secondary | ICD-10-CM | POA: Diagnosis not present

## 2019-11-10 DIAGNOSIS — F331 Major depressive disorder, recurrent, moderate: Secondary | ICD-10-CM | POA: Diagnosis not present

## 2019-11-10 DIAGNOSIS — J9601 Acute respiratory failure with hypoxia: Secondary | ICD-10-CM | POA: Diagnosis not present

## 2019-11-10 DIAGNOSIS — R1312 Dysphagia, oropharyngeal phase: Secondary | ICD-10-CM | POA: Diagnosis not present

## 2019-11-10 DIAGNOSIS — Z6825 Body mass index (BMI) 25.0-25.9, adult: Secondary | ICD-10-CM | POA: Diagnosis not present

## 2019-11-10 DIAGNOSIS — D509 Iron deficiency anemia, unspecified: Secondary | ICD-10-CM | POA: Diagnosis not present

## 2019-11-10 DIAGNOSIS — K560 Paralytic ileus: Secondary | ICD-10-CM | POA: Diagnosis not present

## 2019-11-10 DIAGNOSIS — J690 Pneumonitis due to inhalation of food and vomit: Secondary | ICD-10-CM | POA: Diagnosis not present

## 2019-11-10 DIAGNOSIS — K219 Gastro-esophageal reflux disease without esophagitis: Secondary | ICD-10-CM | POA: Diagnosis not present

## 2019-11-10 DIAGNOSIS — J168 Pneumonia due to other specified infectious organisms: Secondary | ICD-10-CM | POA: Diagnosis not present

## 2019-11-10 DIAGNOSIS — I5022 Chronic systolic (congestive) heart failure: Secondary | ICD-10-CM | POA: Diagnosis not present

## 2019-11-10 DIAGNOSIS — T474X5A Adverse effect of other laxatives, initial encounter: Secondary | ICD-10-CM | POA: Diagnosis not present

## 2019-11-10 DIAGNOSIS — R0789 Other chest pain: Secondary | ICD-10-CM | POA: Diagnosis not present

## 2019-11-10 DIAGNOSIS — J984 Other disorders of lung: Secondary | ICD-10-CM | POA: Diagnosis not present

## 2019-11-10 DIAGNOSIS — I872 Venous insufficiency (chronic) (peripheral): Secondary | ICD-10-CM | POA: Diagnosis not present

## 2019-11-10 DIAGNOSIS — R0902 Hypoxemia: Secondary | ICD-10-CM | POA: Diagnosis not present

## 2019-11-10 DIAGNOSIS — S32592S Other specified fracture of left pubis, sequela: Secondary | ICD-10-CM | POA: Diagnosis not present

## 2019-11-10 DIAGNOSIS — F064 Anxiety disorder due to known physiological condition: Secondary | ICD-10-CM | POA: Diagnosis not present

## 2019-11-10 DIAGNOSIS — R918 Other nonspecific abnormal finding of lung field: Secondary | ICD-10-CM | POA: Diagnosis not present

## 2019-11-10 DIAGNOSIS — M25552 Pain in left hip: Secondary | ICD-10-CM | POA: Diagnosis not present

## 2019-11-10 DIAGNOSIS — A419 Sepsis, unspecified organism: Secondary | ICD-10-CM | POA: Diagnosis not present

## 2019-11-10 DIAGNOSIS — M6281 Muscle weakness (generalized): Secondary | ICD-10-CM | POA: Diagnosis not present

## 2019-11-10 DIAGNOSIS — R112 Nausea with vomiting, unspecified: Secondary | ICD-10-CM | POA: Diagnosis not present

## 2019-11-10 DIAGNOSIS — Z66 Do not resuscitate: Secondary | ICD-10-CM | POA: Diagnosis not present

## 2019-11-10 DIAGNOSIS — B3749 Other urogenital candidiasis: Secondary | ICD-10-CM | POA: Diagnosis not present

## 2019-11-10 DIAGNOSIS — I5083 High output heart failure: Secondary | ICD-10-CM | POA: Diagnosis not present

## 2019-11-10 DIAGNOSIS — S32592D Other specified fracture of left pubis, subsequent encounter for fracture with routine healing: Secondary | ICD-10-CM | POA: Diagnosis not present

## 2019-11-10 DIAGNOSIS — I5032 Chronic diastolic (congestive) heart failure: Secondary | ICD-10-CM | POA: Diagnosis not present

## 2019-11-10 DIAGNOSIS — S32592A Other specified fracture of left pubis, initial encounter for closed fracture: Secondary | ICD-10-CM | POA: Diagnosis not present

## 2019-11-10 DIAGNOSIS — D649 Anemia, unspecified: Secondary | ICD-10-CM | POA: Diagnosis not present

## 2019-11-10 DIAGNOSIS — K521 Toxic gastroenteritis and colitis: Secondary | ICD-10-CM | POA: Diagnosis not present

## 2019-11-10 DIAGNOSIS — I10 Essential (primary) hypertension: Secondary | ICD-10-CM | POA: Diagnosis not present

## 2019-11-10 DIAGNOSIS — J80 Acute respiratory distress syndrome: Secondary | ICD-10-CM | POA: Diagnosis not present

## 2019-11-10 DIAGNOSIS — I499 Cardiac arrhythmia, unspecified: Secondary | ICD-10-CM | POA: Diagnosis not present

## 2019-11-10 DIAGNOSIS — G92 Toxic encephalopathy: Secondary | ICD-10-CM | POA: Diagnosis not present

## 2019-11-10 DIAGNOSIS — S32512A Fracture of superior rim of left pubis, initial encounter for closed fracture: Secondary | ICD-10-CM | POA: Diagnosis not present

## 2019-11-10 DIAGNOSIS — R41841 Cognitive communication deficit: Secondary | ICD-10-CM | POA: Diagnosis not present

## 2019-11-10 DIAGNOSIS — J449 Chronic obstructive pulmonary disease, unspecified: Secondary | ICD-10-CM | POA: Diagnosis not present

## 2019-11-10 DIAGNOSIS — R627 Adult failure to thrive: Secondary | ICD-10-CM | POA: Diagnosis not present

## 2019-11-10 DIAGNOSIS — S32502D Unspecified fracture of left pubis, subsequent encounter for fracture with routine healing: Secondary | ICD-10-CM | POA: Diagnosis not present

## 2019-11-10 DIAGNOSIS — F329 Major depressive disorder, single episode, unspecified: Secondary | ICD-10-CM | POA: Diagnosis not present

## 2019-11-10 DIAGNOSIS — D638 Anemia in other chronic diseases classified elsewhere: Secondary | ICD-10-CM | POA: Diagnosis not present

## 2019-11-10 DIAGNOSIS — R1310 Dysphagia, unspecified: Secondary | ICD-10-CM | POA: Diagnosis not present

## 2019-11-10 DIAGNOSIS — K567 Ileus, unspecified: Secondary | ICD-10-CM | POA: Diagnosis not present

## 2019-11-10 DIAGNOSIS — I890 Lymphedema, not elsewhere classified: Secondary | ICD-10-CM | POA: Diagnosis not present

## 2019-11-10 DIAGNOSIS — S32810A Multiple fractures of pelvis with stable disruption of pelvic ring, initial encounter for closed fracture: Secondary | ICD-10-CM | POA: Diagnosis not present

## 2019-11-12 MED ORDER — GENERIC EXTERNAL MEDICATION
Status: DC
Start: ? — End: 2019-11-12

## 2019-11-12 MED ORDER — KETOROLAC TROMETHAMINE 15 MG/ML IJ SOLN
15.00 | INTRAMUSCULAR | Status: DC
Start: ? — End: 2019-11-12

## 2019-11-12 MED ORDER — IPRATROPIUM-ALBUTEROL 0.5-2.5 (3) MG/3ML IN SOLN
3.00 | RESPIRATORY_TRACT | Status: DC
Start: ? — End: 2019-11-12

## 2019-11-12 MED ORDER — SODIUM CHLORIDE 0.9 % IV SOLN
10.00 | INTRAVENOUS | Status: DC
Start: ? — End: 2019-11-12

## 2019-11-12 MED ORDER — SENNOSIDES 8.6 MG PO TABS
1.00 | ORAL_TABLET | ORAL | Status: DC
Start: 2019-11-12 — End: 2019-11-12

## 2019-11-12 MED ORDER — BUDESONIDE-FORMOTEROL FUMARATE 80-4.5 MCG/ACT IN AERO
2.00 | INHALATION_SPRAY | RESPIRATORY_TRACT | Status: DC
Start: 2019-11-12 — End: 2019-11-12

## 2019-11-12 MED ORDER — MORPHINE SULFATE ER 15 MG PO TBCR
30.00 | EXTENDED_RELEASE_TABLET | ORAL | Status: DC
Start: 2019-11-12 — End: 2019-11-12

## 2019-11-12 MED ORDER — MORPHINE SULFATE (PF) 2 MG/ML IV SOLN
2.00 | INTRAVENOUS | Status: DC
Start: ? — End: 2019-11-12

## 2019-11-12 MED ORDER — SACUBITRIL-VALSARTAN 24-26 MG PO TABS
1.00 | ORAL_TABLET | ORAL | Status: DC
Start: 2019-11-12 — End: 2019-11-12

## 2019-11-12 MED ORDER — DILTIAZEM HCL ER BEADS 120 MG PO CP24
120.00 | ORAL_CAPSULE | ORAL | Status: DC
Start: 2019-11-13 — End: 2019-11-12

## 2019-11-12 MED ORDER — ENOXAPARIN SODIUM 40 MG/0.4ML ~~LOC~~ SOLN
40.00 | SUBCUTANEOUS | Status: DC
Start: 2019-11-12 — End: 2019-11-12

## 2019-11-12 MED ORDER — PANTOPRAZOLE SODIUM 40 MG IV SOLR
40.00 | INTRAVENOUS | Status: DC
Start: 2019-11-12 — End: 2019-11-12

## 2019-11-12 MED ORDER — METHOCARBAMOL 500 MG PO TABS
750.00 | ORAL_TABLET | ORAL | Status: DC
Start: 2019-11-12 — End: 2019-11-12

## 2019-11-12 MED ORDER — GENERIC EXTERNAL MEDICATION
2.00 | Status: DC
Start: 2019-11-12 — End: 2019-11-12

## 2019-11-12 MED ORDER — ATORVASTATIN CALCIUM 20 MG PO TABS
20.00 | ORAL_TABLET | ORAL | Status: DC
Start: 2019-11-12 — End: 2019-11-12

## 2019-11-12 MED ORDER — DULOXETINE HCL 30 MG PO CPEP
60.00 | ORAL_CAPSULE | ORAL | Status: DC
Start: 2019-11-13 — End: 2019-11-12

## 2019-11-12 MED ORDER — FUROSEMIDE 10 MG/ML IJ SOLN
40.00 | INTRAMUSCULAR | Status: DC
Start: 2019-11-12 — End: 2019-11-12

## 2019-11-12 MED ORDER — MORPHINE SULFATE 15 MG PO TABS
15.00 | ORAL_TABLET | ORAL | Status: DC
Start: ? — End: 2019-11-12

## 2019-11-12 MED ORDER — PREGABALIN 25 MG PO CAPS
50.00 | ORAL_CAPSULE | ORAL | Status: DC
Start: 2019-11-12 — End: 2019-11-12

## 2019-11-12 MED ORDER — GENERIC EXTERNAL MEDICATION
750.00 | Status: DC
Start: 2019-11-12 — End: 2019-11-12

## 2019-11-25 DIAGNOSIS — I5022 Chronic systolic (congestive) heart failure: Secondary | ICD-10-CM | POA: Diagnosis not present

## 2019-11-25 DIAGNOSIS — K219 Gastro-esophageal reflux disease without esophagitis: Secondary | ICD-10-CM | POA: Diagnosis not present

## 2019-11-25 DIAGNOSIS — I340 Nonrheumatic mitral (valve) insufficiency: Secondary | ICD-10-CM | POA: Diagnosis not present

## 2019-11-25 DIAGNOSIS — D649 Anemia, unspecified: Secondary | ICD-10-CM | POA: Diagnosis not present

## 2019-11-25 DIAGNOSIS — S32592D Other specified fracture of left pubis, subsequent encounter for fracture with routine healing: Secondary | ICD-10-CM | POA: Diagnosis not present

## 2019-11-25 DIAGNOSIS — S32592 Other specified fracture of left pubis: Secondary | ICD-10-CM | POA: Diagnosis not present

## 2019-11-25 DIAGNOSIS — S32502D Unspecified fracture of left pubis, subsequent encounter for fracture with routine healing: Secondary | ICD-10-CM | POA: Diagnosis not present

## 2019-11-25 DIAGNOSIS — Z79899 Other long term (current) drug therapy: Secondary | ICD-10-CM | POA: Diagnosis not present

## 2019-11-25 DIAGNOSIS — J449 Chronic obstructive pulmonary disease, unspecified: Secondary | ICD-10-CM | POA: Diagnosis not present

## 2019-11-25 DIAGNOSIS — R41841 Cognitive communication deficit: Secondary | ICD-10-CM | POA: Diagnosis not present

## 2019-11-25 DIAGNOSIS — J9601 Acute respiratory failure with hypoxia: Secondary | ICD-10-CM | POA: Diagnosis not present

## 2019-11-25 DIAGNOSIS — J984 Other disorders of lung: Secondary | ICD-10-CM | POA: Diagnosis not present

## 2019-11-25 DIAGNOSIS — Z9981 Dependence on supplemental oxygen: Secondary | ICD-10-CM | POA: Diagnosis not present

## 2019-11-25 DIAGNOSIS — F064 Anxiety disorder due to known physiological condition: Secondary | ICD-10-CM | POA: Diagnosis not present

## 2019-11-25 DIAGNOSIS — D638 Anemia in other chronic diseases classified elsewhere: Secondary | ICD-10-CM | POA: Diagnosis not present

## 2019-11-25 DIAGNOSIS — M6281 Muscle weakness (generalized): Secondary | ICD-10-CM | POA: Diagnosis not present

## 2019-11-25 DIAGNOSIS — I10 Essential (primary) hypertension: Secondary | ICD-10-CM | POA: Diagnosis not present

## 2019-11-25 DIAGNOSIS — Z23 Encounter for immunization: Secondary | ICD-10-CM | POA: Diagnosis not present

## 2019-11-25 DIAGNOSIS — J181 Lobar pneumonia, unspecified organism: Secondary | ICD-10-CM | POA: Diagnosis not present

## 2019-11-25 DIAGNOSIS — R1312 Dysphagia, oropharyngeal phase: Secondary | ICD-10-CM | POA: Diagnosis not present

## 2019-11-25 DIAGNOSIS — S32592S Other specified fracture of left pubis, sequela: Secondary | ICD-10-CM | POA: Diagnosis not present

## 2019-11-25 DIAGNOSIS — R262 Difficulty in walking, not elsewhere classified: Secondary | ICD-10-CM | POA: Diagnosis not present

## 2019-11-25 DIAGNOSIS — A419 Sepsis, unspecified organism: Secondary | ICD-10-CM | POA: Diagnosis not present

## 2019-11-25 DIAGNOSIS — J189 Pneumonia, unspecified organism: Secondary | ICD-10-CM | POA: Diagnosis not present

## 2019-11-25 DIAGNOSIS — I5083 High output heart failure: Secondary | ICD-10-CM | POA: Diagnosis not present

## 2019-11-25 DIAGNOSIS — F331 Major depressive disorder, recurrent, moderate: Secondary | ICD-10-CM | POA: Diagnosis not present

## 2019-11-25 DIAGNOSIS — K560 Paralytic ileus: Secondary | ICD-10-CM | POA: Diagnosis not present

## 2019-11-25 DIAGNOSIS — R1310 Dysphagia, unspecified: Secondary | ICD-10-CM | POA: Diagnosis not present

## 2019-11-29 DIAGNOSIS — J984 Other disorders of lung: Secondary | ICD-10-CM | POA: Diagnosis not present

## 2019-11-29 DIAGNOSIS — S32592S Other specified fracture of left pubis, sequela: Secondary | ICD-10-CM | POA: Diagnosis not present

## 2019-11-29 DIAGNOSIS — S32592 Other specified fracture of left pubis: Secondary | ICD-10-CM | POA: Diagnosis not present

## 2019-11-29 DIAGNOSIS — I5022 Chronic systolic (congestive) heart failure: Secondary | ICD-10-CM | POA: Diagnosis not present

## 2019-12-07 DIAGNOSIS — J9601 Acute respiratory failure with hypoxia: Secondary | ICD-10-CM | POA: Diagnosis not present

## 2019-12-07 DIAGNOSIS — R1312 Dysphagia, oropharyngeal phase: Secondary | ICD-10-CM | POA: Diagnosis not present

## 2019-12-07 DIAGNOSIS — D638 Anemia in other chronic diseases classified elsewhere: Secondary | ICD-10-CM | POA: Diagnosis not present

## 2019-12-07 DIAGNOSIS — S32592S Other specified fracture of left pubis, sequela: Secondary | ICD-10-CM | POA: Diagnosis not present

## 2019-12-07 DIAGNOSIS — J984 Other disorders of lung: Secondary | ICD-10-CM | POA: Diagnosis not present

## 2019-12-07 DIAGNOSIS — I5022 Chronic systolic (congestive) heart failure: Secondary | ICD-10-CM | POA: Diagnosis not present

## 2019-12-09 DIAGNOSIS — S32592S Other specified fracture of left pubis, sequela: Secondary | ICD-10-CM | POA: Diagnosis not present

## 2019-12-09 DIAGNOSIS — I5022 Chronic systolic (congestive) heart failure: Secondary | ICD-10-CM | POA: Diagnosis not present

## 2019-12-09 DIAGNOSIS — S32592 Other specified fracture of left pubis: Secondary | ICD-10-CM | POA: Diagnosis not present

## 2019-12-13 DIAGNOSIS — J984 Other disorders of lung: Secondary | ICD-10-CM | POA: Diagnosis not present

## 2019-12-13 DIAGNOSIS — D638 Anemia in other chronic diseases classified elsewhere: Secondary | ICD-10-CM | POA: Diagnosis not present

## 2019-12-13 DIAGNOSIS — I5022 Chronic systolic (congestive) heart failure: Secondary | ICD-10-CM | POA: Diagnosis not present

## 2019-12-13 DIAGNOSIS — F064 Anxiety disorder due to known physiological condition: Secondary | ICD-10-CM | POA: Diagnosis not present

## 2019-12-13 DIAGNOSIS — S32592S Other specified fracture of left pubis, sequela: Secondary | ICD-10-CM | POA: Diagnosis not present

## 2019-12-14 DIAGNOSIS — J984 Other disorders of lung: Secondary | ICD-10-CM | POA: Diagnosis not present

## 2019-12-14 DIAGNOSIS — D638 Anemia in other chronic diseases classified elsewhere: Secondary | ICD-10-CM | POA: Diagnosis not present

## 2019-12-14 DIAGNOSIS — S32592S Other specified fracture of left pubis, sequela: Secondary | ICD-10-CM | POA: Diagnosis not present

## 2019-12-14 DIAGNOSIS — I5022 Chronic systolic (congestive) heart failure: Secondary | ICD-10-CM | POA: Diagnosis not present

## 2019-12-14 DIAGNOSIS — R1312 Dysphagia, oropharyngeal phase: Secondary | ICD-10-CM | POA: Diagnosis not present

## 2019-12-14 DIAGNOSIS — K560 Paralytic ileus: Secondary | ICD-10-CM | POA: Diagnosis not present

## 2019-12-26 DIAGNOSIS — I051 Rheumatic mitral insufficiency: Secondary | ICD-10-CM | POA: Diagnosis not present

## 2019-12-26 DIAGNOSIS — S32810D Multiple fractures of pelvis with stable disruption of pelvic ring, subsequent encounter for fracture with routine healing: Secondary | ICD-10-CM | POA: Diagnosis not present

## 2019-12-26 DIAGNOSIS — L89622 Pressure ulcer of left heel, stage 2: Secondary | ICD-10-CM | POA: Diagnosis not present

## 2019-12-26 DIAGNOSIS — G8929 Other chronic pain: Secondary | ICD-10-CM | POA: Diagnosis not present

## 2019-12-26 DIAGNOSIS — J449 Chronic obstructive pulmonary disease, unspecified: Secondary | ICD-10-CM | POA: Diagnosis not present

## 2019-12-26 DIAGNOSIS — I5022 Chronic systolic (congestive) heart failure: Secondary | ICD-10-CM | POA: Diagnosis not present

## 2019-12-26 DIAGNOSIS — Z96641 Presence of right artificial hip joint: Secondary | ICD-10-CM | POA: Diagnosis not present

## 2019-12-26 DIAGNOSIS — I890 Lymphedema, not elsewhere classified: Secondary | ICD-10-CM | POA: Diagnosis not present

## 2019-12-26 DIAGNOSIS — D500 Iron deficiency anemia secondary to blood loss (chronic): Secondary | ICD-10-CM | POA: Diagnosis not present

## 2019-12-26 DIAGNOSIS — K219 Gastro-esophageal reflux disease without esophagitis: Secondary | ICD-10-CM | POA: Diagnosis not present

## 2019-12-26 DIAGNOSIS — Z7951 Long term (current) use of inhaled steroids: Secondary | ICD-10-CM | POA: Diagnosis not present

## 2019-12-26 DIAGNOSIS — I872 Venous insufficiency (chronic) (peripheral): Secondary | ICD-10-CM | POA: Diagnosis not present

## 2019-12-26 DIAGNOSIS — Z8701 Personal history of pneumonia (recurrent): Secondary | ICD-10-CM | POA: Diagnosis not present

## 2019-12-26 DIAGNOSIS — R1310 Dysphagia, unspecified: Secondary | ICD-10-CM | POA: Diagnosis not present

## 2019-12-27 DIAGNOSIS — J189 Pneumonia, unspecified organism: Secondary | ICD-10-CM | POA: Diagnosis not present

## 2019-12-27 DIAGNOSIS — Z09 Encounter for follow-up examination after completed treatment for conditions other than malignant neoplasm: Secondary | ICD-10-CM | POA: Diagnosis not present

## 2019-12-27 DIAGNOSIS — A419 Sepsis, unspecified organism: Secondary | ICD-10-CM | POA: Diagnosis not present

## 2020-01-06 DIAGNOSIS — I5022 Chronic systolic (congestive) heart failure: Secondary | ICD-10-CM | POA: Diagnosis not present

## 2020-01-06 DIAGNOSIS — L89622 Pressure ulcer of left heel, stage 2: Secondary | ICD-10-CM | POA: Diagnosis not present

## 2020-01-06 DIAGNOSIS — I872 Venous insufficiency (chronic) (peripheral): Secondary | ICD-10-CM | POA: Diagnosis not present

## 2020-01-06 DIAGNOSIS — Z8701 Personal history of pneumonia (recurrent): Secondary | ICD-10-CM | POA: Diagnosis not present

## 2020-01-06 DIAGNOSIS — G8929 Other chronic pain: Secondary | ICD-10-CM | POA: Diagnosis not present

## 2020-01-06 DIAGNOSIS — Z96641 Presence of right artificial hip joint: Secondary | ICD-10-CM | POA: Diagnosis not present

## 2020-01-06 DIAGNOSIS — K219 Gastro-esophageal reflux disease without esophagitis: Secondary | ICD-10-CM | POA: Diagnosis not present

## 2020-01-06 DIAGNOSIS — I890 Lymphedema, not elsewhere classified: Secondary | ICD-10-CM | POA: Diagnosis not present

## 2020-01-06 DIAGNOSIS — I051 Rheumatic mitral insufficiency: Secondary | ICD-10-CM | POA: Diagnosis not present

## 2020-01-06 DIAGNOSIS — S32810D Multiple fractures of pelvis with stable disruption of pelvic ring, subsequent encounter for fracture with routine healing: Secondary | ICD-10-CM | POA: Diagnosis not present

## 2020-01-06 DIAGNOSIS — R1310 Dysphagia, unspecified: Secondary | ICD-10-CM | POA: Diagnosis not present

## 2020-01-06 DIAGNOSIS — Z7951 Long term (current) use of inhaled steroids: Secondary | ICD-10-CM | POA: Diagnosis not present

## 2020-01-06 DIAGNOSIS — D500 Iron deficiency anemia secondary to blood loss (chronic): Secondary | ICD-10-CM | POA: Diagnosis not present

## 2020-01-06 DIAGNOSIS — J449 Chronic obstructive pulmonary disease, unspecified: Secondary | ICD-10-CM | POA: Diagnosis not present

## 2020-01-10 DIAGNOSIS — J449 Chronic obstructive pulmonary disease, unspecified: Secondary | ICD-10-CM | POA: Diagnosis not present

## 2020-01-12 ENCOUNTER — Other Ambulatory Visit: Payer: Self-pay

## 2020-01-12 DIAGNOSIS — D500 Iron deficiency anemia secondary to blood loss (chronic): Secondary | ICD-10-CM | POA: Diagnosis not present

## 2020-01-12 DIAGNOSIS — I051 Rheumatic mitral insufficiency: Secondary | ICD-10-CM | POA: Diagnosis not present

## 2020-01-12 DIAGNOSIS — I890 Lymphedema, not elsewhere classified: Secondary | ICD-10-CM | POA: Diagnosis not present

## 2020-01-12 DIAGNOSIS — Z96641 Presence of right artificial hip joint: Secondary | ICD-10-CM | POA: Diagnosis not present

## 2020-01-12 DIAGNOSIS — K219 Gastro-esophageal reflux disease without esophagitis: Secondary | ICD-10-CM | POA: Diagnosis not present

## 2020-01-12 DIAGNOSIS — J449 Chronic obstructive pulmonary disease, unspecified: Secondary | ICD-10-CM | POA: Diagnosis not present

## 2020-01-12 DIAGNOSIS — I872 Venous insufficiency (chronic) (peripheral): Secondary | ICD-10-CM | POA: Diagnosis not present

## 2020-01-12 DIAGNOSIS — R1310 Dysphagia, unspecified: Secondary | ICD-10-CM | POA: Diagnosis not present

## 2020-01-12 DIAGNOSIS — S32810D Multiple fractures of pelvis with stable disruption of pelvic ring, subsequent encounter for fracture with routine healing: Secondary | ICD-10-CM | POA: Diagnosis not present

## 2020-01-12 DIAGNOSIS — I5022 Chronic systolic (congestive) heart failure: Secondary | ICD-10-CM | POA: Diagnosis not present

## 2020-01-12 DIAGNOSIS — G8929 Other chronic pain: Secondary | ICD-10-CM | POA: Diagnosis not present

## 2020-01-12 DIAGNOSIS — Z7951 Long term (current) use of inhaled steroids: Secondary | ICD-10-CM | POA: Diagnosis not present

## 2020-01-12 DIAGNOSIS — Z8701 Personal history of pneumonia (recurrent): Secondary | ICD-10-CM | POA: Diagnosis not present

## 2020-01-12 DIAGNOSIS — L89622 Pressure ulcer of left heel, stage 2: Secondary | ICD-10-CM | POA: Diagnosis not present

## 2020-01-12 NOTE — Telephone Encounter (Signed)
Pt called requesting a refill on her Lyrica. Pt is currently at [facility] and is using the [facility] at [address].

## 2020-01-13 MED ORDER — PREGABALIN 50 MG PO CAPS: 50.0000 mg | ORAL_CAPSULE | Freq: Two times a day (BID) | ORAL | 0 refills | Status: DC

## 2020-01-15 ENCOUNTER — Other Ambulatory Visit: Payer: Self-pay | Admitting: Internal Medicine

## 2020-01-19 DIAGNOSIS — K219 Gastro-esophageal reflux disease without esophagitis: Secondary | ICD-10-CM | POA: Diagnosis not present

## 2020-01-19 DIAGNOSIS — I872 Venous insufficiency (chronic) (peripheral): Secondary | ICD-10-CM | POA: Diagnosis not present

## 2020-01-19 DIAGNOSIS — J449 Chronic obstructive pulmonary disease, unspecified: Secondary | ICD-10-CM | POA: Diagnosis not present

## 2020-01-19 DIAGNOSIS — Z7951 Long term (current) use of inhaled steroids: Secondary | ICD-10-CM | POA: Diagnosis not present

## 2020-01-19 DIAGNOSIS — D500 Iron deficiency anemia secondary to blood loss (chronic): Secondary | ICD-10-CM | POA: Diagnosis not present

## 2020-01-19 DIAGNOSIS — R1310 Dysphagia, unspecified: Secondary | ICD-10-CM | POA: Diagnosis not present

## 2020-01-19 DIAGNOSIS — L89622 Pressure ulcer of left heel, stage 2: Secondary | ICD-10-CM | POA: Diagnosis not present

## 2020-01-19 DIAGNOSIS — I5022 Chronic systolic (congestive) heart failure: Secondary | ICD-10-CM | POA: Diagnosis not present

## 2020-01-19 DIAGNOSIS — G8929 Other chronic pain: Secondary | ICD-10-CM | POA: Diagnosis not present

## 2020-01-19 DIAGNOSIS — Z8701 Personal history of pneumonia (recurrent): Secondary | ICD-10-CM | POA: Diagnosis not present

## 2020-01-19 DIAGNOSIS — S32810D Multiple fractures of pelvis with stable disruption of pelvic ring, subsequent encounter for fracture with routine healing: Secondary | ICD-10-CM | POA: Diagnosis not present

## 2020-01-19 DIAGNOSIS — Z96641 Presence of right artificial hip joint: Secondary | ICD-10-CM | POA: Diagnosis not present

## 2020-01-19 DIAGNOSIS — I051 Rheumatic mitral insufficiency: Secondary | ICD-10-CM | POA: Diagnosis not present

## 2020-01-19 DIAGNOSIS — I890 Lymphedema, not elsewhere classified: Secondary | ICD-10-CM | POA: Diagnosis not present

## 2020-01-20 DIAGNOSIS — Z7951 Long term (current) use of inhaled steroids: Secondary | ICD-10-CM | POA: Diagnosis not present

## 2020-01-20 DIAGNOSIS — R1310 Dysphagia, unspecified: Secondary | ICD-10-CM | POA: Diagnosis not present

## 2020-01-20 DIAGNOSIS — D500 Iron deficiency anemia secondary to blood loss (chronic): Secondary | ICD-10-CM | POA: Diagnosis not present

## 2020-01-20 DIAGNOSIS — Z96641 Presence of right artificial hip joint: Secondary | ICD-10-CM | POA: Diagnosis not present

## 2020-01-20 DIAGNOSIS — L89622 Pressure ulcer of left heel, stage 2: Secondary | ICD-10-CM | POA: Diagnosis not present

## 2020-01-20 DIAGNOSIS — S32810D Multiple fractures of pelvis with stable disruption of pelvic ring, subsequent encounter for fracture with routine healing: Secondary | ICD-10-CM | POA: Diagnosis not present

## 2020-01-20 DIAGNOSIS — I890 Lymphedema, not elsewhere classified: Secondary | ICD-10-CM | POA: Diagnosis not present

## 2020-01-20 DIAGNOSIS — Z8701 Personal history of pneumonia (recurrent): Secondary | ICD-10-CM | POA: Diagnosis not present

## 2020-01-20 DIAGNOSIS — I5022 Chronic systolic (congestive) heart failure: Secondary | ICD-10-CM | POA: Diagnosis not present

## 2020-01-20 DIAGNOSIS — K219 Gastro-esophageal reflux disease without esophagitis: Secondary | ICD-10-CM | POA: Diagnosis not present

## 2020-01-20 DIAGNOSIS — I872 Venous insufficiency (chronic) (peripheral): Secondary | ICD-10-CM | POA: Diagnosis not present

## 2020-01-20 DIAGNOSIS — G8929 Other chronic pain: Secondary | ICD-10-CM | POA: Diagnosis not present

## 2020-01-20 DIAGNOSIS — I051 Rheumatic mitral insufficiency: Secondary | ICD-10-CM | POA: Diagnosis not present

## 2020-01-20 DIAGNOSIS — J449 Chronic obstructive pulmonary disease, unspecified: Secondary | ICD-10-CM | POA: Diagnosis not present

## 2020-01-27 ENCOUNTER — Other Ambulatory Visit: Payer: Self-pay | Admitting: *Deleted

## 2020-01-31 MED ORDER — METHOCARBAMOL 750 MG PO TABS: 750.0000 mg | ORAL_TABLET | Freq: Four times a day (QID) | ORAL | 0 refills | Status: DC | PRN

## 2020-01-31 NOTE — Addendum Note (Signed)
Addended by: Alois Cliche on: 01/31/2020 02:09 PM   Modules accepted: Orders

## 2020-02-08 DIAGNOSIS — S32810D Multiple fractures of pelvis with stable disruption of pelvic ring, subsequent encounter for fracture with routine healing: Secondary | ICD-10-CM | POA: Diagnosis not present

## 2020-02-08 DIAGNOSIS — I872 Venous insufficiency (chronic) (peripheral): Secondary | ICD-10-CM | POA: Diagnosis not present

## 2020-02-08 DIAGNOSIS — Z7951 Long term (current) use of inhaled steroids: Secondary | ICD-10-CM | POA: Diagnosis not present

## 2020-02-08 DIAGNOSIS — G8929 Other chronic pain: Secondary | ICD-10-CM | POA: Diagnosis not present

## 2020-02-08 DIAGNOSIS — L89622 Pressure ulcer of left heel, stage 2: Secondary | ICD-10-CM | POA: Diagnosis not present

## 2020-02-08 DIAGNOSIS — Z96641 Presence of right artificial hip joint: Secondary | ICD-10-CM | POA: Diagnosis not present

## 2020-02-08 DIAGNOSIS — I051 Rheumatic mitral insufficiency: Secondary | ICD-10-CM | POA: Diagnosis not present

## 2020-02-08 DIAGNOSIS — R1310 Dysphagia, unspecified: Secondary | ICD-10-CM | POA: Diagnosis not present

## 2020-02-08 DIAGNOSIS — K219 Gastro-esophageal reflux disease without esophagitis: Secondary | ICD-10-CM | POA: Diagnosis not present

## 2020-02-08 DIAGNOSIS — I5022 Chronic systolic (congestive) heart failure: Secondary | ICD-10-CM | POA: Diagnosis not present

## 2020-02-08 DIAGNOSIS — D500 Iron deficiency anemia secondary to blood loss (chronic): Secondary | ICD-10-CM | POA: Diagnosis not present

## 2020-02-08 DIAGNOSIS — Z8701 Personal history of pneumonia (recurrent): Secondary | ICD-10-CM | POA: Diagnosis not present

## 2020-02-08 DIAGNOSIS — I890 Lymphedema, not elsewhere classified: Secondary | ICD-10-CM | POA: Diagnosis not present

## 2020-02-08 DIAGNOSIS — J449 Chronic obstructive pulmonary disease, unspecified: Secondary | ICD-10-CM | POA: Diagnosis not present

## 2020-02-10 IMAGING — CR DG CHEST 2V
2 series · 2 of 2 positions shown · non-contrast
Comparison: None

CLINICAL DATA: Shortness of breath and chest pain today, asthma,
pneumonia, bronchitis, COPD, CHF

EXAM:
CHEST - 2 VIEW

[chest pa]
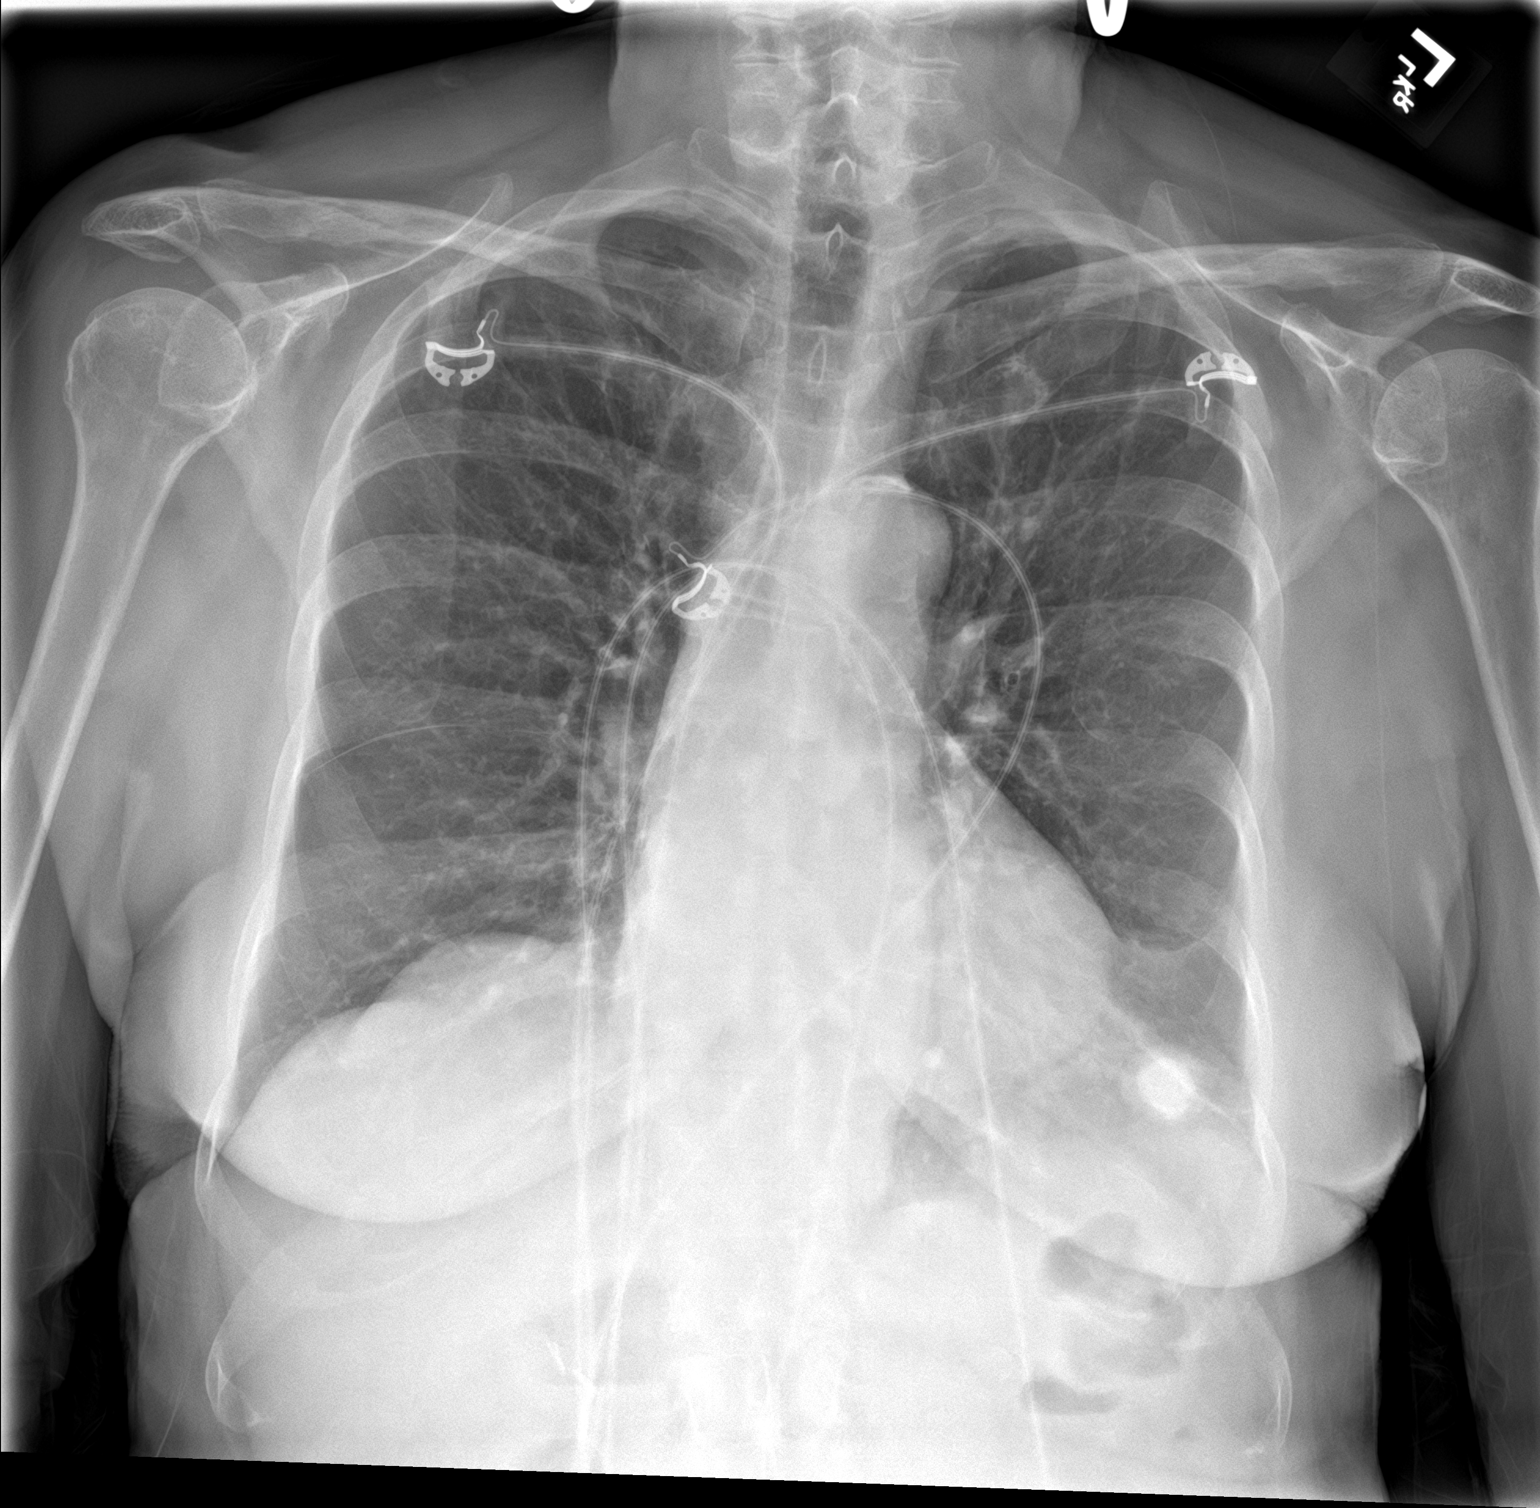

[chest lat]
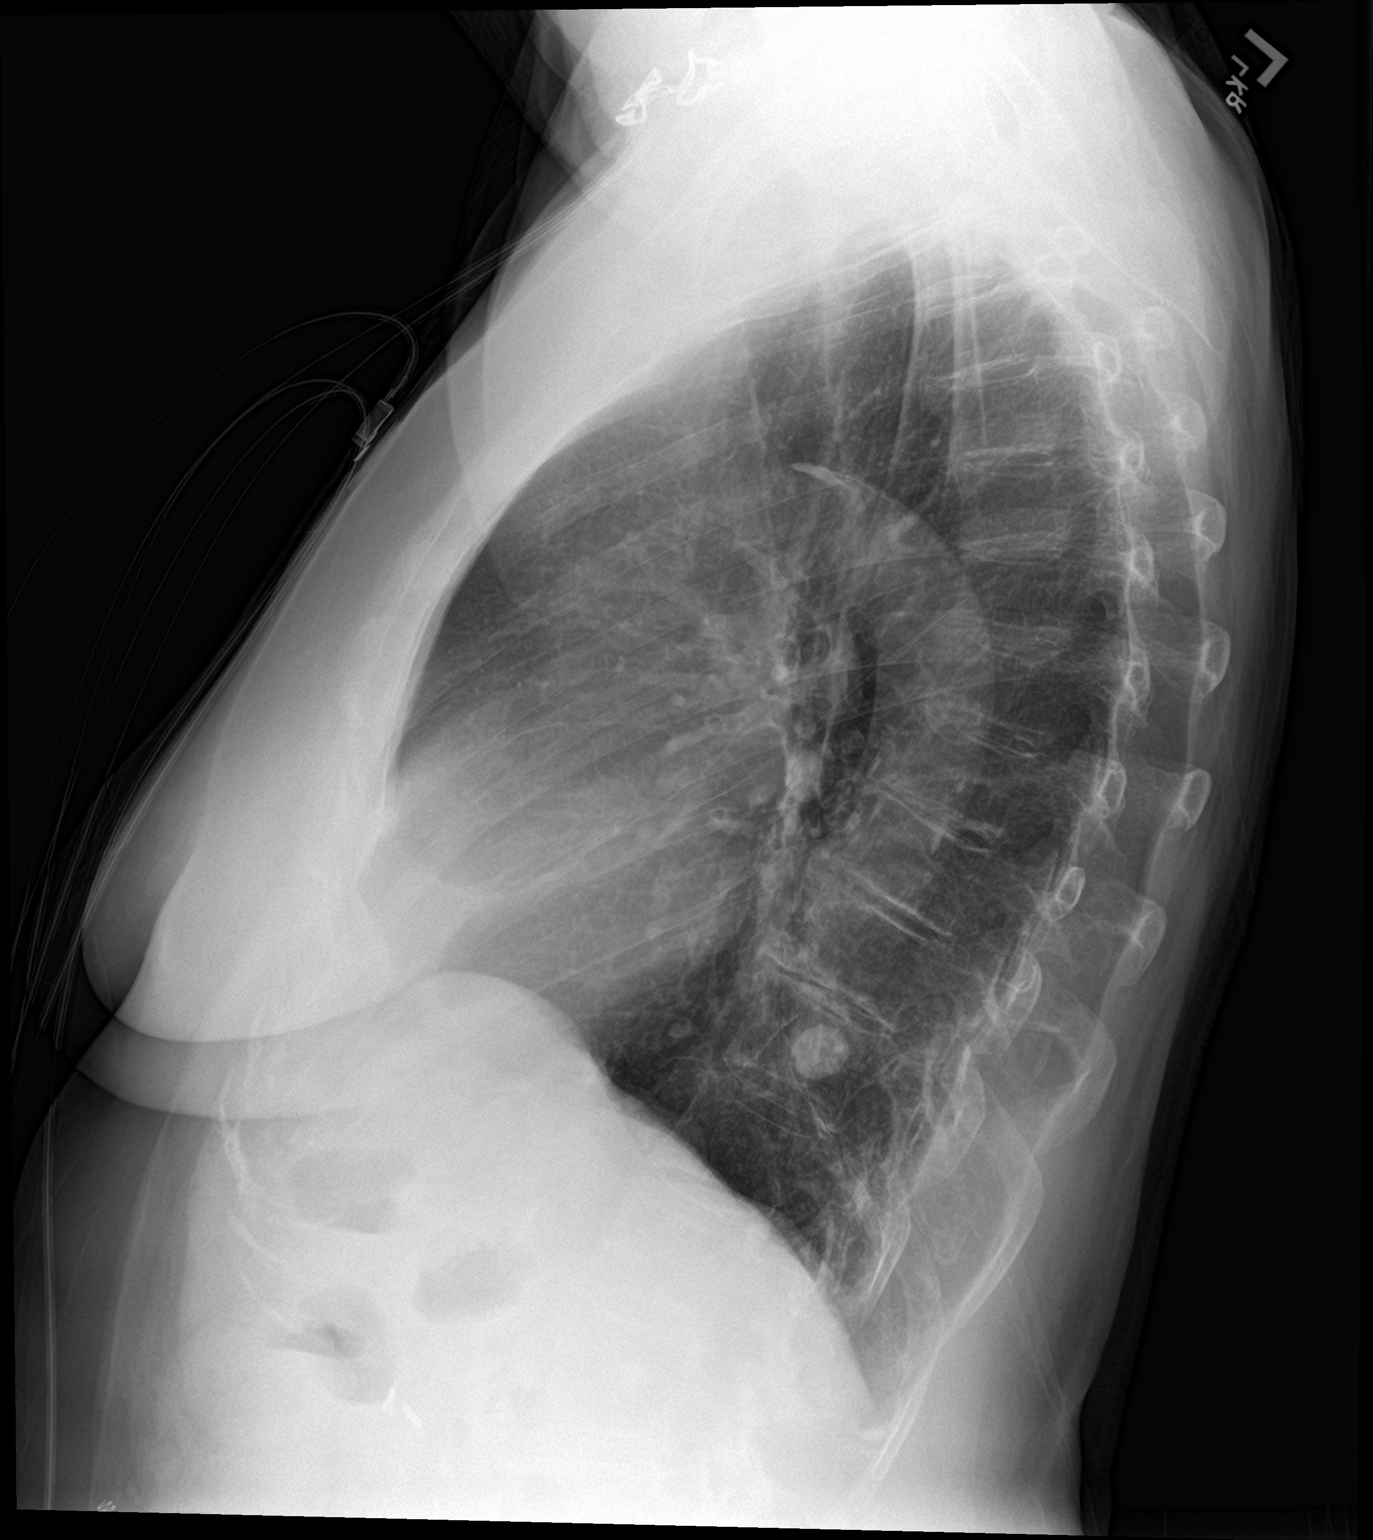

[2 of 2 positions shown; findings below may reference images not displayed]

FINDINGS: Normal heart size, mediastinal contours, and pulmonary vascularity.

Atherosclerotic calcification aorta.

Minimal peribronchial thickening.

Large calcified granuloma LEFT lower lobe with question additional
smaller calcified granuloma at medial LEFT lung base.

No acute infiltrate, pleural effusion, or pneumothorax.

Bones demineralized.
IMPRESSION: Mild bronchitic and old granulomatous disease changes.

No acute abnormalities.

## 2020-02-14 ENCOUNTER — Other Ambulatory Visit: Payer: Self-pay

## 2020-02-22 ENCOUNTER — Other Ambulatory Visit: Payer: Self-pay | Admitting: Internal Medicine

## 2020-02-22 DIAGNOSIS — J449 Chronic obstructive pulmonary disease, unspecified: Secondary | ICD-10-CM | POA: Diagnosis not present

## 2020-02-22 DIAGNOSIS — I051 Rheumatic mitral insufficiency: Secondary | ICD-10-CM | POA: Diagnosis not present

## 2020-02-22 DIAGNOSIS — I890 Lymphedema, not elsewhere classified: Secondary | ICD-10-CM | POA: Diagnosis not present

## 2020-02-22 DIAGNOSIS — I5022 Chronic systolic (congestive) heart failure: Secondary | ICD-10-CM | POA: Diagnosis not present

## 2020-02-22 DIAGNOSIS — Z8701 Personal history of pneumonia (recurrent): Secondary | ICD-10-CM | POA: Diagnosis not present

## 2020-02-22 DIAGNOSIS — Z7951 Long term (current) use of inhaled steroids: Secondary | ICD-10-CM | POA: Diagnosis not present

## 2020-02-22 DIAGNOSIS — D500 Iron deficiency anemia secondary to blood loss (chronic): Secondary | ICD-10-CM | POA: Diagnosis not present

## 2020-02-22 DIAGNOSIS — G8929 Other chronic pain: Secondary | ICD-10-CM | POA: Diagnosis not present

## 2020-02-22 DIAGNOSIS — Z96641 Presence of right artificial hip joint: Secondary | ICD-10-CM | POA: Diagnosis not present

## 2020-02-22 DIAGNOSIS — L89622 Pressure ulcer of left heel, stage 2: Secondary | ICD-10-CM | POA: Diagnosis not present

## 2020-02-22 DIAGNOSIS — I872 Venous insufficiency (chronic) (peripheral): Secondary | ICD-10-CM | POA: Diagnosis not present

## 2020-02-22 DIAGNOSIS — K219 Gastro-esophageal reflux disease without esophagitis: Secondary | ICD-10-CM | POA: Diagnosis not present

## 2020-02-22 DIAGNOSIS — S32810D Multiple fractures of pelvis with stable disruption of pelvic ring, subsequent encounter for fracture with routine healing: Secondary | ICD-10-CM | POA: Diagnosis not present

## 2020-02-22 DIAGNOSIS — R1310 Dysphagia, unspecified: Secondary | ICD-10-CM | POA: Diagnosis not present

## 2020-02-24 ENCOUNTER — Other Ambulatory Visit: Payer: Self-pay | Admitting: *Deleted

## 2020-02-24 MED ORDER — PREGABALIN 50 MG PO CAPS: ORAL_CAPSULE | ORAL | 0 refills | Status: DC

## 2020-02-27 ENCOUNTER — Ambulatory Visit (INDEPENDENT_AMBULATORY_CARE_PROVIDER_SITE_OTHER): Payer: PPO | Admitting: Internal Medicine

## 2020-02-27 ENCOUNTER — Encounter: Payer: Self-pay | Admitting: Internal Medicine

## 2020-02-27 ENCOUNTER — Other Ambulatory Visit: Payer: Self-pay

## 2020-02-27 VITALS — BP 119/71 | HR 101 | Ht 69.0 in | Wt 156.6 lb

## 2020-02-27 DIAGNOSIS — M8000XS Age-related osteoporosis with current pathological fracture, unspecified site, sequela: Secondary | ICD-10-CM

## 2020-02-27 DIAGNOSIS — M546 Pain in thoracic spine: Secondary | ICD-10-CM | POA: Diagnosis not present

## 2020-02-27 DIAGNOSIS — D649 Anemia, unspecified: Secondary | ICD-10-CM | POA: Diagnosis not present

## 2020-02-27 DIAGNOSIS — J449 Chronic obstructive pulmonary disease, unspecified: Secondary | ICD-10-CM

## 2020-02-27 DIAGNOSIS — G8929 Other chronic pain: Secondary | ICD-10-CM | POA: Diagnosis not present

## 2020-02-27 NOTE — Progress Notes (Signed)
Established Patient Office Visit  SUBJECTIVE:  Subjective  Patient ID: Teresa Chambers, female    DOB: 13-Aug-1935  Age: 84 y.o. MRN: [id number]  CC:  Chief Complaint  Patient presents with  . Annual Exam    HPI Teresa Chambers is a 84 y.o. female presenting today for an annual exam.  Arasely was admitted to the hospital from the ED on 11/10/2019 for treatment of right lower lobe pneumonia. She was in significant pain due to recent pubic ramus fracture and was not able to complete much physical therapy. She developed a cough with hypoxia 86% on room air. In ED she was found to have right lower lobe consolidation and met sepsis criteria (WBC 16800, H/H 10.5/32.5, sodium 129, creatinine 1.43, glucose 114, troponin 0.018) secondary to pneumonia. She was treated and discharged to a nursing home for physical therapy. She was allowed to return home under the condition that she see her primary care physician and establish physical therapy care.   She notes that she experiences back pain, neuropathy of the legs, and difficulty walking.   She does need some medication refills.   Past Medical History:  Diagnosis Date  . Actinic keratosis   . Acute thoracic back pain   . Asthma   . Chest pain    unspecified  . CHF (congestive heart failure) ([place]) 04/30/2017  . Chronic abdominal pain 05/01/2017   unspecified  . Congenital heart failure ([place])   . COPD (chronic obstructive pulmonary disease) ([place])   . Disease of upper respiratory system 04/30/2017  . Dyspnea on exertion   . Esophageal reflux disease 04/30/2017  . History of bone density study 06/28/2004  . Leg swelling   . Lumbar arthropathy   . Nasopharyngitis 04/30/2017  . Osteoarthropathy 04/30/2017  . Osteoporosis 04/30/2017  . Palpitations   . Sinusitis, acute 04/30/2017   unspecified  . Squamous cell carcinoma of skin 07/28/2017   Right medial knee. Hypertrophic SCCis. Tx: [facility] 07/28/2017    Past Surgical History:  Procedure  Laterality Date  . CATARACT EXTRACTION     07/21/2001 - 07/20/2002  . CHOLECYSTECTOMY     07/21/1997 - 07/20/1998  . COLON SURGERY    . COLONOSCOPY     05/05/2000, 01/17/2000 Adenomatous Polyps   . COLONOSCOPY     11/05/2009, 12/23/2004, 07/07/2001   PH Adenomatous Polyps: [facility] 10/2014; recall ltr mailed 09/18/2014 (dw)  . ESOPHAGOGASTRODUODENOSCOPY     11/05/2009, 05/05/2000  . FLEXIBLE SIGMOIDOSCOPY  11/28/1999  . HAND SURGERY  2006  . HERNIA REPAIR    . HIP ARTHROPLASTY Right 02/11/2018   Procedure: ARTHROPLASTY BIPOLAR HIP (HEMIARTHROPLASTY);  Surgeon: Corky Mull, MD;  Location: [facility] ORS;  Service: Orthopedics;  Laterality: Right;  . LAPAROSCOPIC COLON RESECTION     2001    Family History  Problem Relation Age of Onset  . Heart disease Mother   . Heart disease Father   . Hypertension Son   . Hypertension Daughter     Social History   Socioeconomic History  . Marital status: Divorced    Spouse name: Not on file  . Number of children: 5  . Years of education: 15.5  . Highest education level: High school graduate  Occupational History  . Occupation: retired  Tobacco Use  . Smoking status: Never Smoker  . Smokeless tobacco: Never Used  Vaping Use  . Vaping Use: Never used  Substance and Sexual Activity  . Alcohol use: Not Currently  . Drug use: Never  .  Sexual activity: Not Currently  Other Topics Concern  . Not on file  Social History Narrative   Lives independently, has home at [facility] she enjoys traveling to   Social Determinants of Health   Financial Resource Strain:   . Difficulty of Paying Living Expenses:   Food Insecurity:   . Worried About Charity fundraiser in the Last Year:   . Arboriculturist in the Last Year:   Transportation Needs:   . Film/video editor (Medical):   Marland Kitchen Lack of Transportation (Non-Medical):   Physical Activity:   . Days of Exercise per Week:   . Minutes of Exercise per Session:   Stress:   . Feeling of Stress :    Social Connections:   . Frequency of Communication with Friends and Family:   . Frequency of Social Gatherings with Friends and Family:   . Attends Religious Services:   . Active Member of Clubs or Organizations:   . Attends Archivist Meetings:   Marland Kitchen Marital Status:   Intimate Partner Violence:   . Fear of Current or Ex-Partner:   . Emotionally Abused:   Marland Kitchen Physically Abused:   . Sexually Abused:      Current Outpatient Medications:  .  acetaminophen (TYLENOL) 325 MG tablet, Take 1-2 tablets (325-650 mg total) by mouth every 6 (six) hours as needed for mild pain (pain score 1-3 or temp > 100.5)., Disp: , Rfl:  .  albuterol (PROVENTIL HFA;VENTOLIN HFA) 108 (90 Base) MCG/ACT inhaler, Inhale 2 puffs into the lungs every 6 (six) hours as needed for wheezing or shortness of breath., Disp: 18 g, Rfl: 0 .  albuterol (PROVENTIL) (2.5 MG/3ML) 0.083% nebulizer solution, Inhale 3 mLs (2.5 mg total) into the lungs 4 (four) times daily., Disp: 75 mL, Rfl: 0 .  Ascorbic Acid (VITAMIN C WITH ROSE HIPS) 1000 MG tablet, Take 1,000 mg by mouth daily., Disp: , Rfl:  .  Calcium Carbonate-Vitamin D (CALCIUM 600+D) 600-400 MG-UNIT tablet, Take 1 tablet by mouth daily., Disp: , Rfl:  .  celecoxib (CELEBREX) 50 MG capsule, Take 50 mg by mouth 2 (two) times daily., Disp: , Rfl:  .  diltiazem (DILACOR XR) 120 MG 24 hr capsule, Take 120 mg by mouth daily., Disp: , Rfl:  .  DULoxetine (CYMBALTA) 60 MG capsule, Take 1 capsule (60 mg total) by mouth 2 (two) times daily., Disp: 60 capsule, Rfl: 0 .  ENTRESTO 24-26 MG, Take 1 tablet by mouth 2 (two) times daily., Disp: , Rfl:  .  fluticasone (FLONASE) 50 MCG/ACT nasal spray, Place 2 sprays into both nostrils daily., Disp: 16 g, Rfl: 0 .  fluticasone furoate-vilanterol (BREO ELLIPTA) 200-25 MCG/INH AEPB, Inhale 1 puff into the lungs daily., Disp: , Rfl:  .  guaiFENesin-dextromethorphan (ROBITUSSIN DM) 100-10 MG/5ML syrup, Take 5 mLs by mouth every 6 (six) hours  as needed for cough., Disp: 118 mL, Rfl: 0 .  methocarbamol (ROBAXIN) 750 MG tablet, Take 1 tablet (750 mg total) by mouth every 6 (six) hours as needed., Disp: 90 tablet, Rfl: 0 .  montelukast (SINGULAIR) 10 MG tablet, Take 1 tablet (10 mg total) by mouth daily., Disp: 30 tablet, Rfl: 0 .  omeprazole (PRILOSEC) 40 MG capsule, Take 1 capsule (40 mg total) by mouth 2 (two) times daily. (Patient taking differently: Take 40 mg by mouth daily. ), Disp: 60 capsule, Rfl: 0 .  pregabalin (LYRICA) 50 MG capsule, TAKE 1 CAPSULE(50 MG) BY MOUTH TWICE DAILY, Disp:  60 capsule, Rfl: 0 .  simvastatin (ZOCOR) 40 MG tablet, Take 1 tablet (40 mg total) by mouth daily., Disp: 30 tablet, Rfl: 0   Allergies  Allergen Reactions  . Codeine Nausea And Vomiting  . Lidocaine Swelling    ROS Review of Systems  Constitutional: Negative.   HENT: Negative.   Eyes: Negative.   Respiratory: Negative.   Cardiovascular: Negative.   Gastrointestinal: Negative.   Endocrine: Negative.   Genitourinary: Positive for urgency.       Incontinence  Musculoskeletal: Positive for back pain and joint swelling.  Skin: Negative.   Allergic/Immunologic: Negative.   Neurological: Negative.   Hematological: Negative.   Psychiatric/Behavioral: Negative.   All other systems reviewed and are negative.    OBJECTIVE:    Physical Exam Vitals reviewed.  Constitutional:      Appearance: Normal appearance.     Comments: Ambulating with a cane  HENT:     Mouth/Throat:     Mouth: Mucous membranes are moist.  Eyes:     Pupils: Pupils are equal, round, and reactive to light.  Neck:     Vascular: No carotid bruit.  Cardiovascular:     Rate and Rhythm: Normal rate and regular rhythm.     Pulses: Normal pulses.     Heart sounds: Normal heart sounds.  Pulmonary:     Effort: Pulmonary effort is normal.     Breath sounds: Normal breath sounds.  Abdominal:     General: Bowel sounds are normal.     Palpations: Abdomen is soft.  There is no hepatomegaly, splenomegaly or mass.     Tenderness: There is no abdominal tenderness.     Hernia: No hernia is present.  Musculoskeletal:        General: No tenderness.     Cervical back: Neck supple.     Right knee: Deformity (Vagus deformity) present.     Right lower leg: 1+ Edema present.     Left lower leg: 1+ Edema present.  Skin:    Findings: No rash.  Neurological:     Mental Status: She is alert and oriented to person, place, and time.     Motor: No weakness.     Gait: Gait abnormal (Hx broken pelvis).  Psychiatric:        Mood and Affect: Mood and affect normal.        Behavior: Behavior normal.     BP 119/71   Pulse (!) 101   Ht _0  (1.753 m)   Wt 156 lb 9.6 oz (71 kg)   BMI 23.13 kg/m  Wt Readings from Last 3 Encounters:  02/27/20 156 lb 9.6 oz (71 kg)  04/15/19 173 lb 6.4 oz (78.7 kg)  12/07/18 176 lb 12.8 oz (80.2 kg)    Health Maintenance Due  Topic Date Due  . URINE MICROALBUMIN  Never done  . COVID-19 Vaccine (1) Never done  . TETANUS/TDAP  Never done  . DEXA SCAN  Never done  . PNA vac Low Risk Adult (1 of 2 - PCV13) Never done  . INFLUENZA VACCINE  02/19/2020    There are no preventive care reminders to display for this patient.  CBC Latest Ref Rng & Units 03/29/2019 02/12/2018 02/11/2018  WBC 4.0 - 10.5 K/uL 7.9 10.9 10.6  Hemoglobin 12.0 - 15.0 g/dL 13.4 12.2 12.8  Hematocrit 36 - 46 % 41.6 34.5(L) 36.9  Platelets 150 - 400 K/uL 261 207 234   CMP Latest Ref Rng & Units 03/29/2019  02/13/2018 02/12/2018  Glucose 70 - 99 mg/dL 103(H) 115(H) 131(H)  BUN 8 - 23 mg/dL _0 Creatinine 0.44 - 1.00 mg/dL 0.72 0.93 0.58  Sodium 135 - 145 mmol/L 141 139 140  Potassium 3.5 - 5.1 mmol/L 4.2 3.7 4.0  Chloride 98 - 111 mmol/L 104 105 106  CO2 22 - 32 mmol/L _1 Calcium 8.9 - 10.3 mg/dL 9.2 8.2(L) 7.9(L)  Total Protein 6.5 - 8.1 g/dL 6.8 - -  Total Bilirubin 0.3 - 1.2 mg/dL 0.7 - -  Alkaline Phos 38 - 126 U/L 48 - -  AST 15 - 41 U/L  18 - -  ALT 0 - 44 U/L 17 - -    Lab Results  Component Value Date   TSH 4.254 02/11/2018   Lab Results  Component Value Date   ALBUMIN 4.0 03/29/2019   ANIONGAP 9 03/29/2019   Lab Results  Component Value Date   CHOL 166 03/29/2019   CHOL 170 06/27/2016   HDL 77 03/29/2019   HDL 66 06/27/2016   LDLCALC 73 03/29/2019   LDLCALC 79 06/27/2016   CHOLHDL 2.2 03/29/2019   CHOLHDL 2.6 06/27/2016   Lab Results  Component Value Date   TRIG 82 03/29/2019   No results found for: HGBA1C    ASSESSMENT & PLAN:   Problem List Items Addressed This Visit      Respiratory   COPD (chronic obstructive pulmonary disease) ([place])    On today's examination she was not found to be wheezing I did not find any rales in the chest.  Her abdomen is soft and nontender and there is no swelling of the legs.  Gait is still ataxic.        Musculoskeletal and Integument   Osteoporosis - Primary    Chronic problem she was advised to continue taking daily calcium and iron panel.  not A candidate for Fosamax.        Other   Chronic anemia    Patient was started on ferrous gluconate to give her some strength.      Chronic bilateral thoracic back pain    Patient has osteoporosis  related back pain.  Also has osteoarthritis of the right knee and history of pelvic fracture for which she is getting physical therapy.  Along with that she has incontinence of the bladder.  She was suggested to continue physical therapy and back exercises to help her ambulate a little bit better she was also advised to continue using a stick to walk         No orders of the defined types were placed in this encounter.     Follow-up: No follow-ups on file.    Dr. Jane Canary [facility] [address] [place], [place] [address]   By signing my name below, I, amber attest that this documentation has been prepared under the direction and in the presence of Cletis Athens, MD. Electronically  Signed: Cletis Athens, MD 02/27/20, 6:19 PM    I personally performed the services described in this documentation, which was SCRIBED in my presence. The recorded information has been reviewed and considered accurate. It has been edited as necessary during review. Cletis Athens, MD

## 2020-02-27 NOTE — Assessment & Plan Note (Signed)
Patient has osteoporosis  related back pain.  Also has osteoarthritis of the right knee and history of pelvic fracture for which she is getting physical therapy.  Along with that she has incontinence of the bladder.  She was suggested to continue physical therapy and back exercises to help her ambulate a little bit better she was also advised to continue using a stick to walk

## 2020-02-27 NOTE — Assessment & Plan Note (Signed)
Patient was started on ferrous gluconate to give her some strength.

## 2020-02-27 NOTE — Assessment & Plan Note (Signed)
On today's examination she was not found to be wheezing I did not find any rales in the chest.  Her abdomen is soft and nontender and there is no swelling of the legs.  Gait is still ataxic.

## 2020-02-27 NOTE — Assessment & Plan Note (Signed)
Chronic problem she was advised to continue taking daily calcium and iron panel.  not A candidate for Fosamax.

## 2020-03-04 IMAGING — DX DG HIP (WITH OR WITHOUT PELVIS) 2-3V*R*
3 series · 3 of 3 positions shown · non-contrast
Comparison: Prior radiograph from 02/11/2018

CLINICAL DATA: Initial evaluation status post right hip
replacement.

EXAM:
DG HIP (WITH OR WITHOUT PELVIS) 2-3V RIGHT

[pelvis ap]
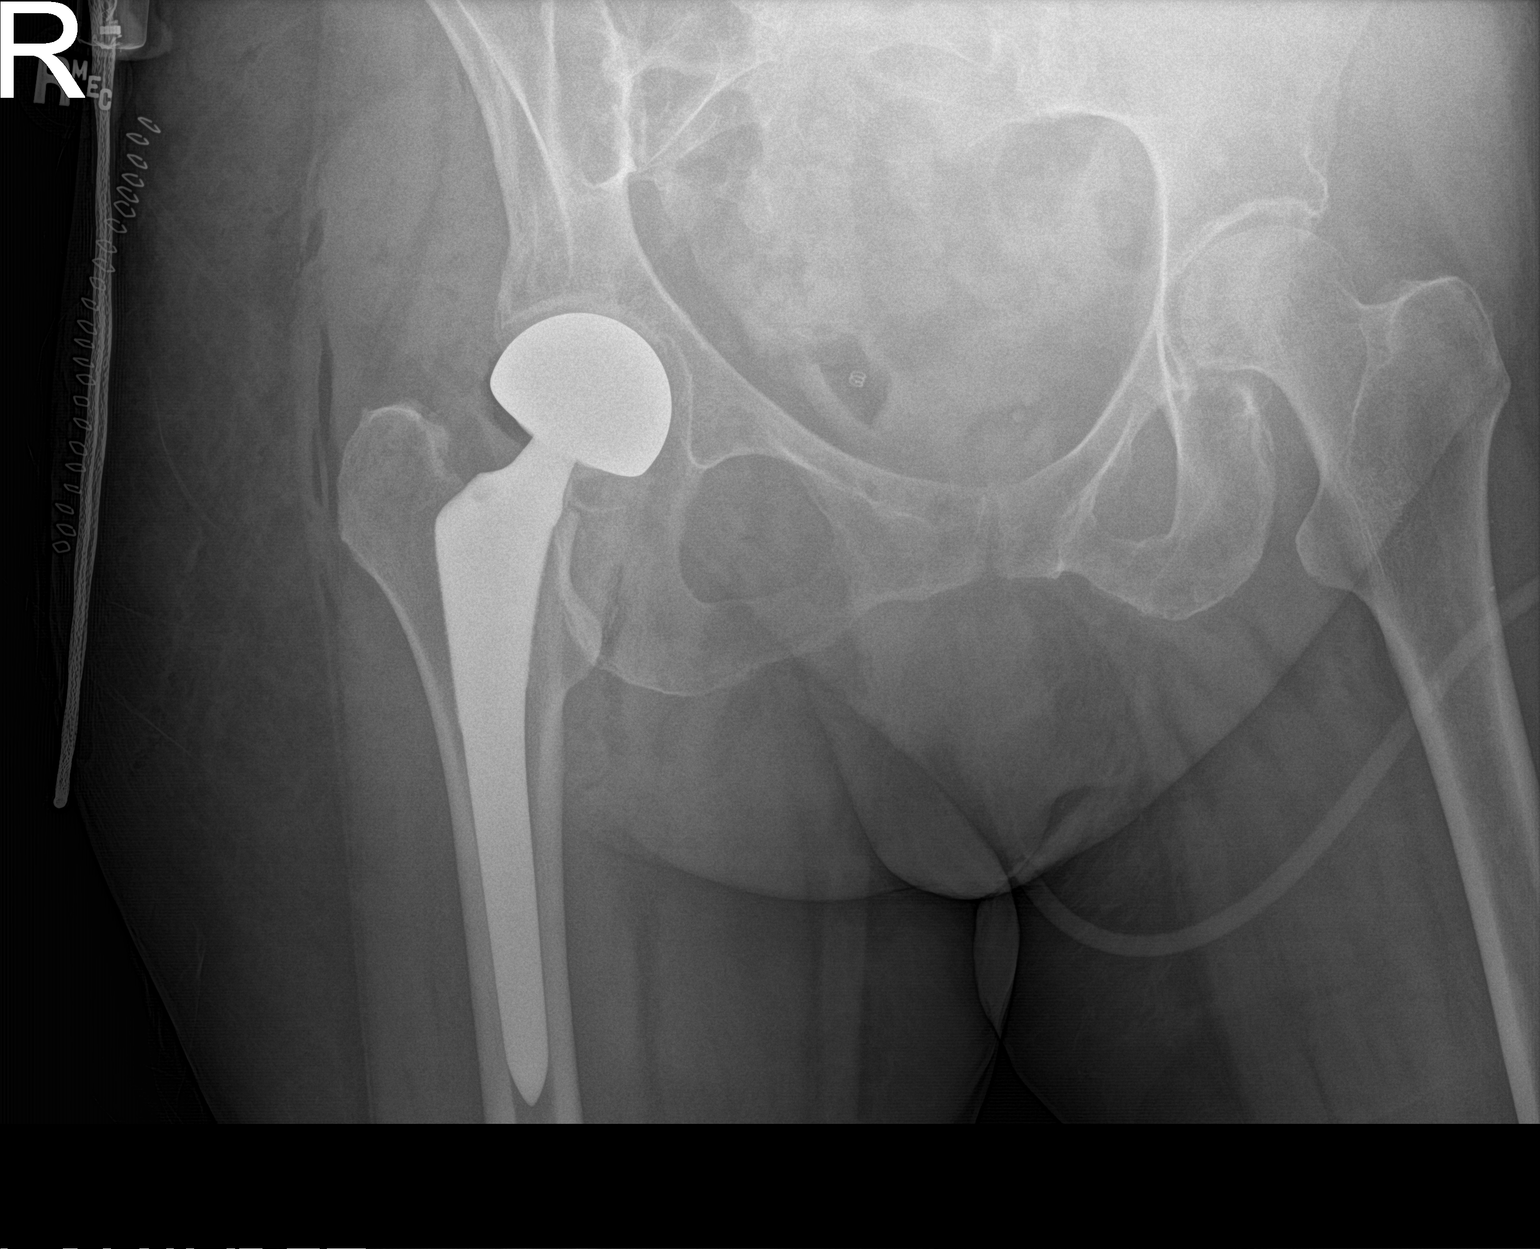

[hip ap]
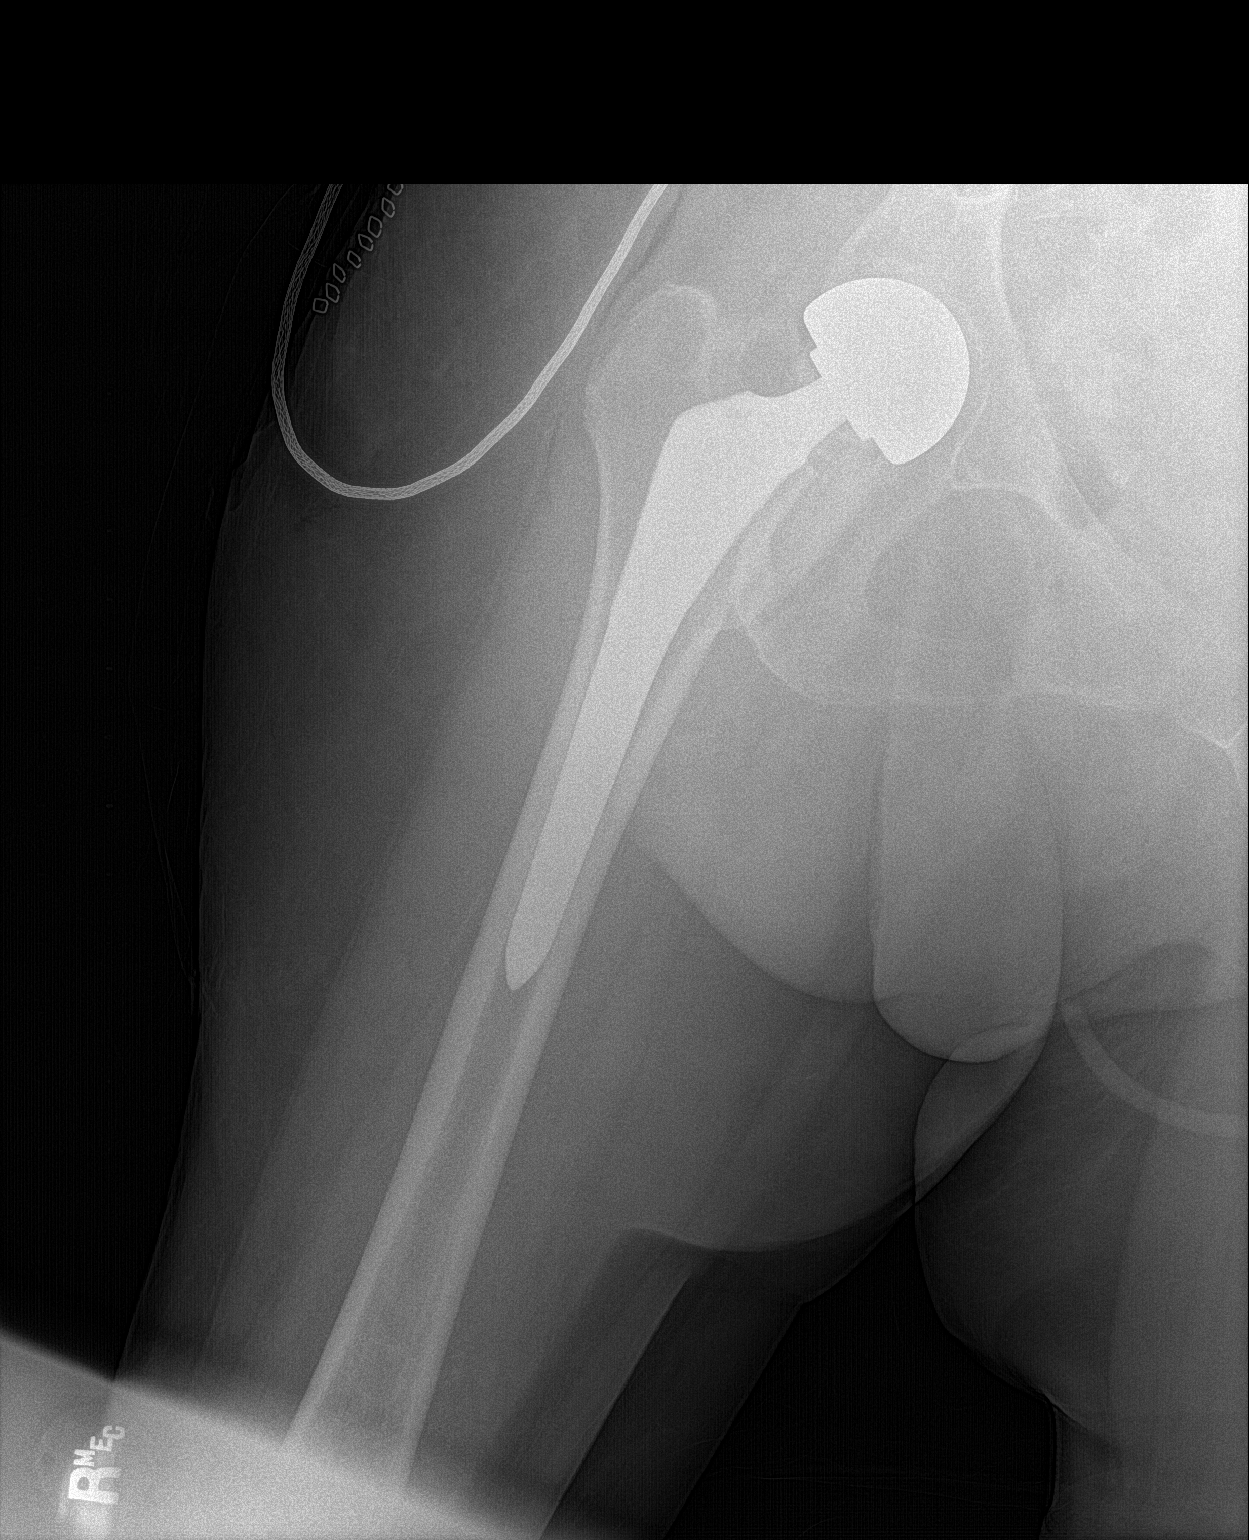

[hip lat]
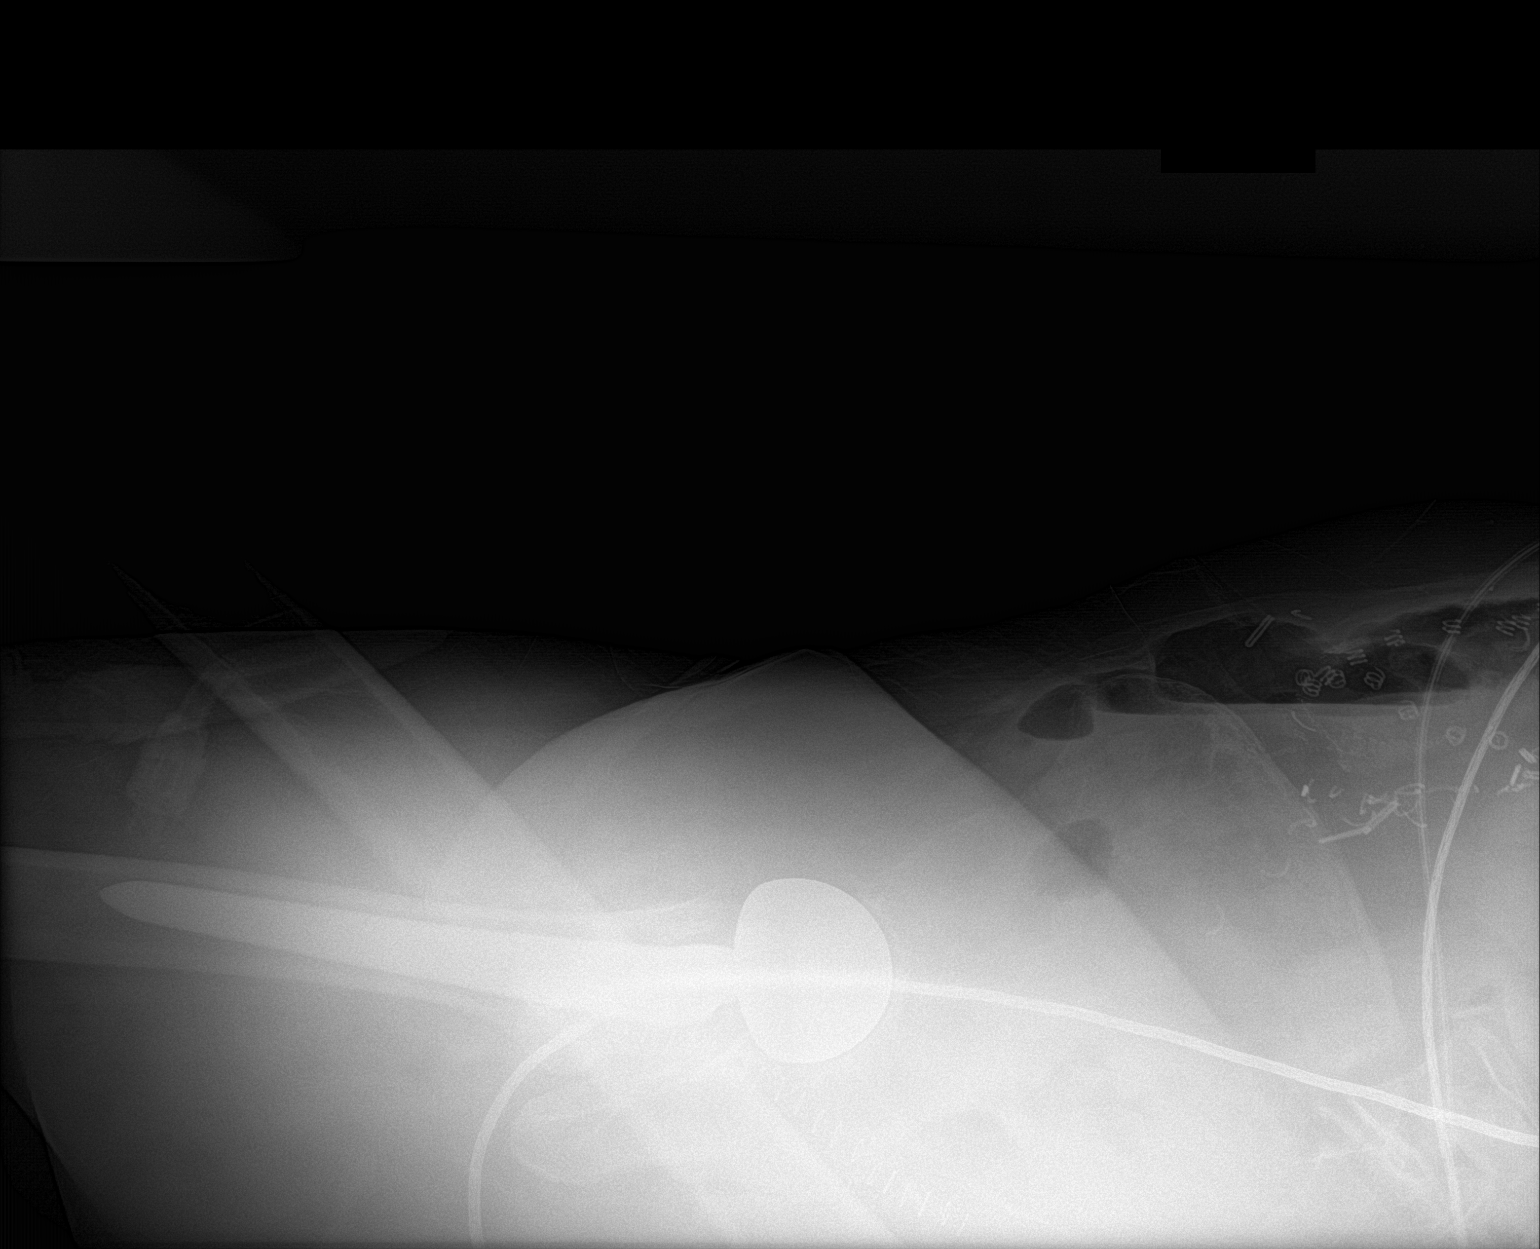

[3 of 3 positions shown; findings below may reference images not displayed]

FINDINGS: Interval placement of a right total hip arthroplasty. Acetabular and
femoral components appear well seated without adverse features. No
periprosthetic fracture or other complication. Postoperative
swelling and emphysema overlies the right hip. Degenerative changes
noted at the left hip.
IMPRESSION: Postoperative changes from interval right total hip arthroplasty
without complication.

## 2020-03-06 ENCOUNTER — Other Ambulatory Visit: Payer: Self-pay

## 2020-03-06 DIAGNOSIS — M8000XS Age-related osteoporosis with current pathological fracture, unspecified site, sequela: Secondary | ICD-10-CM

## 2020-03-08 ENCOUNTER — Telehealth: Payer: Self-pay | Admitting: *Deleted

## 2020-03-08 ENCOUNTER — Other Ambulatory Visit: Payer: Self-pay | Admitting: *Deleted

## 2020-03-08 DIAGNOSIS — F339 Major depressive disorder, recurrent, unspecified: Secondary | ICD-10-CM

## 2020-03-08 DIAGNOSIS — M8000XS Age-related osteoporosis with current pathological fracture, unspecified site, sequela: Secondary | ICD-10-CM

## 2020-03-08 MED ORDER — DULOXETINE HCL 60 MG PO CPEP: 60.0000 mg | ORAL_CAPSULE | Freq: Two times a day (BID) | ORAL | 0 refills | Status: DC

## 2020-03-08 NOTE — Telephone Encounter (Signed)
Patient called requesting 30 day supply of cymbalta. Per Dr. Lavera Guise okay to fill.

## 2020-03-13 ENCOUNTER — Other Ambulatory Visit: Payer: Self-pay | Admitting: *Deleted

## 2020-03-13 DIAGNOSIS — M8000XS Age-related osteoporosis with current pathological fracture, unspecified site, sequela: Secondary | ICD-10-CM

## 2020-03-14 DIAGNOSIS — R2681 Unsteadiness on feet: Secondary | ICD-10-CM | POA: Diagnosis not present

## 2020-03-14 DIAGNOSIS — M545 Low back pain: Secondary | ICD-10-CM | POA: Diagnosis not present

## 2020-03-16 DIAGNOSIS — R2681 Unsteadiness on feet: Secondary | ICD-10-CM | POA: Diagnosis not present

## 2020-03-16 DIAGNOSIS — M545 Low back pain: Secondary | ICD-10-CM | POA: Diagnosis not present

## 2020-03-21 DIAGNOSIS — M545 Low back pain: Secondary | ICD-10-CM | POA: Diagnosis not present

## 2020-03-21 DIAGNOSIS — R2681 Unsteadiness on feet: Secondary | ICD-10-CM | POA: Diagnosis not present

## 2020-03-23 DIAGNOSIS — R2681 Unsteadiness on feet: Secondary | ICD-10-CM | POA: Diagnosis not present

## 2020-03-23 DIAGNOSIS — M545 Low back pain: Secondary | ICD-10-CM | POA: Diagnosis not present

## 2020-03-25 ENCOUNTER — Other Ambulatory Visit: Payer: Self-pay | Admitting: Internal Medicine

## 2020-03-28 DIAGNOSIS — R2681 Unsteadiness on feet: Secondary | ICD-10-CM | POA: Diagnosis not present

## 2020-03-28 DIAGNOSIS — M545 Low back pain: Secondary | ICD-10-CM | POA: Diagnosis not present

## 2020-03-30 DIAGNOSIS — M545 Low back pain: Secondary | ICD-10-CM | POA: Diagnosis not present

## 2020-03-30 DIAGNOSIS — R2681 Unsteadiness on feet: Secondary | ICD-10-CM | POA: Diagnosis not present

## 2020-04-04 DIAGNOSIS — R2681 Unsteadiness on feet: Secondary | ICD-10-CM | POA: Diagnosis not present

## 2020-04-04 DIAGNOSIS — M545 Low back pain: Secondary | ICD-10-CM | POA: Diagnosis not present

## 2020-04-06 ENCOUNTER — Other Ambulatory Visit: Payer: Self-pay | Admitting: *Deleted

## 2020-04-06 DIAGNOSIS — R2681 Unsteadiness on feet: Secondary | ICD-10-CM | POA: Diagnosis not present

## 2020-04-06 DIAGNOSIS — M545 Low back pain: Secondary | ICD-10-CM | POA: Diagnosis not present

## 2020-04-06 DIAGNOSIS — K2100 Gastro-esophageal reflux disease with esophagitis, without bleeding: Secondary | ICD-10-CM

## 2020-04-06 MED ORDER — OMEPRAZOLE 40 MG PO CPDR: 40.0000 mg | DELAYED_RELEASE_CAPSULE | Freq: Two times a day (BID) | ORAL | 6 refills | Status: DC

## 2020-04-09 ENCOUNTER — Other Ambulatory Visit: Payer: Self-pay

## 2020-04-09 ENCOUNTER — Ambulatory Visit (INDEPENDENT_AMBULATORY_CARE_PROVIDER_SITE_OTHER): Payer: PPO | Admitting: Internal Medicine

## 2020-04-09 ENCOUNTER — Encounter: Payer: Self-pay | Admitting: Internal Medicine

## 2020-04-09 VITALS — BP 138/82 | HR 90 | Ht 66.0 in | Wt 156.7 lb

## 2020-04-09 DIAGNOSIS — J449 Chronic obstructive pulmonary disease, unspecified: Secondary | ICD-10-CM | POA: Diagnosis not present

## 2020-04-09 DIAGNOSIS — F339 Major depressive disorder, recurrent, unspecified: Secondary | ICD-10-CM

## 2020-04-09 DIAGNOSIS — K219 Gastro-esophageal reflux disease without esophagitis: Secondary | ICD-10-CM

## 2020-04-09 DIAGNOSIS — M1712 Unilateral primary osteoarthritis, left knee: Secondary | ICD-10-CM | POA: Diagnosis not present

## 2020-04-09 DIAGNOSIS — J45909 Unspecified asthma, uncomplicated: Secondary | ICD-10-CM | POA: Diagnosis not present

## 2020-04-09 DIAGNOSIS — G8929 Other chronic pain: Secondary | ICD-10-CM | POA: Diagnosis not present

## 2020-04-09 DIAGNOSIS — M546 Pain in thoracic spine: Secondary | ICD-10-CM

## 2020-04-09 MED ORDER — DULOXETINE HCL 60 MG PO CPEP: 60.0000 mg | ORAL_CAPSULE | Freq: Two times a day (BID) | ORAL | 3 refills | Status: DC

## 2020-04-09 MED ORDER — MONTELUKAST SODIUM 10 MG PO TABS: 10.0000 mg | ORAL_TABLET | Freq: Every day | ORAL | 6 refills | Status: DC

## 2020-04-09 NOTE — Assessment & Plan Note (Signed)
Asthma is stable.  Patient does not smoke does not drink.

## 2020-04-09 NOTE — Assessment & Plan Note (Signed)
Patient has a history of fall with pelvic fracture she has a problem with the left knee which was injected with cortisone shot on today's visit.  She continues to have pain in the lower back radiating down the both upper thigh.  I advised her to continue doing physical therapy

## 2020-04-09 NOTE — Assessment & Plan Note (Signed)
Chronic COPD is due to asthma it is stable at the present time on physical examination she was not found to have any wheezing or rhonchi.  She was advised to take a flu shot next week.

## 2020-04-09 NOTE — Assessment & Plan Note (Signed)
-   The patient's GERD is stable on medication.  - Instructed the patient to avoid eating spicy and acidic foods, as well as foods high in fat. - Instructed the patient to avoid eating large meals or meals 2-3 hours prior to sleeping. 

## 2020-04-09 NOTE — Progress Notes (Addendum)
Established Patient Office Visit  Subjective:  Patient ID: Teresa Chambers, female    DOB: 1936/01/23  Age: 84 y.o. MRN: [id number]  CC:  Chief Complaint  Patient presents with  . age related osteoporosis    6 week follow up     Knee Pain  The incident occurred more than 1 week ago. The incident occurred in the yard. The injury mechanism was a fall. The pain is present in the left knee. The quality of the pain is described as aching. The pain is at a severity of 4/10. The pain is moderate. Associated symptoms include muscle weakness. Pertinent negatives include no numbness. She has tried elevation for the symptoms.    Teresa Chambers presents for lt knee pain  Past Medical History:  Diagnosis Date  . Actinic keratosis   . Acute thoracic back pain   . Asthma   . Chest pain    unspecified  . CHF (congestive heart failure) ([place]) 04/30/2017  . Chronic abdominal pain 05/01/2017   unspecified  . Congenital heart failure ([place])   . COPD (chronic obstructive pulmonary disease) ([place])   . Disease of upper respiratory system 04/30/2017  . Dyspnea on exertion   . Esophageal reflux disease 04/30/2017  . History of bone density study 06/28/2004  . Leg swelling   . Lumbar arthropathy   . Nasopharyngitis 04/30/2017  . Osteoarthropathy 04/30/2017  . Osteoporosis 04/30/2017  . Palpitations   . Sinusitis, acute 04/30/2017   unspecified  . Squamous cell carcinoma of skin 07/28/2017   Right medial knee. Hypertrophic SCCis. Tx: [facility] 07/28/2017    Past Surgical History:  Procedure Laterality Date  . CATARACT EXTRACTION     07/21/2001 - 07/20/2002  . CHOLECYSTECTOMY     07/21/1997 - 07/20/1998  . COLON SURGERY    . COLONOSCOPY     05/05/2000, 01/17/2000 Adenomatous Polyps   . COLONOSCOPY     11/05/2009, 12/23/2004, 07/07/2001   PH Adenomatous Polyps: CBF 10/2014; recall ltr mailed 09/18/2014 (dw)  . ESOPHAGOGASTRODUODENOSCOPY     11/05/2009, 05/05/2000  . FLEXIBLE SIGMOIDOSCOPY  11/28/1999    . HAND SURGERY  2006  . HERNIA REPAIR    . HIP ARTHROPLASTY Right 02/11/2018   Procedure: ARTHROPLASTY BIPOLAR HIP (HEMIARTHROPLASTY);  Surgeon: Corky Mull, MD;  Location: [facility] ORS;  Service: Orthopedics;  Laterality: Right;  . LAPAROSCOPIC COLON RESECTION     2001    Family History  Problem Relation Age of Onset  . Heart disease Mother   . Heart disease Father   . Hypertension Son   . Hypertension Daughter     Social History   Socioeconomic History  . Marital status: Divorced    Spouse name: Not on file  . Number of children: 5  . Years of education: 15.5  . Highest education level: High school graduate  Occupational History  . Occupation: retired  Tobacco Use  . Smoking status: Never Smoker  . Smokeless tobacco: Never Used  Vaping Use  . Vaping Use: Never used  Substance and Sexual Activity  . Alcohol use: Not Currently  . Drug use: Never  . Sexual activity: Not Currently  Other Topics Concern  . Not on file  Social History Narrative   Lives independently, has home at [facility] she enjoys traveling to   Social Determinants of Health   Financial Resource Strain:   . Difficulty of Paying Living Expenses: Not on file  Food Insecurity:   . Worried About [employer] of  Food in the Last Year: Not on file  . Ran Out of Food in the Last Year: Not on file  Transportation Needs:   . Lack of Transportation (Medical): Not on file  . Lack of Transportation (Non-Medical): Not on file  Physical Activity:   . Days of Exercise per Week: Not on file  . Minutes of Exercise per Session: Not on file  Stress:   . Feeling of Stress : Not on file  Social Connections:   . Frequency of Communication with Friends and Family: Not on file  . Frequency of Social Gatherings with Friends and Family: Not on file  . Attends Religious Services: Not on file  . Active Member of Clubs or Organizations: Not on file  . Attends Archivist Meetings: Not on file  . Marital  Status: Not on file  Intimate Partner Violence:   . Fear of Current or Ex-Partner: Not on file  . Emotionally Abused: Not on file  . Physically Abused: Not on file  . Sexually Abused: Not on file     Current Outpatient Medications:  .  acetaminophen (TYLENOL) 325 MG tablet, Take 1-2 tablets (325-650 mg total) by mouth every 6 (six) hours as needed for mild pain (pain score 1-3 or temp > 100.5)., Disp: , Rfl:  .  albuterol (PROVENTIL HFA;VENTOLIN HFA) 108 (90 Base) MCG/ACT inhaler, Inhale 2 puffs into the lungs every 6 (six) hours as needed for wheezing or shortness of breath., Disp: 18 g, Rfl: 0 .  albuterol (PROVENTIL) (2.5 MG/3ML) 0.083% nebulizer solution, Inhale 3 mLs (2.5 mg total) into the lungs 4 (four) times daily., Disp: 75 mL, Rfl: 0 .  Ascorbic Acid (VITAMIN C WITH ROSE HIPS) 1000 MG tablet, Take 1,000 mg by mouth daily., Disp: , Rfl:  .  Calcium Carbonate-Vitamin D (CALCIUM 600+D) 600-400 MG-UNIT tablet, Take 1 tablet by mouth daily., Disp: , Rfl:  .  diltiazem (DILACOR XR) 120 MG 24 hr capsule, Take 120 mg by mouth daily., Disp: , Rfl:  .  DULoxetine (CYMBALTA) 60 MG capsule, Take 1 capsule (60 mg total) by mouth 2 (two) times daily., Disp: 60 capsule, Rfl: 3 .  ENTRESTO 24-26 MG, Take 1 tablet by mouth 2 (two) times daily., Disp: , Rfl:  .  fluticasone (FLONASE) 50 MCG/ACT nasal spray, Place 2 sprays into both nostrils daily., Disp: 16 g, Rfl: 0 .  fluticasone furoate-vilanterol (BREO ELLIPTA) 200-25 MCG/INH AEPB, Inhale 1 puff into the lungs daily., Disp: , Rfl:  .  guaiFENesin-dextromethorphan (ROBITUSSIN DM) 100-10 MG/5ML syrup, Take 5 mLs by mouth every 6 (six) hours as needed for cough., Disp: 118 mL, Rfl: 0 .  methocarbamol (ROBAXIN) 750 MG tablet, Take 1 tablet (750 mg total) by mouth every 6 (six) hours as needed., Disp: 90 tablet, Rfl: 0 .  montelukast (SINGULAIR) 10 MG tablet, Take 1 tablet (10 mg total) by mouth daily., Disp: 30 tablet, Rfl: 6 .  omeprazole (PRILOSEC)  40 MG capsule, Take 1 capsule (40 mg total) by mouth in the morning and at bedtime., Disp: 60 capsule, Rfl: 6 .  pregabalin (LYRICA) 50 MG capsule, TAKE ONE CAPSULE BY MOUTH TWICE DAILY, Disp: 60 capsule, Rfl: 0 .  simvastatin (ZOCOR) 40 MG tablet, Take 1 tablet (40 mg total) by mouth daily., Disp: 30 tablet, Rfl: 0   Allergies  Allergen Reactions  . Codeine Nausea And Vomiting  . Lidocaine Swelling    ROS Review of Systems  Constitutional: Negative.   HENT: Negative.  Negative for nosebleeds.   Eyes: Negative.   Respiratory: Negative.   Cardiovascular: Negative.   Gastrointestinal: Negative.   Endocrine: Negative.   Genitourinary: Negative.   Musculoskeletal: Positive for arthralgias, back pain, gait problem, joint swelling and myalgias. Negative for neck pain and neck stiffness.  Skin: Negative.   Allergic/Immunologic: Negative.   Neurological: Negative for numbness.  Hematological: Negative.   Psychiatric/Behavioral: Negative.  Negative for confusion, dysphoric mood and hallucinations.  All other systems reviewed and are negative.      Objective:    Physical Exam Vitals reviewed.  Constitutional:      Appearance: Normal appearance.  HENT:     Mouth/Throat:     Mouth: Mucous membranes are moist.  Eyes:     Pupils: Pupils are equal, round, and reactive to light.  Neck:     Vascular: No carotid bruit.  Cardiovascular:     Rate and Rhythm: Normal rate and regular rhythm.     Pulses: Normal pulses.     Heart sounds: Normal heart sounds.  Pulmonary:     Effort: Pulmonary effort is normal.     Breath sounds: Normal breath sounds.  Abdominal:     General: Bowel sounds are normal.     Palpations: Abdomen is soft. There is no hepatomegaly, splenomegaly or mass.     Tenderness: There is no abdominal tenderness.     Hernia: No hernia is present.  Musculoskeletal:        General: No tenderness.     Cervical back: Neck supple.     Right lower leg: No edema.     Left  lower leg: No edema.     Comments: Patient has trouble walking.  She also complains of pain in the lower spine area pain going to the right leg and left leg she is also tender in the pelvic area and the lower back.  Patient was advised to continue current physical therapy.  Skin:    Findings: No rash.  Neurological:     Mental Status: She is alert and oriented to person, place, and time.     Motor: No weakness.  Psychiatric:        Mood and Affect: Mood and affect normal.        Behavior: Behavior normal.      BP 138/82   Pulse 90   Ht 5\' 6"  (1.676 m)   Wt 156 lb 11.2 oz (71.1 kg)   BMI 25.29 kg/m  Wt Readings from Last 3 Encounters:  04/09/20 156 lb 11.2 oz (71.1 kg)  02/27/20 156 lb 9.6 oz (71 kg)  04/15/19 173 lb 6.4 oz (78.7 kg)     Health Maintenance Due  Topic Date Due  . URINE MICROALBUMIN  Never done  . COVID-19 Vaccine (1) Never done  . TETANUS/TDAP  Never done  . PNA vac Low Risk Adult (1 of 2 - PCV13) Never done  . INFLUENZA VACCINE  02/19/2020    There are no preventive care reminders to display for this patient.  Lab Results  Component Value Date   TSH 4.254 02/11/2018   Lab Results  Component Value Date   WBC 7.9 03/29/2019   HGB 13.4 03/29/2019   HCT 41.6 03/29/2019   MCV 100.0 03/29/2019   PLT 261 03/29/2019   Lab Results  Component Value Date   NA 141 03/29/2019   K 4.2 03/29/2019   CO2 28 03/29/2019   GLUCOSE 103 (H) 03/29/2019   BUN 14 03/29/2019  CREATININE 0.72 03/29/2019   BILITOT 0.7 03/29/2019   ALKPHOS 48 03/29/2019   AST 18 03/29/2019   ALT 17 03/29/2019   PROT 6.8 03/29/2019   ALBUMIN 4.0 03/29/2019   CALCIUM 9.2 03/29/2019   ANIONGAP 9 03/29/2019   Lab Results  Component Value Date   CHOL 166 03/29/2019   Lab Results  Component Value Date   HDL 77 03/29/2019   Lab Results  Component Value Date   LDLCALC 73 03/29/2019   Lab Results  Component Value Date   TRIG 82 03/29/2019   Lab Results  Component Value  Date   CHOLHDL 2.2 03/29/2019   No results found for: HGBA1C    Assessment & Plan:   Problem List Items Addressed This Visit      Respiratory   Asthma    Asthma is stable.  Patient does not smoke does not drink.      Relevant Medications   montelukast (SINGULAIR) 10 MG tablet   Chronic obstructive pulmonary disease (HCC) - Primary    Chronic COPD is due to asthma it is stable at the present time on physical examination she was not found to have any wheezing or rhonchi.  She was advised to take a flu shot next week.      Relevant Medications   montelukast (SINGULAIR) 10 MG tablet     Digestive   GERD without esophagitis    - The patient's GERD is stable on medication.  - Instructed the patient to avoid eating spicy and acidic foods, as well as foods high in fat. - Instructed the patient to avoid eating large meals or meals 2-3 hours prior to sleeping.         Other   Chronic bilateral thoracic back pain    Patient has a history of fall with pelvic fracture she has a problem with the left knee which was injected with cortisone shot on today's visit.  She continues to have pain in the lower back radiating down the both upper thigh.  I advised her to continue doing physical therapy      Relevant Medications   DULoxetine (CYMBALTA) 60 MG capsule   Major depression, recurrent, chronic (HCC)    Continue cymbalta      Relevant Medications   DULoxetine (CYMBALTA) 60 MG capsule     No orders of the defined types were placed in this encounter.  Procedure note.  Cortisone injection of the left knee joint.  Left knee was prepared with alcohol and under complete sterile condition 1% lidocaine was injected on the lateral side of the left knee joint.  After that Kenalog 40 mg was injected.  patient tolerated the procedure well and sent to the waiting area in stable condition.    Follow-up: No follow-ups on file.    Cletis Athens, MD

## 2020-04-09 NOTE — Assessment & Plan Note (Signed)
Continue cymbalta  

## 2020-04-12 DIAGNOSIS — M545 Low back pain: Secondary | ICD-10-CM | POA: Diagnosis not present

## 2020-04-12 DIAGNOSIS — R2681 Unsteadiness on feet: Secondary | ICD-10-CM | POA: Diagnosis not present

## 2020-04-13 ENCOUNTER — Other Ambulatory Visit: Payer: Self-pay | Admitting: Internal Medicine

## 2020-04-18 DIAGNOSIS — R2681 Unsteadiness on feet: Secondary | ICD-10-CM | POA: Diagnosis not present

## 2020-04-18 DIAGNOSIS — M545 Low back pain: Secondary | ICD-10-CM | POA: Diagnosis not present

## 2020-04-20 DIAGNOSIS — M545 Low back pain, unspecified: Secondary | ICD-10-CM | POA: Diagnosis not present

## 2020-04-20 DIAGNOSIS — R2681 Unsteadiness on feet: Secondary | ICD-10-CM | POA: Diagnosis not present

## 2020-04-25 DIAGNOSIS — R2681 Unsteadiness on feet: Secondary | ICD-10-CM | POA: Diagnosis not present

## 2020-04-25 DIAGNOSIS — M545 Low back pain, unspecified: Secondary | ICD-10-CM | POA: Diagnosis not present

## 2020-04-27 DIAGNOSIS — R2681 Unsteadiness on feet: Secondary | ICD-10-CM | POA: Diagnosis not present

## 2020-04-27 DIAGNOSIS — M545 Low back pain, unspecified: Secondary | ICD-10-CM | POA: Diagnosis not present

## 2020-05-02 DIAGNOSIS — R2681 Unsteadiness on feet: Secondary | ICD-10-CM | POA: Diagnosis not present

## 2020-05-02 DIAGNOSIS — M545 Low back pain, unspecified: Secondary | ICD-10-CM | POA: Diagnosis not present

## 2020-05-04 DIAGNOSIS — M545 Low back pain, unspecified: Secondary | ICD-10-CM | POA: Diagnosis not present

## 2020-05-04 DIAGNOSIS — R2681 Unsteadiness on feet: Secondary | ICD-10-CM | POA: Diagnosis not present

## 2020-05-07 ENCOUNTER — Ambulatory Visit: Payer: PPO | Admitting: Internal Medicine

## 2020-05-09 DIAGNOSIS — R2681 Unsteadiness on feet: Secondary | ICD-10-CM | POA: Diagnosis not present

## 2020-05-09 DIAGNOSIS — M545 Low back pain, unspecified: Secondary | ICD-10-CM | POA: Diagnosis not present

## 2020-05-11 DIAGNOSIS — M545 Low back pain, unspecified: Secondary | ICD-10-CM | POA: Diagnosis not present

## 2020-05-11 DIAGNOSIS — R2681 Unsteadiness on feet: Secondary | ICD-10-CM | POA: Diagnosis not present

## 2020-05-16 DIAGNOSIS — R2681 Unsteadiness on feet: Secondary | ICD-10-CM | POA: Diagnosis not present

## 2020-05-16 DIAGNOSIS — M545 Low back pain, unspecified: Secondary | ICD-10-CM | POA: Diagnosis not present

## 2020-05-18 DIAGNOSIS — M545 Low back pain, unspecified: Secondary | ICD-10-CM | POA: Diagnosis not present

## 2020-05-18 DIAGNOSIS — R2681 Unsteadiness on feet: Secondary | ICD-10-CM | POA: Diagnosis not present

## 2020-05-23 DIAGNOSIS — M545 Low back pain, unspecified: Secondary | ICD-10-CM | POA: Diagnosis not present

## 2020-05-23 DIAGNOSIS — R2681 Unsteadiness on feet: Secondary | ICD-10-CM | POA: Diagnosis not present

## 2020-05-25 DIAGNOSIS — M545 Low back pain, unspecified: Secondary | ICD-10-CM | POA: Diagnosis not present

## 2020-05-25 DIAGNOSIS — R2681 Unsteadiness on feet: Secondary | ICD-10-CM | POA: Diagnosis not present

## 2020-05-28 ENCOUNTER — Ambulatory Visit (INDEPENDENT_AMBULATORY_CARE_PROVIDER_SITE_OTHER): Payer: PPO | Admitting: Internal Medicine

## 2020-05-28 ENCOUNTER — Other Ambulatory Visit: Payer: Self-pay

## 2020-05-28 ENCOUNTER — Encounter: Payer: Self-pay | Admitting: Internal Medicine

## 2020-05-28 VITALS — BP 110/78 | HR 68 | Ht 65.0 in | Wt 157.2 lb

## 2020-05-28 DIAGNOSIS — K219 Gastro-esophageal reflux disease without esophagitis: Secondary | ICD-10-CM | POA: Diagnosis not present

## 2020-05-28 DIAGNOSIS — J449 Chronic obstructive pulmonary disease, unspecified: Secondary | ICD-10-CM

## 2020-05-28 DIAGNOSIS — I10 Essential (primary) hypertension: Secondary | ICD-10-CM | POA: Insufficient documentation

## 2020-05-28 DIAGNOSIS — L84 Corns and callosities: Secondary | ICD-10-CM

## 2020-05-28 DIAGNOSIS — Z0000 Encounter for general adult medical examination without abnormal findings: Secondary | ICD-10-CM | POA: Diagnosis not present

## 2020-05-28 MED ORDER — DUODERM HYDROACTIVE EX PSTE: PASTE | CUTANEOUS | 0 refills | Status: DC

## 2020-05-28 NOTE — Assessment & Plan Note (Signed)
Patient has a painful callus of the left heel.  I ordered her edema Derm cream to be applied on that.

## 2020-05-28 NOTE — Assessment & Plan Note (Signed)
COPD is stable on the present regimen.  She was not found to have any wheezing she got her flu shot as well as Covid shot.

## 2020-05-28 NOTE — Progress Notes (Signed)
Established Patient Office Visit  Subjective:  Patient ID: Teresa Chambers, female    DOB: January 13, 1936  Age: 84 y.o. MRN: [id number]  CC:  Chief Complaint  Patient presents with  . COPD    HPI  Teresa Chambers presents for patient has a callus of the left heel.  She has a problem with loss of balance and dizziness and using physical therapy for that.  Her neuropathy is bothering her more at night.  Her back pain is getting better.  on physical therapy  Past Medical History:  Diagnosis Date  . Actinic keratosis   . Acute thoracic back pain   . Asthma   . Chest pain    unspecified  . CHF (congestive heart failure) ([place]) 04/30/2017  . Chronic abdominal pain 05/01/2017   unspecified  . Congenital heart failure ([place])   . COPD (chronic obstructive pulmonary disease) ([place])   . Disease of upper respiratory system 04/30/2017  . Dyspnea on exertion   . Esophageal reflux disease 04/30/2017  . History of bone density study 06/28/2004  . Leg swelling   . Lumbar arthropathy   . Nasopharyngitis 04/30/2017  . Osteoarthropathy 04/30/2017  . Osteoporosis 04/30/2017  . Palpitations   . Sinusitis, acute 04/30/2017   unspecified  . Squamous cell carcinoma of skin 07/28/2017   Right medial knee. Hypertrophic SCCis. Tx: [name] 07/28/2017    Past Surgical History:  Procedure Laterality Date  . CATARACT EXTRACTION     07/21/2001 - 07/20/2002  . CHOLECYSTECTOMY     07/21/1997 - 07/20/1998  . COLON SURGERY    . COLONOSCOPY     05/05/2000, 01/17/2000 Adenomatous Polyps   . COLONOSCOPY     11/05/2009, 12/23/2004, 07/07/2001   PH Adenomatous Polyps: CBF 10/2014; recall ltr mailed 09/18/2014 (dw)  . ESOPHAGOGASTRODUODENOSCOPY     11/05/2009, 05/05/2000  . FLEXIBLE SIGMOIDOSCOPY  11/28/1999  . HAND SURGERY  2006  . HERNIA REPAIR    . HIP ARTHROPLASTY Right 02/11/2018   Procedure: ARTHROPLASTY BIPOLAR HIP (HEMIARTHROPLASTY);  Surgeon: Corky Mull, MD;  Location: [facility] ORS;  Service: Orthopedics;   Laterality: Right;  . LAPAROSCOPIC COLON RESECTION     2001    Family History  Problem Relation Age of Onset  . Heart disease Mother   . Heart disease Father   . Hypertension Son   . Hypertension Daughter     Social History   Socioeconomic History  . Marital status: Divorced    Spouse name: Not on file  . Number of children: 5  . Years of education: 15.5  . Highest education level: High school graduate  Occupational History  . Occupation: retired  Tobacco Use  . Smoking status: Never Smoker  . Smokeless tobacco: Never Used  Vaping Use  . Vaping Use: Never used  Substance and Sexual Activity  . Alcohol use: Not Currently  . Drug use: Never  . Sexual activity: Not Currently  Other Topics Concern  . Not on file  Social History Narrative   Lives independently, has home at [name] she enjoys traveling to   Social Determinants of Health   Financial Resource Strain:   . Difficulty of Paying Living Expenses: Not on file  Food Insecurity:   . Worried About [employer] in the Last Year: Not on file  . Ran Out of Food in the Last Year: Not on file  Transportation Needs:   . Lack of Transportation (Medical): Not on file  . Lack of Transportation (  Non-Medical): Not on file  Physical Activity:   . Days of Exercise per Week: Not on file  . Minutes of Exercise per Session: Not on file  Stress:   . Feeling of Stress : Not on file  Social Connections:   . Frequency of Communication with Friends and Family: Not on file  . Frequency of Social Gatherings with Friends and Family: Not on file  . Attends Religious Services: Not on file  . Active Member of Clubs or Organizations: Not on file  . Attends Archivist Meetings: Not on file  . Marital Status: Not on file  Intimate Partner Violence:   . Fear of Current or Ex-Partner: Not on file  . Emotionally Abused: Not on file  . Physically Abused: Not on file  . Sexually Abused: Not on file     Current  Outpatient Medications:  .  acetaminophen (TYLENOL) 325 MG tablet, Take 1-2 tablets (325-650 mg total) by mouth every 6 (six) hours as needed for mild pain (pain score 1-3 or temp > 100.5)., Disp: , Rfl:  .  albuterol (PROVENTIL HFA;VENTOLIN HFA) 108 (90 Base) MCG/ACT inhaler, Inhale 2 puffs into the lungs every 6 (six) hours as needed for wheezing or shortness of breath., Disp: 18 g, Rfl: 0 .  albuterol (PROVENTIL) (2.5 MG/3ML) 0.083% nebulizer solution, Inhale 3 mLs (2.5 mg total) into the lungs 4 (four) times daily., Disp: 75 mL, Rfl: 0 .  Ascorbic Acid (VITAMIN C WITH ROSE HIPS) 1000 MG tablet, Take 1,000 mg by mouth daily., Disp: , Rfl:  .  Calcium Carbonate-Vitamin D (CALCIUM 600+D) 600-400 MG-UNIT tablet, Take 1 tablet by mouth daily., Disp: , Rfl:  .  diltiazem (DILACOR XR) 120 MG 24 hr capsule, Take 120 mg by mouth daily., Disp: , Rfl:  .  DULoxetine (CYMBALTA) 60 MG capsule, Take 1 capsule (60 mg total) by mouth 2 (two) times daily., Disp: 60 capsule, Rfl: 3 .  ENTRESTO 24-26 MG, TAKE 1 TABLET BY MOUTH TWICE DAILY, Disp: 60 tablet, Rfl: 2 .  fluticasone (FLONASE) 50 MCG/ACT nasal spray, Place 2 sprays into both nostrils daily., Disp: 16 g, Rfl: 0 .  fluticasone furoate-vilanterol (BREO ELLIPTA) 200-25 MCG/INH AEPB, Inhale 1 puff into the lungs daily., Disp: , Rfl:  .  guaiFENesin-dextromethorphan (ROBITUSSIN DM) 100-10 MG/5ML syrup, Take 5 mLs by mouth every 6 (six) hours as needed for cough., Disp: 118 mL, Rfl: 0 .  methocarbamol (ROBAXIN) 750 MG tablet, Take 1 tablet (750 mg total) by mouth every 6 (six) hours as needed., Disp: 90 tablet, Rfl: 0 .  montelukast (SINGULAIR) 10 MG tablet, Take 1 tablet (10 mg total) by mouth daily., Disp: 30 tablet, Rfl: 6 .  omeprazole (PRILOSEC) 40 MG capsule, Take 1 capsule (40 mg total) by mouth in the morning and at bedtime., Disp: 60 capsule, Rfl: 6 .  pregabalin (LYRICA) 50 MG capsule, TAKE ONE CAPSULE BY MOUTH TWICE DAILY, Disp: 60 capsule, Rfl: 0 .   simvastatin (ZOCOR) 40 MG tablet, Take 1 tablet (40 mg total) by mouth daily., Disp: 30 tablet, Rfl: 0 .  Hydroactive Dressings (DUODERM HYDROACTIVE) PSTE, DuoDerm  Apply  Once a week   Dispense  1, Disp: 30 g, Rfl: 0   Allergies  Allergen Reactions  . Codeine Nausea And Vomiting  . Lidocaine Swelling    ROS Review of Systems  Constitutional: Negative.   HENT: Negative.   Eyes: Negative.   Respiratory: Negative.   Cardiovascular: Negative.   Gastrointestinal: Negative.  Endocrine: Negative.   Genitourinary: Negative.   Musculoskeletal: Negative.   Skin: Negative.   Allergic/Immunologic: Negative.   Neurological: Negative.   Hematological: Negative.   Psychiatric/Behavioral: Negative.   All other systems reviewed and are negative.     Objective:    Physical Exam Vitals reviewed.  Constitutional:      General: She is not in acute distress.    Appearance: Normal appearance. She is normal weight.  HENT:     Head: Normocephalic.     Nose: Nose normal.     Mouth/Throat:     Mouth: Mucous membranes are moist.  Eyes:     Conjunctiva/sclera: Conjunctivae normal.     Pupils: Pupils are equal, round, and reactive to light.  Neck:     Vascular: No carotid bruit.  Cardiovascular:     Rate and Rhythm: Normal rate and regular rhythm.     Pulses: Normal pulses.     Heart sounds: Normal heart sounds. No murmur heard.   Pulmonary:     Effort: Pulmonary effort is normal. No respiratory distress.     Breath sounds: Normal breath sounds.  Abdominal:     General: Bowel sounds are normal.     Palpations: Abdomen is soft. There is no hepatomegaly, splenomegaly or mass.     Tenderness: There is no abdominal tenderness.     Hernia: No hernia is present.  Musculoskeletal:        General: No tenderness. Normal range of motion.     Cervical back: Normal range of motion and neck supple.     Right lower leg: No edema.     Left lower leg: No edema.  Skin:    Findings: No rash.      Comments: Patient has ulcer in the left  heel  Neurological:     Mental Status: She is alert and oriented to person, place, and time.     Motor: No weakness.     Gait: Gait abnormal.  Psychiatric:        Mood and Affect: Mood and affect normal.        Behavior: Behavior normal.     BP 110/78   Pulse 68   Ht 5\' 5"  (1.651 m)   Wt 157 lb 3.2 oz (71.3 kg)   BMI 26.16 kg/m  Wt Readings from Last 3 Encounters:  05/28/20 157 lb 3.2 oz (71.3 kg)  04/09/20 156 lb 11.2 oz (71.1 kg)  02/27/20 156 lb 9.6 oz (71 kg)     Health Maintenance Due  Topic Date Due  . URINE MICROALBUMIN  Never done  . COVID-19 Vaccine (1) Never done  . TETANUS/TDAP  Never done  . PNA vac Low Risk Adult (1 of 2 - PCV13) Never done  . INFLUENZA VACCINE  02/19/2020    There are no preventive care reminders to display for this patient.  Lab Results  Component Value Date   TSH 4.254 02/11/2018   Lab Results  Component Value Date   WBC 7.9 03/29/2019   HGB 13.4 03/29/2019   HCT 41.6 03/29/2019   MCV 100.0 03/29/2019   PLT 261 03/29/2019   Lab Results  Component Value Date   NA 141 03/29/2019   K 4.2 03/29/2019   CO2 28 03/29/2019   GLUCOSE 103 (H) 03/29/2019   BUN 14 03/29/2019   CREATININE 0.72 03/29/2019   BILITOT 0.7 03/29/2019   ALKPHOS 48 03/29/2019   AST 18 03/29/2019   ALT 17 03/29/2019   PROT 6.8  03/29/2019   ALBUMIN 4.0 03/29/2019   CALCIUM 9.2 03/29/2019   ANIONGAP 9 03/29/2019   Lab Results  Component Value Date   CHOL 166 03/29/2019   Lab Results  Component Value Date   HDL 77 03/29/2019   Lab Results  Component Value Date   LDLCALC 73 03/29/2019   Lab Results  Component Value Date   TRIG 82 03/29/2019   Lab Results  Component Value Date   CHOLHDL 2.2 03/29/2019   No results found for: HGBA1C    Assessment & Plan:   Problem List Items Addressed This Visit      Cardiovascular and Mediastinum   Essential hypertension    Blood pressure 110/78.  She denies  any chest pain complains of shortness of breath on exertion.  Patient takes her medicine regularly.        Respiratory   Chronic obstructive pulmonary disease (HCC)    COPD is stable on the present regimen.  She was not found to have any wheezing she got her flu shot as well as Covid shot.        Digestive   GERD without esophagitis    GERD is stable on the current diet and medications        Musculoskeletal and Integument   Callus of foot - Primary    Patient has a painful callus of the left heel.  I ordered her edema Derm cream to be applied on that.      Relevant Medications   Hydroactive Dressings (DUODERM HYDROACTIVE) PSTE     Other   Annual physical exam    Patient main problem at the present time is neuropathy dizziness low back pain and callus on the left heel which is bothering her.  Patient is getting physical therapy and I advised her to continue that because it is helping her whole lot for the back and the dizziness.  We will treat the callus with a Derm cream.         Meds ordered this encounter  Medications  . Hydroactive Dressings (DUODERM HYDROACTIVE) PSTE    Sig: DuoDerm  Apply  Once a week   Dispense  1    Dispense:  30 g    Refill:  0    Follow-up: No follow-ups on file.    Cletis Athens, MD

## 2020-05-28 NOTE — Assessment & Plan Note (Signed)
Patient main problem at the present time is neuropathy dizziness low back pain and callus on the left heel which is bothering her.  Patient is getting physical therapy and I advised her to continue that because it is helping her whole lot for the back and the dizziness.  We will treat the callus with a Derm cream.

## 2020-05-28 NOTE — Assessment & Plan Note (Signed)
Blood pressure 110/78.  She denies any chest pain complains of shortness of breath on exertion.  Patient takes her medicine regularly.

## 2020-05-28 NOTE — Assessment & Plan Note (Signed)
GERD is stable on the current diet and medications

## 2020-05-29 DIAGNOSIS — R2681 Unsteadiness on feet: Secondary | ICD-10-CM | POA: Diagnosis not present

## 2020-05-29 DIAGNOSIS — M545 Low back pain, unspecified: Secondary | ICD-10-CM | POA: Diagnosis not present

## 2020-05-30 DIAGNOSIS — Z961 Presence of intraocular lens: Secondary | ICD-10-CM | POA: Diagnosis not present

## 2020-06-01 DIAGNOSIS — R2681 Unsteadiness on feet: Secondary | ICD-10-CM | POA: Diagnosis not present

## 2020-06-01 DIAGNOSIS — M545 Low back pain, unspecified: Secondary | ICD-10-CM | POA: Diagnosis not present

## 2020-06-04 ENCOUNTER — Ambulatory Visit (INDEPENDENT_AMBULATORY_CARE_PROVIDER_SITE_OTHER): Payer: PPO | Admitting: Internal Medicine

## 2020-06-04 ENCOUNTER — Other Ambulatory Visit: Payer: Self-pay

## 2020-06-04 DIAGNOSIS — I10 Essential (primary) hypertension: Secondary | ICD-10-CM | POA: Diagnosis not present

## 2020-06-04 DIAGNOSIS — D649 Anemia, unspecified: Secondary | ICD-10-CM | POA: Diagnosis not present

## 2020-06-04 DIAGNOSIS — E785 Hyperlipidemia, unspecified: Secondary | ICD-10-CM

## 2020-06-04 DIAGNOSIS — M8000XS Age-related osteoporosis with current pathological fracture, unspecified site, sequela: Secondary | ICD-10-CM

## 2020-06-05 DIAGNOSIS — R2681 Unsteadiness on feet: Secondary | ICD-10-CM | POA: Diagnosis not present

## 2020-06-05 DIAGNOSIS — M545 Low back pain, unspecified: Secondary | ICD-10-CM | POA: Diagnosis not present

## 2020-06-05 LAB — COMPLETE METABOLIC PANEL WITH GFR
AG Ratio: 1.9 (calc) (ref 1.0–2.5)
ALT: 9 U/L (ref 6–29)
AST: 13 U/L (ref 10–35)
Albumin: 4.1 g/dL (ref 3.6–5.1)
Alkaline phosphatase (APISO): 45 U/L (ref 37–153)
BUN: 14 mg/dL (ref 7–25)
CO2: 28 mmol/L (ref 20–32)
Calcium: 9.7 mg/dL (ref 8.6–10.4)
Chloride: 104 mmol/L (ref 98–110)
Creat: 0.64 mg/dL (ref 0.60–0.88)
GFR, Est African American: 96 mL/min/{1.73_m2} (ref >=60)
GFR, Est Non African American: 83 mL/min/{1.73_m2} (ref >=60)
Globulin: 2.2 g/dL (calc) (ref 1.9–3.7)
Glucose, Bld: 87 mg/dL (ref 65–99)
Potassium: 4.4 mmol/L (ref 3.5–5.3)
Sodium: 145 mmol/L (ref 135–146)
Total Bilirubin: 0.5 mg/dL (ref 0.2–1.2)
Total Protein: 6.3 g/dL (ref 6.1–8.1)

## 2020-06-05 LAB — CBC WITH DIFFERENTIAL/PLATELET
Absolute Monocytes: 501 cells/uL (ref 200–950)
Basophils Absolute: 59 cells/uL (ref 0–200)
Basophils Relative: 0.9 %
Eosinophils Absolute: 143 cells/uL (ref 15–500)
Eosinophils Relative: 2.2 %
HCT: 40.8 % (ref 35.0–45.0)
Hemoglobin: 13.7 g/dL (ref 11.7–15.5)
Lymphs Abs: 1963 cells/uL (ref 850–3900)
MCH: 32.7 pg (ref 27.0–33.0)
MCHC: 33.6 g/dL (ref 32.0–36.0)
MCV: 97.4 fL (ref 80.0–100.0)
MPV: 10.6 fL (ref 7.5–12.5)
Monocytes Relative: 7.7 %
Neutro Abs: 3835 cells/uL (ref 1500–7800)
Neutrophils Relative %: 59 %
Platelets: 271 10*3/uL (ref 140–400)
RBC: 4.19 10*6/uL (ref 3.80–5.10)
RDW: 13 % (ref 11.0–15.0)
Total Lymphocyte: 30.2 %
WBC: 6.5 10*3/uL (ref 3.8–10.8)

## 2020-06-05 LAB — LIPID PANEL
Cholesterol: 246 mg/dL — ABNORMAL HIGH (ref <200)
HDL: 75 mg/dL (ref >=50)
LDL Cholesterol (Calc): 151 mg/dL (calc) — ABNORMAL HIGH
Non-HDL Cholesterol (Calc): 171 mg/dL (calc) — ABNORMAL HIGH (ref <130)
Total CHOL/HDL Ratio: 3.3 (calc) (ref <5.0)
Triglycerides: 96 mg/dL (ref <150)

## 2020-06-05 LAB — TSH: TSH: 4.11 mIU/L (ref 0.40–4.50)

## 2020-06-09 ENCOUNTER — Other Ambulatory Visit: Payer: Self-pay | Admitting: Internal Medicine

## 2020-06-11 ENCOUNTER — Ambulatory Visit (INDEPENDENT_AMBULATORY_CARE_PROVIDER_SITE_OTHER): Payer: PPO | Admitting: Internal Medicine

## 2020-06-11 ENCOUNTER — Other Ambulatory Visit: Payer: Self-pay

## 2020-06-11 ENCOUNTER — Encounter: Payer: Self-pay | Admitting: Internal Medicine

## 2020-06-11 VITALS — BP 117/62 | HR 90 | Ht 65.0 in | Wt 156.6 lb

## 2020-06-11 DIAGNOSIS — E063 Autoimmune thyroiditis: Secondary | ICD-10-CM

## 2020-06-11 DIAGNOSIS — L84 Corns and callosities: Secondary | ICD-10-CM | POA: Diagnosis not present

## 2020-06-11 DIAGNOSIS — E038 Other specified hypothyroidism: Secondary | ICD-10-CM | POA: Diagnosis not present

## 2020-06-11 DIAGNOSIS — E785 Hyperlipidemia, unspecified: Secondary | ICD-10-CM | POA: Diagnosis not present

## 2020-06-11 DIAGNOSIS — I10 Essential (primary) hypertension: Secondary | ICD-10-CM | POA: Diagnosis not present

## 2020-06-11 MED ORDER — EZETIMIBE 10 MG PO TABS: 10.0000 mg | ORAL_TABLET | Freq: Every day | ORAL | 3 refills | Status: DC

## 2020-06-11 MED ORDER — LEVOTHYROXINE SODIUM 50 MCG PO TABS: 50.0000 ug | ORAL_TABLET | Freq: Every day | ORAL | 3 refills | Status: DC

## 2020-06-11 NOTE — Assessment & Plan Note (Signed)
Blood pressure is stable on present medication. 

## 2020-06-11 NOTE — Assessment & Plan Note (Signed)
left foot is getting better on DuoDERM

## 2020-06-11 NOTE — Progress Notes (Signed)
Established Patient Office Visit  Subjective:  Patient ID: Teresa Chambers, female    DOB: October 18, 1935  Age: 84 y.o. MRN: [id number]  CC:  Chief Complaint  Patient presents with  . Lab Results    HPI  Teresa Chambers presents for labs follow up/she has problem with hyperlipidemia.  She is not taking cholesterol medication.  Past Medical History:  Diagnosis Date  . Actinic keratosis   . Acute thoracic back pain   . Asthma   . Chest pain    unspecified  . CHF (congestive heart failure) ([place]) 04/30/2017  . Chronic abdominal pain 05/01/2017   unspecified  . Congenital heart failure ([place])   . COPD (chronic obstructive pulmonary disease) ([place])   . Disease of upper respiratory system 04/30/2017  . Dyspnea on exertion   . Esophageal reflux disease 04/30/2017  . History of bone density study 06/28/2004  . Leg swelling   . Lumbar arthropathy   . Nasopharyngitis 04/30/2017  . Osteoarthropathy 04/30/2017  . Osteoporosis 04/30/2017  . Palpitations   . Sinusitis, acute 04/30/2017   unspecified  . Squamous cell carcinoma of skin 07/28/2017   Right medial knee. Hypertrophic SCCis. Tx: [facility] 07/28/2017    Past Surgical History:  Procedure Laterality Date  . CATARACT EXTRACTION     07/21/2001 - 07/20/2002  . CHOLECYSTECTOMY     07/21/1997 - 07/20/1998  . COLON SURGERY    . COLONOSCOPY     05/05/2000, 01/17/2000 Adenomatous Polyps   . COLONOSCOPY     11/05/2009, 12/23/2004, 07/07/2001   PH Adenomatous Polyps: CBF 10/2014; recall ltr mailed 09/18/2014 ([facility])  . ESOPHAGOGASTRODUODENOSCOPY     11/05/2009, 05/05/2000  . FLEXIBLE SIGMOIDOSCOPY  11/28/1999  . HAND SURGERY  2006  . HERNIA REPAIR    . HIP ARTHROPLASTY Right 02/11/2018   Procedure: ARTHROPLASTY BIPOLAR HIP (HEMIARTHROPLASTY);  Surgeon: Corky Mull, MD;  Location: [facility] ORS;  Service: Orthopedics;  Laterality: Right;  . LAPAROSCOPIC COLON RESECTION     2001    Family History  Problem Relation Age of Onset  . Heart disease  Mother   . Heart disease Father   . Hypertension Son   . Hypertension Daughter     Social History   Socioeconomic History  . Marital status: Divorced    Spouse name: Not on file  . Number of children: 5  . Years of education: 15.5  . Highest education level: High school graduate  Occupational History  . Occupation: retired  Tobacco Use  . Smoking status: Never Smoker  . Smokeless tobacco: Never Used  Vaping Use  . Vaping Use: Never used  Substance and Sexual Activity  . Alcohol use: Not Currently  . Drug use: Never  . Sexual activity: Not Currently  Other Topics Concern  . Not on file  Social History Narrative   Lives independently, has home at [facility] she enjoys traveling to   Social Determinants of Health   Financial Resource Strain:   . Difficulty of Paying Living Expenses: Not on file  Food Insecurity:   . Worried About Charity fundraiser in the Last Year: Not on file  . Ran Out of Food in the Last Year: Not on file  Transportation Needs:   . Lack of Transportation (Medical): Not on file  . Lack of Transportation (Non-Medical): Not on file  Physical Activity:   . Days of Exercise per Week: Not on file  . Minutes of Exercise per Session: Not on file  Stress:   .  Feeling of Stress : Not on file  Social Connections:   . Frequency of Communication with Friends and Family: Not on file  . Frequency of Social Gatherings with Friends and Family: Not on file  . Attends Religious Services: Not on file  . Active Member of Clubs or Organizations: Not on file  . Attends Archivist Meetings: Not on file  . Marital Status: Not on file  Intimate Partner Violence:   . Fear of Current or Ex-Partner: Not on file  . Emotionally Abused: Not on file  . Physically Abused: Not on file  . Sexually Abused: Not on file     Current Outpatient Medications:  .  acetaminophen (TYLENOL) 325 MG tablet, Take 1-2 tablets (325-650 mg total) by mouth every 6 (six) hours as  needed for mild pain (pain score 1-3 or temp > 100.5)., Disp: , Rfl:  .  albuterol (PROVENTIL HFA;VENTOLIN HFA) 108 (90 Base) MCG/ACT inhaler, Inhale 2 puffs into the lungs every 6 (six) hours as needed for wheezing or shortness of breath., Disp: 18 g, Rfl: 0 .  albuterol (PROVENTIL) (2.5 MG/3ML) 0.083% nebulizer solution, Inhale 3 mLs (2.5 mg total) into the lungs 4 (four) times daily., Disp: 75 mL, Rfl: 0 .  Ascorbic Acid (VITAMIN C WITH ROSE HIPS) 1000 MG tablet, Take 1,000 mg by mouth daily., Disp: , Rfl:  .  Calcium Carbonate-Vitamin D (CALCIUM 600+D) 600-400 MG-UNIT tablet, Take 1 tablet by mouth daily., Disp: , Rfl:  .  diltiazem (DILACOR XR) 120 MG 24 hr capsule, Take 120 mg by mouth daily., Disp: , Rfl:  .  DULoxetine (CYMBALTA) 60 MG capsule, Take 1 capsule (60 mg total) by mouth 2 (two) times daily., Disp: 60 capsule, Rfl: 3 .  ENTRESTO 24-26 MG, TAKE 1 TABLET BY MOUTH TWICE DAILY, Disp: 60 tablet, Rfl: 2 .  fluticasone (FLONASE) 50 MCG/ACT nasal spray, Place 2 sprays into both nostrils daily., Disp: 16 g, Rfl: 0 .  fluticasone furoate-vilanterol (BREO ELLIPTA) 200-25 MCG/INH AEPB, Inhale 1 puff into the lungs daily., Disp: , Rfl:  .  guaiFENesin-dextromethorphan (ROBITUSSIN DM) 100-10 MG/5ML syrup, Take 5 mLs by mouth every 6 (six) hours as needed for cough., Disp: 118 mL, Rfl: 0 .  Hydroactive Dressings (DUODERM HYDROACTIVE) PSTE, DuoDerm  Apply  Once a week   Dispense  1, Disp: 30 g, Rfl: 0 .  methocarbamol (ROBAXIN) 750 MG tablet, Take 1 tablet (750 mg total) by mouth every 6 (six) hours as needed., Disp: 90 tablet, Rfl: 0 .  montelukast (SINGULAIR) 10 MG tablet, Take 1 tablet (10 mg total) by mouth daily., Disp: 30 tablet, Rfl: 6 .  omeprazole (PRILOSEC) 40 MG capsule, Take 1 capsule (40 mg total) by mouth in the morning and at bedtime., Disp: 60 capsule, Rfl: 6 .  pregabalin (LYRICA) 50 MG capsule, TAKE ONE CAPSULE BY MOUTH TWICE DAILY, Disp: 60 capsule, Rfl: 0 .  simvastatin (ZOCOR)  40 MG tablet, Take 1 tablet (40 mg total) by mouth daily., Disp: 30 tablet, Rfl: 0 .  ezetimibe (ZETIA) 10 MG tablet, Take 1 tablet (10 mg total) by mouth daily., Disp: 90 tablet, Rfl: 3 .  levothyroxine (SYNTHROID) 50 MCG tablet, Take 1 tablet (50 mcg total) by mouth daily., Disp: 90 tablet, Rfl: 3   Allergies  Allergen Reactions  . Codeine Nausea And Vomiting  . Lidocaine Swelling    ROS Review of Systems  Constitutional: Negative.   HENT: Negative.   Eyes: Negative.   Respiratory: Negative.  Cardiovascular: Negative.   Gastrointestinal: Negative.   Endocrine: Negative.   Genitourinary: Negative.   Musculoskeletal: Positive for arthralgias, back pain, joint swelling and myalgias. Negative for neck stiffness.  Skin: Negative.   Allergic/Immunologic: Negative.   Neurological: Negative.  Negative for seizures and headaches.  Hematological: Negative.   Psychiatric/Behavioral: Negative.  Negative for behavioral problems.  All other systems reviewed and are negative.     Objective:    Physical Exam Vitals reviewed.  Constitutional:      Appearance: Normal appearance.  HENT:     Mouth/Throat:     Mouth: Mucous membranes are moist.  Eyes:     Pupils: Pupils are equal, round, and reactive to light.  Neck:     Vascular: No carotid bruit.  Cardiovascular:     Rate and Rhythm: Normal rate and regular rhythm.     Pulses: Normal pulses.     Heart sounds: Normal heart sounds.  Pulmonary:     Effort: Pulmonary effort is normal.     Breath sounds: Normal breath sounds.  Abdominal:     General: Bowel sounds are normal.     Palpations: Abdomen is soft. There is no hepatomegaly, splenomegaly or mass.     Tenderness: There is no abdominal tenderness.     Hernia: No hernia is present.  Musculoskeletal:        General: No tenderness.     Cervical back: Neck supple.     Right lower leg: No edema.     Left lower leg: No edema.  Skin:    Findings: No rash.  Neurological:      Mental Status: She is alert and oriented to person, place, and time.     Motor: No weakness.  Psychiatric:        Mood and Affect: Mood and affect normal.        Behavior: Behavior normal.     BP 117/62   Pulse 90   Ht 5\' 5"  (1.651 m)   Wt 156 lb 9.6 oz (71 kg)   BMI 26.06 kg/m  Wt Readings from Last 3 Encounters:  06/11/20 156 lb 9.6 oz (71 kg)  05/28/20 157 lb 3.2 oz (71.3 kg)  04/09/20 156 lb 11.2 oz (71.1 kg)     Health Maintenance Due  Topic Date Due  . URINE MICROALBUMIN  Never done  . COVID-19 Vaccine (1) Never done  . TETANUS/TDAP  Never done  . PNA vac Low Risk Adult (1 of 2 - PCV13) Never done  . INFLUENZA VACCINE  02/19/2020    There are no preventive care reminders to display for this patient.  Lab Results  Component Value Date   TSH 4.11 06/04/2020   Lab Results  Component Value Date   WBC 6.5 06/04/2020   HGB 13.7 06/04/2020   HCT 40.8 06/04/2020   MCV 97.4 06/04/2020   PLT 271 06/04/2020   Lab Results  Component Value Date   NA 145 06/04/2020   K 4.4 06/04/2020   CO2 28 06/04/2020   GLUCOSE 87 06/04/2020   BUN 14 06/04/2020   CREATININE 0.64 06/04/2020   BILITOT 0.5 06/04/2020   ALKPHOS 48 03/29/2019   AST 13 06/04/2020   ALT 9 06/04/2020   PROT 6.3 06/04/2020   ALBUMIN 4.0 03/29/2019   CALCIUM 9.7 06/04/2020   ANIONGAP 9 03/29/2019   Lab Results  Component Value Date   CHOL 246 (H) 06/04/2020   Lab Results  Component Value Date   HDL 75 06/04/2020  Lab Results  Component Value Date   LDLCALC 151 (H) 06/04/2020   Lab Results  Component Value Date   TRIG 96 06/04/2020   Lab Results  Component Value Date   CHOLHDL 3.3 06/04/2020   No results found for: HGBA1C    Assessment & Plan:   Problem List Items Addressed This Visit      Cardiovascular and Mediastinum   Essential hypertension    Blood pressure is stable on present medication      Relevant Medications   ezetimibe (ZETIA) 10 MG tablet     Endocrine    Hypothyroidism due to Hashimoto's thyroiditis    I started the patient onlevoxyl      Relevant Medications   levothyroxine (SYNTHROID) 50 MCG tablet     Musculoskeletal and Integument   Callus of foot     left foot is getting better on DuoDERM        Other   Dyslipidemia - Primary    Patient was started on Zetia 10 mg p.o. daily, she cannot tolerate statin.      Relevant Medications   ezetimibe (ZETIA) 10 MG tablet      Meds ordered this encounter  Medications  . ezetimibe (ZETIA) 10 MG tablet    Sig: Take 1 tablet (10 mg total) by mouth daily.    Dispense:  90 tablet    Refill:  3  . levothyroxine (SYNTHROID) 50 MCG tablet    Sig: Take 1 tablet (50 mcg total) by mouth daily.    Dispense:  90 tablet    Refill:  3    Follow-up: No follow-ups on file.    Cletis Athens, MD

## 2020-06-11 NOTE — Assessment & Plan Note (Signed)
Patient was started on Zetia 10 mg p.o. daily, she cannot tolerate statin.

## 2020-06-11 NOTE — Assessment & Plan Note (Signed)
I started the patient onlevoxyl

## 2020-06-19 ENCOUNTER — Other Ambulatory Visit: Payer: Self-pay | Admitting: *Deleted

## 2020-08-14 ENCOUNTER — Other Ambulatory Visit: Payer: Self-pay | Admitting: Internal Medicine

## 2020-08-14 DIAGNOSIS — J309 Allergic rhinitis, unspecified: Secondary | ICD-10-CM

## 2020-09-11 ENCOUNTER — Ambulatory Visit (INDEPENDENT_AMBULATORY_CARE_PROVIDER_SITE_OTHER): Payer: PPO | Admitting: Internal Medicine

## 2020-09-11 ENCOUNTER — Telehealth (INDEPENDENT_AMBULATORY_CARE_PROVIDER_SITE_OTHER): Payer: Self-pay | Admitting: Vascular Surgery

## 2020-09-11 ENCOUNTER — Other Ambulatory Visit: Payer: Self-pay

## 2020-09-11 VITALS — BP 110/65 | HR 98 | Ht 66.0 in | Wt 155.6 lb

## 2020-09-11 DIAGNOSIS — E063 Autoimmune thyroiditis: Secondary | ICD-10-CM

## 2020-09-11 DIAGNOSIS — J45909 Unspecified asthma, uncomplicated: Secondary | ICD-10-CM | POA: Diagnosis not present

## 2020-09-11 DIAGNOSIS — I10 Essential (primary) hypertension: Secondary | ICD-10-CM

## 2020-09-11 DIAGNOSIS — E038 Other specified hypothyroidism: Secondary | ICD-10-CM

## 2020-09-11 DIAGNOSIS — I509 Heart failure, unspecified: Secondary | ICD-10-CM

## 2020-09-11 NOTE — Telephone Encounter (Signed)
Called stating that Dr. Lavera Guise advised that she come in to be seen. Patient has a blue toe and Doctor is worried about circulation. Patient was last seen 08/2018 with abi and le ven reflux studies (JD). Scheduled patient with abi studies for 09/14/2020 with JD.   This note is for documentation purposes only.

## 2020-09-11 NOTE — Progress Notes (Signed)
Established Patient Office Visit  Subjective:  Patient ID: Teresa Chambers, female    DOB: 07/21/1936  Age: 85 y.o. MRN: [id number]  CC:  Chief Complaint  Patient presents with   Follow-up    HPI  Teresa Chambers presents for, dont feel good,   Past Medical History:  Diagnosis Date   Actinic keratosis    Acute thoracic back pain    Asthma    Chest pain    unspecified   CHF (congestive heart failure) ([place]) 04/30/2017   Chronic abdominal pain 05/01/2017   unspecified   Congenital heart failure ([facility])    COPD (chronic obstructive pulmonary disease) ([place])    Disease of upper respiratory system 04/30/2017   Dyspnea on exertion    Esophageal reflux disease 04/30/2017   History of bone density study 06/28/2004   Leg swelling    Lumbar arthropathy    Nasopharyngitis 04/30/2017   Osteoarthropathy 04/30/2017   Osteoporosis 04/30/2017   Palpitations    Sinusitis, acute 04/30/2017   unspecified   Squamous cell carcinoma of skin 07/28/2017   Right medial knee. Hypertrophic SCCis. Tx: [facility] 07/28/2017    Past Surgical History:  Procedure Laterality Date   CATARACT EXTRACTION     07/21/2001 - 07/20/2002   CHOLECYSTECTOMY     07/21/1997 - 07/20/1998   COLON SURGERY     COLONOSCOPY     05/05/2000, 01/17/2000 Adenomatous Polyps    COLONOSCOPY     11/05/2009, 12/23/2004, 07/07/2001   PH Adenomatous Polyps: CBF 10/2014; recall ltr mailed 09/18/2014 ([facility])   ESOPHAGOGASTRODUODENOSCOPY     11/05/2009, 05/05/2000   FLEXIBLE SIGMOIDOSCOPY  11/28/1999   HAND SURGERY  2006   HERNIA REPAIR     HIP ARTHROPLASTY Right 02/11/2018   Procedure: ARTHROPLASTY BIPOLAR HIP (HEMIARTHROPLASTY);  Surgeon: Corky Mull, MD;  Location: [facility] ORS;  Service: Orthopedics;  Laterality: Right;   LAPAROSCOPIC COLON RESECTION     2001    Family History  Problem Relation Age of Onset   Heart disease Mother    Heart disease Father    Hypertension Son    Hypertension Daughter      Social History   Socioeconomic History   Marital status: Divorced    Spouse name: Not on file   Number of children: 5   Years of education: 15.5   Highest education level: High school graduate  Occupational History   Occupation: retired  Tobacco Use   Smoking status: Never Smoker   Smokeless tobacco: Never Used  Scientific laboratory technician Use: Never used  Substance and Sexual Activity   Alcohol use: Not Currently   Drug use: Never   Sexual activity: Not Currently  Other Topics Concern   Not on file  Social History Narrative   Lives independently, has [facility] at [facility] she enjoys traveling to   Social Determinants of Radio broadcast assistant Strain: Not on file  Food Insecurity: Not on file  Transportation Needs: Not on file  Physical Activity: Not on file  Stress: Not on file  Social Connections: Not on file  Intimate Partner Violence: Not on file     Current Outpatient Medications:    acetaminophen (TYLENOL) 325 MG tablet, Take 1-2 tablets (325-650 mg total) by mouth every 6 (six) hours as needed for mild pain (pain score 1-3 or temp > 100.5)., Disp: , Rfl:    albuterol (PROVENTIL HFA;VENTOLIN HFA) 108 (90 Base) MCG/ACT inhaler, Inhale 2 puffs into the lungs every 6 (six)  hours as needed for wheezing or shortness of breath., Disp: 18 g, Rfl: 0   albuterol (PROVENTIL) (2.5 MG/3ML) 0.083% nebulizer solution, Inhale 3 mLs (2.5 mg total) into the lungs 4 (four) times daily., Disp: 75 mL, Rfl: 0   Ascorbic Acid (VITAMIN C WITH ROSE HIPS) 1000 MG tablet, Take 1,000 mg by mouth daily., Disp: , Rfl:    Calcium Carbonate-Vitamin D (CALCIUM 600+D) 600-400 MG-UNIT tablet, Take 1 tablet by mouth daily., Disp: , Rfl:    diltiazem (DILACOR XR) 120 MG 24 hr capsule, Take 120 mg by mouth daily., Disp: , Rfl:    DULoxetine (CYMBALTA) 60 MG capsule, Take 1 capsule (60 mg total) by mouth 2 (two) times daily., Disp: 60 capsule, Rfl: 3   ENTRESTO 24-26 MG, TAKE 1  TABLET BY MOUTH TWICE DAILY, Disp: 60 tablet, Rfl: 2   ezetimibe (ZETIA) 10 MG tablet, Take 1 tablet (10 mg total) by mouth daily., Disp: 90 tablet, Rfl: 3   fluticasone (FLONASE) 50 MCG/ACT nasal spray, SHAKE LIQUID AND USE 2 SPRAYS IN EACH NOSTRIL DAILY, Disp: 48 g, Rfl: 6   fluticasone furoate-vilanterol (BREO ELLIPTA) 200-25 MCG/INH AEPB, Inhale 1 puff into the lungs daily., Disp: , Rfl:    guaiFENesin-dextromethorphan (ROBITUSSIN DM) 100-10 MG/5ML syrup, Take 5 mLs by mouth every 6 (six) hours as needed for cough., Disp: 118 mL, Rfl: 0   Hydroactive Dressings (DUODERM HYDROACTIVE) PSTE, DuoDerm  Apply  Once a week   Dispense  1, Disp: 30 g, Rfl: 0   levothyroxine (SYNTHROID) 50 MCG tablet, Take 1 tablet (50 mcg total) by mouth daily., Disp: 90 tablet, Rfl: 3   methocarbamol (ROBAXIN) 750 MG tablet, Take 1 tablet (750 mg total) by mouth every 6 (six) hours as needed., Disp: 90 tablet, Rfl: 0   montelukast (SINGULAIR) 10 MG tablet, Take 1 tablet (10 mg total) by mouth daily., Disp: 30 tablet, Rfl: 6   omeprazole (PRILOSEC) 40 MG capsule, Take 1 capsule (40 mg total) by mouth in the morning and at bedtime., Disp: 60 capsule, Rfl: 6   pregabalin (LYRICA) 50 MG capsule, TAKE ONE CAPSULE BY MOUTH TWICE DAILY, Disp: 60 capsule, Rfl: 0   simvastatin (ZOCOR) 40 MG tablet, Take 1 tablet (40 mg total) by mouth daily., Disp: 30 tablet, Rfl: 0   Allergies  Allergen Reactions   Codeine Nausea And Vomiting   Lidocaine Swelling    ROS Review of Systems  Constitutional: Positive for fatigue. Negative for appetite change, chills, diaphoresis and fever.  HENT: Negative.  Negative for ear pain.   Eyes: Negative.   Respiratory: Positive for shortness of breath. Negative for cough, choking and chest tightness.   Cardiovascular: Negative.  Negative for chest pain.  Gastrointestinal: Negative.  Negative for abdominal pain.  Endocrine: Negative.  Negative for polydipsia.  Genitourinary:  Negative.  Negative for dysuria and enuresis.  Musculoskeletal: Negative.   Skin: Negative.   Allergic/Immunologic: Negative.   Neurological: Negative.   Hematological: Negative.   Psychiatric/Behavioral: Negative.   All other systems reviewed and are negative.     Objective:    Physical Exam Vitals reviewed.  Constitutional:      Appearance: Normal appearance.  HENT:     Mouth/Throat:     Mouth: Mucous membranes are moist.  Eyes:     Pupils: Pupils are equal, round, and reactive to light.  Neck:     Vascular: No carotid bruit.  Cardiovascular:     Rate and Rhythm: Normal rate and regular rhythm.  Pulses: Normal pulses.     Heart sounds: Normal heart sounds.  Pulmonary:     Effort: Pulmonary effort is normal.     Breath sounds: Normal breath sounds.  Abdominal:     General: Bowel sounds are normal.     Palpations: Abdomen is soft. There is no hepatomegaly, splenomegaly or mass.     Tenderness: There is no abdominal tenderness.     Hernia: No hernia is present.  Musculoskeletal:        General: No tenderness.     Cervical back: Neck supple.     Right lower leg: No edema.     Left lower leg: No edema.  Skin:    Findings: No rash.  Neurological:     Mental Status: She is alert and oriented to person, place, and time.     Motor: No weakness.  Psychiatric:        Mood and Affect: Mood and affect normal.        Behavior: Behavior normal.     BP 110/65    Pulse 98    Ht 5\' 6"  (1.676 m)    Wt 155 lb 9.6 oz (70.6 kg)    SpO2 95%    BMI 25.11 kg/m  Wt Readings from Last 3 Encounters:  09/12/20 155 lb 9.6 oz (70.6 kg)  06/11/20 156 lb 9.6 oz (71 kg)  05/28/20 157 lb 3.2 oz (71.3 kg)     Health Maintenance Due  Topic Date Due   COVID-19 Vaccine (1) Never done   TETANUS/TDAP  Never done   PNA vac Low Risk Adult (1 of 2 - PCV13) Never done   INFLUENZA VACCINE  02/19/2020    There are no preventive care reminders to display for this patient.  Lab Results   Component Value Date   TSH 4.11 06/04/2020   Lab Results  Component Value Date   WBC 6.5 06/04/2020   HGB 13.7 06/04/2020   HCT 40.8 06/04/2020   MCV 97.4 06/04/2020   PLT 271 06/04/2020   Lab Results  Component Value Date   NA 145 06/04/2020   K 4.4 06/04/2020   CO2 28 06/04/2020   GLUCOSE 87 06/04/2020   BUN 14 06/04/2020   CREATININE 0.64 06/04/2020   BILITOT 0.5 06/04/2020   ALKPHOS 48 03/29/2019   AST 13 06/04/2020   ALT 9 06/04/2020   PROT 6.3 06/04/2020   ALBUMIN 4.0 03/29/2019   CALCIUM 9.7 06/04/2020   ANIONGAP 9 03/29/2019   Lab Results  Component Value Date   CHOL 246 (H) 06/04/2020   Lab Results  Component Value Date   HDL 75 06/04/2020   Lab Results  Component Value Date   LDLCALC 151 (H) 06/04/2020   Lab Results  Component Value Date   TRIG 96 06/04/2020   Lab Results  Component Value Date   CHOLHDL 3.3 06/04/2020   No results found for: HGBA1C    Assessment & Plan:   Problem List Items Addressed This Visit      Cardiovascular and Mediastinum   CHF (congestive heart failure) ([place])    Patient has a congestive heart failure she is taking Entresto and her blood pressure is stable on that.  She was not found to have any rales few rhonchi were noted because of her asthma.  There is no swelling of the legs.      Essential hypertension - Primary    Blood pressure is stable on the present medication.  She denies chest  pain complains of shortness of breath on exertion.  I advised her to follow low-salt diet.  And walk a little bit every day.  She has also been advised to go to the podiatrist and also go to the vascular surgeon for her legs.  She has a neuropathy of the both legs.  And has been taking Cymbalta for that.        Respiratory   Asthma    I told the patient to use rescue inhaler for wheezing.  And especially if you can walk a little bit more than normal.        Endocrine   Hypothyroidism due to Hashimoto's thyroiditis     Patient thyroid function tests are stable on her Levoxyl.         No orders of the defined types were placed in this encounter.   Follow-up: No follow-ups on file.    Cletis Athens, MD

## 2020-09-12 ENCOUNTER — Other Ambulatory Visit (INDEPENDENT_AMBULATORY_CARE_PROVIDER_SITE_OTHER): Payer: Self-pay | Admitting: Vascular Surgery

## 2020-09-12 ENCOUNTER — Encounter: Payer: Self-pay | Admitting: Internal Medicine

## 2020-09-12 DIAGNOSIS — R230 Cyanosis: Secondary | ICD-10-CM

## 2020-09-13 ENCOUNTER — Encounter: Payer: Self-pay | Admitting: Internal Medicine

## 2020-09-13 NOTE — Assessment & Plan Note (Signed)
I told the patient to use rescue inhaler for wheezing.  And especially if you can walk a little bit more than normal.

## 2020-09-13 NOTE — Assessment & Plan Note (Signed)
Patient thyroid function tests are stable on her Levoxyl.

## 2020-09-13 NOTE — Assessment & Plan Note (Signed)
Patient has a congestive heart failure she is taking Entresto and her blood pressure is stable on that.  She was not found to have any rales few rhonchi were noted because of her asthma.  There is no swelling of the legs.

## 2020-09-13 NOTE — Assessment & Plan Note (Signed)
Blood pressure is stable on the present medication.  She denies chest pain complains of shortness of breath on exertion.  I advised her to follow low-salt diet.  And walk a little bit every day.  She has also been advised to go to the podiatrist and also go to the vascular surgeon for her legs.  She has a neuropathy of the both legs.  And has been taking Cymbalta for that.

## 2020-09-14 ENCOUNTER — Encounter (INDEPENDENT_AMBULATORY_CARE_PROVIDER_SITE_OTHER): Payer: Self-pay | Admitting: Nurse Practitioner

## 2020-09-14 ENCOUNTER — Other Ambulatory Visit: Payer: Self-pay

## 2020-09-14 ENCOUNTER — Ambulatory Visit (INDEPENDENT_AMBULATORY_CARE_PROVIDER_SITE_OTHER): Payer: PPO

## 2020-09-14 ENCOUNTER — Ambulatory Visit (INDEPENDENT_AMBULATORY_CARE_PROVIDER_SITE_OTHER): Payer: PPO | Admitting: Nurse Practitioner

## 2020-09-14 VITALS — BP 127/72 | HR 97 | Resp 16 | Wt 155.0 lb

## 2020-09-14 DIAGNOSIS — L819 Disorder of pigmentation, unspecified: Secondary | ICD-10-CM | POA: Diagnosis not present

## 2020-09-14 DIAGNOSIS — R230 Cyanosis: Secondary | ICD-10-CM

## 2020-09-14 DIAGNOSIS — I872 Venous insufficiency (chronic) (peripheral): Secondary | ICD-10-CM | POA: Diagnosis not present

## 2020-09-14 DIAGNOSIS — I5022 Chronic systolic (congestive) heart failure: Secondary | ICD-10-CM

## 2020-09-14 DIAGNOSIS — I10 Essential (primary) hypertension: Secondary | ICD-10-CM

## 2020-09-14 MED ORDER — CLOPIDOGREL BISULFATE 75 MG PO TABS: 75.0000 mg | ORAL_TABLET | Freq: Every day | ORAL | 3 refills | Status: DC

## 2020-09-14 NOTE — Progress Notes (Signed)
Subjective:    Patient ID: Teresa Chambers, female    DOB: 15-Jul-1936, 85 y.o.   MRN: [id number] Chief Complaint  Patient presents with  . Follow-up    Teresa Chambers is an 85 year old female that presents today due to toe discoloration.  The patient has a discolored right fourth toe.  Discoloration began sporadically and sometimes worsens with elevation.  The patient denies any severe pain in the toe.  There is no open wounds.  Patient denies any trauma to the area.  No fever or chills.  Today noninvasive studies show an ABI of 1.25 on the right and 1.26 on the left.  The patient has a TBI of 0.68 in the right and 0.92 on the left.  The patient has strong triphasic tibial artery waveforms bilaterally.  The patient has good toe waveforms in the left lower extremity with slightly dampened on the right.   Review of Systems     Objective:   Physical Exam  BP 127/72 (BP Location: Right Arm)   Pulse 97   Resp 16   Wt 155 lb (70.3 kg)   BMI 25.02 kg/m   Past Medical History:  Diagnosis Date  . Actinic keratosis   . Acute thoracic back pain   . Asthma   . Chest pain    unspecified  . CHF (congestive heart failure) ([place]) 04/30/2017  . Chronic abdominal pain 05/01/2017   unspecified  . Congenital heart failure ([place])   . COPD (chronic obstructive pulmonary disease) ([place])   . Disease of upper respiratory system 04/30/2017  . Dyspnea on exertion   . Esophageal reflux disease 04/30/2017  . History of bone density study 06/28/2004  . Leg swelling   . Lumbar arthropathy   . Nasopharyngitis 04/30/2017  . Osteoarthropathy 04/30/2017  . Osteoporosis 04/30/2017  . Palpitations   . Sinusitis, acute 04/30/2017   unspecified  . Squamous cell carcinoma of skin 07/28/2017   Right medial knee. Hypertrophic SCCis. Tx: [facility] 07/28/2017    Social History   Socioeconomic History  . Marital status: Divorced    Spouse name: Not on file  . Number of children: 5  . Years of education: 15.5  .  Highest education level: High school graduate  Occupational History  . Occupation: retired  Tobacco Use  . Smoking status: Never Smoker  . Smokeless tobacco: Never Used  Vaping Use  . Vaping Use: Never used  Substance and Sexual Activity  . Alcohol use: Not Currently  . Drug use: Never  . Sexual activity: Not Currently  Other Topics Concern  . Not on file  Social History Narrative   Lives independently, has home at [facility] she enjoys traveling to   Social Determinants of Health   Financial Resource Strain: Not on file  Food Insecurity: Not on file  Transportation Needs: Not on file  Physical Activity: Not on file  Stress: Not on file  Social Connections: Not on file  Intimate Partner Violence: Not on file    Past Surgical History:  Procedure Laterality Date  . CATARACT EXTRACTION     07/21/2001 - 07/20/2002  . CHOLECYSTECTOMY     07/21/1997 - 07/20/1998  . COLON SURGERY    . COLONOSCOPY     05/05/2000, 01/17/2000 Adenomatous Polyps   . COLONOSCOPY     11/05/2009, 12/23/2004, 07/07/2001   PH Adenomatous Polyps: CBF 10/2014; recall ltr mailed 09/18/2014 (dw)  . ESOPHAGOGASTRODUODENOSCOPY     11/05/2009, 05/05/2000  . FLEXIBLE SIGMOIDOSCOPY  11/28/1999  .  HAND SURGERY  2006  . HERNIA REPAIR    . HIP ARTHROPLASTY Right 02/11/2018   Procedure: ARTHROPLASTY BIPOLAR HIP (HEMIARTHROPLASTY);  Surgeon: Corky Mull, MD;  Location: [facility] ORS;  Service: Orthopedics;  Laterality: Right;  . LAPAROSCOPIC COLON RESECTION     2001    Family History  Problem Relation Age of Onset  . Heart disease Mother   . Heart disease Father   . Hypertension Son   . Hypertension Daughter     Allergies  Allergen Reactions  . Codeine Nausea And Vomiting  . Lidocaine Swelling and Anaphylaxis    CBC Latest Ref Rng & Units 06/04/2020 03/29/2019 02/12/2018  WBC 3.8 - 10.8 Thousand/uL 6.5 7.9 10.9  Hemoglobin 11.7 - 15.5 g/dL 13.7 13.4 12.2  Hematocrit 35.0 - 45.0 % 40.8 41.6 34.5(L)   Platelets 140 - 400 Thousand/uL 271 261 207      CMP     Component Value Date/Time   NA 145 06/04/2020 1055   K 4.4 06/04/2020 1055   CL 104 06/04/2020 1055   CO2 28 06/04/2020 1055   GLUCOSE 87 06/04/2020 1055   BUN 14 06/04/2020 1055   CREATININE 0.64 06/04/2020 1055   CALCIUM 9.7 06/04/2020 1055   PROT 6.3 06/04/2020 1055   ALBUMIN 4.0 03/29/2019 0951   AST 13 06/04/2020 1055   ALT 9 06/04/2020 1055   ALKPHOS 48 03/29/2019 0951   BILITOT 0.5 06/04/2020 1055   GFRNONAA 83 06/04/2020 1055   GFRAA 96 06/04/2020 1055     VAS [place] ABI WITH/WO TBI  Result Date: 09/14/2020 LOWER EXTREMITY DOPPLER STUDY Indications: Rest pain, and Blue Toes.  Comparison Study: 09/06/2018 Performing Technologist: Almira Coaster RVS  Examination Guidelines: A complete evaluation includes at minimum, Doppler waveform signals and systolic blood pressure reading at the level of bilateral brachial, anterior tibial, and posterior tibial arteries, when vessel segments are accessible. Bilateral testing is considered an integral part of a complete examination. Photoelectric Plethysmograph (PPG) waveforms and toe systolic pressure readings are included as required and additional duplex testing as needed. Limited examinations for reoccurring indications may be performed as noted.  ABI Findings: +---------+------------------+-----+--------+--------+ Right    Rt Pressure (mmHg)IndexWaveformComment  +---------+------------------+-----+--------+--------+ Brachial 130                                     +---------+------------------+-----+--------+--------+ ATA      162               1.25                  +---------+------------------+-----+--------+--------+ PTA      159               1.22                  +---------+------------------+-----+--------+--------+ Great Toe88                0.68 Abnormal         +---------+------------------+-----+--------+--------+  +---------+------------------+-----+--------+-------+ Left     Lt Pressure (mmHg)IndexWaveformComment +---------+------------------+-----+--------+-------+ Brachial 128                                    +---------+------------------+-----+--------+-------+ ATA      152               1.17                 +---------+------------------+-----+--------+-------+  PTA      164               1.26                 +---------+------------------+-----+--------+-------+ Great Toe120               0.92 Normal          +---------+------------------+-----+--------+-------+ +-------+-----------+-----------+------------+------------+ ABI/TBIToday's ABIToday's TBIPrevious ABIPrevious TBI +-------+-----------+-----------+------------+------------+ Right  1.25       .68        1.30        .83          +-------+-----------+-----------+------------+------------+ Left   1.26       .92        1.19        1.22         +-------+-----------+-----------+------------+------------+ Bilateral ABIs appear essentially unchanged compared to prior study on 09/06/2018. Bilateral TBIs appear decreased compared to prior study on 09/06/2018.  Summary: Right: Resting right ankle-brachial index is within normal range. No evidence of significant right lower extremity arterial disease. The right toe-brachial index is abnormal. Left: Resting left ankle-brachial index is within normal range. No evidence of significant left lower extremity arterial disease. The left toe-brachial index is normal.  *See table(s) above for measurements and observations.  Electronically signed by Leotis Pain MD on 09/14/2020 at 11:57:31 AM.    Final        Assessment & Plan:   1. Discoloration of skin of toe Based on noninvasive studies today the patient does have slightly dampened waveforms in the right lower extremity.  Overall the patient has very good arterial circulation.  The patient may have microvascular changes causing  discoloration combined with the chronic venous insufficiency.  We will place the patient on Plavix and have her return in 3 months to repeat noninvasive studies.  The patient is also advised that if the toe discoloration worsens or ulceration formed she should contact her office for  2. Chronic venous insufficiency Patient does have some chronic venous insufficiency this may also attribute to the slight discoloration.  3. Essential hypertension Continue antihypertensive medications as already ordered, these medications have been reviewed and there are no changes at this time.   4. Chronic systolic (congestive) heart failure (HCC) Entresto is proven very helpful for the patient however due to systolic heart failure that may account for the temperature changes in her lower extremities.   Current Outpatient Medications on File Prior to Visit  Medication Sig Dispense Refill  . acetaminophen (TYLENOL) 325 MG tablet Take 1-2 tablets (325-650 mg total) by mouth every 6 (six) hours as needed for mild pain (pain score 1-3 or temp > 100.5).    Marland Kitchen albuterol (PROVENTIL HFA;VENTOLIN HFA) 108 (90 Base) MCG/ACT inhaler Inhale 2 puffs into the lungs every 6 (six) hours as needed for wheezing or shortness of breath. 18 g 0  . albuterol (PROVENTIL) (2.5 MG/3ML) 0.083% nebulizer solution Inhale 3 mLs (2.5 mg total) into the lungs 4 (four) times daily. 75 mL 0  . Ascorbic Acid (VITAMIN C WITH ROSE HIPS) 1000 MG tablet Take 1,000 mg by mouth daily.    . Calcium Carbonate-Vitamin D 600-400 MG-UNIT tablet Take 1 tablet by mouth daily.    Marland Kitchen diltiazem (DILACOR XR) 120 MG 24 hr capsule Take 120 mg by mouth daily.    . DULoxetine (CYMBALTA) 60 MG capsule Take 1 capsule (60 mg total) by mouth 2 (two) times daily. 60 capsule 3  .  ENTRESTO 24-26 MG TAKE 1 TABLET BY MOUTH TWICE DAILY 60 tablet 2  . ezetimibe (ZETIA) 10 MG tablet Take 1 tablet (10 mg total) by mouth daily. 90 tablet 3  . fluticasone (FLONASE) 50 MCG/ACT  nasal spray SHAKE LIQUID AND USE 2 SPRAYS IN EACH NOSTRIL DAILY 48 g 6  . fluticasone furoate-vilanterol (BREO ELLIPTA) 200-25 MCG/INH AEPB Inhale 1 puff into the lungs daily.    Marland Kitchen guaiFENesin-dextromethorphan (ROBITUSSIN DM) 100-10 MG/5ML syrup Take 5 mLs by mouth every 6 (six) hours as needed for cough. 118 mL 0  . Hydroactive Dressings (DUODERM HYDROACTIVE) PSTE DuoDerm  Apply  Once a week   Dispense  1 30 g 0  . levothyroxine (SYNTHROID) 50 MCG tablet Take 1 tablet (50 mcg total) by mouth daily. 90 tablet 3  . methocarbamol (ROBAXIN) 750 MG tablet Take 1 tablet (750 mg total) by mouth every 6 (six) hours as needed. 90 tablet 0  . montelukast (SINGULAIR) 10 MG tablet Take 1 tablet (10 mg total) by mouth daily. 30 tablet 6  . omeprazole (PRILOSEC) 40 MG capsule Take 1 capsule (40 mg total) by mouth in the morning and at bedtime. 60 capsule 6  . pregabalin (LYRICA) 50 MG capsule TAKE ONE CAPSULE BY MOUTH TWICE DAILY 60 capsule 0  . simvastatin (ZOCOR) 40 MG tablet Take 1 tablet (40 mg total) by mouth daily. (Patient not taking: Reported on 09/14/2020) 30 tablet 0   No current facility-administered medications on file prior to visit.    There are no Patient Instructions on file for this visit. No follow-ups on file.   Kris Hartmann, NP

## 2020-09-17 ENCOUNTER — Other Ambulatory Visit: Payer: Self-pay

## 2020-09-17 ENCOUNTER — Other Ambulatory Visit (INDEPENDENT_AMBULATORY_CARE_PROVIDER_SITE_OTHER): Payer: Self-pay | Admitting: Nurse Practitioner

## 2020-09-17 MED ORDER — ENTRESTO 24-26 MG PO TABS
1.0000 | ORAL_TABLET | Freq: Two times a day (BID) | ORAL | 1 refills | Status: DC
Start: 2020-09-17 — End: 2021-04-01

## 2020-09-17 MED ORDER — PANTOPRAZOLE SODIUM 40 MG PO TBEC: 40.0000 mg | DELAYED_RELEASE_TABLET | Freq: Every day | ORAL | 3 refills | Status: DC

## 2020-09-26 ENCOUNTER — Other Ambulatory Visit: Payer: Self-pay | Admitting: Internal Medicine

## 2020-09-26 DIAGNOSIS — F339 Major depressive disorder, recurrent, unspecified: Secondary | ICD-10-CM

## 2020-10-15 ENCOUNTER — Other Ambulatory Visit: Payer: Self-pay | Admitting: *Deleted

## 2020-10-15 MED ORDER — BREO ELLIPTA 200-25 MCG/INH IN AEPB: 1.0000 | INHALATION_SPRAY | Freq: Every day | RESPIRATORY_TRACT | 6 refills | Status: DC

## 2020-10-16 ENCOUNTER — Other Ambulatory Visit: Payer: Self-pay

## 2020-10-16 MED ORDER — BREO ELLIPTA 200-25 MCG/INH IN AEPB: 1.0000 | INHALATION_SPRAY | Freq: Every day | RESPIRATORY_TRACT | 3 refills | Status: AC

## 2020-10-29 ENCOUNTER — Other Ambulatory Visit: Payer: Self-pay | Admitting: Internal Medicine

## 2020-10-29 ENCOUNTER — Other Ambulatory Visit: Payer: Self-pay | Admitting: *Deleted

## 2020-10-29 DIAGNOSIS — J45909 Unspecified asthma, uncomplicated: Secondary | ICD-10-CM

## 2020-10-29 MED ORDER — MONTELUKAST SODIUM 10 MG PO TABS: 10.0000 mg | ORAL_TABLET | Freq: Every day | ORAL | 3 refills | Status: DC

## 2020-10-30 ENCOUNTER — Other Ambulatory Visit: Payer: Self-pay

## 2020-10-30 ENCOUNTER — Ambulatory Visit (INDEPENDENT_AMBULATORY_CARE_PROVIDER_SITE_OTHER): Payer: PPO | Admitting: Internal Medicine

## 2020-10-30 VITALS — BP 97/56 | HR 96 | Ht 65.0 in | Wt 156.6 lb

## 2020-10-30 DIAGNOSIS — I10 Essential (primary) hypertension: Secondary | ICD-10-CM

## 2020-10-30 DIAGNOSIS — K219 Gastro-esophageal reflux disease without esophagitis: Secondary | ICD-10-CM

## 2020-10-30 DIAGNOSIS — M1990 Unspecified osteoarthritis, unspecified site: Secondary | ICD-10-CM | POA: Diagnosis not present

## 2020-10-30 DIAGNOSIS — I872 Venous insufficiency (chronic) (peripheral): Secondary | ICD-10-CM | POA: Diagnosis not present

## 2020-10-30 DIAGNOSIS — I5082 Biventricular heart failure: Secondary | ICD-10-CM

## 2020-11-02 ENCOUNTER — Encounter: Payer: Self-pay | Admitting: Internal Medicine

## 2020-11-02 NOTE — Assessment & Plan Note (Signed)
Stable at the present time. 

## 2020-11-02 NOTE — Assessment & Plan Note (Signed)
Refer to orthopedics.  Advised to take vitamin D 1000 units p.o. daily

## 2020-11-02 NOTE — Progress Notes (Signed)
Established Patient Office Visit  Subjective:  Patient ID: Teresa Chambers, female    DOB: 02-02-1936  Age: 85 y.o. MRN: [id number]  CC:  Chief Complaint  Patient presents with  . Follow-up    Pt here for follow up for hypertension     HPI  Teresa Chambers presents for   General checkup.  Patient is complaining of pain in the left knee he has- The patient's blood sugar is under control on med.,  Because of the arthritis will be referred to the vascular specialist . she complains of pain in the left leg and weakness also has Dupuytren contractures on the both hands.  She is also known to have hypothyroidism chronic back pain and claudication of the legs.  Past Medical History:  Diagnosis Date  . Actinic keratosis   . Acute thoracic back pain   . Asthma   . Chest pain    unspecified  . CHF (congestive heart failure) ([place]) 04/30/2017  . Chronic abdominal pain 05/01/2017   unspecified  . Congenital heart failure ([place])   . COPD (chronic obstructive pulmonary disease) ([place])   . Disease of upper respiratory system 04/30/2017  . Dyspnea on exertion   . Esophageal reflux disease 04/30/2017  . History of bone density study 06/28/2004  . Leg swelling   . Lumbar arthropathy   . Nasopharyngitis 04/30/2017  . Osteoarthropathy 04/30/2017  . Osteoporosis 04/30/2017  . Palpitations   . Sinusitis, acute 04/30/2017   unspecified  . Squamous cell carcinoma of skin 07/28/2017   Right medial knee. Hypertrophic SCCis. Tx: [facility] 07/28/2017    Past Surgical History:  Procedure Laterality Date  . CATARACT EXTRACTION     07/21/2001 - 07/20/2002  . CHOLECYSTECTOMY     07/21/1997 - 07/20/1998  . COLON SURGERY    . COLONOSCOPY     05/05/2000, 01/17/2000 Adenomatous Polyps   . COLONOSCOPY     11/05/2009, 12/23/2004, 07/07/2001   PH Adenomatous Polyps: CBF 10/2014; recall ltr mailed 09/18/2014 (dw)  . ESOPHAGOGASTRODUODENOSCOPY     11/05/2009, 05/05/2000  . FLEXIBLE SIGMOIDOSCOPY  11/28/1999  .  HAND SURGERY  2006  . HERNIA REPAIR    . HIP ARTHROPLASTY Right 02/11/2018   Procedure: ARTHROPLASTY BIPOLAR HIP (HEMIARTHROPLASTY);  Surgeon: Corky Mull, MD;  Location: [facility] ORS;  Service: Orthopedics;  Laterality: Right;  . LAPAROSCOPIC COLON RESECTION     2001   Congestive heart failure is stable.  She has a history of colonoscopy Family History  Problem Relation Age of Onset  . Heart disease Mother   . Heart disease Father   . Hypertension Son   . Hypertension Daughter     Social History   Socioeconomic History  . Marital status: Divorced    Spouse name: Not on file  . Number of children: 5  . Years of education: 15.5  . Highest education level: High school graduate  Occupational History  . Occupation: retired  Tobacco Use  . Smoking status: Never Smoker  . Smokeless tobacco: Never Used  Vaping Use  . Vaping Use: Never used  Substance and Sexual Activity  . Alcohol use: Not Currently  . Drug use: Never  . Sexual activity: Not Currently  Other Topics Concern  . Not on file  Social History Narrative   Lives independently, has home at [facility] she enjoys traveling to   Social Determinants of Health   Financial Resource Strain: Not on file  Food Insecurity: Not on file  Transportation Needs:  Not on file  Physical Activity: Not on file  Stress: Not on file  Social Connections: Not on file  Intimate Partner Violence: Not on file     Current Outpatient Medications:  .  acetaminophen (TYLENOL) 325 MG tablet, Take 1-2 tablets (325-650 mg total) by mouth every 6 (six) hours as needed for mild pain (pain score 1-3 or temp > 100.5)., Disp: , Rfl:  .  albuterol (PROVENTIL HFA;VENTOLIN HFA) 108 (90 Base) MCG/ACT inhaler, Inhale 2 puffs into the lungs every 6 (six) hours as needed for wheezing or shortness of breath., Disp: 18 g, Rfl: 0 .  albuterol (PROVENTIL) (2.5 MG/3ML) 0.083% nebulizer solution, Inhale 3 mLs (2.5 mg total) into the lungs 4 (four) times daily.,  Disp: 75 mL, Rfl: 0 .  Ascorbic Acid (VITAMIN C WITH ROSE HIPS) 1000 MG tablet, Take 1,000 mg by mouth daily., Disp: , Rfl:  .  Calcium Carbonate-Vitamin D 600-400 MG-UNIT tablet, Take 1 tablet by mouth daily., Disp: , Rfl:  .  clopidogrel (PLAVIX) 75 MG tablet, Take 1 tablet (75 mg total) by mouth daily., Disp: 30 tablet, Rfl: 3 .  diltiazem (DILACOR XR) 120 MG 24 hr capsule, Take 120 mg by mouth daily., Disp: , Rfl:  .  DULoxetine (CYMBALTA) 60 MG capsule, TAKE 1 CAPSULE BY MOUTH TWICE DAILY, Disp: 60 capsule, Rfl: 3 .  DULoxetine (CYMBALTA) 60 MG capsule, TAKE 1 CAPSULE BY MOUTH TWICE DAILY, Disp: 60 capsule, Rfl: 3 .  ezetimibe (ZETIA) 10 MG tablet, Take 1 tablet (10 mg total) by mouth daily., Disp: 90 tablet, Rfl: 3 .  fluticasone (FLONASE) 50 MCG/ACT nasal spray, SHAKE LIQUID AND USE 2 SPRAYS IN EACH NOSTRIL DAILY, Disp: 48 g, Rfl: 6 .  fluticasone furoate-vilanterol (BREO ELLIPTA) 200-25 MCG/INH AEPB, Inhale 1 puff into the lungs daily., Disp: 3 each, Rfl: 3 .  guaiFENesin-dextromethorphan (ROBITUSSIN DM) 100-10 MG/5ML syrup, Take 5 mLs by mouth every 6 (six) hours as needed for cough., Disp: 118 mL, Rfl: 0 .  Hydroactive Dressings (DUODERM HYDROACTIVE) PSTE, DuoDerm  Apply  Once a week   Dispense  1, Disp: 30 g, Rfl: 0 .  levothyroxine (SYNTHROID) 50 MCG tablet, Take 1 tablet (50 mcg total) by mouth daily., Disp: 90 tablet, Rfl: 3 .  methocarbamol (ROBAXIN) 750 MG tablet, Take 1 tablet (750 mg total) by mouth every 6 (six) hours as needed., Disp: 90 tablet, Rfl: 0 .  montelukast (SINGULAIR) 10 MG tablet, Take 1 tablet (10 mg total) by mouth daily at 2 PM., Disp: 90 tablet, Rfl: 3 .  pantoprazole (PROTONIX) 40 MG tablet, Take 1 tablet (40 mg total) by mouth daily., Disp: 30 tablet, Rfl: 3 .  pregabalin (LYRICA) 50 MG capsule, TAKE ONE CAPSULE BY MOUTH TWICE DAILY, Disp: 60 capsule, Rfl: 0 .  sacubitril-valsartan (ENTRESTO) 24-26 MG, Take 1 tablet by mouth 2 (two) times daily., Disp: 180  tablet, Rfl: 1 .  simvastatin (ZOCOR) 40 MG tablet, Take 1 tablet (40 mg total) by mouth daily. (Patient not taking: Reported on 09/14/2020), Disp: 30 tablet, Rfl: 0   Allergies  Allergen Reactions  . Codeine Nausea And Vomiting  . Lidocaine Swelling and Anaphylaxis    ROS Review of Systems  Constitutional: Negative for chills.  HENT: Negative for ear discharge.   Eyes: Negative for pain.  Respiratory: Negative.   Cardiovascular: Negative.  Negative for leg swelling.  Neurological: Negative.   Hematological: Negative.   Psychiatric/Behavioral: Negative.       Objective:  Physical Exam Vitals reviewed.  Constitutional:      Appearance: Normal appearance.  HENT:     Mouth/Throat:     Mouth: Mucous membranes are moist.  Eyes:     Pupils: Pupils are equal, round, and reactive to light.  Neck:     Vascular: No carotid bruit.  Cardiovascular:     Rate and Rhythm: Normal rate and regular rhythm.     Heart sounds: Normal heart sounds.  Pulmonary:     Effort: Pulmonary effort is normal.     Breath sounds: Normal breath sounds.  Abdominal:     General: Bowel sounds are normal.     Palpations: Abdomen is soft. There is no hepatomegaly, splenomegaly or mass.     Tenderness: There is no abdominal tenderness.     Hernia: No hernia is present.  Musculoskeletal:        General: No tenderness.     Cervical back: Neck supple.     Right lower leg: No edema.     Left lower leg: No edema.  Skin:    Findings: No rash.  Neurological:     Mental Status: She is alert and oriented to person, place, and time.     Motor: No weakness.  Psychiatric:        Mood and Affect: Mood and affect normal.        Behavior: Behavior normal.    Complains of pain in the both legs.  Straight leg raising test is positive, ischemic both feet BP (!) 97/56   Pulse 96   Ht 5\' 5"  (1.651 m)   Wt 156 lb 9.6 oz (71 kg)   BMI 26.06 kg/m  Wt Readings from Last 3 Encounters:  10/30/20 156 lb 9.6 oz (71  kg)  09/14/20 155 lb (70.3 kg)  09/12/20 155 lb 9.6 oz (70.6 kg)     Health Maintenance Due  Topic Date Due  . COVID-19 Vaccine (1) Never done  . TETANUS/TDAP  Never done  . PNA vac Low Risk Adult (1 of 2 - PCV13) Never done    There are no preventive care reminders to display for this patient.  Lab Results  Component Value Date   TSH 4.11 06/04/2020   Lab Results  Component Value Date   WBC 6.5 06/04/2020   HGB 13.7 06/04/2020   HCT 40.8 06/04/2020   MCV 97.4 06/04/2020   PLT 271 06/04/2020   Lab Results  Component Value Date   NA 145 06/04/2020   K 4.4 06/04/2020   CO2 28 06/04/2020   GLUCOSE 87 06/04/2020   BUN 14 06/04/2020   CREATININE 0.64 06/04/2020   BILITOT 0.5 06/04/2020   ALKPHOS 48 03/29/2019   AST 13 06/04/2020   ALT 9 06/04/2020   PROT 6.3 06/04/2020   ALBUMIN 4.0 03/29/2019   CALCIUM 9.7 06/04/2020   ANIONGAP 9 03/29/2019   Lab Results  Component Value Date   CHOL 246 (H) 06/04/2020   Lab Results  Component Value Date   HDL 75 06/04/2020   Lab Results  Component Value Date   LDLCALC 151 (H) 06/04/2020   Lab Results  Component Value Date   TRIG 96 06/04/2020   Lab Results  Component Value Date   CHOLHDL 3.3 06/04/2020   No results found for: HGBA1C    Assessment & Plan:  TSH is normal cholesterol is high.  Patient was advised to take Delene Loll only once a day. Problem List Items Addressed This Visit  Cardiovascular and Mediastinum   CHF (congestive heart failure) (HCC)    Stable at the present time.      Chronic venous insufficiency    Referred to the vascular surgeon      Essential hypertension - Primary    Patient blood pressure is normal patient denies any chest pain or shortness of breath there is no history of palpitation paroxysmal nocturnal dyspnea patient can walk  25yards without any problem patient was advised to follow low-salt low-cholesterol diet  I reviewed the results of Sprint trial  ideally I want to  keep systolic blood pressure below 130 mmHg, patient was asked to check blood pressure 3 times a week and give me a report on that.  Patient will be follow-up in 3 months, patient will call me back for any change in the cardiovascular symptoms           Digestive   GERD without esophagitis    - The patient's GERD is stable on medication.  - Instructed the patient to avoid eating spicy and acidic foods, as well as foods high in fat. - Instructed the patient to avoid eating large meals or meals 2-3 hours prior to sleeping.        Musculoskeletal and Integument   Osteoarthropathy    Refer to orthopedics.  Advised to take vitamin D 1000 units p.o. daily         No orders of the defined types were placed in this encounter. Patient has Dupuytren contractures of the both hands, referred to the bone specialist  Follow-up: No follow-ups on file.    Cletis Athens, MD

## 2020-11-02 NOTE — Assessment & Plan Note (Signed)
-   The patient's GERD is stable on medication.  - Instructed the patient to avoid eating spicy and acidic foods, as well as foods high in fat. - Instructed the patient to avoid eating large meals or meals 2-3 hours prior to sleeping. 

## 2020-11-02 NOTE — Assessment & Plan Note (Signed)

## 2020-11-02 NOTE — Assessment & Plan Note (Signed)
Referred to the vascular surgeon

## 2020-11-06 ENCOUNTER — Ambulatory Visit: Payer: PPO | Admitting: Internal Medicine

## 2020-12-04 ENCOUNTER — Other Ambulatory Visit (INDEPENDENT_AMBULATORY_CARE_PROVIDER_SITE_OTHER): Payer: Self-pay | Admitting: Nurse Practitioner

## 2020-12-04 DIAGNOSIS — L819 Disorder of pigmentation, unspecified: Secondary | ICD-10-CM

## 2020-12-07 ENCOUNTER — Ambulatory Visit (INDEPENDENT_AMBULATORY_CARE_PROVIDER_SITE_OTHER): Payer: PPO | Admitting: Vascular Surgery

## 2020-12-07 ENCOUNTER — Other Ambulatory Visit: Payer: Self-pay

## 2020-12-07 ENCOUNTER — Ambulatory Visit (INDEPENDENT_AMBULATORY_CARE_PROVIDER_SITE_OTHER): Payer: PPO

## 2020-12-07 ENCOUNTER — Ambulatory Visit (INDEPENDENT_AMBULATORY_CARE_PROVIDER_SITE_OTHER): Payer: PPO | Admitting: Nurse Practitioner

## 2020-12-07 ENCOUNTER — Encounter (INDEPENDENT_AMBULATORY_CARE_PROVIDER_SITE_OTHER): Payer: Self-pay | Admitting: Nurse Practitioner

## 2020-12-07 ENCOUNTER — Other Ambulatory Visit (INDEPENDENT_AMBULATORY_CARE_PROVIDER_SITE_OTHER): Payer: Self-pay | Admitting: Nurse Practitioner

## 2020-12-07 VITALS — BP 144/78 | HR 87 | Ht 66.0 in | Wt 153.0 lb

## 2020-12-07 DIAGNOSIS — L819 Disorder of pigmentation, unspecified: Secondary | ICD-10-CM

## 2020-12-07 DIAGNOSIS — E785 Hyperlipidemia, unspecified: Secondary | ICD-10-CM | POA: Diagnosis not present

## 2020-12-07 DIAGNOSIS — I10 Essential (primary) hypertension: Secondary | ICD-10-CM | POA: Diagnosis not present

## 2020-12-08 ENCOUNTER — Encounter (INDEPENDENT_AMBULATORY_CARE_PROVIDER_SITE_OTHER): Payer: Self-pay | Admitting: Nurse Practitioner

## 2020-12-08 NOTE — Progress Notes (Signed)
Subjective:    Patient ID: Teresa Chambers, female    DOB: 02-16-1936, 84 y.o.   MRN: [id number] Chief Complaint  Patient presents with  . Follow-up    3 Mo follow  up    Teresa Chambers is an 85 year old female that presents today due to toe discoloration.  The patient's right fourth toe was discolored.  She was recently started on Plavix and this has improve the discoloration.  She notes that it is not as deeply discolored.  She still continues to deny any pain.  She continues to deny any wounds or ulcerations.  Today noninvasive studies show an ABI 1.37 on the right and 1.30 on the left.  The patient has a TBI 0.72 on the right and 1.16 on the left.  Today we also did toe tracings on all 5 toes.  On the right foot the great toe was dampened with all other digits having normal waveforms.  We warmed the toes for 2 minutes then following warming the great toe was only mildly dampened and the other digits remained normal.  On the left foot the fourth digit was dampened whereas the rest had normal waveforms.  After warming all digit waveforms were normal.   Review of Systems  Skin: Positive for color change.  All other systems reviewed and are negative.      Objective:   Physical Exam Vitals reviewed.  HENT:     Head: Normocephalic.  Cardiovascular:     Rate and Rhythm: Normal rate.     Pulses: Normal pulses.  Pulmonary:     Effort: Pulmonary effort is normal.  Skin:    General: Skin is warm and dry.     Comments: Fourth toe on right foot slightly discolored  Neurological:     Mental Status: She is alert and oriented to person, place, and time.  Psychiatric:        Mood and Affect: Mood normal.        Behavior: Behavior normal.        Thought Content: Thought content normal.        Judgment: Judgment normal.     BP (!) 144/78   Pulse 87   Ht 5\' 6"  (1.676 m)   Wt 153 lb (69.4 kg)   BMI 24.69 kg/m   Past Medical History:  Diagnosis Date  . Actinic keratosis   . Acute  thoracic back pain   . Asthma   . Chest pain    unspecified  . CHF (congestive heart failure) ([place]) 04/30/2017  . Chronic abdominal pain 05/01/2017   unspecified  . Congenital heart failure ([place])   . COPD (chronic obstructive pulmonary disease) ([place])   . Disease of upper respiratory system 04/30/2017  . Dyspnea on exertion   . Esophageal reflux disease 04/30/2017  . History of bone density study 06/28/2004  . Leg swelling   . Lumbar arthropathy   . Nasopharyngitis 04/30/2017  . Osteoarthropathy 04/30/2017  . Osteoporosis 04/30/2017  . Palpitations   . Sinusitis, acute 04/30/2017   unspecified  . Squamous cell carcinoma of skin 07/28/2017   Right medial knee. Hypertrophic SCCis. Tx: [facility] 07/28/2017    Social History   Socioeconomic History  . Marital status: Divorced    Spouse name: Not on file  . Number of children: 5  . Years of education: 15.5  . Highest education level: High school graduate  Occupational History  . Occupation: retired  Tobacco Use  . Smoking status: Never Smoker  .  Smokeless tobacco: Never Used  Vaping Use  . Vaping Use: Never used  Substance and Sexual Activity  . Alcohol use: Not Currently  . Drug use: Never  . Sexual activity: Not Currently  Other Topics Concern  . Not on file  Social History Narrative   Lives independently, has home at [facility] she enjoys traveling to   Social Determinants of Health   Financial Resource Strain: Not on file  Food Insecurity: Not on file  Transportation Needs: Not on file  Physical Activity: Not on file  Stress: Not on file  Social Connections: Not on file  Intimate Partner Violence: Not on file    Past Surgical History:  Procedure Laterality Date  . CATARACT EXTRACTION     07/21/2001 - 07/20/2002  . CHOLECYSTECTOMY     07/21/1997 - 07/20/1998  . COLON SURGERY    . COLONOSCOPY     05/05/2000, 01/17/2000 Adenomatous Polyps   . COLONOSCOPY     11/05/2009, 12/23/2004, 07/07/2001   PH Adenomatous  Polyps: CBF 10/2014; recall ltr mailed 09/18/2014 (dw)  . ESOPHAGOGASTRODUODENOSCOPY     11/05/2009, 05/05/2000  . FLEXIBLE SIGMOIDOSCOPY  11/28/1999  . HAND SURGERY  2006  . HERNIA REPAIR    . HIP ARTHROPLASTY Right 02/11/2018   Procedure: ARTHROPLASTY BIPOLAR HIP (HEMIARTHROPLASTY);  Surgeon: Corky Mull, MD;  Location: [facility] ORS;  Service: Orthopedics;  Laterality: Right;  . LAPAROSCOPIC COLON RESECTION     2001    Family History  Problem Relation Age of Onset  . Heart disease Mother   . Heart disease Father   . Hypertension Son   . Hypertension Daughter     Allergies  Allergen Reactions  . Codeine Nausea And Vomiting  . Lidocaine Swelling and Anaphylaxis    CBC Latest Ref Rng & Units 06/04/2020 03/29/2019 02/12/2018  WBC 3.8 - 10.8 Thousand/uL 6.5 7.9 10.9  Hemoglobin 11.7 - 15.5 g/dL 13.7 13.4 12.2  Hematocrit 35.0 - 45.0 % 40.8 41.6 34.5(L)  Platelets 140 - 400 Thousand/uL 271 261 207      CMP     Component Value Date/Time   NA 145 06/04/2020 1055   K 4.4 06/04/2020 1055   CL 104 06/04/2020 1055   CO2 28 06/04/2020 1055   GLUCOSE 87 06/04/2020 1055   BUN 14 06/04/2020 1055   CREATININE 0.64 06/04/2020 1055   CALCIUM 9.7 06/04/2020 1055   PROT 6.3 06/04/2020 1055   ALBUMIN 4.0 03/29/2019 0951   AST 13 06/04/2020 1055   ALT 9 06/04/2020 1055   ALKPHOS 48 03/29/2019 0951   BILITOT 0.5 06/04/2020 1055   GFRNONAA 83 06/04/2020 1055   GFRAA 96 06/04/2020 1055     No results found.     Assessment & Plan:   1. Discoloration of skin of toe Based on the noninvasive studies today it is possible patient's toe discoloration is related to Raynaud's disease.  In either case the patient is doing well while on Plavix.  The discoloration has improved.  The patient is instructed to continue with Plavix.  The patient should also utilize warm socks when in colder temperatures as well.  2. Essential hypertension Continue antihypertensive medications as already ordered,  these medications have been reviewed and there are no changes at this time.   3. Dyslipidemia Continue statin as ordered and reviewed, no changes at this time    Current Outpatient Medications on File Prior to Visit  Medication Sig Dispense Refill  . acetaminophen (TYLENOL) 325 MG tablet Take  1-2 tablets (325-650 mg total) by mouth every 6 (six) hours as needed for mild pain (pain score 1-3 or temp > 100.5).    Marland Kitchen albuterol (PROVENTIL HFA;VENTOLIN HFA) 108 (90 Base) MCG/ACT inhaler Inhale 2 puffs into the lungs every 6 (six) hours as needed for wheezing or shortness of breath. 18 g 0  . albuterol (PROVENTIL) (2.5 MG/3ML) 0.083% nebulizer solution Inhale 3 mLs (2.5 mg total) into the lungs 4 (four) times daily. 75 mL 0  . Ascorbic Acid (VITAMIN C WITH ROSE HIPS) 1000 MG tablet Take 1,000 mg by mouth daily.    . Calcium Carbonate-Vitamin D 600-400 MG-UNIT tablet Take 1 tablet by mouth daily.    . clopidogrel (PLAVIX) 75 MG tablet Take 1 tablet (75 mg total) by mouth daily. 30 tablet 3  . diltiazem (DILACOR XR) 120 MG 24 hr capsule Take 120 mg by mouth daily.    . DULoxetine (CYMBALTA) 60 MG capsule TAKE 1 CAPSULE BY MOUTH TWICE DAILY 60 capsule 3  . DULoxetine (CYMBALTA) 60 MG capsule TAKE 1 CAPSULE BY MOUTH TWICE DAILY 60 capsule 3  . ezetimibe (ZETIA) 10 MG tablet Take 1 tablet (10 mg total) by mouth daily. 90 tablet 3  . fluticasone (FLONASE) 50 MCG/ACT nasal spray SHAKE LIQUID AND USE 2 SPRAYS IN EACH NOSTRIL DAILY 48 g 6  . fluticasone furoate-vilanterol (BREO ELLIPTA) 200-25 MCG/INH AEPB Inhale 1 puff into the lungs daily. 3 each 3  . guaiFENesin-dextromethorphan (ROBITUSSIN DM) 100-10 MG/5ML syrup Take 5 mLs by mouth every 6 (six) hours as needed for cough. 118 mL 0  . Hydroactive Dressings (DUODERM HYDROACTIVE) PSTE DuoDerm  Apply  Once a week   Dispense  1 30 g 0  . levothyroxine (SYNTHROID) 50 MCG tablet Take 1 tablet (50 mcg total) by mouth daily. 90 tablet 3  . methocarbamol  (ROBAXIN) 750 MG tablet Take 1 tablet (750 mg total) by mouth every 6 (six) hours as needed. 90 tablet 0  . montelukast (SINGULAIR) 10 MG tablet Take 1 tablet (10 mg total) by mouth daily at 2 PM. 90 tablet 3  . pantoprazole (PROTONIX) 40 MG tablet Take 1 tablet (40 mg total) by mouth daily. 30 tablet 3  . pregabalin (LYRICA) 50 MG capsule TAKE ONE CAPSULE BY MOUTH TWICE DAILY 60 capsule 0  . sacubitril-valsartan (ENTRESTO) 24-26 MG Take 1 tablet by mouth 2 (two) times daily. 180 tablet 1  . simvastatin (ZOCOR) 40 MG tablet Take 1 tablet (40 mg total) by mouth daily. 30 tablet 0   No current facility-administered medications on file prior to visit.    There are no Patient Instructions on file for this visit. No follow-ups on file.   Kris Hartmann, NP

## 2020-12-10 ENCOUNTER — Other Ambulatory Visit (INDEPENDENT_AMBULATORY_CARE_PROVIDER_SITE_OTHER): Payer: Self-pay | Admitting: Nurse Practitioner

## 2020-12-20 ENCOUNTER — Encounter: Payer: Self-pay | Admitting: Dermatology

## 2020-12-20 ENCOUNTER — Other Ambulatory Visit: Payer: Self-pay

## 2020-12-20 ENCOUNTER — Ambulatory Visit: Payer: PPO | Admitting: Dermatology

## 2020-12-20 DIAGNOSIS — Z85828 Personal history of other malignant neoplasm of skin: Secondary | ICD-10-CM

## 2020-12-20 DIAGNOSIS — D692 Other nonthrombocytopenic purpura: Secondary | ICD-10-CM

## 2020-12-20 DIAGNOSIS — L578 Other skin changes due to chronic exposure to nonionizing radiation: Secondary | ICD-10-CM

## 2020-12-20 DIAGNOSIS — L820 Inflamed seborrheic keratosis: Secondary | ICD-10-CM | POA: Diagnosis not present

## 2020-12-20 DIAGNOSIS — L570 Actinic keratosis: Secondary | ICD-10-CM

## 2020-12-20 DIAGNOSIS — L821 Other seborrheic keratosis: Secondary | ICD-10-CM

## 2020-12-20 NOTE — Patient Instructions (Addendum)

## 2020-12-20 NOTE — Progress Notes (Signed)
Follow-Up Visit   Subjective  Teresa Chambers is a 85 y.o. female who presents for the following: Skin Problem (Pt c/o growth growing on her neck, legs and face, ) and Follow-up (Hx of SCC right knee, Hx of BCC on the nose).  The following portions of the chart were reviewed this encounter and updated as appropriate:   Tobacco  Allergies  Meds  Problems  Med Hx  Surg Hx  Fam Hx     Review of Systems:  No other skin or systemic complaints except as noted in HPI or Assessment and Plan.  Objective  Well appearing patient in no apparent distress; mood and affect are within normal limits.  A focused examination was performed including legs . Relevant physical exam findings are noted in the Assessment and Plan.  Objective  Right leg, left leg, right posterior base of neck, right bicep (5): Erythematous keratotic or waxy stuck-on papule or plaque.   Objective  left nose: Erythematous thin papules/macules with gritty scale.    Assessment & Plan  Inflamed seborrheic keratosis (5) Right leg, left leg, right posterior base of neck, right bicep  Destruction of lesion - Right leg, left leg, right posterior base of neck, right bicep Complexity: simple   Destruction method: cryotherapy   Informed consent: discussed and consent obtained   Timeout:  patient name, date of birth, surgical site, and procedure verified Lesion destroyed using liquid nitrogen: Yes   Region frozen until ice ball extended beyond lesion: Yes   Outcome: patient tolerated procedure well with no complications   Post-procedure details: wound care instructions given    AK (actinic keratosis) left nose  Destruction of lesion - left nose Complexity: simple   Destruction method: cryotherapy   Informed consent: discussed and consent obtained   Timeout:  patient name, date of birth, surgical site, and procedure verified Lesion destroyed using liquid nitrogen: Yes   Region frozen until ice ball extended beyond  lesion: Yes   Outcome: patient tolerated procedure well with no complications   Post-procedure details: wound care instructions given     Actinic Damage - chronic, secondary to cumulative UV radiation exposure/sun exposure over time - diffuse scaly erythematous macules with underlying dyspigmentation - Recommend daily broad spectrum sunscreen SPF 30+ to sun-exposed areas, reapply every 2 hours as needed.  - Recommend staying in the shade or wearing long sleeves, sun glasses (UVA+UVB protection) and wide brim hats (4-inch brim around the entire circumference of the hat). - Call for new or changing lesions.  Seborrheic Keratoses - Stuck-on, waxy, tan-brown papules and/or plaques  - Benign-appearing - Discussed benign etiology and prognosis. - Observe - Call for any changes  History of Squamous Cell Carcinoma of the Skin Right knee  - No evidence of recurrence today - No lymphadenopathy - Recommend regular full body skin exams - Recommend daily broad spectrum sunscreen SPF 30+ to sun-exposed areas, reapply every 2 hours as needed.  - Call if any new or changing lesions are noted between office visits  Purpura - Chronic; persistent and recurrent.  Treatable, but not curable. - Violaceous macules and patches - Benign - Related to trauma, age, sun damage and/or use of blood thinners, chronic use of topical and/or oral steroids - Observe - Can use OTC arnica containing moisturizer such as Dermend Bruise Formula if desired - Call for worsening or other concerns  History of Basal Cell Carcinoma of the Skin Nose  - No evidence of recurrence today - Recommend regular full  body skin exams - Recommend daily broad spectrum sunscreen SPF 30+ to sun-exposed areas, reapply every 2 hours as needed.  - Call if any new or changing lesions are noted between office visits  Return in about 7 months (around 07/22/2021) for TBSE .  IMarye Round, CMA, am acting as scribe for Sarina Ser, MD  .  Documentation: I have reviewed the above documentation for accuracy and completeness, and I agree with the above.  Sarina Ser, MD

## 2020-12-21 ENCOUNTER — Encounter: Payer: Self-pay | Admitting: Dermatology

## 2020-12-25 ENCOUNTER — Ambulatory Visit: Payer: PPO | Admitting: Podiatry

## 2020-12-25 ENCOUNTER — Other Ambulatory Visit: Payer: Self-pay

## 2020-12-25 DIAGNOSIS — B351 Tinea unguium: Secondary | ICD-10-CM

## 2020-12-25 DIAGNOSIS — M79674 Pain in right toe(s): Secondary | ICD-10-CM | POA: Diagnosis not present

## 2020-12-25 DIAGNOSIS — M79675 Pain in left toe(s): Secondary | ICD-10-CM | POA: Diagnosis not present

## 2020-12-25 NOTE — Progress Notes (Signed)
   SUBJECTIVE Patient presents to office today complaining of elongated, thickened nails that cause pain while ambulating in shoes.  She is unable to trim her own nails. Patient is here for further evaluation and treatment.  Past Medical History:  Diagnosis Date  . Actinic keratosis   . Acute thoracic back pain   . Asthma   . Basal cell carcinoma    nose   . Chest pain    unspecified  . CHF (congestive heart failure) ([place]) 04/30/2017  . Chronic abdominal pain 05/01/2017   unspecified  . Congenital heart failure ([place])   . COPD (chronic obstructive pulmonary disease) ([place])   . Disease of upper respiratory system 04/30/2017  . Dyspnea on exertion   . Esophageal reflux disease 04/30/2017  . History of bone density study 06/28/2004  . Leg swelling   . Lumbar arthropathy   . Nasopharyngitis 04/30/2017  . Osteoarthropathy 04/30/2017  . Osteoporosis 04/30/2017  . Palpitations   . Sinusitis, acute 04/30/2017   unspecified  . Squamous cell carcinoma of skin 07/28/2017   Right medial knee. Hypertrophic SCCis. Tx: [facility] 07/28/2017    OBJECTIVE General Patient is awake, alert, and oriented x 3 and in no acute distress. Derm Skin is dry and supple bilateral. Negative open lesions or macerations. Remaining integument unremarkable. Nails are tender, long, thickened and dystrophic with subungual debris, consistent with onychomycosis, 1-5 bilateral. No signs of infection noted. Vasc  DP and PT pedal pulses nonpalpable bilaterally.  Skin is cool to touch Neuro Epicritic and protective threshold sensation grossly intact bilaterally.  Musculoskeletal Exam No symptomatic pedal deformities noted bilateral. Muscular strength within normal limits.  ASSESSMENT 1. Onychodystrophic nails 1-5 bilateral with hyperkeratosis of nails.  2. Onychomycosis of nail due to dermatophyte bilateral 3. Pain in foot bilateral 4.  Raynaud's phenomena  PLAN OF CARE 1. Patient evaluated today.  2. Instructed to  maintain good pedal hygiene and foot care.  3. Mechanical debridement of nails 1-5 bilaterally performed using a nail nipper. Filed with dremel without incident.  4.  Continue management with vein and vascular, Dr. Lucky Cowboy  5.  Return to clinic in 3 mos.    Edrick Kins, DPM [facility]  Dr. Edrick Kins, DPM    [address], [place] [address]                Office [phone]  Fax [phone]

## 2020-12-31 ENCOUNTER — Other Ambulatory Visit: Payer: Self-pay

## 2020-12-31 ENCOUNTER — Ambulatory Visit (INDEPENDENT_AMBULATORY_CARE_PROVIDER_SITE_OTHER): Payer: PPO | Admitting: Internal Medicine

## 2020-12-31 ENCOUNTER — Encounter: Payer: Self-pay | Admitting: Internal Medicine

## 2020-12-31 VITALS — BP 118/68 | HR 64 | Ht 66.0 in | Wt 150.0 lb

## 2020-12-31 DIAGNOSIS — I872 Venous insufficiency (chronic) (peripheral): Secondary | ICD-10-CM

## 2020-12-31 DIAGNOSIS — J45909 Unspecified asthma, uncomplicated: Secondary | ICD-10-CM

## 2020-12-31 DIAGNOSIS — I7300 Raynaud's syndrome without gangrene: Secondary | ICD-10-CM | POA: Diagnosis not present

## 2020-12-31 DIAGNOSIS — K2100 Gastro-esophageal reflux disease with esophagitis, without bleeding: Secondary | ICD-10-CM | POA: Diagnosis not present

## 2020-12-31 DIAGNOSIS — K5710 Diverticulosis of small intestine without perforation or abscess without bleeding: Secondary | ICD-10-CM | POA: Diagnosis not present

## 2020-12-31 DIAGNOSIS — M8000XS Age-related osteoporosis with current pathological fracture, unspecified site, sequela: Secondary | ICD-10-CM

## 2020-12-31 DIAGNOSIS — E785 Hyperlipidemia, unspecified: Secondary | ICD-10-CM | POA: Diagnosis not present

## 2020-12-31 DIAGNOSIS — I10 Essential (primary) hypertension: Secondary | ICD-10-CM | POA: Diagnosis not present

## 2020-12-31 DIAGNOSIS — I5082 Biventricular heart failure: Secondary | ICD-10-CM

## 2020-12-31 NOTE — Assessment & Plan Note (Signed)
Under control 

## 2020-12-31 NOTE — Progress Notes (Signed)
Established Patient Office Visit  Subjective:  Patient ID: Teresa Chambers, female    DOB: Mar 08, 1936  Age: 85 y.o. MRN: [id number]  CC:  Chief Complaint  Patient presents with   Hypertension    HPI  Teresa Chambers presents for general checkup, she denies any chest pain, respirations have unchanged, he denies any swelling of the legs her asthma is stable with esophageal reflux is stable congestive heart failure is under control  Past Medical History:  Diagnosis Date   Actinic keratosis    Acute thoracic back pain    Asthma    Basal cell carcinoma    nose    Chest pain    unspecified   CHF (congestive heart failure) ([place]) 04/30/2017   Chronic abdominal pain 05/01/2017   unspecified   Congenital heart failure (HCC)    COPD (chronic obstructive pulmonary disease) ([place])    Disease of upper respiratory system 04/30/2017   Dyspnea on exertion    Esophageal reflux disease 04/30/2017   History of bone density study 06/28/2004   Leg swelling    Lumbar arthropathy    Nasopharyngitis 04/30/2017   Osteoarthropathy 04/30/2017   Osteoporosis 04/30/2017   Palpitations    Sinusitis, acute 04/30/2017   unspecified   Squamous cell carcinoma of skin 07/28/2017   Right medial knee. Hypertrophic SCCis. Tx: [name] 07/28/2017    Past Surgical History:  Procedure Laterality Date   CATARACT EXTRACTION     07/21/2001 - 07/20/2002   CHOLECYSTECTOMY     07/21/1997 - 07/20/1998   COLON SURGERY     COLONOSCOPY     05/05/2000, 01/17/2000 Adenomatous Polyps     COLONOSCOPY     11/05/2009, 12/23/2004, 07/07/2001   PH Adenomatous Polyps: CBF 10/2014; recall ltr mailed 09/18/2014 (dw)    ESOPHAGOGASTRODUODENOSCOPY     11/05/2009, 05/05/2000   FLEXIBLE SIGMOIDOSCOPY  11/28/1999   HAND SURGERY  2006   HERNIA REPAIR     HIP ARTHROPLASTY Right 02/11/2018   Procedure: ARTHROPLASTY BIPOLAR HIP (HEMIARTHROPLASTY);  Surgeon: Corky Mull, MD;  Location: [facility] ORS;  Service: Orthopedics;  Laterality: Right;    LAPAROSCOPIC COLON RESECTION     2001    Family History  Problem Relation Age of Onset   Heart disease Mother    Heart disease Father    Hypertension Son    Hypertension Daughter     Social History   Socioeconomic History   Marital status: Divorced    Spouse name: Not on file   Number of children: 5   Years of education: 15.5   Highest education level: High school graduate  Occupational History   Occupation: retired  Tobacco Use   Smoking status: Never   Smokeless tobacco: Never  Scientific laboratory technician Use: Never used  Substance and Sexual Activity   Alcohol use: Not Currently   Drug use: Never   Sexual activity: Not Currently  Other Topics Concern   Not on file  Social History Narrative   Lives independently, has home at [name] she enjoys traveling to   Social Determinants of Radio broadcast assistant Strain: Not on file  Food Insecurity: Not on file  Transportation Needs: Not on file  Physical Activity: Not on file  Stress: Not on file  Social Connections: Not on file  Intimate Partner Violence: Not on file     Current Outpatient Medications:    acetaminophen (TYLENOL) 325 MG tablet, Take 1-2 tablets (325-650 mg total) by mouth every 6 (  six) hours as needed for mild pain (pain score 1-3 or temp > 100.5)., Disp: , Rfl:    Ascorbic Acid (VITAMIN C WITH ROSE HIPS) 1000 MG tablet, Take 1,000 mg by mouth daily., Disp: , Rfl:    Calcium Carbonate-Vitamin D 600-400 MG-UNIT tablet, Take 1 tablet by mouth daily., Disp: , Rfl:    clopidogrel (PLAVIX) 75 MG tablet, TAKE 1 TABLET(75 MG) BY MOUTH DAILY, Disp: 30 tablet, Rfl: 3   ezetimibe (ZETIA) 10 MG tablet, Take 1 tablet (10 mg total) by mouth daily., Disp: 90 tablet, Rfl: 3   fluticasone (FLONASE) 50 MCG/ACT nasal spray, SHAKE LIQUID AND USE 2 SPRAYS IN EACH NOSTRIL DAILY, Disp: 48 g, Rfl: 6   fluticasone furoate-vilanterol (BREO ELLIPTA) 200-25 MCG/INH AEPB, Inhale 1 puff into the lungs daily., Disp: 3 each, Rfl:  3   Hydroactive Dressings (DUODERM HYDROACTIVE) PSTE, DuoDerm  Apply  Once a week   Dispense  1, Disp: 30 g, Rfl: 0   levothyroxine (SYNTHROID) 50 MCG tablet, Take 1 tablet (50 mcg total) by mouth daily., Disp: 90 tablet, Rfl: 3   montelukast (SINGULAIR) 10 MG tablet, Take 1 tablet (10 mg total) by mouth daily at 2 PM., Disp: 90 tablet, Rfl: 3   pantoprazole (PROTONIX) 40 MG tablet, Take 1 tablet (40 mg total) by mouth daily., Disp: 30 tablet, Rfl: 3   sacubitril-valsartan (ENTRESTO) 24-26 MG, Take 1 tablet by mouth 2 (two) times daily., Disp: 180 tablet, Rfl: 1   Allergies  Allergen Reactions   Codeine Nausea And Vomiting   Lidocaine Swelling and Anaphylaxis    ROS Review of Systems  Constitutional: Negative.   HENT: Negative.    Eyes: Negative.   Respiratory: Negative.    Cardiovascular: Negative.   Gastrointestinal: Negative.   Endocrine: Negative.   Genitourinary: Negative.   Musculoskeletal: Negative.   Skin: Negative.   Allergic/Immunologic: Negative.   Neurological: Negative.   Hematological: Negative.   Psychiatric/Behavioral: Negative.    All other systems reviewed and are negative.    Objective:    Physical Exam Vitals reviewed.  Constitutional:      Appearance: Normal appearance.  HENT:     Mouth/Throat:     Mouth: Mucous membranes are moist.  Eyes:     Pupils: Pupils are equal, round, and reactive to light.  Neck:     Vascular: No carotid bruit.  Cardiovascular:     Rate and Rhythm: Normal rate and regular rhythm.     Pulses: Normal pulses.     Heart sounds: Normal heart sounds.  Pulmonary:     Effort: Pulmonary effort is normal.     Breath sounds: Normal breath sounds.  Abdominal:     General: Bowel sounds are normal.     Palpations: Abdomen is soft. There is no hepatomegaly, splenomegaly or mass.     Tenderness: There is no abdominal tenderness.     Hernia: No hernia is present.  Musculoskeletal:        General: No tenderness.     Cervical  back: Neck supple.     Right lower leg: No edema.     Left lower leg: No edema.  Skin:    Findings: No rash.  Neurological:     Mental Status: She is alert and oriented to person, place, and time.     Motor: No weakness.  Psychiatric:        Mood and Affect: Mood and affect normal.        Behavior: Behavior normal.  BP 118/68   Pulse 64   Ht 5\' 6"  (1.676 m)   Wt 150 lb (68 kg)   BMI 24.21 kg/m  Wt Readings from Last 3 Encounters:  12/31/20 150 lb (68 kg)  12/07/20 153 lb (69.4 kg)  10/30/20 156 lb 9.6 oz (71 kg)     Health Maintenance Due  Topic Date Due   COVID-19 Vaccine (1) Never done   TETANUS/TDAP  Never done   Zoster Vaccines- Shingrix (1 of 2) Never done   PNA vac Low Risk Adult (1 of 2 - PCV13) Never done    There are no preventive care reminders to display for this patient.  Lab Results  Component Value Date   TSH 4.11 06/04/2020   Lab Results  Component Value Date   WBC 6.5 06/04/2020   HGB 13.7 06/04/2020   HCT 40.8 06/04/2020   MCV 97.4 06/04/2020   PLT 271 06/04/2020   Lab Results  Component Value Date   NA 145 06/04/2020   K 4.4 06/04/2020   CO2 28 06/04/2020   GLUCOSE 87 06/04/2020   BUN 14 06/04/2020   CREATININE 0.64 06/04/2020   BILITOT 0.5 06/04/2020   ALKPHOS 48 03/29/2019   AST 13 06/04/2020   ALT 9 06/04/2020   PROT 6.3 06/04/2020   ALBUMIN 4.0 03/29/2019   CALCIUM 9.7 06/04/2020   ANIONGAP 9 03/29/2019   Lab Results  Component Value Date   CHOL 246 (H) 06/04/2020   Lab Results  Component Value Date   HDL 75 06/04/2020   Lab Results  Component Value Date   LDLCALC 151 (H) 06/04/2020   Lab Results  Component Value Date   TRIG 96 06/04/2020   Lab Results  Component Value Date   CHOLHDL 3.3 06/04/2020   No results found for: HGBA1C    Assessment & Plan:   Problem List Items Addressed This Visit       Cardiovascular and Mediastinum   CHF (congestive heart failure) ([place])    Stable       Chronic  venous insufficiency - Primary    Refer to vein specialist       Essential hypertension    Patient blood pressure is normal patient denies any chest pain or shortness of breath there is no history of palpitation or paroxysmal nocturnal dyspnea   patient was advised to follow low-salt low-cholesterol diet    ideally I want to keep systolic blood pressure below 130 mmHg, patient was asked to check blood pressure one times a week and give me a report on that.  Patient will be follow-up in 3 months  or earlier as needed, patient will call me back for any change in the cardiovascular symptoms Patient was advised to buy a book from local bookstore concerning blood pressure and read several chapters  every day.  This will be supplemented by some of the material we will give him from the office.  Patient should also utilize other resources like YouTube and Internet to learn more about the blood pressure and the diet.       Raynaud's disease without gangrene    Patient was advised to take her Plavix every other day, her work-up for Raynaud's disease was done by vascular specialist         Respiratory   Asthma    Under control         Digestive   Chronic reflux esophagitis    - The patient's GERD is stable on medication.  -  Instructed the patient to avoid eating spicy and acidic foods, as well as foods high in fat. - Instructed the patient to avoid eating large meals or meals 2-3 hours prior to sleeping.       Diverticulosis of small intestine without hemorrhage     Musculoskeletal and Integument   Osteoporosis     Other   Dyslipidemia    No orders of the defined types were placed in this encounter.   Follow-up: No follow-ups on file.    Cletis Athens, MD

## 2020-12-31 NOTE — Assessment & Plan Note (Signed)

## 2020-12-31 NOTE — Assessment & Plan Note (Signed)
Refer to vein specialist

## 2020-12-31 NOTE — Assessment & Plan Note (Signed)
-   The patient's GERD is stable on medication.  - Instructed the patient to avoid eating spicy and acidic foods, as well as foods high in fat. - Instructed the patient to avoid eating large meals or meals 2-3 hours prior to sleeping. 

## 2020-12-31 NOTE — Assessment & Plan Note (Signed)
Stable

## 2020-12-31 NOTE — Assessment & Plan Note (Signed)
Patient was advised to take her Plavix every other day, her work-up for Raynaud's disease was done by vascular specialist

## 2021-01-01 ENCOUNTER — Encounter: Payer: Self-pay | Admitting: Internal Medicine

## 2021-01-02 DIAGNOSIS — M170 Bilateral primary osteoarthritis of knee: Secondary | ICD-10-CM | POA: Diagnosis not present

## 2021-01-13 ENCOUNTER — Other Ambulatory Visit (INDEPENDENT_AMBULATORY_CARE_PROVIDER_SITE_OTHER): Payer: Self-pay | Admitting: Nurse Practitioner

## 2021-01-14 ENCOUNTER — Telehealth (INDEPENDENT_AMBULATORY_CARE_PROVIDER_SITE_OTHER): Payer: Self-pay

## 2021-01-14 ENCOUNTER — Other Ambulatory Visit: Payer: Self-pay | Admitting: *Deleted

## 2021-01-14 ENCOUNTER — Other Ambulatory Visit (INDEPENDENT_AMBULATORY_CARE_PROVIDER_SITE_OTHER): Payer: Self-pay | Admitting: Nurse Practitioner

## 2021-01-14 MED ORDER — PANTOPRAZOLE SODIUM 40 MG PO TBEC: 40.0000 mg | DELAYED_RELEASE_TABLET | Freq: Every day | ORAL | 3 refills | Status: DC

## 2021-01-14 MED ORDER — CLOPIDOGREL BISULFATE 75 MG PO TABS: ORAL_TABLET | ORAL | 3 refills | Status: DC

## 2021-01-14 NOTE — Telephone Encounter (Signed)
Pt called  the nurses line and left a VM wanting to know why her Rx for pantoprazole was denied I called the pt and left a VM on her phone making her aware that she needed to see her PCP to have this medication refilled that the provider her could not refill that medication for her.

## 2021-01-16 DIAGNOSIS — M170 Bilateral primary osteoarthritis of knee: Secondary | ICD-10-CM | POA: Diagnosis not present

## 2021-01-23 DIAGNOSIS — M545 Low back pain, unspecified: Secondary | ICD-10-CM | POA: Diagnosis not present

## 2021-01-23 DIAGNOSIS — M170 Bilateral primary osteoarthritis of knee: Secondary | ICD-10-CM | POA: Diagnosis not present

## 2021-01-29 ENCOUNTER — Other Ambulatory Visit: Payer: Self-pay | Admitting: *Deleted

## 2021-01-29 MED ORDER — BREO ELLIPTA 200-25 MCG/INH IN AEPB: 1.0000 | INHALATION_SPRAY | Freq: Every day | RESPIRATORY_TRACT | 3 refills | Status: DC

## 2021-01-30 DIAGNOSIS — M170 Bilateral primary osteoarthritis of knee: Secondary | ICD-10-CM | POA: Diagnosis not present

## 2021-01-30 DIAGNOSIS — M47816 Spondylosis without myelopathy or radiculopathy, lumbar region: Secondary | ICD-10-CM | POA: Diagnosis not present

## 2021-01-30 DIAGNOSIS — M4807 Spinal stenosis, lumbosacral region: Secondary | ICD-10-CM | POA: Diagnosis not present

## 2021-02-04 ENCOUNTER — Telehealth: Payer: Self-pay | Admitting: Pharmacist

## 2021-02-04 NOTE — Progress Notes (Signed)
[place])                                            [place] Team    02/04/2021  Teresa Chambers 1936-04-10 [id number]   Call placed to patient in follow up for patient outreach to [employer] P[employer] Medicare for medication assistance in 2021. Calling to investigate if patient's continues to need patient assistance for medications in 2022.  No answer. HIPAA compliant voice mail left for patient to return my call.   Loretha Brasil, PharmD [place] Clinical Pharmacist Direct Dial: [phone]

## 2021-02-05 ENCOUNTER — Telehealth: Payer: Self-pay | Admitting: Pharmacy Technician

## 2021-02-05 ENCOUNTER — Other Ambulatory Visit (HOSPITAL_COMMUNITY): Payer: Self-pay | Admitting: Student

## 2021-02-05 ENCOUNTER — Other Ambulatory Visit: Payer: Self-pay | Admitting: Student

## 2021-02-05 DIAGNOSIS — M4807 Spinal stenosis, lumbosacral region: Secondary | ICD-10-CM

## 2021-02-05 DIAGNOSIS — Z596 Low income: Secondary | ICD-10-CM

## 2021-02-05 DIAGNOSIS — M47816 Spondylosis without myelopathy or radiculopathy, lumbar region: Secondary | ICD-10-CM

## 2021-02-05 NOTE — Progress Notes (Signed)
[name])                                            [name]    02/05/2021  Teresa Chambers [birth year] [id number]                                      Medication Assistance Referral  Referral From: [name]  Medication/Company: [name]  / [name] Patient application portion:  N/A Patient sent to [name] Provider application portion:  N/A Patient had provider sign  to Dr. Cletis Athens Provider address/fax verified via: Office website  Received both patient and provider portion(s) of patient assistance application(s) for [name].   Faxed completed application and required documents into [name].   [name] P. [name], [place]  [phone]

## 2021-02-05 NOTE — Progress Notes (Signed)
[facility])  [place] Team    02/05/2021  NITYA CAUTHON 1936-05-04 [id number]  Reason for referral: Medication Assistance, Follow up on [employer] 2021patient assistance list   Referral source:  HTA  Current insurance: [employer]  Outreach:  Successful telephone call with [place] Diones.  HIPAA identifiers verified.   Subjective:  Ms. Brevik has already completed her portion of the [place] application and obtained a copy of her prescription for Adair Patter from Dr. [name] office. Patient reports that she was approved for a grant from [place] for assistance with costs for her Entresto. Patient reports that she has met the required $600 out of pocket spend on medications through her Part D plan with [employer] [place].    Assessment:  Medication Review Findings:  Patient would like for [place] to assist her with the submission of her [place] application for [facility] Ellipta inhaler.    Medication Assistance Findings:  Medication assistance needs identified: Aneta Mins inhaler   Patient Assistance Programs: Adair Patter made by [place] requirement met: Yes, per verbal estimate provided by patient Out-of-pocket prescription expenditure met:   Yes, per verbal estimate provided by patient.  Patient has met application requirements to apply for this program.    Additional medication assistance options reviewed with patient as warranted:  No other options identified  Plan: I will route patient assistance letter to [place] technician who will coordinate patient assistance program application process for medications listed above.  [facility] pharmacy technician will assist with obtaining all required documents from both patient and provider(s) and submit application(s) once completed.   Loretha Brasil, PharmD [place] Clinical Pharmacist Direct Dial: [phone]

## 2021-02-07 ENCOUNTER — Ambulatory Visit: Payer: PPO

## 2021-02-07 ENCOUNTER — Other Ambulatory Visit: Payer: Self-pay

## 2021-02-13 ENCOUNTER — Other Ambulatory Visit: Payer: Self-pay

## 2021-02-13 ENCOUNTER — Ambulatory Visit
Admission: RE | Admit: 2021-02-13 | Discharge: 2021-02-13 | Disposition: A | Discharge status: Home / Self Care | Payer: PPO | Source: Ambulatory Visit | Attending: Student | Admitting: Student

## 2021-02-13 DIAGNOSIS — S3219XA Other fracture of sacrum, initial encounter for closed fracture: Secondary | ICD-10-CM | POA: Diagnosis not present

## 2021-02-13 DIAGNOSIS — M47816 Spondylosis without myelopathy or radiculopathy, lumbar region: Secondary | ICD-10-CM | POA: Diagnosis not present

## 2021-02-13 DIAGNOSIS — M4807 Spinal stenosis, lumbosacral region: Secondary | ICD-10-CM | POA: Diagnosis not present

## 2021-02-13 DIAGNOSIS — M5126 Other intervertebral disc displacement, lumbar region: Secondary | ICD-10-CM | POA: Diagnosis not present

## 2021-02-13 DIAGNOSIS — M48061 Spinal stenosis, lumbar region without neurogenic claudication: Secondary | ICD-10-CM | POA: Diagnosis not present

## 2021-02-13 DIAGNOSIS — M4319 Spondylolisthesis, multiple sites in spine: Secondary | ICD-10-CM | POA: Diagnosis not present

## 2021-02-15 DIAGNOSIS — M47816 Spondylosis without myelopathy or radiculopathy, lumbar region: Secondary | ICD-10-CM | POA: Diagnosis not present

## 2021-02-15 DIAGNOSIS — M5136 Other intervertebral disc degeneration, lumbar region: Secondary | ICD-10-CM | POA: Diagnosis not present

## 2021-02-17 DIAGNOSIS — L03116 Cellulitis of left lower limb: Secondary | ICD-10-CM | POA: Diagnosis not present

## 2021-02-18 ENCOUNTER — Other Ambulatory Visit: Payer: Self-pay | Admitting: *Deleted

## 2021-02-18 ENCOUNTER — Telehealth: Payer: Self-pay | Admitting: Pharmacy Technician

## 2021-02-18 DIAGNOSIS — Z596 Low income: Secondary | ICD-10-CM

## 2021-02-18 NOTE — Progress Notes (Signed)
[facility])                                            [place] Team    02/18/2021  Teresa Chambers 31-Dec-1935 [name]  Care coordination call placed to [place] in regards to [name] application.  Spoke to [place] who informed patient was APPROVED 02/15/21-07/20/21. She informed request was sent to pharmacy on 02/15/21 and medication should be delivered to her home in the next 7-10 business days.   Emmette Katt P. Joylynn Defrancesco, [place]  [phone]

## 2021-02-20 ENCOUNTER — Encounter: Payer: Self-pay | Admitting: Internal Medicine

## 2021-02-20 ENCOUNTER — Ambulatory Visit (INDEPENDENT_AMBULATORY_CARE_PROVIDER_SITE_OTHER): Payer: PPO | Admitting: Internal Medicine

## 2021-02-20 ENCOUNTER — Other Ambulatory Visit: Payer: Self-pay

## 2021-02-20 VITALS — BP 137/71 | HR 85 | Ht 66.0 in | Wt 156.4 lb

## 2021-02-20 DIAGNOSIS — I872 Venous insufficiency (chronic) (peripheral): Secondary | ICD-10-CM

## 2021-02-20 DIAGNOSIS — I7300 Raynaud's syndrome without gangrene: Secondary | ICD-10-CM | POA: Diagnosis not present

## 2021-02-20 DIAGNOSIS — I10 Essential (primary) hypertension: Secondary | ICD-10-CM

## 2021-02-20 DIAGNOSIS — K2100 Gastro-esophageal reflux disease with esophagitis, without bleeding: Secondary | ICD-10-CM | POA: Diagnosis not present

## 2021-02-20 DIAGNOSIS — J45909 Unspecified asthma, uncomplicated: Secondary | ICD-10-CM | POA: Diagnosis not present

## 2021-02-20 DIAGNOSIS — L02419 Cutaneous abscess of limb, unspecified: Secondary | ICD-10-CM

## 2021-02-20 DIAGNOSIS — L03119 Cellulitis of unspecified part of limb: Secondary | ICD-10-CM | POA: Diagnosis not present

## 2021-02-20 DIAGNOSIS — I5082 Biventricular heart failure: Secondary | ICD-10-CM

## 2021-02-20 NOTE — Assessment & Plan Note (Signed)
Stable at the present time. 

## 2021-02-20 NOTE — Assessment & Plan Note (Signed)
Patient has an ulcer of the left leg and it has a evidence of cellulitis  patient has been started on doxycycline.

## 2021-02-20 NOTE — Progress Notes (Signed)
Established Patient Office Visit  Subjective:  Patient ID: Teresa Chambers, female    DOB: Dec 02, 1935  Age: 85 y.o. MRN: [id number]  CC:  Chief Complaint  Patient presents with   Blister    Patient got a blister on her leg that was caused by her dog jumping on her and then after the blister came up the dogs jumped on her again and bursted the blister. .Patient went to urgent care and was started on antibiotic .     HPI  Teresa Chambers presents for general checkup, she denies any chest pain, respirations have unchanged, he denies any swelling of the legs her asthma is stable with esophageal reflux is stable congestive heart failure is under control, she also has hyperlipidemia  Past Medical History:  Diagnosis Date   Actinic keratosis    Acute thoracic back pain    Asthma    Basal cell carcinoma    nose    Chest pain    unspecified   CHF (congestive heart failure) ([place]) 04/30/2017   Chronic abdominal pain 05/01/2017   unspecified   Congenital heart failure (HCC)    COPD (chronic obstructive pulmonary disease) ([place])    Disease of upper respiratory system 04/30/2017   Dyspnea on exertion    Esophageal reflux disease 04/30/2017   History of bone density study 06/28/2004   Leg swelling    Lumbar arthropathy    Nasopharyngitis 04/30/2017   Osteoarthropathy 04/30/2017   Osteoporosis 04/30/2017   Palpitations    Sinusitis, acute 04/30/2017   unspecified   Squamous cell carcinoma of skin 07/28/2017   Right medial knee. Hypertrophic SCCis. Tx: [facility] 07/28/2017    Past Surgical History:  Procedure Laterality Date   CATARACT EXTRACTION     07/21/2001 - 07/20/2002   CHOLECYSTECTOMY     07/21/1997 - 07/20/1998   COLON SURGERY     COLONOSCOPY     05/05/2000, 01/17/2000 Adenomatous Polyps     COLONOSCOPY     11/05/2009, 12/23/2004, 07/07/2001   PH Adenomatous Polyps: CBF 10/2014; recall ltr mailed 09/18/2014 (dw)    ESOPHAGOGASTRODUODENOSCOPY     11/05/2009, 05/05/2000   FLEXIBLE  SIGMOIDOSCOPY  11/28/1999   HAND SURGERY  2006   HERNIA REPAIR     HIP ARTHROPLASTY Right 02/11/2018   Procedure: ARTHROPLASTY BIPOLAR HIP (HEMIARTHROPLASTY);  Surgeon: Corky Mull, MD;  Location: [facility] ORS;  Service: Orthopedics;  Laterality: Right;   LAPAROSCOPIC COLON RESECTION     2001    Family History  Problem Relation Age of Onset   Heart disease Mother    Heart disease Father    Hypertension Son    Hypertension Daughter     Social History   Socioeconomic History   Marital status: Divorced    Spouse name: Not on file   Number of children: 5   Years of education: 15.5   Highest education level: High school graduate  Occupational History   Occupation: retired  Tobacco Use   Smoking status: Never   Smokeless tobacco: Never  Scientific laboratory technician Use: Never used  Substance and Sexual Activity   Alcohol use: Not Currently   Drug use: Never   Sexual activity: Not Currently  Other Topics Concern   Not on file  Social History Narrative   Lives independently, has home at [facility] she enjoys traveling to   Social Determinants of Radio broadcast assistant Strain: Not on file  Food Insecurity: Not on file  Transportation Needs:  Not on file  Physical Activity: Not on file  Stress: Not on file  Social Connections: Not on file  Intimate Partner Violence: Not on file     Current Outpatient Medications:    acetaminophen (TYLENOL) 325 MG tablet, Take 1-2 tablets (325-650 mg total) by mouth every 6 (six) hours as needed for mild pain (pain score 1-3 or temp > 100.5)., Disp: , Rfl:    Ascorbic Acid (VITAMIN C WITH ROSE HIPS) 1000 MG tablet, Take 1,000 mg by mouth daily., Disp: , Rfl:    BREO ELLIPTA 200-25 MCG/INH AEPB, Inhale 1 puff into the lungs daily., Disp: 3 each, Rfl: 3   Calcium Carbonate-Vitamin D 600-400 MG-UNIT tablet, Take 1 tablet by mouth daily., Disp: , Rfl:    clopidogrel (PLAVIX) 75 MG tablet, TAKE 1 TABLET(75 MG) BY MOUTH DAILY, Disp: 90 tablet, Rfl:  3   doxycycline (VIBRA-TABS) 100 MG tablet, Take 100 mg by mouth 2 (two) times daily., Disp: , Rfl:    ezetimibe (ZETIA) 10 MG tablet, Take 1 tablet (10 mg total) by mouth daily., Disp: 90 tablet, Rfl: 3   fluticasone (FLONASE) 50 MCG/ACT nasal spray, SHAKE LIQUID AND USE 2 SPRAYS IN EACH NOSTRIL DAILY, Disp: 48 g, Rfl: 6   Hydroactive Dressings (DUODERM HYDROACTIVE) PSTE, DuoDerm  Apply  Once a week   Dispense  1, Disp: 30 g, Rfl: 0   levothyroxine (SYNTHROID) 50 MCG tablet, Take 1 tablet (50 mcg total) by mouth daily., Disp: 90 tablet, Rfl: 3   montelukast (SINGULAIR) 10 MG tablet, Take 1 tablet (10 mg total) by mouth daily at 2 PM., Disp: 90 tablet, Rfl: 3   pantoprazole (PROTONIX) 40 MG tablet, Take 1 tablet (40 mg total) by mouth daily., Disp: 90 tablet, Rfl: 3   sacubitril-valsartan (ENTRESTO) 24-26 MG, Take 1 tablet by mouth 2 (two) times daily. (Patient taking differently: Take 1 tablet by mouth daily at 2 PM.), Disp: 180 tablet, Rfl: 1   Allergies  Allergen Reactions   Codeine Nausea And Vomiting   Lidocaine Swelling and Anaphylaxis    ROS Review of Systems  Constitutional: Negative.   HENT: Negative.    Eyes: Negative.   Respiratory: Negative.    Cardiovascular: Negative.   Gastrointestinal: Negative.   Endocrine: Negative.   Genitourinary: Negative.   Musculoskeletal: Negative.   Skin: Negative.   Allergic/Immunologic: Negative.   Neurological: Negative.   Hematological: Negative.   Psychiatric/Behavioral: Negative.    All other systems reviewed and are negative.    Objective:    Physical Exam Vitals reviewed.  Constitutional:      Appearance: Normal appearance.  HENT:     Mouth/Throat:     Mouth: Mucous membranes are moist.  Eyes:     Pupils: Pupils are equal, round, and reactive to light.  Neck:     Vascular: No carotid bruit.  Cardiovascular:     Rate and Rhythm: Normal rate and regular rhythm.     Pulses: Normal pulses.     Heart sounds: Normal heart  sounds.  Pulmonary:     Effort: Pulmonary effort is normal.     Breath sounds: Normal breath sounds.  Abdominal:     General: Bowel sounds are normal.     Palpations: Abdomen is soft. There is no hepatomegaly, splenomegaly or mass.     Tenderness: There is no abdominal tenderness.     Hernia: No hernia is present.  Musculoskeletal:        General: No tenderness.  Cervical back: Neck supple.     Right lower leg: No edema.     Left lower leg: No edema.  Skin:    Findings: No rash.  Neurological:     Mental Status: She is alert and oriented to person, place, and time.     Motor: No weakness.  Psychiatric:        Mood and Affect: Mood and affect normal.        Behavior: Behavior normal.    BP 137/71   Pulse 85   Ht '5\' 6"'$  (1.676 m)   Wt 156 lb 6.4 oz (70.9 kg)   BMI 25.24 kg/m  Wt Readings from Last 3 Encounters:  02/20/21 156 lb 6.4 oz (70.9 kg)  12/31/20 150 lb (68 kg)  12/07/20 153 lb (69.4 kg)     Health Maintenance Due  Topic Date Due   TETANUS/TDAP  Never done   Zoster Vaccines- Shingrix (1 of 2) Never done   PNA vac Low Risk Adult (2 of 2 - PPSV23) 07/01/2014   COVID-19 Vaccine (4 - Booster for Pfizer series) 08/10/2020   INFLUENZA VACCINE  02/18/2021    There are no preventive care reminders to display for this patient.  Lab Results  Component Value Date   TSH 4.11 06/04/2020   Lab Results  Component Value Date   WBC 6.5 06/04/2020   HGB 13.7 06/04/2020   HCT 40.8 06/04/2020   MCV 97.4 06/04/2020   PLT 271 06/04/2020   Lab Results  Component Value Date   NA 145 06/04/2020   K 4.4 06/04/2020   CO2 28 06/04/2020   GLUCOSE 87 06/04/2020   BUN 14 06/04/2020   CREATININE 0.64 06/04/2020   BILITOT 0.5 06/04/2020   ALKPHOS 48 03/29/2019   AST 13 06/04/2020   ALT 9 06/04/2020   PROT 6.3 06/04/2020   ALBUMIN 4.0 03/29/2019   CALCIUM 9.7 06/04/2020   ANIONGAP 9 03/29/2019   Lab Results  Component Value Date   CHOL 246 (H) 06/04/2020   Lab  Results  Component Value Date   HDL 75 06/04/2020   Lab Results  Component Value Date   LDLCALC 151 (H) 06/04/2020   Lab Results  Component Value Date   TRIG 96 06/04/2020   Lab Results  Component Value Date   CHOLHDL 3.3 06/04/2020   No results found for: HGBA1C    Assessment & Plan:   Problem List Items Addressed This Visit       Cardiovascular and Mediastinum   CHF (congestive heart failure) ([place]) - Primary    Patient was advised on low-salt diet.       Chronic venous insufficiency   Essential hypertension     Patient denies any chest pain or shortness of breath there is no history of palpitation or paroxysmal nocturnal dyspnea   patient was advised to follow low-salt low-cholesterol diet    ideally I want to keep systolic blood pressure below 130 mmHg, patient was asked to check blood pressure one times a week and give me a report on that.  Patient will be follow-up in 3 months  or earlier as needed, patient will call me back for any change in the cardiovascular symptoms Patient was advised to buy a book from local bookstore concerning blood pressure and read several chapters  every day.  This will be supplemented by some of the material we will give him from the office.  Patient should also utilize other resources like YouTube and Internet to  learn more about the blood pressure and the diet.       Raynaud's disease without gangrene    Stable at the present time         Respiratory   Asthma    Under control on medication         Digestive   Chronic reflux esophagitis    - The patient's GERD is stable on medication.  - Instructed the patient to avoid eating spicy and acidic foods, as well as foods high in fat. - Instructed the patient to avoid eating large meals or meals 2-3 hours prior to sleeping.         Other   Cellulitis and abscess of lower extremity    Patient has an ulcer of the left leg and it has a evidence of cellulitis  patient has been  started on doxycycline.       No orders of the defined types were placed in this encounter.   Follow-up: No follow-ups on file.    Cletis Athens, MD

## 2021-02-20 NOTE — Assessment & Plan Note (Signed)
-   The patient's GERD is stable on medication.  - Instructed the patient to avoid eating spicy and acidic foods, as well as foods high in fat. - Instructed the patient to avoid eating large meals or meals 2-3 hours prior to sleeping. 

## 2021-02-20 NOTE — Assessment & Plan Note (Signed)
Patient was advised on low-salt diet.

## 2021-02-20 NOTE — Assessment & Plan Note (Signed)

## 2021-02-20 NOTE — Assessment & Plan Note (Signed)
Under control on medication. 

## 2021-03-02 ENCOUNTER — Other Ambulatory Visit: Payer: Self-pay | Admitting: Internal Medicine

## 2021-03-02 DIAGNOSIS — E785 Hyperlipidemia, unspecified: Secondary | ICD-10-CM

## 2021-03-04 ENCOUNTER — Other Ambulatory Visit: Payer: Self-pay

## 2021-03-04 ENCOUNTER — Ambulatory Visit (INDEPENDENT_AMBULATORY_CARE_PROVIDER_SITE_OTHER): Payer: PPO | Admitting: Internal Medicine

## 2021-03-04 ENCOUNTER — Encounter: Payer: Self-pay | Admitting: Internal Medicine

## 2021-03-04 VITALS — BP 132/71 | HR 82 | Ht 66.0 in | Wt 150.6 lb

## 2021-03-04 DIAGNOSIS — I872 Venous insufficiency (chronic) (peripheral): Secondary | ICD-10-CM

## 2021-03-04 DIAGNOSIS — E063 Autoimmune thyroiditis: Secondary | ICD-10-CM

## 2021-03-04 DIAGNOSIS — E038 Other specified hypothyroidism: Secondary | ICD-10-CM

## 2021-03-04 DIAGNOSIS — I10 Essential (primary) hypertension: Secondary | ICD-10-CM | POA: Diagnosis not present

## 2021-03-04 DIAGNOSIS — J449 Chronic obstructive pulmonary disease, unspecified: Secondary | ICD-10-CM

## 2021-03-04 DIAGNOSIS — K2100 Gastro-esophageal reflux disease with esophagitis, without bleeding: Secondary | ICD-10-CM | POA: Diagnosis not present

## 2021-03-10 ENCOUNTER — Encounter: Payer: Self-pay | Admitting: Internal Medicine

## 2021-03-10 NOTE — Assessment & Plan Note (Signed)

## 2021-03-10 NOTE — Assessment & Plan Note (Signed)
No ulcers are detected in the leg.  No intervention is needed.  She is followed up by vascular specialist.

## 2021-03-10 NOTE — Assessment & Plan Note (Signed)
Patient is on levothyroxine

## 2021-03-10 NOTE — Assessment & Plan Note (Signed)
-   The patient's GERD is stable on medication.  - Instructed the patient to avoid eating spicy and acidic foods, as well as foods high in fat. - Instructed the patient to avoid eating large meals or meals 2-3 hours prior to sleeping. 

## 2021-03-10 NOTE — Assessment & Plan Note (Signed)
COPD is stable.

## 2021-03-10 NOTE — Progress Notes (Signed)
Established Patient Office Visit  Subjective:  Patient ID: Teresa Chambers, female    DOB: 04-Jan-1936  Age: 85 y.o. MRN: [name]  CC:  Chief Complaint  Patient presents with   Follow-up    HPI  Teresa Chambers presents for general checkup, she denies any chest pain, respirations have unchanged, he denies any swelling of the legs her asthma is stable with esophageal reflux is stable congestive heart failure is under control, she also has hyperlipidemia, she was not found to be wheezing.  Past Medical History:  Diagnosis Date   Actinic keratosis    Acute thoracic back pain    Asthma    Basal cell carcinoma    nose    Chest pain    unspecified   CHF (congestive heart failure) ([place]) 04/30/2017   Chronic abdominal pain 05/01/2017   unspecified   Congenital heart failure (HCC)    COPD (chronic obstructive pulmonary disease) (HCC)    Disease of upper respiratory system 04/30/2017   Dyspnea on exertion    Esophageal reflux disease 04/30/2017   History of bone density study 06/28/2004   Leg swelling    Lumbar arthropathy    Nasopharyngitis 04/30/2017   Osteoarthropathy 04/30/2017   Osteoporosis 04/30/2017   Palpitations    Sinusitis, acute 04/30/2017   unspecified   Squamous cell carcinoma of skin 07/28/2017   Right medial knee. Hypertrophic SCCis. Tx: [facility] 07/28/2017    Past Surgical History:  Procedure Laterality Date   CATARACT EXTRACTION     07/21/2001 - 07/20/2002   CHOLECYSTECTOMY     07/21/1997 - 07/20/1998   COLON SURGERY     COLONOSCOPY     05/05/2000, 01/17/2000 Adenomatous Polyps     COLONOSCOPY     11/05/2009, 12/23/2004, 07/07/2001   PH Adenomatous Polyps: CBF 10/2014; recall ltr mailed 09/18/2014 (dw)    ESOPHAGOGASTRODUODENOSCOPY     11/05/2009, 05/05/2000   FLEXIBLE SIGMOIDOSCOPY  11/28/1999   HAND SURGERY  2006   HERNIA REPAIR     HIP ARTHROPLASTY Right 02/11/2018   Procedure: ARTHROPLASTY BIPOLAR HIP (HEMIARTHROPLASTY);  Surgeon: Corky Mull, MD;   Location: [facility] ORS;  Service: Orthopedics;  Laterality: Right;   LAPAROSCOPIC COLON RESECTION     2001    Family History  Problem Relation Age of Onset   Heart disease Mother    Heart disease Father    Hypertension Son    Hypertension Daughter     Social History   Socioeconomic History   Marital status: Divorced    Spouse name: Not on file   Number of children: 5   Years of education: 15.5   Highest education level: High school graduate  Occupational History   Occupation: retired  Tobacco Use   Smoking status: Never   Smokeless tobacco: Never  Scientific laboratory technician Use: Never used  Substance and Sexual Activity   Alcohol use: Not Currently   Drug use: Never   Sexual activity: Not Currently  Other Topics Concern   Not on file  Social History Narrative   Lives independently, has home at [facility] she enjoys traveling to   Social Determinants of Radio broadcast assistant Strain: Not on file  Food Insecurity: Not on file  Transportation Needs: Not on file  Physical Activity: Not on file  Stress: Not on file  Social Connections: Not on file  Intimate Partner Violence: Not on file     Current Outpatient Medications:    acetaminophen (TYLENOL) 325 MG  tablet, Take 1-2 tablets (325-650 mg total) by mouth every 6 (six) hours as needed for mild pain (pain score 1-3 or temp > 100.5)., Disp: , Rfl:    Ascorbic Acid (VITAMIN C WITH ROSE HIPS) 1000 MG tablet, Take 1,000 mg by mouth daily., Disp: , Rfl:    BREO ELLIPTA 200-25 MCG/INH AEPB, Inhale 1 puff into the lungs daily., Disp: 3 each, Rfl: 3   Calcium Carbonate-Vitamin D 600-400 MG-UNIT tablet, Take 1 tablet by mouth daily., Disp: , Rfl:    doxycycline (VIBRA-TABS) 100 MG tablet, Take 100 mg by mouth 2 (two) times daily., Disp: , Rfl:    ezetimibe (ZETIA) 10 MG tablet, TAKE 1 TABLET(10 MG) BY MOUTH DAILY, Disp: 90 tablet, Rfl: 3   fluticasone (FLONASE) 50 MCG/ACT nasal spray, SHAKE LIQUID AND USE 2 SPRAYS IN EACH  NOSTRIL DAILY, Disp: 48 g, Rfl: 6   Hydroactive Dressings (DUODERM HYDROACTIVE) PSTE, DuoDerm  Apply  Once a week   Dispense  1, Disp: 30 g, Rfl: 0   levothyroxine (SYNTHROID) 50 MCG tablet, Take 1 tablet (50 mcg total) by mouth daily., Disp: 90 tablet, Rfl: 3   montelukast (SINGULAIR) 10 MG tablet, Take 1 tablet (10 mg total) by mouth daily at 2 PM., Disp: 90 tablet, Rfl: 3   pantoprazole (PROTONIX) 40 MG tablet, Take 1 tablet (40 mg total) by mouth daily., Disp: 90 tablet, Rfl: 3   sacubitril-valsartan (ENTRESTO) 24-26 MG, Take 1 tablet by mouth 2 (two) times daily. (Patient taking differently: Take 1 tablet by mouth daily at 2 PM.), Disp: 180 tablet, Rfl: 1   Allergies  Allergen Reactions   Codeine Nausea And Vomiting   Lidocaine Swelling and Anaphylaxis    ROS Review of Systems  Constitutional: Negative.   HENT: Negative.    Eyes: Negative.   Respiratory: Negative.    Cardiovascular: Negative.   Gastrointestinal: Negative.   Endocrine: Negative.   Genitourinary: Negative.   Musculoskeletal: Negative.   Skin: Negative.   Allergic/Immunologic: Negative.   Neurological: Negative.   Hematological: Negative.   Psychiatric/Behavioral: Negative.    All other systems reviewed and are negative.    Objective:    Physical Exam Vitals reviewed.  Constitutional:      Appearance: Normal appearance.  HENT:     Mouth/Throat:     Mouth: Mucous membranes are moist.  Eyes:     Pupils: Pupils are equal, round, and reactive to light.  Neck:     Vascular: No carotid bruit.  Cardiovascular:     Rate and Rhythm: Normal rate and regular rhythm.     Pulses: Normal pulses.     Heart sounds: Normal heart sounds.  Pulmonary:     Effort: Pulmonary effort is normal.     Breath sounds: Normal breath sounds.  Abdominal:     General: Bowel sounds are normal.     Palpations: Abdomen is soft. There is no hepatomegaly, splenomegaly or mass.     Tenderness: There is no abdominal tenderness.      Hernia: No hernia is present.  Musculoskeletal:        General: No tenderness.     Cervical back: Neck supple.     Right lower leg: No edema.     Left lower leg: No edema.  Skin:    Findings: No rash.  Neurological:     Mental Status: She is alert and oriented to person, place, and time.     Motor: No weakness.  Psychiatric:  Mood and Affect: Mood and affect normal.        Behavior: Behavior normal.    BP 132/71   Pulse 82   Ht '5\' 6"'$  (1.676 m)   Wt 150 lb 9.6 oz (68.3 kg)   BMI 24.31 kg/m  Wt Readings from Last 3 Encounters:  03/04/21 150 lb 9.6 oz (68.3 kg)  02/20/21 156 lb 6.4 oz (70.9 kg)  12/31/20 150 lb (68 kg)     Health Maintenance Due  Topic Date Due   TETANUS/TDAP  Never done   Zoster Vaccines- Shingrix (1 of 2) Never done   PNA vac Low Risk Adult (2 of 2 - PPSV23) 07/01/2014   COVID-19 Vaccine (4 - Booster for Pfizer series) 08/10/2020   INFLUENZA VACCINE  02/18/2021    There are no preventive care reminders to display for this patient.  Lab Results  Component Value Date   TSH 4.11 06/04/2020   Lab Results  Component Value Date   WBC 6.5 06/04/2020   HGB 13.7 06/04/2020   HCT 40.8 06/04/2020   MCV 97.4 06/04/2020   PLT 271 06/04/2020   Lab Results  Component Value Date   NA 145 06/04/2020   K 4.4 06/04/2020   CO2 28 06/04/2020   GLUCOSE 87 06/04/2020   BUN 14 06/04/2020   CREATININE 0.64 06/04/2020   BILITOT 0.5 06/04/2020   ALKPHOS 48 03/29/2019   AST 13 06/04/2020   ALT 9 06/04/2020   PROT 6.3 06/04/2020   ALBUMIN 4.0 03/29/2019   CALCIUM 9.7 06/04/2020   ANIONGAP 9 03/29/2019   Lab Results  Component Value Date   CHOL 246 (H) 06/04/2020   Lab Results  Component Value Date   HDL 75 06/04/2020   Lab Results  Component Value Date   LDLCALC 151 (H) 06/04/2020   Lab Results  Component Value Date   TRIG 96 06/04/2020   Lab Results  Component Value Date   CHOLHDL 3.3 06/04/2020   No results found for: HGBA1C     Assessment & Plan:   Problem List Items Addressed This Visit       Cardiovascular and Mediastinum   Chronic venous insufficiency    No ulcers are detected in the leg.  No intervention is needed.  She is followed up by vascular specialist.      Essential hypertension - Primary     Patient denies any chest pain or shortness of breath there is no history of palpitation or paroxysmal nocturnal dyspnea   patient was advised to follow low-salt low-cholesterol diet    ideally I want to keep systolic blood pressure below 130 mmHg, patient was asked to check blood pressure one times a week and give me a report on that.  Patient will be follow-up in 3 months  or earlier as needed, patient will call me back for any change in the cardiovascular symptoms Patient was advised to buy a book from local bookstore concerning blood pressure and read several chapters  every day.  This will be supplemented by some of the material we will give him from the office.  Patient should also utilize other resources like YouTube and Internet to learn more about the blood pressure and the diet.        Respiratory   Chronic obstructive pulmonary disease (HCC)    COPD is stable.        Digestive   Chronic reflux esophagitis    - The patient's GERD is stable on medication.  -  Instructed the patient to avoid eating spicy and acidic foods, as well as foods high in fat. - Instructed the patient to avoid eating large meals or meals 2-3 hours prior to sleeping.        Endocrine   Hypothyroidism due to Hashimoto's thyroiditis    Patient is on levothyroxine      No orders of the defined types were placed in this encounter.   Follow-up: No follow-ups on file.    Cletis Athens, MD

## 2021-03-12 DIAGNOSIS — M48062 Spinal stenosis, lumbar region with neurogenic claudication: Secondary | ICD-10-CM | POA: Diagnosis not present

## 2021-03-12 DIAGNOSIS — M5416 Radiculopathy, lumbar region: Secondary | ICD-10-CM | POA: Diagnosis not present

## 2021-03-31 ENCOUNTER — Other Ambulatory Visit: Payer: Self-pay | Admitting: Internal Medicine

## 2021-04-02 DIAGNOSIS — M5136 Other intervertebral disc degeneration, lumbar region: Secondary | ICD-10-CM | POA: Diagnosis not present

## 2021-04-02 DIAGNOSIS — M48062 Spinal stenosis, lumbar region with neurogenic claudication: Secondary | ICD-10-CM | POA: Diagnosis not present

## 2021-04-02 DIAGNOSIS — M47816 Spondylosis without myelopathy or radiculopathy, lumbar region: Secondary | ICD-10-CM | POA: Diagnosis not present

## 2021-04-02 DIAGNOSIS — M5416 Radiculopathy, lumbar region: Secondary | ICD-10-CM | POA: Diagnosis not present

## 2021-04-03 ENCOUNTER — Encounter: Payer: Self-pay | Admitting: Internal Medicine

## 2021-04-03 ENCOUNTER — Ambulatory Visit (INDEPENDENT_AMBULATORY_CARE_PROVIDER_SITE_OTHER): Payer: PPO | Admitting: Internal Medicine

## 2021-04-03 ENCOUNTER — Other Ambulatory Visit: Payer: Self-pay

## 2021-04-03 VITALS — BP 110/61 | HR 83 | Ht 66.0 in | Wt 154.7 lb

## 2021-04-03 DIAGNOSIS — I7300 Raynaud's syndrome without gangrene: Secondary | ICD-10-CM

## 2021-04-03 DIAGNOSIS — E038 Other specified hypothyroidism: Secondary | ICD-10-CM | POA: Diagnosis not present

## 2021-04-03 DIAGNOSIS — Z23 Encounter for immunization: Secondary | ICD-10-CM | POA: Diagnosis not present

## 2021-04-03 DIAGNOSIS — I10 Essential (primary) hypertension: Secondary | ICD-10-CM

## 2021-04-03 DIAGNOSIS — I5022 Chronic systolic (congestive) heart failure: Secondary | ICD-10-CM

## 2021-04-03 DIAGNOSIS — J449 Chronic obstructive pulmonary disease, unspecified: Secondary | ICD-10-CM | POA: Diagnosis not present

## 2021-04-03 DIAGNOSIS — I739 Peripheral vascular disease, unspecified: Secondary | ICD-10-CM | POA: Diagnosis not present

## 2021-04-03 DIAGNOSIS — E063 Autoimmune thyroiditis: Secondary | ICD-10-CM

## 2021-04-03 DIAGNOSIS — K2100 Gastro-esophageal reflux disease with esophagitis, without bleeding: Secondary | ICD-10-CM | POA: Diagnosis not present

## 2021-04-03 DIAGNOSIS — E785 Hyperlipidemia, unspecified: Secondary | ICD-10-CM | POA: Diagnosis not present

## 2021-04-03 MED ORDER — CLOPIDOGREL BISULFATE 75 MG PO TABS: 75.0000 mg | ORAL_TABLET | Freq: Every day | ORAL | 3 refills | Status: DC

## 2021-04-03 NOTE — Assessment & Plan Note (Signed)

## 2021-04-03 NOTE — Assessment & Plan Note (Signed)
Hypercholesterolemia  I advised the patient to follow Mediterranean diet This diet is rich in fruits vegetables and whole grain, and This diet is also rich in fish and lean meat Patient should also eat a handful of almonds or walnuts daily Recent heart study indicated that average follow-up on this kind of diet reduces the cardiovascular mortality by 50 to 70%== 

## 2021-04-03 NOTE — Assessment & Plan Note (Signed)
Stable at the present time. 

## 2021-04-03 NOTE — Assessment & Plan Note (Signed)
-   The patient's GERD is stable on medication.  - Instructed the patient to avoid eating spicy and acidic foods, as well as foods high in fat. - Instructed the patient to avoid eating large meals or meals 2-3 hours prior to sleeping. 

## 2021-04-03 NOTE — Assessment & Plan Note (Signed)
Stable

## 2021-04-03 NOTE — Progress Notes (Signed)
Established Patient Office Visit  Subjective:  Patient ID: Teresa Chambers, female    DOB: 01-21-1936  Age: 85 y.o. MRN: [name]  CC:  Chief Complaint  Patient presents with   Cellulitis    Patient is here for 1 month follow up for cellulitis f/u    HPI  Teresa Chambers presents for check up  Past Medical History:  Diagnosis Date   Actinic keratosis    Acute thoracic back pain    Asthma    Basal cell carcinoma    nose    Chest pain    unspecified   CHF (congestive heart failure) ([place]) 04/30/2017   Chronic abdominal pain 05/01/2017   unspecified   Congenital heart failure (HCC)    COPD (chronic obstructive pulmonary disease) ([place])    Disease of upper respiratory system 04/30/2017   Dyspnea on exertion    Esophageal reflux disease 04/30/2017   History of bone density study 06/28/2004   Leg swelling    Lumbar arthropathy    Nasopharyngitis 04/30/2017   Osteoarthropathy 04/30/2017   Osteoporosis 04/30/2017   Palpitations    Sinusitis, acute 04/30/2017   unspecified   Squamous cell carcinoma of skin 07/28/2017   Right medial knee. Hypertrophic SCCis. Tx: [name] 07/28/2017    Past Surgical History:  Procedure Laterality Date   CATARACT EXTRACTION     07/21/2001 - 07/20/2002   CHOLECYSTECTOMY     07/21/1997 - 07/20/1998   COLON SURGERY     COLONOSCOPY     05/05/2000, 01/17/2000 Adenomatous Polyps     COLONOSCOPY     11/05/2009, 12/23/2004, 07/07/2001   PH Adenomatous Polyps: CBF 10/2014; recall ltr mailed 09/18/2014 (dw)    ESOPHAGOGASTRODUODENOSCOPY     11/05/2009, 05/05/2000   FLEXIBLE SIGMOIDOSCOPY  11/28/1999   HAND SURGERY  2006   HERNIA REPAIR     HIP ARTHROPLASTY Right 02/11/2018   Procedure: ARTHROPLASTY BIPOLAR HIP (HEMIARTHROPLASTY);  Surgeon: Corky Mull, MD;  Location: [facility] ORS;  Service: Orthopedics;  Laterality: Right;   LAPAROSCOPIC COLON RESECTION     2001    Family History  Problem Relation Age of Onset   Heart disease Mother    Heart disease  Father    Hypertension Son    Hypertension Daughter     Social History   Socioeconomic History   Marital status: Divorced    Spouse name: Not on file   Number of children: 5   Years of education: 15.5   Highest education level: High school graduate  Occupational History   Occupation: retired  Tobacco Use   Smoking status: Never   Smokeless tobacco: Never  Scientific laboratory technician Use: Never used  Substance and Sexual Activity   Alcohol use: Not Currently   Drug use: Never   Sexual activity: Not Currently  Other Topics Concern   Not on file  Social History Narrative   Lives independently, has home at [facility] she enjoys traveling to   Social Determinants of Radio broadcast assistant Strain: Not on file  Food Insecurity: Not on file  Transportation Needs: Not on file  Physical Activity: Not on file  Stress: Not on file  Social Connections: Not on file  Intimate Partner Violence: Not on file     Current Outpatient Medications:    acetaminophen (TYLENOL) 325 MG tablet, Take 1-2 tablets (325-650 mg total) by mouth every 6 (six) hours as needed for mild pain (pain score 1-3 or temp > 100.5)., Disp: ,  Rfl:    Ascorbic Acid (VITAMIN C WITH ROSE HIPS) 1000 MG tablet, Take 1,000 mg by mouth daily., Disp: , Rfl:    BREO ELLIPTA 200-25 MCG/INH AEPB, Inhale 1 puff into the lungs daily., Disp: 3 each, Rfl: 3   Calcium Carbonate-Vitamin D 600-400 MG-UNIT tablet, Take 1 tablet by mouth daily., Disp: , Rfl:    clopidogrel (PLAVIX) 75 MG tablet, Take 1 tablet (75 mg total) by mouth daily., Disp: 90 tablet, Rfl: 3   ENTRESTO 24-26 MG, TAKE 1 TABLET BY MOUTH TWICE DAILY, Disp: 180 tablet, Rfl: 1   ezetimibe (ZETIA) 10 MG tablet, TAKE 1 TABLET(10 MG) BY MOUTH DAILY, Disp: 90 tablet, Rfl: 3   fluticasone (FLONASE) 50 MCG/ACT nasal spray, SHAKE LIQUID AND USE 2 SPRAYS IN EACH NOSTRIL DAILY, Disp: 48 g, Rfl: 6   levothyroxine (SYNTHROID) 50 MCG tablet, Take 1 tablet (50 mcg total) by mouth  daily., Disp: 90 tablet, Rfl: 3   montelukast (SINGULAIR) 10 MG tablet, Take 1 tablet (10 mg total) by mouth daily at 2 PM., Disp: 90 tablet, Rfl: 3   pantoprazole (PROTONIX) 40 MG tablet, Take 1 tablet (40 mg total) by mouth daily., Disp: 90 tablet, Rfl: 3   Allergies  Allergen Reactions   Codeine Nausea And Vomiting   Lidocaine Swelling and Anaphylaxis    ROS Review of Systems  Constitutional: Negative.   HENT: Negative.    Eyes: Negative.   Respiratory: Negative.    Cardiovascular: Negative.   Gastrointestinal: Negative.   Endocrine: Negative.   Genitourinary: Negative.   Musculoskeletal: Negative.   Skin: Negative.   Allergic/Immunologic: Negative.   Neurological: Negative.   Hematological: Negative.   Psychiatric/Behavioral: Negative.    All other systems reviewed and are negative.    Objective:    Physical Exam Vitals reviewed.  Constitutional:      Appearance: Normal appearance.  HENT:     Mouth/Throat:     Mouth: Mucous membranes are moist.  Eyes:     Pupils: Pupils are equal, round, and reactive to light.  Neck:     Vascular: No carotid bruit.  Cardiovascular:     Rate and Rhythm: Normal rate and regular rhythm.     Pulses: Normal pulses.     Heart sounds: Normal heart sounds.  Pulmonary:     Effort: Pulmonary effort is normal.     Breath sounds: Normal breath sounds.  Abdominal:     General: Bowel sounds are normal.     Palpations: Abdomen is soft. There is no hepatomegaly, splenomegaly or mass.     Tenderness: There is no abdominal tenderness.     Hernia: No hernia is present.  Musculoskeletal:        General: No tenderness.     Cervical back: Neck supple.     Right lower leg: No edema.     Left lower leg: No edema.  Skin:    Findings: No rash.  Neurological:     Mental Status: She is alert and oriented to person, place, and time.     Motor: No weakness.  Psychiatric:        Mood and Affect: Mood and affect normal.        Behavior: Behavior  normal.    BP 110/61   Pulse 83   Ht '5\' 6"'$  (1.676 m)   Wt 154 lb 11.2 oz (70.2 kg)   BMI 24.97 kg/m  Wt Readings from Last 3 Encounters:  04/03/21 154 lb 11.2 oz (70.2 kg)  03/04/21 150 lb 9.6 oz (68.3 kg)  02/20/21 156 lb 6.4 oz (70.9 kg)     Health Maintenance Due  Topic Date Due   Zoster Vaccines- Shingrix (1 of 2) Never done   PNA vac Low Risk Adult (2 of 2 - PPSV23) 07/01/2014   COVID-19 Vaccine (4 - Booster for Pfizer series) 08/02/2020    There are no preventive care reminders to display for this patient.  Lab Results  Component Value Date   TSH 4.11 06/04/2020   Lab Results  Component Value Date   WBC 6.5 06/04/2020   HGB 13.7 06/04/2020   HCT 40.8 06/04/2020   MCV 97.4 06/04/2020   PLT 271 06/04/2020   Lab Results  Component Value Date   NA 145 06/04/2020   K 4.4 06/04/2020   CO2 28 06/04/2020   GLUCOSE 87 06/04/2020   BUN 14 06/04/2020   CREATININE 0.64 06/04/2020   BILITOT 0.5 06/04/2020   ALKPHOS 48 03/29/2019   AST 13 06/04/2020   ALT 9 06/04/2020   PROT 6.3 06/04/2020   ALBUMIN 4.0 03/29/2019   CALCIUM 9.7 06/04/2020   ANIONGAP 9 03/29/2019   Lab Results  Component Value Date   CHOL 246 (H) 06/04/2020   Lab Results  Component Value Date   HDL 75 06/04/2020   Lab Results  Component Value Date   LDLCALC 151 (H) 06/04/2020   Lab Results  Component Value Date   TRIG 96 06/04/2020   Lab Results  Component Value Date   CHOLHDL 3.3 06/04/2020   No results found for: HGBA1C    Assessment & Plan:   Problem List Items Addressed This Visit       Cardiovascular and Mediastinum   Essential hypertension     Patient denies any chest pain or shortness of breath there is no history of palpitation or paroxysmal nocturnal dyspnea   patient was advised to follow low-salt low-cholesterol diet    ideally I want to keep systolic blood pressure below 130 mmHg, patient was asked to check blood pressure one times a week and give me a report  on that.  Patient will be follow-up in 3 months  or earlier as needed, patient will call me back for any change in the cardiovascular symptoms Patient was advised to buy a book from local bookstore concerning blood pressure and read several chapters  every day.  This will be supplemented by some of the material we will give him from the office.  Patient should also utilize other resources like YouTube and Internet to learn more about the blood pressure and the diet.      Chronic systolic (congestive) heart failure (HCC)    Stable at the present time      Raynaud's disease without gangrene     Respiratory   Chronic obstructive pulmonary disease (HCC)    Stable at the present time        Digestive   Chronic reflux esophagitis    - The patient's GERD is stable on medication.  - Instructed the patient to avoid eating spicy and acidic foods, as well as foods high in fat. - Instructed the patient to avoid eating large meals or meals 2-3 hours prior to sleeping.        Endocrine   Hypothyroidism due to Hashimoto's thyroiditis    Stable        Other   Dyslipidemia    Hypercholesterolemia  I advised the patient to follow Mediterranean diet This diet is  rich in fruits vegetables and whole grain, and This diet is also rich in fish and lean meat Patient should also eat a handful of almonds or walnuts daily Recent heart study indicated that average follow-up on this kind of diet reduces the cardiovascular mortality by 50 to 70%==      Other Visit Diagnoses     Need for influenza vaccination    -  Primary   Relevant Orders   Flu Vaccine QUAD High Dose(Fluad) (Completed)   Need for Tdap vaccination       Relevant Orders   Tdap vaccine greater than or equal to 7yo IM (Completed)   PAD (peripheral artery disease) (HCC)       Relevant Medications   clopidogrel (PLAVIX) 75 MG tablet       Meds ordered this encounter  Medications   clopidogrel (PLAVIX) 75 MG tablet    Sig:  Take 1 tablet (75 mg total) by mouth daily.    Dispense:  90 tablet    Refill:  3     Follow-up: No follow-ups on file.    Cletis Athens, MD

## 2021-05-10 ENCOUNTER — Other Ambulatory Visit: Payer: Self-pay | Admitting: Internal Medicine

## 2021-05-10 DIAGNOSIS — F339 Major depressive disorder, recurrent, unspecified: Secondary | ICD-10-CM

## 2021-05-26 ENCOUNTER — Other Ambulatory Visit: Payer: Self-pay | Admitting: Internal Medicine

## 2021-05-26 DIAGNOSIS — E038 Other specified hypothyroidism: Secondary | ICD-10-CM

## 2021-05-27 DIAGNOSIS — M170 Bilateral primary osteoarthritis of knee: Secondary | ICD-10-CM | POA: Diagnosis not present

## 2021-05-27 DIAGNOSIS — M5416 Radiculopathy, lumbar region: Secondary | ICD-10-CM | POA: Diagnosis not present

## 2021-05-27 DIAGNOSIS — M47816 Spondylosis without myelopathy or radiculopathy, lumbar region: Secondary | ICD-10-CM | POA: Diagnosis not present

## 2021-05-27 DIAGNOSIS — M5136 Other intervertebral disc degeneration, lumbar region: Secondary | ICD-10-CM | POA: Diagnosis not present

## 2021-05-27 DIAGNOSIS — M48062 Spinal stenosis, lumbar region with neurogenic claudication: Secondary | ICD-10-CM | POA: Diagnosis not present

## 2021-06-03 ENCOUNTER — Encounter: Payer: Self-pay | Admitting: Internal Medicine

## 2021-06-03 ENCOUNTER — Ambulatory Visit (INDEPENDENT_AMBULATORY_CARE_PROVIDER_SITE_OTHER): Payer: PPO | Admitting: Internal Medicine

## 2021-06-03 ENCOUNTER — Other Ambulatory Visit: Payer: Self-pay

## 2021-06-03 VITALS — BP 121/72 | HR 97 | Ht 66.0 in | Wt 155.4 lb

## 2021-06-03 DIAGNOSIS — E038 Other specified hypothyroidism: Secondary | ICD-10-CM

## 2021-06-03 DIAGNOSIS — I10 Essential (primary) hypertension: Secondary | ICD-10-CM

## 2021-06-03 DIAGNOSIS — K5909 Other constipation: Secondary | ICD-10-CM

## 2021-06-03 DIAGNOSIS — E063 Autoimmune thyroiditis: Secondary | ICD-10-CM

## 2021-06-03 DIAGNOSIS — H04123 Dry eye syndrome of bilateral lacrimal glands: Secondary | ICD-10-CM | POA: Diagnosis not present

## 2021-06-03 DIAGNOSIS — J449 Chronic obstructive pulmonary disease, unspecified: Secondary | ICD-10-CM | POA: Diagnosis not present

## 2021-06-03 DIAGNOSIS — K219 Gastro-esophageal reflux disease without esophagitis: Secondary | ICD-10-CM

## 2021-06-03 NOTE — Progress Notes (Signed)
Established Patient Office Visit  Subjective:  Patient ID: Teresa Chambers, female    DOB: May 06, 1936  Age: 85 y.o. MRN: [id number]  CC:  Chief Complaint  Patient presents with   Follow-up    HPI  Teresa Chambers presents for check up, c/o  lt knee pain  Past Medical History:  Diagnosis Date   Actinic keratosis    Acute thoracic back pain    Asthma    Basal cell carcinoma    nose    Chest pain    unspecified   CHF (congestive heart failure) ([place]) 04/30/2017   Chronic abdominal pain 05/01/2017   unspecified   Congenital heart failure (HCC)    COPD (chronic obstructive pulmonary disease) ([place])    Disease of upper respiratory system 04/30/2017   Dyspnea on exertion    Esophageal reflux disease 04/30/2017   History of bone density study 06/28/2004   Leg swelling    Lumbar arthropathy    Nasopharyngitis 04/30/2017   Osteoarthropathy 04/30/2017   Osteoporosis 04/30/2017   Palpitations    Sinusitis, acute 04/30/2017   unspecified   Squamous cell carcinoma of skin 07/28/2017   Right medial knee. Hypertrophic SCCis. Tx: [facility] 07/28/2017    Past Surgical History:  Procedure Laterality Date   CATARACT EXTRACTION     07/21/2001 - 07/20/2002   CHOLECYSTECTOMY     07/21/1997 - 07/20/1998   COLON SURGERY     COLONOSCOPY     05/05/2000, 01/17/2000 Adenomatous Polyps     COLONOSCOPY     11/05/2009, 12/23/2004, 07/07/2001   PH Adenomatous Polyps: CBF 10/2014; recall ltr mailed 09/18/2014 (dw)    ESOPHAGOGASTRODUODENOSCOPY     11/05/2009, 05/05/2000   FLEXIBLE SIGMOIDOSCOPY  11/28/1999   HAND SURGERY  2006   HERNIA REPAIR     HIP ARTHROPLASTY Right 02/11/2018   Procedure: ARTHROPLASTY BIPOLAR HIP (HEMIARTHROPLASTY);  Surgeon: Corky Mull, MD;  Location: [facility] ORS;  Service: Orthopedics;  Laterality: Right;   LAPAROSCOPIC COLON RESECTION     2001    Family History  Problem Relation Age of Onset   Heart disease Mother    Heart disease Father    Hypertension Son    Hypertension  Daughter     Social History   Socioeconomic History   Marital status: Divorced    Spouse name: Not on file   Number of children: 5   Years of education: 15.5   Highest education level: High school graduate  Occupational History   Occupation: retired  Tobacco Use   Smoking status: Never   Smokeless tobacco: Never  Scientific laboratory technician Use: Never used  Substance and Sexual Activity   Alcohol use: Not Currently   Drug use: Never   Sexual activity: Not Currently  Other Topics Concern   Not on file  Social History Narrative   Lives independently, has home at [facility] she enjoys traveling to   Social Determinants of Radio broadcast assistant Strain: Not on file  Food Insecurity: Not on file  Transportation Needs: Not on file  Physical Activity: Not on file  Stress: Not on file  Social Connections: Not on file  Intimate Partner Violence: Not on file     Current Outpatient Medications:    acetaminophen (TYLENOL) 325 MG tablet, Take 1-2 tablets (325-650 mg total) by mouth every 6 (six) hours as needed for mild pain (pain score 1-3 or temp > 100.5)., Disp: , Rfl:    Ascorbic Acid (VITAMIN C WITH  ROSE HIPS) 1000 MG tablet, Take 1,000 mg by mouth daily., Disp: , Rfl:    BREO ELLIPTA 200-25 MCG/INH AEPB, Inhale 1 puff into the lungs daily., Disp: 3 each, Rfl: 3   Calcium Carbonate-Vitamin D 600-400 MG-UNIT tablet, Take 1 tablet by mouth daily., Disp: , Rfl:    clopidogrel (PLAVIX) 75 MG tablet, Take 1 tablet (75 mg total) by mouth daily., Disp: 90 tablet, Rfl: 3   DULoxetine (CYMBALTA) 60 MG capsule, TAKE 1 CAPSULE BY MOUTH TWICE DAILY, Disp: 60 capsule, Rfl: 3   ENTRESTO 24-26 MG, TAKE 1 TABLET BY MOUTH TWICE DAILY, Disp: 180 tablet, Rfl: 1   ezetimibe (ZETIA) 10 MG tablet, TAKE 1 TABLET(10 MG) BY MOUTH DAILY, Disp: 90 tablet, Rfl: 3   fluticasone (FLONASE) 50 MCG/ACT nasal spray, SHAKE LIQUID AND USE 2 SPRAYS IN EACH NOSTRIL DAILY, Disp: 48 g, Rfl: 6   levothyroxine  (SYNTHROID) 50 MCG tablet, TAKE 1 TABLET(50 MCG) BY MOUTH DAILY, Disp: 90 tablet, Rfl: 3   montelukast (SINGULAIR) 10 MG tablet, Take 1 tablet (10 mg total) by mouth daily at 2 PM., Disp: 90 tablet, Rfl: 3   pantoprazole (PROTONIX) 40 MG tablet, Take 1 tablet (40 mg total) by mouth daily., Disp: 90 tablet, Rfl: 3   Allergies  Allergen Reactions   Codeine Nausea And Vomiting   Lidocaine Swelling and Anaphylaxis    ROS Review of Systems  Constitutional: Negative.   HENT: Negative.    Eyes: Negative.   Respiratory: Negative.    Cardiovascular: Negative.   Gastrointestinal: Negative.   Endocrine: Negative.   Genitourinary: Negative.   Musculoskeletal: Negative.   Skin: Negative.   Allergic/Immunologic: Negative.   Neurological: Negative.   Hematological: Negative.   Psychiatric/Behavioral: Negative.    All other systems reviewed and are negative.    Objective:    Physical Exam Vitals reviewed.  Constitutional:      Appearance: Normal appearance.  HENT:     Mouth/Throat:     Mouth: Mucous membranes are moist.  Eyes:     Pupils: Pupils are equal, round, and reactive to light.  Neck:     Vascular: No carotid bruit.  Cardiovascular:     Rate and Rhythm: Normal rate and regular rhythm.     Pulses: Normal pulses.     Heart sounds: Normal heart sounds.  Pulmonary:     Effort: Pulmonary effort is normal.     Breath sounds: Normal breath sounds.  Abdominal:     General: Bowel sounds are normal.     Palpations: Abdomen is soft. There is no hepatomegaly, splenomegaly or mass.     Tenderness: There is no abdominal tenderness.     Hernia: No hernia is present.  Musculoskeletal:        General: No tenderness.     Cervical back: Neck supple.     Right lower leg: No edema.     Left lower leg: No edema.  Skin:    Findings: No rash.  Neurological:     Mental Status: She is alert and oriented to person, place, and time.     Motor: No weakness.  Psychiatric:        Mood and  Affect: Mood and affect normal.        Behavior: Behavior normal.    BP 121/72   Pulse 97   Ht 5\' 6"  (1.676 m)   Wt 155 lb 6.4 oz (70.5 kg)   BMI 25.08 kg/m  Wt Readings from Last 3 Encounters:  06/03/21 155 lb 6.4 oz (70.5 kg)  04/03/21 154 lb 11.2 oz (70.2 kg)  03/04/21 150 lb 9.6 oz (68.3 kg)     Health Maintenance Due  Topic Date Due   Zoster Vaccines- Shingrix (1 of 2) Never done   Pneumonia Vaccine 76+ Years old (2 - PPSV23 if available, else PCV20) 07/01/2014   COVID-19 Vaccine (4 - Booster for Pfizer series) 07/05/2020    There are no preventive care reminders to display for this patient.  Lab Results  Component Value Date   TSH 4.11 06/04/2020   Lab Results  Component Value Date   WBC 6.5 06/04/2020   HGB 13.7 06/04/2020   HCT 40.8 06/04/2020   MCV 97.4 06/04/2020   PLT 271 06/04/2020   Lab Results  Component Value Date   NA 145 06/04/2020   K 4.4 06/04/2020   CO2 28 06/04/2020   GLUCOSE 87 06/04/2020   BUN 14 06/04/2020   CREATININE 0.64 06/04/2020   BILITOT 0.5 06/04/2020   ALKPHOS 48 03/29/2019   AST 13 06/04/2020   ALT 9 06/04/2020   PROT 6.3 06/04/2020   ALBUMIN 4.0 03/29/2019   CALCIUM 9.7 06/04/2020   ANIONGAP 9 03/29/2019   Lab Results  Component Value Date   CHOL 246 (H) 06/04/2020   Lab Results  Component Value Date   HDL 75 06/04/2020   Lab Results  Component Value Date   LDLCALC 151 (H) 06/04/2020   Lab Results  Component Value Date   TRIG 96 06/04/2020   Lab Results  Component Value Date   CHOLHDL 3.3 06/04/2020   No results found for: HGBA1C    Assessment & Plan:  Patient was advised to use Voltaren gel twice a day Problem List Items Addressed This Visit       Cardiovascular and Mediastinum   Essential hypertension - Primary    Blood pressure is stable on today's examination        Respiratory   Chronic obstructive pulmonary disease (HCC)    COPD stable patient does not smoke        Digestive    Chronic constipation    Patient was advised to increase fiber in the diet drink a lot of water      GERD without esophagitis    - The patient's GERD is stable on medication.  - Instructed the patient to avoid eating spicy and acidic foods, as well as foods high in fat. - Instructed the patient to avoid eating large meals or meals 2-3 hours prior to sleeping.        Endocrine   Hypothyroidism due to Hashimoto's thyroiditis    Stable on medication.  Patient was advised to take thyroid medication in the morning before breakfast        Other   Bilateral dry eyes    Patient was advised to use artificial tears twice a day       No orders of the defined types were placed in this encounter.   Follow-up: No follow-ups on file.    Cletis Athens, MD

## 2021-06-04 ENCOUNTER — Other Ambulatory Visit: Payer: PPO

## 2021-06-04 DIAGNOSIS — Z961 Presence of intraocular lens: Secondary | ICD-10-CM | POA: Diagnosis not present

## 2021-06-04 DIAGNOSIS — Z0000 Encounter for general adult medical examination without abnormal findings: Secondary | ICD-10-CM

## 2021-06-04 NOTE — Assessment & Plan Note (Signed)
COPD stable patient does not smoke

## 2021-06-04 NOTE — Assessment & Plan Note (Signed)
Patient was advised to increase fiber in the diet drink a lot of water

## 2021-06-04 NOTE — Progress Notes (Signed)
Established Patient Office Visit  Subjective:  Patient ID: Teresa Chambers, female    DOB: 24-Sep-1935  Age: 85 y.o. MRN: [id number]  CC: No chief complaint on file.   HPI  Teresa Chambers presents for general check up  Past Medical History:  Diagnosis Date  . Actinic keratosis   . Acute thoracic back pain   . Asthma   . Basal cell carcinoma    nose   . Chest pain    unspecified  . CHF (congestive heart failure) ([place]) 04/30/2017  . Chronic abdominal pain 05/01/2017   unspecified  . Congenital heart failure ([place])   . COPD (chronic obstructive pulmonary disease) ([place])   . Disease of upper respiratory system 04/30/2017  . Dyspnea on exertion   . Esophageal reflux disease 04/30/2017  . History of bone density study 06/28/2004  . Leg swelling   . Lumbar arthropathy   . Nasopharyngitis 04/30/2017  . Osteoarthropathy 04/30/2017  . Osteoporosis 04/30/2017  . Palpitations   . Sinusitis, acute 04/30/2017   unspecified  . Squamous cell carcinoma of skin 07/28/2017   Right medial knee. Hypertrophic SCCis. Tx: [facility] 07/28/2017    Past Surgical History:  Procedure Laterality Date  . CATARACT EXTRACTION     07/21/2001 - 07/20/2002  . CHOLECYSTECTOMY     07/21/1997 - 07/20/1998  . COLON SURGERY    . COLONOSCOPY     05/05/2000, 01/17/2000 Adenomatous Polyps    . COLONOSCOPY     11/05/2009, 12/23/2004, 07/07/2001   PH Adenomatous Polyps: CBF 10/2014; recall ltr mailed 09/18/2014 (dw)   . ESOPHAGOGASTRODUODENOSCOPY     11/05/2009, 05/05/2000  . FLEXIBLE SIGMOIDOSCOPY  11/28/1999  . HAND SURGERY  2006  . HERNIA REPAIR    . HIP ARTHROPLASTY Right 02/11/2018   Procedure: ARTHROPLASTY BIPOLAR HIP (HEMIARTHROPLASTY);  Surgeon: Corky Mull, MD;  Location: [facility] ORS;  Service: Orthopedics;  Laterality: Right;  . LAPAROSCOPIC COLON RESECTION     2001    Family History  Problem Relation Age of Onset  . Heart disease Mother   . Heart disease Father   . Hypertension Son   . Hypertension  Daughter     Social History   Socioeconomic History  . Marital status: Divorced    Spouse name: Not on file  . Number of children: 5  . Years of education: 15.5  . Highest education level: High school graduate  Occupational History  . Occupation: retired  Tobacco Use  . Smoking status: Never  . Smokeless tobacco: Never  Vaping Use  . Vaping Use: Never used  Substance and Sexual Activity  . Alcohol use: Not Currently  . Drug use: Never  . Sexual activity: Not Currently  Other Topics Concern  . Not on file  Social History Narrative   Lives independently, has home at [facility] she enjoys traveling to   Social Determinants of Health   Financial Resource Strain: Not on file  Food Insecurity: Not on file  Transportation Needs: Not on file  Physical Activity: Not on file  Stress: Not on file  Social Connections: Not on file  Intimate Partner Violence: Not on file     Current Outpatient Medications:  .  acetaminophen (TYLENOL) 325 MG tablet, Take 1-2 tablets (325-650 mg total) by mouth every 6 (six) hours as needed for mild pain (pain score 1-3 or temp > 100.5)., Disp: , Rfl:  .  Ascorbic Acid (VITAMIN C WITH ROSE HIPS) 1000 MG tablet, Take 1,000 mg by mouth  daily., Disp: , Rfl:  .  BREO ELLIPTA 200-25 MCG/INH AEPB, Inhale 1 puff into the lungs daily., Disp: 3 each, Rfl: 3 .  Calcium Carbonate-Vitamin D 600-400 MG-UNIT tablet, Take 1 tablet by mouth daily., Disp: , Rfl:  .  clopidogrel (PLAVIX) 75 MG tablet, Take 1 tablet (75 mg total) by mouth daily., Disp: 90 tablet, Rfl: 3 .  DULoxetine (CYMBALTA) 60 MG capsule, TAKE 1 CAPSULE BY MOUTH TWICE DAILY, Disp: 60 capsule, Rfl: 3 .  ENTRESTO 24-26 MG, TAKE 1 TABLET BY MOUTH TWICE DAILY, Disp: 180 tablet, Rfl: 1 .  ezetimibe (ZETIA) 10 MG tablet, TAKE 1 TABLET(10 MG) BY MOUTH DAILY, Disp: 90 tablet, Rfl: 3 .  fluticasone (FLONASE) 50 MCG/ACT nasal spray, SHAKE LIQUID AND USE 2 SPRAYS IN EACH NOSTRIL DAILY, Disp: 48 g, Rfl: 6 .   levothyroxine (SYNTHROID) 50 MCG tablet, TAKE 1 TABLET(50 MCG) BY MOUTH DAILY, Disp: 90 tablet, Rfl: 3 .  montelukast (SINGULAIR) 10 MG tablet, Take 1 tablet (10 mg total) by mouth daily at 2 PM., Disp: 90 tablet, Rfl: 3 .  pantoprazole (PROTONIX) 40 MG tablet, Take 1 tablet (40 mg total) by mouth daily., Disp: 90 tablet, Rfl: 3   Allergies  Allergen Reactions  . Codeine Nausea And Vomiting  . Lidocaine Swelling and Anaphylaxis    ROS Review of Systems  Respiratory:  Positive for shortness of breath. Negative for cough and chest tightness.   Cardiovascular:  Negative for chest pain and leg swelling.  Gastrointestinal:  Negative for abdominal pain.  Endocrine: Negative for polydipsia.  Genitourinary:  Negative for dysuria.  Neurological:  Negative for speech difficulty.  Hematological:  Negative for adenopathy.  Psychiatric/Behavioral:  Negative for agitation.      Objective:    Physical Exam Vitals reviewed.  Constitutional:      Appearance: Normal appearance.  HENT:     Mouth/Throat:     Mouth: Mucous membranes are moist.  Eyes:     Pupils: Pupils are equal, round, and reactive to light.  Neck:     Vascular: No carotid bruit.  Cardiovascular:     Rate and Rhythm: Normal rate and regular rhythm.     Pulses: Normal pulses.     Heart sounds: Normal heart sounds.  Pulmonary:     Effort: Pulmonary effort is normal.     Breath sounds: Normal breath sounds.  Abdominal:     General: Bowel sounds are normal.     Palpations: Abdomen is soft. There is no hepatomegaly, splenomegaly or mass.     Tenderness: There is no abdominal tenderness.     Hernia: No hernia is present.  Musculoskeletal:        General: No tenderness.     Cervical back: Neck supple.     Right lower leg: No edema.     Left lower leg: No edema.  Skin:    Findings: No rash.  Neurological:     Mental Status: She is alert and oriented to person, place, and time.     Motor: No weakness.  Psychiatric:         Mood and Affect: Mood and affect normal.        Behavior: Behavior normal.    There were no vitals taken for this visit. Wt Readings from Last 3 Encounters:  06/03/21 155 lb 6.4 oz (70.5 kg)  04/03/21 154 lb 11.2 oz (70.2 kg)  03/04/21 150 lb 9.6 oz (68.3 kg)     Health Maintenance Due  Topic  Date Due  . Zoster Vaccines- Shingrix (1 of 2) Never done  . Pneumonia Vaccine 24+ Years old (2 - PPSV23 if available, else PCV20) 07/01/2014  . COVID-19 Vaccine (4 - Booster for Pfizer series) 07/05/2020    There are no preventive care reminders to display for this patient.  Lab Results  Component Value Date   TSH 4.11 06/04/2020   Lab Results  Component Value Date   WBC 6.5 06/04/2020   HGB 13.7 06/04/2020   HCT 40.8 06/04/2020   MCV 97.4 06/04/2020   PLT 271 06/04/2020   Lab Results  Component Value Date   NA 145 06/04/2020   K 4.4 06/04/2020   CO2 28 06/04/2020   GLUCOSE 87 06/04/2020   BUN 14 06/04/2020   CREATININE 0.64 06/04/2020   BILITOT 0.5 06/04/2020   ALKPHOS 48 03/29/2019   AST 13 06/04/2020   ALT 9 06/04/2020   PROT 6.3 06/04/2020   ALBUMIN 4.0 03/29/2019   CALCIUM 9.7 06/04/2020   ANIONGAP 9 03/29/2019   Lab Results  Component Value Date   CHOL 246 (H) 06/04/2020   Lab Results  Component Value Date   HDL 75 06/04/2020   Lab Results  Component Value Date   LDLCALC 151 (H) 06/04/2020   Lab Results  Component Value Date   TRIG 96 06/04/2020   Lab Results  Component Value Date   CHOLHDL 3.3 06/04/2020   No results found for: HGBA1C    Assessment & Plan:   Problem List Items Addressed This Visit   None Visit Diagnoses     Encounter for Medicare annual wellness exam    -  Primary       No orders of the defined types were placed in this encounter.   Follow-up: No follow-ups on file.    Cletis Athens, MD

## 2021-06-04 NOTE — Assessment & Plan Note (Signed)
-   The patient's GERD is stable on medication.  - Instructed the patient to avoid eating spicy and acidic foods, as well as foods high in fat. - Instructed the patient to avoid eating large meals or meals 2-3 hours prior to sleeping. 

## 2021-06-04 NOTE — Assessment & Plan Note (Signed)
Stable on medication.  Patient was advised to take thyroid medication in the morning before breakfast

## 2021-06-04 NOTE — Assessment & Plan Note (Signed)
Blood pressure is stable on today's examination

## 2021-06-04 NOTE — Assessment & Plan Note (Signed)
Patient was advised to use artificial tears twice a day

## 2021-06-05 LAB — COMPLETE METABOLIC PANEL WITH GFR
AG Ratio: 1.9 (calc) (ref 1.0–2.5)
ALT: 17 U/L (ref 6–29)
AST: 12 U/L (ref 10–35)
Albumin: 3.9 g/dL (ref 3.6–5.1)
Alkaline phosphatase (APISO): 42 U/L (ref 37–153)
BUN: 16 mg/dL (ref 7–25)
CO2: 30 mmol/L (ref 20–32)
Calcium: 9.6 mg/dL (ref 8.6–10.4)
Chloride: 104 mmol/L (ref 98–110)
Creat: 0.65 mg/dL (ref 0.60–0.95)
Globulin: 2.1 g/dL (calc) (ref 1.9–3.7)
Glucose, Bld: 94 mg/dL (ref 65–99)
Potassium: 4.3 mmol/L (ref 3.5–5.3)
Sodium: 142 mmol/L (ref 135–146)
Total Bilirubin: 0.6 mg/dL (ref 0.2–1.2)
Total Protein: 6 g/dL — ABNORMAL LOW (ref 6.1–8.1)
eGFR: 87 mL/min/{1.73_m2} (ref >=60)

## 2021-06-05 LAB — LIPID PANEL
Cholesterol: 196 mg/dL (ref <200)
HDL: 70 mg/dL (ref >=50)
LDL Cholesterol (Calc): 107 mg/dL (calc) — ABNORMAL HIGH
Non-HDL Cholesterol (Calc): 126 mg/dL (calc) (ref <130)
Total CHOL/HDL Ratio: 2.8 (calc) (ref <5.0)
Triglycerides: 97 mg/dL (ref <150)

## 2021-06-05 LAB — CBC WITH DIFFERENTIAL/PLATELET
Absolute Monocytes: 490 cells/uL (ref 200–950)
Basophils Absolute: 17 cells/uL (ref 0–200)
Basophils Relative: 0.2 %
Eosinophils Absolute: 149 cells/uL (ref 15–500)
Eosinophils Relative: 1.8 %
HCT: 42 % (ref 35.0–45.0)
Hemoglobin: 14.1 g/dL (ref 11.7–15.5)
Lymphs Abs: 2673 cells/uL (ref 850–3900)
MCH: 32.6 pg (ref 27.0–33.0)
MCHC: 33.6 g/dL (ref 32.0–36.0)
MCV: 97.2 fL (ref 80.0–100.0)
MPV: 10.6 fL (ref 7.5–12.5)
Monocytes Relative: 5.9 %
Neutro Abs: 4972 cells/uL (ref 1500–7800)
Neutrophils Relative %: 59.9 %
Platelets: 318 10*3/uL (ref 140–400)
RBC: 4.32 10*6/uL (ref 3.80–5.10)
RDW: 12.3 % (ref 11.0–15.0)
Total Lymphocyte: 32.2 %
WBC: 8.3 10*3/uL (ref 3.8–10.8)

## 2021-06-05 LAB — TSH: TSH: 1.73 mIU/L (ref 0.40–4.50)

## 2021-06-07 ENCOUNTER — Other Ambulatory Visit: Payer: Self-pay | Admitting: *Deleted

## 2021-06-07 ENCOUNTER — Ambulatory Visit (INDEPENDENT_AMBULATORY_CARE_PROVIDER_SITE_OTHER): Payer: PPO | Admitting: *Deleted

## 2021-06-07 ENCOUNTER — Ambulatory Visit (INDEPENDENT_AMBULATORY_CARE_PROVIDER_SITE_OTHER): Payer: PPO | Admitting: Vascular Surgery

## 2021-06-07 ENCOUNTER — Other Ambulatory Visit: Payer: Self-pay

## 2021-06-07 VITALS — BP 117/79 | HR 93 | Ht 66.0 in | Wt 153.0 lb

## 2021-06-07 DIAGNOSIS — I890 Lymphedema, not elsewhere classified: Secondary | ICD-10-CM

## 2021-06-07 DIAGNOSIS — F339 Major depressive disorder, recurrent, unspecified: Secondary | ICD-10-CM

## 2021-06-07 DIAGNOSIS — I5022 Chronic systolic (congestive) heart failure: Secondary | ICD-10-CM | POA: Diagnosis not present

## 2021-06-07 DIAGNOSIS — I5082 Biventricular heart failure: Secondary | ICD-10-CM

## 2021-06-07 DIAGNOSIS — I872 Venous insufficiency (chronic) (peripheral): Secondary | ICD-10-CM | POA: Diagnosis not present

## 2021-06-07 DIAGNOSIS — I10 Essential (primary) hypertension: Secondary | ICD-10-CM

## 2021-06-07 DIAGNOSIS — Z0000 Encounter for general adult medical examination without abnormal findings: Secondary | ICD-10-CM | POA: Diagnosis not present

## 2021-06-07 DIAGNOSIS — J45909 Unspecified asthma, uncomplicated: Secondary | ICD-10-CM

## 2021-06-07 MED ORDER — DULOXETINE HCL 60 MG PO CPEP: 60.0000 mg | ORAL_CAPSULE | Freq: Two times a day (BID) | ORAL | 3 refills | Status: DC

## 2021-06-07 MED ORDER — MONTELUKAST SODIUM 10 MG PO TABS: 10.0000 mg | ORAL_TABLET | Freq: Every day | ORAL | 3 refills | Status: DC

## 2021-06-07 MED ORDER — ZOSTER VAC RECOMB ADJUVANTED 50 MCG/0.5ML IM SUSR: 0.5000 mL | Freq: Once | INTRAMUSCULAR | 1 refills | Status: AC

## 2021-06-07 NOTE — Assessment & Plan Note (Signed)
Still has discoloration in her feet.  Her pain is largely around her knees and likely from arthritic pain.  She does not have swelling.  No role for intervention at this time. Recheck in one year

## 2021-06-07 NOTE — Assessment & Plan Note (Signed)
blood pressure control important in reducing the progression of atherosclerotic disease. On appropriate oral medications.  

## 2021-06-07 NOTE — Progress Notes (Signed)
MRN : [id number]  Teresa Chambers is a 85 y.o. (1935/09/15) female who presents with chief complaint of  Chief Complaint  Patient presents with   Follow-up    6 mo no studies  .  History of Present Illness: Patient returns today in follow up of her lymphedema and venous insufficiency.  Her swelling is essentially resolved at this point.  She still elevates her legs.  She is increased her activity.  She is overall doing pretty well.  She still has some pain mostly in and around her knees and radiating down to the pretibial area.  She describes no worsening after she has been on her feet for long periods of time.  She says it is often worse in the morning.  No real swelling at this point.  No ulceration or infection.  No fever or chills  Current Outpatient Medications  Medication Sig Dispense Refill   acetaminophen (TYLENOL) 325 MG tablet Take 1-2 tablets (325-650 mg total) by mouth every 6 (six) hours as needed for mild pain (pain score 1-3 or temp > 100.5).     Ascorbic Acid (VITAMIN C WITH ROSE HIPS) 1000 MG tablet Take 1,000 mg by mouth daily.     BREO ELLIPTA 200-25 MCG/INH AEPB Inhale 1 puff into the lungs daily. 3 each 3   Calcium Carbonate-Vitamin D 600-400 MG-UNIT tablet Take 1 tablet by mouth daily.     clopidogrel (PLAVIX) 75 MG tablet Take 1 tablet (75 mg total) by mouth daily. 90 tablet 3   DULoxetine (CYMBALTA) 60 MG capsule TAKE 1 CAPSULE BY MOUTH TWICE DAILY 60 capsule 3   ENTRESTO 24-26 MG TAKE 1 TABLET BY MOUTH TWICE DAILY 180 tablet 1   ezetimibe (ZETIA) 10 MG tablet TAKE 1 TABLET(10 MG) BY MOUTH DAILY 90 tablet 3   fluticasone (FLONASE) 50 MCG/ACT nasal spray SHAKE LIQUID AND USE 2 SPRAYS IN EACH NOSTRIL DAILY 48 g 6   levothyroxine (SYNTHROID) 50 MCG tablet TAKE 1 TABLET(50 MCG) BY MOUTH DAILY 90 tablet 3   montelukast (SINGULAIR) 10 MG tablet Take 1 tablet (10 mg total) by mouth daily at 2 PM. 90 tablet 3   pantoprazole (PROTONIX) 40 MG tablet Take 1 tablet (40 mg total)  by mouth daily. 90 tablet 3   traMADol (ULTRAM) 50 MG tablet      No current facility-administered medications for this visit.    Past Medical History:  Diagnosis Date   Actinic keratosis    Acute thoracic back pain    Asthma    Basal cell carcinoma    nose    Chest pain    unspecified   CHF (congestive heart failure) (HCC) 04/30/2017   Chronic abdominal pain 05/01/2017   unspecified   Congenital heart failure (HCC)    COPD (chronic obstructive pulmonary disease) (HCC)    Disease of upper respiratory system 04/30/2017   Dyspnea on exertion    Esophageal reflux disease 04/30/2017   History of bone density study 06/28/2004   Leg swelling    Lumbar arthropathy    Nasopharyngitis 04/30/2017   Osteoarthropathy 04/30/2017   Osteoporosis 04/30/2017   Palpitations    Sinusitis, acute 04/30/2017   unspecified   Squamous cell carcinoma of skin 07/28/2017   Right medial knee. Hypertrophic SCCis. Tx: [facility] 07/28/2017    Past Surgical History:  Procedure Laterality Date   CATARACT EXTRACTION     07/21/2001 - 07/20/2002   CHOLECYSTECTOMY     07/21/1997 - 07/20/1998   COLON  SURGERY     COLONOSCOPY     05/05/2000, 01/17/2000 Adenomatous Polyps     COLONOSCOPY     11/05/2009, 12/23/2004, 07/07/2001   PH Adenomatous Polyps: [facility]BF 10/2014; recall ltr mailed 09/18/2014 (dw)    ESOPHAGOGASTRODUODENOSCOPY     11/05/2009, 05/05/2000   FLEXIBLE SIGMOIDOSCOPY  11/28/1999   HAND SURGERY  2006   HERNIA REPAIR     HIP ARTHROPLASTY Right 02/11/2018   Procedure: ARTHROPLASTY BIPOLAR HIP (HEMIARTHROPLASTY);  Surgeon: Corky Mull, MD;  Location: [facility] ORS;  Service: Orthopedics;  Laterality: Right;   LAPAROSCOPIC COLON RESECTION     2001     Social History   Tobacco Use   Smoking status: Never   Smokeless tobacco: Never  Vaping Use   Vaping Use: Never used  Substance Use Topics   Alcohol use: Not Currently   Drug use: Never      Family History  Problem Relation Age of Onset   Heart  disease Mother    Heart disease Father    Hypertension Son    Hypertension Daughter      Allergies  Allergen Reactions   Codeine Nausea And Vomiting   Lidocaine Swelling and Anaphylaxis    REVIEW OF SYSTEMS (Negative unless checked)   Constitutional: _0 Weight loss  _1 Fever  _2 Chills Cardiac: _3 Chest pain   _4 Chest pressure   _5 Palpitations   _6 Shortness of breath when laying flat   _7 Shortness of breath at rest   _8 Shortness of breath with exertion. Vascular:  _9 Pain in legs with walking   _10 Pain in legs at rest   _11 Pain in legs when laying flat   _12 Claudication   _13 Pain in feet when walking  _14 Pain in feet at rest  _15 Pain in feet when laying flat   _16 History of DVT   _17 Phlebitis   _18 Swelling in legs   _19 Varicose veins   _20 Non-healing ulcers Pulmonary:   _21 Uses home oxygen   _22 Productive cough   _23 Hemoptysis   _24 Wheeze  _25 COPD   _26 Asthma Neurologic:  _27 Dizziness  _28 Blackouts   _29 Seizures   _30 History of stroke   _31 History of TIA  _32 Aphasia   _33 Temporary blindness   _34 Dysphagia   _35 Weakness or numbness in arms   _36 Weakness or numbness in legs Musculoskeletal:  _37 Arthritis   _38 Joint swelling   _39 Joint pain   _40 Low back pain Hematologic:  _41 Easy bruising  _42 Easy bleeding   _43 Hypercoagulable state   _44 Anemic  _45 Hepatitis Gastrointestinal:  _46 Blood in stool   _47 Vomiting blood  _48 Gastroesophageal reflux/heartburn   _49 Abdominal pain Genitourinary:  _50 Chronic kidney disease   _51 Difficult urination  _52 Frequent urination  _53 Burning with urination   _54 Hematuria Skin:  _55 Rashes   _56 Ulcers   _57 Wounds Psychological:  _58 History of anxiety   _59  History of major depression.  Physical Examination  BP 117/79   Pulse 93   Ht _60  (1.676 m)   Wt 153 lb (69.4 kg)   BMI 24.69 kg/m  Gen:  WD/WN, NAD Head: [place]/AT, No temporalis wasting. Ear/Nose/Throat: Hearing grossly intact, nares w/o erythema or drainage Eyes: Conjunctiva clear. Sclera non-icteric Neck: Supple.  Trachea midline Pulmonary:   Good air movement, no use of accessory muscles.  Cardiac: RRR, no JVD Vascular:  Vessel Right Left  Radial Palpable Palpable                          PT Palpable Palpable  DP Palpable Palpable   Gastrointestinal: soft, non-tender/non-distended. No guarding/reflex.  Musculoskeletal: M/S 5/5 throughout.  No  deformity or atrophy.  Mild purplish discoloration of the right foot and ankle.  No significant lower extremity edema. Neurologic: Sensation grossly intact in extremities.  Symmetrical.  Speech is fluent.  Psychiatric: Judgment intact, Mood & affect appropriate for pt's clinical situation. Dermatologic: No rashes or ulcers noted.  No cellulitis or open wounds.      Labs Recent Results (from the past 2160 hour(s))  CBC with Differential/Platelet     Status: None   Collection Time: 06/04/21  9:10 AM  Result Value Ref Range   WBC 8.3 3.8 - 10.8 Thousand/uL   RBC 4.32 3.80 - 5.10 Million/uL   Hemoglobin 14.1 11.7 - 15.5 g/dL   HCT 42.0 35.0 - 45.0 %   MCV 97.2 80.0 - 100.0 fL   MCH 32.6 27.0 - 33.0 pg   MCHC 33.6 32.0 - 36.0 g/dL   RDW 12.3 11.0 - 15.0 %   Platelets 318 140 - 400 Thousand/uL   MPV 10.6 7.5 - 12.5 fL   Neutro Abs 4,972 1,500 - 7,800 cells/uL   Lymphs Abs 2,673 850 - 3,900 cells/uL   Absolute Monocytes 490 200 - 950 cells/uL   Eosinophils Absolute 149 15 - 500 cells/uL   Basophils Absolute 17 0 - 200 cells/uL   Neutrophils Relative % 59.9 %   Total Lymphocyte 32.2 %   Monocytes Relative 5.9 %   Eosinophils Relative 1.8 %   Basophils Relative 0.2 %  COMPLETE METABOLIC PANEL WITH GFR     Status: Abnormal   Collection Time: 06/04/21  9:10 AM  Result Value Ref Range   Glucose, Bld 94 65 - 99 mg/dL    Comment: .            Fasting reference interval .    BUN 16 7 - 25 mg/dL   Creat 0.65 0.60 - 0.95 mg/dL   eGFR 87 > OR = 60 mL/min/1.22m2    Comment: The eGFR is based on the CKD-EPI 2021 equation. To calculate  the new eGFR from a previous Creatinine  or Cystatin C result, go to https://www.kidney.org/professionals/ kdoqi/gfr%5Fcalculator    BUN/Creatinine Ratio NOT APPLICABLE 6 - 22 (calc)   Sodium 142 135 - 146 mmol/L   Potassium 4.3 3.5 - 5.3 mmol/L   Chloride 104 98 - 110 mmol/L   CO2 30 20 - 32 mmol/L   Calcium 9.6 8.6 - 10.4 mg/dL   Total Protein 6.0 (L) 6.1 - 8.1 g/dL   Albumin 3.9 3.6 - 5.1 g/dL   Globulin 2.1 1.9 - 3.7 g/dL (calc)   AG Ratio 1.9 1.0 - 2.5 (calc)   Total Bilirubin 0.6 0.2 - 1.2 mg/dL   Alkaline phosphatase (APISO) 42 37 - 153 U/L   AST 12 10 - 35 U/L   ALT 17 6 - 29 U/L  Lipid panel     Status: Abnormal   Collection Time: 06/04/21  9:10 AM  Result Value Ref Range   Cholesterol 196 <200 mg/dL   HDL 70 > OR = 50 mg/dL   Triglycerides 97 <150 mg/dL   LDL Cholesterol (Calc) 107 (H) mg/dL (calc)    Comment: Reference range: <100 . Desirable range <100 mg/dL for primary prevention;   <70 mg/dL for patients with CHD or diabetic patients  with > or = 2 CHD risk factors. Marland Kitchen LDL-C is now calculated using the Martin-Hopkins  calculation, which is a validated novel method providing  better accuracy than the Friedewald equation in the  estimation of LDL-C.  Hassell Done  SS et al. JAMA. 7342;876(81): 2061-2068  (http://education.[facility].com/faq/FAQ164)    Total CHOL/HDL Ratio 2.8 <5.0 (calc)   Non-HDL Cholesterol (Calc) 126 <130 mg/dL (calc)    Comment: For patients with diabetes plus 1 major ASCVD risk  factor, treating to a non-HDL-C goal of <100 mg/dL  (LDL-C of <70 mg/dL) is considered a therapeutic  option.   TSH     Status: None   Collection Time: 06/04/21  9:10 AM  Result Value Ref Range   TSH 1.73 0.40 - 4.50 mIU/L    Radiology No results found.  Assessment/Plan Hyperlipemia lipid control important in reducing the progression of atherosclerotic disease. Continue statin therapy    Congestive heart failure (HCC) Poor cardiac function certainly worsens and exacerbates lower extremity  swelling type symptoms.  Lymphedema Symptoms are extremely well controlled with essentially no swelling at this point.  Essential hypertension blood pressure control important in reducing the progression of atherosclerotic disease. On appropriate oral medications.   Chronic venous insufficiency Still has discoloration in her feet.  Her pain is largely around her knees and likely from arthritic pain.  She does not have swelling.  No role for intervention at this time. Recheck in one year    Leotis Pain, MD  06/07/2021 11:43 AM    This note was created with Dragon medical transcription system.  Any errors from dictation are purely unintentional

## 2021-06-07 NOTE — Progress Notes (Signed)
Subjective:   Teresa Chambers is a 85 y.o. female who presents for Medicare Annual (Subsequent) preventive examination.  I discussed the limitations of evaluation and management by telemedicine and the availability of in person appointments. Patient expressed understanding and agreed to proceed.   Visit performed using audio  Patient:home Provider:home   Review of Systems    Defer to provider Cardiac Risk Factors include: advanced age (>48men, >67 women)      Today's Vitals   06/07/21 1448  PainSc: 0-No pain   There is no height or weight on file to calculate BMI.  Advanced Directives 06/07/2021 03/09/2018 03/04/2018 03/01/2018 02/22/2018 02/11/2018 02/11/2018  Does Patient Have a Medical Advance Directive? No Yes No No No Yes Yes  Type of Advance Directive - [place];Living will - - - Catering manager  Does patient want to make changes to medical advance directive? - No - Patient declined No - Patient declined No - Patient declined No - Patient declined No - Patient declined -  Copy of [place] in Chart? - No - copy requested - - - No - copy requested -  Would patient like information on creating a medical advance directive? No - Patient declined - - - - No - Patient declined -    Current Medications (verified) Outpatient Encounter Medications as of 06/07/2021  Medication Sig   Zoster Vaccine Adjuvanted [facility]) injection Inject 0.5 mLs into the muscle once for 1 dose. Please administer shingles vaccine and fax to provider at [phone]   acetaminophen (TYLENOL) 325 MG tablet Take 1-2 tablets (325-650 mg total) by mouth every 6 (six) hours as needed for mild pain (pain score 1-3 or temp > 100.5).   Ascorbic Acid (VITAMIN C WITH ROSE HIPS) 1000 MG tablet Take 1,000 mg by mouth daily.   BREO ELLIPTA 200-25 MCG/INH AEPB Inhale 1 puff into the lungs daily.   Calcium Carbonate-Vitamin D 600-400 MG-UNIT  tablet Take 1 tablet by mouth daily.   clopidogrel (PLAVIX) 75 MG tablet Take 1 tablet (75 mg total) by mouth daily.   DULoxetine (CYMBALTA) 60 MG capsule TAKE 1 CAPSULE BY MOUTH TWICE DAILY   ENTRESTO 24-26 MG TAKE 1 TABLET BY MOUTH TWICE DAILY   ezetimibe (ZETIA) 10 MG tablet TAKE 1 TABLET(10 MG) BY MOUTH DAILY   fluticasone (FLONASE) 50 MCG/ACT nasal spray SHAKE LIQUID AND USE 2 SPRAYS IN EACH NOSTRIL DAILY   levothyroxine (SYNTHROID) 50 MCG tablet TAKE 1 TABLET(50 MCG) BY MOUTH DAILY   montelukast (SINGULAIR) 10 MG tablet Take 1 tablet (10 mg total) by mouth daily at 2 PM.   pantoprazole (PROTONIX) 40 MG tablet Take 1 tablet (40 mg total) by mouth daily.   traMADol (ULTRAM) 50 MG tablet    No facility-administered encounter medications on file as of 06/07/2021.    Allergies (verified) Codeine and Lidocaine   History: Past Medical History:  Diagnosis Date   Actinic keratosis    Acute thoracic back pain    Asthma    Basal cell carcinoma    nose    Chest pain    unspecified   CHF (congestive heart failure) ([place]) 04/30/2017   Chronic abdominal pain 05/01/2017   unspecified   Congenital heart failure (HCC)    COPD (chronic obstructive pulmonary disease) (HCC)    Disease of upper respiratory system 04/30/2017   Dyspnea on exertion    Esophageal reflux disease 04/30/2017   History of bone density  study 06/28/2004   Leg swelling    Lumbar arthropathy    Nasopharyngitis 04/30/2017   Osteoarthropathy 04/30/2017   Osteoporosis 04/30/2017   Palpitations    Sinusitis, acute 04/30/2017   unspecified   Squamous cell carcinoma of skin 07/28/2017   Right medial knee. Hypertrophic SCCis. Tx: [name] 07/28/2017   Past Surgical History:  Procedure Laterality Date   CATARACT EXTRACTION     07/21/2001 - 07/20/2002   CHOLECYSTECTOMY     07/21/1997 - 07/20/1998   COLON SURGERY     COLONOSCOPY     05/05/2000, 01/17/2000 Adenomatous Polyps     COLONOSCOPY     11/05/2009, 12/23/2004,  07/07/2001   PH Adenomatous Polyps: [facility] 10/2014; recall ltr mailed 09/18/2014 (dw)    ESOPHAGOGASTRODUODENOSCOPY     11/05/2009, 05/05/2000   FLEXIBLE SIGMOIDOSCOPY  11/28/1999   HAND SURGERY  2006   HERNIA REPAIR     HIP ARTHROPLASTY Right 02/11/2018   Procedure: ARTHROPLASTY BIPOLAR HIP (HEMIARTHROPLASTY);  Surgeon: Corky Mull, MD;  Location: [facility] ORS;  Service: Orthopedics;  Laterality: Right;   LAPAROSCOPIC COLON RESECTION     2001   Family History  Problem Relation Age of Onset   Heart disease Mother    Heart disease Father    Hypertension Son    Hypertension Daughter    Social History   Socioeconomic History   Marital status: Divorced    Spouse name: Not on file   Number of children: 5   Years of education: 15.5   Highest education level: High school graduate  Occupational History   Occupation: retired  Tobacco Use   Smoking status: Never   Smokeless tobacco: Never  Scientific laboratory technician Use: Never used  Substance and Sexual Activity   Alcohol use: Not Currently   Drug use: Never   Sexual activity: Not Currently  Other Topics Concern   Not on file  Social History Narrative   Lives independently, has home at [facility] she enjoys traveling to   Social Determinants of Radio broadcast assistant Strain: Low Risk    Difficulty of Paying Living Expenses: Not hard at all  Food Insecurity: No Food Insecurity   Worried About Charity fundraiser in the Last Year: Never true   Arboriculturist in the Last Year: Never true  Transportation Needs: Not on file  Physical Activity: Inactive   Days of Exercise per Week: 0 days   Minutes of Exercise per Session: 0 min  Stress: No Stress Concern Present   Feeling of Stress : Only a little  Social Connections: Socially Isolated   Frequency of Communication with Friends and Family: More than three times a week   Frequency of Social Gatherings with Friends and Family: More than three times a week   Attends Religious  Services: Never   Marine scientist or Organizations: No   Attends Music therapist: Never   Marital Status: Divorced    Tobacco Counseling Counseling given: Not Answered   Clinical Intake:  Pre-visit preparation completed: Yes  Pain : No/denies pain Pain Score: 0-No pain     Diabetes: No  How often do you need to have someone help you when you read instructions, pamphlets, or other written materials from your doctor or pharmacy?: 1 - Never What is the last grade level you completed in school?: 12th and 3 years of college  Diabetic?no  Interpreter Needed?: No  Information entered by :: Lacretia Nicks, [place]  Activities of Daily Living In your present state of health, do you have any difficulty performing the following activities: 06/07/2021 06/04/2021  Hearing? N N  Vision? N N  Difficulty concentrating or making decisions? N N  Walking or climbing stairs? N Y  Dressing or bathing? N N  Doing errands, shopping? N N  Preparing Food and eating ? N N  Using the Toilet? N N  In the past six months, have you accidently leaked urine? N Y  Do you have problems with loss of bowel control? N N  Managing your Medications? N N  Managing your Finances? N N  Housekeeping or managing your Housekeeping? N N  Some recent data might be hidden    Patient Care Team: Cletis Athens, MD as PCP - General (Internal Medicine)  Indicate any recent Medical Services you may have received from other than [facility] providers in the past year (date may be approximate).     Assessment:   This is a routine wellness examination for [name].  Hearing/Vision screen No results found.  Dietary issues and exercise activities discussed: Current Exercise Habits: Home exercise routine, Type of exercise: walking, Time (Minutes): 20, Frequency (Times/Week): 3, Weekly Exercise (Minutes/Week): 60, Intensity: Mild, Exercise limited by: orthopedic condition(s)   Goals Addressed   None     Depression Screen PHQ 2/9 Scores 06/07/2021 03/04/2021 02/20/2021 02/27/2020 11/10/2018 10/07/2018 08/11/2018  PHQ - 2 Score 0 0 0 1 0 0 0  Exception Documentation - - - - - - -    Fall Risk Fall Risk  06/07/2021 06/04/2021 02/20/2021 02/27/2020 02/14/2020  Falls in the past year? 0 0 1 1 1   Comment - - - - Emmi Telephone Survey: data to providers prior to load  Number falls in past yr: 0 - 1 1 1   Comment - - - - Emmi Telephone Survey Actual Response = 1  Injury with Fall? 0 - 1 1 1   Risk Factor Category  - - - - -  Risk for fall due to : No Fall Risks - History of fall(s) - -  Follow up Falls evaluation completed - Falls evaluation completed - -    FALL RISK PREVENTION PERTAINING TO THE HOME:  Any stairs in or around the home? Yes  If so, are there any without handrails? No  Home free of loose throw rugs in walkways, pet beds, electrical cords, etc? Yes  Adequate lighting in your home to reduce risk of falls? Yes   ASSISTIVE DEVICES UTILIZED TO PREVENT FALLS:  Life alert? No  Use of a cane, walker or w/c? No  Grab bars in the bathroom? No  Shower chair or bench in shower? No  Elevated toilet seat or a handicapped toilet? No   TIMED UP AND GO:  Was the test performed? No .  Length of time to ambulate- NA   Gait slow and steady without use of assistive device  Cognitive Function: MMSE - Mini Mental State Exam 06/07/2021  Not completed: Unable to complete     6CIT Screen 06/07/2021  What Year? 0 points  What month? 0 points  What time? 0 points  Count back from 20 0 points  Months in reverse 0 points  Repeat phrase 0 points  Total Score 0    Immunizations Immunization History  Administered Date(s) Administered   Fluad Quad(high Dose 65+) 04/03/2021   Influenza-Unspecified 04/08/2018   PFIZER(Purple Top)SARS-COV-2 Vaccination 08/15/2019, 09/05/2019, 05/10/2020   PPD Test 02/13/2018   Pneumococcal Conjugate-13 07/01/2013  Tdap 04/03/2021    TDAP status: Up to  date  Flu Vaccine status: Up to date  Pneumococcal vaccine status: Due, Education has been provided regarding the importance of this vaccine. Advised may receive this vaccine at local pharmacy or Health Dept. Aware to provide a copy of the vaccination record if obtained from local pharmacy or Health Dept. Verbalized acceptance and understanding.  Covid-19 vaccine status: Information provided on how to obtain vaccines.   Qualifies for Shingles Vaccine? yes  Zostavax completed no  Shingrix Completed?: No.    Education has been provided regarding the importance of this vaccine. Patient has been advised to call insurance company to determine out of pocket expense if they have not yet received this vaccine. Advised may also receive vaccine at local pharmacy or Health Dept. Verbalized acceptance and understanding.  Screening Tests Health Maintenance  Topic Date Due   Pneumonia Vaccine 66+ Years old (2 - PPSV23 if available, else PCV20) 07/01/2014   COVID-19 Vaccine (4 - Booster for Pfizer series) 07/05/2020   Zoster Vaccines- Shingrix (1 of 2) 09/07/2021 (Originally 03/28/1955)   TETANUS/TDAP  04/04/2031   INFLUENZA VACCINE  Completed   DEXA SCAN  Completed   HPV VACCINES  Aged Out    Health Maintenance  Health Maintenance Due  Topic Date Due   Pneumonia Vaccine 57+ Years old (2 - PPSV23 if available, else PCV20) 07/01/2014   COVID-19 Vaccine (4 - Booster for [place] series) 07/05/2020    Colorectal cancer screening: No longer required.   Mammogram status: No longer required due to age.    Lung Cancer Screening: (Low Dose CT Chest recommended if Age 2-80 years, 30 pack-year currently smoking OR have quit w/in 15years.) does not qualify.   Lung Cancer Screening Referral: NA  Additional Screening:  Hepatitis C Screening: does not qualify  Vision Screening: Recommended annual ophthalmology exams for early detection of glaucoma and other disorders of the eye. Is the patient up to  date with their annual eye exam?  Yes  Who is the provider or what is the name of the office in which the patient attends annual eye exams? [facility]  If pt is not established with a provider, would they like to be referred to a provider to establish care?  Already established  .   Dental Screening: Recommended annual dental exams for proper oral hygiene  Community Resource Referral / Chronic Care Management: CRR required this visit?  No   CCM required this visit?  No      Plan:     I have personally reviewed and noted the following in the patient's chart:   Medical and social history Use of alcohol, tobacco or illicit drugs  Current medications and supplements including opioid prescriptions.  Functional ability and status Nutritional status Physical activity Advanced directives List of other physicians Hospitalizations, surgeries, and ER visits in previous 12 months Vitals Screenings to include cognitive, depression, and falls Referrals and appointments  In addition, I have reviewed and discussed with patient certain preventive protocols, quality metrics, and best practice recommendations. A written personalized care plan for preventive services as well as general preventive health recommendations were provided to patient.     Lacretia Nicks, [place]   06/07/2021   Nurse Notes:  Current Outpatient Medications on File Prior to Visit  Medication Sig Dispense Refill   acetaminophen (TYLENOL) 325 MG tablet Take 1-2 tablets (325-650 mg total) by mouth every 6 (six) hours as needed for mild pain (pain score 1-3 or  temp > 100.5).     Ascorbic Acid (VITAMIN C WITH ROSE HIPS) 1000 MG tablet Take 1,000 mg by mouth daily.     BREO ELLIPTA 200-25 MCG/INH AEPB Inhale 1 puff into the lungs daily. 3 each 3   Calcium Carbonate-Vitamin D 600-400 MG-UNIT tablet Take 1 tablet by mouth daily.     clopidogrel (PLAVIX) 75 MG tablet Take 1 tablet (75 mg total) by mouth daily. 90 tablet 3    DULoxetine (CYMBALTA) 60 MG capsule TAKE 1 CAPSULE BY MOUTH TWICE DAILY 60 capsule 3   ENTRESTO 24-26 MG TAKE 1 TABLET BY MOUTH TWICE DAILY 180 tablet 1   ezetimibe (ZETIA) 10 MG tablet TAKE 1 TABLET(10 MG) BY MOUTH DAILY 90 tablet 3   fluticasone (FLONASE) 50 MCG/ACT nasal spray SHAKE LIQUID AND USE 2 SPRAYS IN EACH NOSTRIL DAILY 48 g 6   levothyroxine (SYNTHROID) 50 MCG tablet TAKE 1 TABLET(50 MCG) BY MOUTH DAILY 90 tablet 3   montelukast (SINGULAIR) 10 MG tablet Take 1 tablet (10 mg total) by mouth daily at 2 PM. 90 tablet 3   pantoprazole (PROTONIX) 40 MG tablet Take 1 tablet (40 mg total) by mouth daily. 90 tablet 3   traMADol (ULTRAM) 50 MG tablet      No current facility-administered medications on file prior to visit.     Time spent 35 min

## 2021-06-07 NOTE — Assessment & Plan Note (Signed)
Symptoms are extremely well controlled with essentially no swelling at this point.

## 2021-06-08 NOTE — Progress Notes (Signed)
I have reviewed this visit and agree with the documentation.   

## 2021-06-21 DIAGNOSIS — M48062 Spinal stenosis, lumbar region with neurogenic claudication: Secondary | ICD-10-CM | POA: Diagnosis not present

## 2021-06-21 DIAGNOSIS — M5416 Radiculopathy, lumbar region: Secondary | ICD-10-CM | POA: Diagnosis not present

## 2021-06-26 DIAGNOSIS — I7300 Raynaud's syndrome without gangrene: Secondary | ICD-10-CM | POA: Diagnosis not present

## 2021-06-26 DIAGNOSIS — Z872 Personal history of diseases of the skin and subcutaneous tissue: Secondary | ICD-10-CM | POA: Diagnosis not present

## 2021-06-26 DIAGNOSIS — I739 Peripheral vascular disease, unspecified: Secondary | ICD-10-CM | POA: Diagnosis not present

## 2021-06-26 DIAGNOSIS — R238 Other skin changes: Secondary | ICD-10-CM | POA: Diagnosis not present

## 2021-06-26 DIAGNOSIS — G629 Polyneuropathy, unspecified: Secondary | ICD-10-CM | POA: Diagnosis not present

## 2021-06-26 DIAGNOSIS — I509 Heart failure, unspecified: Secondary | ICD-10-CM | POA: Diagnosis not present

## 2021-07-03 ENCOUNTER — Ambulatory Visit: Payer: PPO | Admitting: Internal Medicine

## 2021-07-21 DIAGNOSIS — R509 Fever, unspecified: Secondary | ICD-10-CM | POA: Diagnosis not present

## 2021-07-21 DIAGNOSIS — S322XXG Fracture of coccyx, subsequent encounter for fracture with delayed healing: Secondary | ICD-10-CM | POA: Diagnosis not present

## 2021-07-21 DIAGNOSIS — R110 Nausea: Secondary | ICD-10-CM | POA: Diagnosis not present

## 2021-07-21 DIAGNOSIS — Z20822 Contact with and (suspected) exposure to covid-19: Secondary | ICD-10-CM | POA: Diagnosis not present

## 2021-07-21 DIAGNOSIS — R32 Unspecified urinary incontinence: Secondary | ICD-10-CM | POA: Diagnosis not present

## 2021-07-21 DIAGNOSIS — M4600 Spinal enthesopathy, site unspecified: Secondary | ICD-10-CM | POA: Diagnosis not present

## 2021-07-21 DIAGNOSIS — R519 Headache, unspecified: Secondary | ICD-10-CM | POA: Diagnosis not present

## 2021-07-26 DIAGNOSIS — Z09 Encounter for follow-up examination after completed treatment for conditions other than malignant neoplasm: Secondary | ICD-10-CM | POA: Diagnosis not present

## 2021-07-26 DIAGNOSIS — Z8709 Personal history of other diseases of the respiratory system: Secondary | ICD-10-CM | POA: Diagnosis not present

## 2021-08-01 ENCOUNTER — Ambulatory Visit (INDEPENDENT_AMBULATORY_CARE_PROVIDER_SITE_OTHER): Payer: PPO | Admitting: Dermatology

## 2021-08-01 ENCOUNTER — Other Ambulatory Visit: Payer: Self-pay

## 2021-08-01 DIAGNOSIS — L821 Other seborrheic keratosis: Secondary | ICD-10-CM | POA: Diagnosis not present

## 2021-08-01 DIAGNOSIS — L814 Other melanin hyperpigmentation: Secondary | ICD-10-CM

## 2021-08-01 DIAGNOSIS — Z872 Personal history of diseases of the skin and subcutaneous tissue: Secondary | ICD-10-CM

## 2021-08-01 DIAGNOSIS — L578 Other skin changes due to chronic exposure to nonionizing radiation: Secondary | ICD-10-CM

## 2021-08-01 DIAGNOSIS — L84 Corns and callosities: Secondary | ICD-10-CM | POA: Diagnosis not present

## 2021-08-01 DIAGNOSIS — I872 Venous insufficiency (chronic) (peripheral): Secondary | ICD-10-CM | POA: Diagnosis not present

## 2021-08-01 DIAGNOSIS — D229 Melanocytic nevi, unspecified: Secondary | ICD-10-CM | POA: Diagnosis not present

## 2021-08-01 DIAGNOSIS — Z1283 Encounter for screening for malignant neoplasm of skin: Secondary | ICD-10-CM | POA: Diagnosis not present

## 2021-08-01 DIAGNOSIS — D692 Other nonthrombocytopenic purpura: Secondary | ICD-10-CM

## 2021-08-01 DIAGNOSIS — L720 Epidermal cyst: Secondary | ICD-10-CM

## 2021-08-01 DIAGNOSIS — Z85828 Personal history of other malignant neoplasm of skin: Secondary | ICD-10-CM | POA: Diagnosis not present

## 2021-08-01 DIAGNOSIS — D1800 Hemangioma unspecified site: Secondary | ICD-10-CM | POA: Diagnosis not present

## 2021-08-01 NOTE — Progress Notes (Signed)
Follow-Up Visit   Subjective  Teresa Chambers is a 86 y.o. female who presents for the following: Total body skin exam (Hx of BCC nose, Hx AKs) and check spot (L 4th toe, not sure how long it has been there, tender). The patient presents for Total-Body Skin Exam (TBSE) for skin cancer screening and mole check.  The patient has spots, moles and lesions to be evaluated, some may be new or changing and the patient has concerns that these could be cancer.  The following portions of the chart were reviewed this encounter and updated as appropriate:   Tobacco   Allergies   Meds   Problems   Med Hx   Surg Hx   Fam Hx      Review of Systems:  No other skin or systemic complaints except as noted in HPI or Assessment and Plan.  Objective  Well appearing patient in no apparent distress; mood and affect are within normal limits.  A full examination was performed including scalp, head, eyes, ears, nose, lips, neck, chest, axillae, abdomen, back, buttocks, bilateral upper extremities, bilateral lower extremities, hands, feet, fingers, toes, fingernails, and toenails. All findings within normal limits unless otherwise noted below.  bil lower legs Stasis changes lower legs  Medial aspect of 4th toe callus   Assessment & Plan   Lentigines - Scattered tan macules - Due to sun exposure - Benign-appearing, observe - Recommend daily broad spectrum sunscreen SPF 30+ to sun-exposed areas, reapply every 2 hours as needed. - Call for any changes  Seborrheic Keratoses - Stuck-on, waxy, tan-brown papules and/or plaques  - Benign-appearing - Discussed benign etiology and prognosis. - Observe - Call for any changes  Melanocytic Nevi - Tan-brown and/or pink-flesh-colored symmetric macules and papules - Benign appearing on exam today - Observation - Call clinic for new or changing moles - Recommend daily use of broad spectrum spf 30+ sunscreen to sun-exposed areas.   Hemangiomas - Red papules -  Discussed benign nature - Observe - Call for any changes  Actinic Damage - Chronic condition, secondary to cumulative UV/sun exposure - diffuse scaly erythematous macules with underlying dyspigmentation - Recommend daily broad spectrum sunscreen SPF 30+ to sun-exposed areas, reapply every 2 hours as needed.  - Staying in the shade or wearing long sleeves, sun glasses (UVA+UVB protection) and wide brim hats (4-inch brim around the entire circumference of the hat) are also recommended for sun protection.  - Call for new or changing lesions.  Skin cancer screening performed today.  History of Basal Cell Carcinoma of the Skin - No evidence of recurrence today - Recommend regular full body skin exams - Recommend daily broad spectrum sunscreen SPF 30+ to sun-exposed areas, reapply every 2 hours as needed.  - Call if any new or changing lesions are noted between office visits  - nose  Stasis dermatitis of both legs bil lower legs  Benign, observe  Callus Medial aspect of 4th toe  Benign  Recommend pt seeing a podiatrist for treatment, she will schedule.  Milia - tiny firm white papules - type of cyst - benign - may be extracted if symptomatic - observe   Purpura - Chronic; persistent and recurrent.  Treatable, but not curable. - Violaceous macules and patches - Benign - Related to trauma, age, sun damage and/or use of blood thinners, chronic use of topical and/or oral steroids - Observe - Can use OTC arnica containing moisturizer such as Dermend Bruise Formula if desired - Call for worsening  or other concerns   History of PreCancerous Actinic Keratosis  - site(s) of PreCancerous Actinic Keratosis clear today. - these may recur and new lesions may form requiring treatment to prevent transformation into skin cancer - observe for new or changing spots and contact [facility] for appointment if occur - photoprotection with sun protective clothing; sunglasses and broad  spectrum sunscreen with SPF of at least 30 + and frequent self skin exams recommended - yearly exams by a dermatologist recommended for persons with history of PreCancerous Actinic Keratoses   Return in about 1 year (around 08/01/2022) for TBSE, Hx of BCC, Hx of AKs.  I, Othelia Pulling, RMA, am acting as scribe for Sarina Ser, MD . Documentation: I have reviewed the above documentation for accuracy and completeness, and I agree with the above.  Sarina Ser, MD

## 2021-08-01 NOTE — Patient Instructions (Signed)

## 2021-08-02 ENCOUNTER — Encounter: Payer: Self-pay | Admitting: Dermatology

## 2021-08-12 ENCOUNTER — Other Ambulatory Visit: Payer: Self-pay

## 2021-08-12 ENCOUNTER — Encounter: Payer: Self-pay | Admitting: Internal Medicine

## 2021-08-12 ENCOUNTER — Ambulatory Visit (INDEPENDENT_AMBULATORY_CARE_PROVIDER_SITE_OTHER): Payer: PPO | Admitting: Internal Medicine

## 2021-08-12 VITALS — BP 112/65 | HR 102 | Ht 66.0 in | Wt 159.6 lb

## 2021-08-12 DIAGNOSIS — I5082 Biventricular heart failure: Secondary | ICD-10-CM

## 2021-08-12 DIAGNOSIS — I10 Essential (primary) hypertension: Secondary | ICD-10-CM | POA: Diagnosis not present

## 2021-08-12 DIAGNOSIS — E063 Autoimmune thyroiditis: Secondary | ICD-10-CM

## 2021-08-12 DIAGNOSIS — J449 Chronic obstructive pulmonary disease, unspecified: Secondary | ICD-10-CM | POA: Diagnosis not present

## 2021-08-12 DIAGNOSIS — E038 Other specified hypothyroidism: Secondary | ICD-10-CM | POA: Diagnosis not present

## 2021-08-12 DIAGNOSIS — K5909 Other constipation: Secondary | ICD-10-CM | POA: Diagnosis not present

## 2021-08-12 DIAGNOSIS — M8000XS Age-related osteoporosis with current pathological fracture, unspecified site, sequela: Secondary | ICD-10-CM | POA: Diagnosis not present

## 2021-08-12 NOTE — Assessment & Plan Note (Signed)
Patient is stable.

## 2021-08-12 NOTE — Assessment & Plan Note (Signed)
Patient does not smoke

## 2021-08-12 NOTE — Progress Notes (Signed)
Established Patient Office Visit  Subjective:  Patient ID: Teresa Chambers, female    DOB: 1935-10-22  Age: 86 y.o. MRN: [id number]  CC:  Chief Complaint  Patient presents with   Hypertension    Hypertension   Teresa Chambers presents for general check up  Past Medical History:  Diagnosis Date   Actinic keratosis    Acute thoracic back pain    Asthma    Basal cell carcinoma    nose    Chest pain    unspecified   CHF (congestive heart failure) ([place]) 04/30/2017   Chronic abdominal pain 05/01/2017   unspecified   Congenital heart failure (HCC)    COPD (chronic obstructive pulmonary disease) ([place])    Disease of upper respiratory system 04/30/2017   Dyspnea on exertion    Esophageal reflux disease 04/30/2017   History of bone density study 06/28/2004   Leg swelling    Lumbar arthropathy    Nasopharyngitis 04/30/2017   Osteoarthropathy 04/30/2017   Osteoporosis 04/30/2017   Palpitations    Sinusitis, acute 04/30/2017   unspecified   Squamous cell carcinoma of skin 07/28/2017   Right medial knee. Hypertrophic SCCis. Tx: [name] 07/28/2017    Past Surgical History:  Procedure Laterality Date   CATARACT EXTRACTION     07/21/2001 - 07/20/2002   CHOLECYSTECTOMY     07/21/1997 - 07/20/1998   COLON SURGERY     COLONOSCOPY     05/05/2000, 01/17/2000 Adenomatous Polyps     COLONOSCOPY     11/05/2009, 12/23/2004, 07/07/2001   [place] Adenomatous Polyps: CBF 10/2014; recall ltr mailed 09/18/2014 (dw)    ESOPHAGOGASTRODUODENOSCOPY     11/05/2009, 05/05/2000   FLEXIBLE SIGMOIDOSCOPY  11/28/1999   HAND SURGERY  2006   HERNIA REPAIR     HIP ARTHROPLASTY Right 02/11/2018   Procedure: ARTHROPLASTY BIPOLAR HIP (HEMIARTHROPLASTY);  Surgeon: Corky Mull, MD;  Location: [facility] ORS;  Service: Orthopedics;  Laterality: Right;   LAPAROSCOPIC COLON RESECTION     2001    Family History  Problem Relation Age of Onset   Heart disease Mother    Heart disease Father    Hypertension Son     Hypertension Daughter     Social History   Socioeconomic History   Marital status: Divorced    Spouse name: Not on file   Number of children: 5   Years of education: 15.5   Highest education level: High school graduate  Occupational History   Occupation: retired  Tobacco Use   Smoking status: Never   Smokeless tobacco: Never  Scientific laboratory technician Use: Never used  Substance and Sexual Activity   Alcohol use: Not Currently   Drug use: Never   Sexual activity: Not Currently  Other Topics Concern   Not on file  Social History Narrative   Lives independently, has home at [facility] she enjoys traveling to   Social Determinants of Radio broadcast assistant Strain: Low Risk    Difficulty of Paying Living Expenses: Not hard at all  Food Insecurity: No Food Insecurity   Worried About Charity fundraiser in the Last Year: Never true   Arboriculturist in the Last Year: Never true  Transportation Needs: Not on file  Physical Activity: Inactive   Days of Exercise per Week: 0 days   Minutes of Exercise per Session: 0 min  Stress: No Stress Concern Present   Feeling of Stress : Only a little  Social Connections:  Socially Isolated   Frequency of Communication with Friends and Family: More than three times a week   Frequency of Social Gatherings with Friends and Family: More than three times a week   Attends Religious Services: Never   Marine scientist or Organizations: No   Attends Music therapist: Never   Marital Status: Divorced  Human resources officer Violence: Not At Risk   Fear of Current or Ex-Partner: No   Emotionally Abused: No   Physically Abused: No   Sexually Abused: No     Current Outpatient Medications:    Ascorbic Acid (VITAMIN C WITH ROSE HIPS) 1000 MG tablet, Take 1,000 mg by mouth daily., Disp: , Rfl:    BREO ELLIPTA 200-25 MCG/INH AEPB, Inhale 1 puff into the lungs daily., Disp: 3 each, Rfl: 3   Calcium Carbonate-Vitamin D 600-400 MG-UNIT  tablet, Take 1 tablet by mouth daily., Disp: , Rfl:    clopidogrel (PLAVIX) 75 MG tablet, Take 1 tablet (75 mg total) by mouth daily., Disp: 90 tablet, Rfl: 3   DULoxetine (CYMBALTA) 60 MG capsule, Take 1 capsule (60 mg total) by mouth 2 (two) times daily., Disp: 180 capsule, Rfl: 3   ENTRESTO 24-26 MG, TAKE 1 TABLET BY MOUTH TWICE DAILY, Disp: 180 tablet, Rfl: 1   ezetimibe (ZETIA) 10 MG tablet, TAKE 1 TABLET(10 MG) BY MOUTH DAILY, Disp: 90 tablet, Rfl: 3   fluticasone (FLONASE) 50 MCG/ACT nasal spray, SHAKE LIQUID AND USE 2 SPRAYS IN EACH NOSTRIL DAILY, Disp: 48 g, Rfl: 6   levothyroxine (SYNTHROID) 50 MCG tablet, TAKE 1 TABLET(50 MCG) BY MOUTH DAILY, Disp: 90 tablet, Rfl: 3   montelukast (SINGULAIR) 10 MG tablet, Take 1 tablet (10 mg total) by mouth daily at 2 PM., Disp: 90 tablet, Rfl: 3   pantoprazole (PROTONIX) 40 MG tablet, Take 1 tablet (40 mg total) by mouth daily., Disp: 90 tablet, Rfl: 3   traMADol (ULTRAM) 50 MG tablet, , Disp: , Rfl:    acetaminophen (TYLENOL) 325 MG tablet, Take 1-2 tablets (325-650 mg total) by mouth every 6 (six) hours as needed for mild pain (pain score 1-3 or temp > 100.5)., Disp: , Rfl:    Allergies  Allergen Reactions   Codeine Nausea And Vomiting   Lidocaine Swelling and Anaphylaxis    ROS Review of Systems  Constitutional: Negative.   HENT: Negative.    Eyes: Negative.   Respiratory: Negative.  Negative for choking.   Cardiovascular: Negative.   Gastrointestinal: Negative.   Endocrine: Negative.   Genitourinary: Negative.  Negative for decreased urine volume.  Musculoskeletal: Negative.   Skin: Negative.   Allergic/Immunologic: Negative.   Neurological: Negative.  Negative for syncope and light-headedness.  Hematological: Negative.   Psychiatric/Behavioral: Negative.  Negative for confusion.   All other systems reviewed and are negative.    Objective:    Physical Exam Vitals reviewed.  Constitutional:      Appearance: Normal appearance.   HENT:     Mouth/Throat:     Mouth: Mucous membranes are moist.  Eyes:     Pupils: Pupils are equal, round, and reactive to light.  Neck:     Vascular: No carotid bruit.  Cardiovascular:     Rate and Rhythm: Normal rate and regular rhythm.     Pulses: Normal pulses.     Heart sounds: Normal heart sounds.  Pulmonary:     Effort: Pulmonary effort is normal.     Breath sounds: Normal breath sounds.  Abdominal:  General: Bowel sounds are normal.     Palpations: Abdomen is soft. There is no hepatomegaly, splenomegaly or mass.     Tenderness: There is no abdominal tenderness.     Hernia: No hernia is present.  Musculoskeletal:        General: No tenderness.     Cervical back: Neck supple.     Right lower leg: No edema.     Left lower leg: No edema.  Skin:    Findings: No rash.  Neurological:     Mental Status: She is alert and oriented to person, place, and time.     Motor: No weakness.  Psychiatric:        Mood and Affect: Mood and affect normal.        Behavior: Behavior normal.    BP 112/65    Pulse (!) 102    Ht $R'5\' 6"'iu$  (1.676 m)    Wt 159 lb 9.6 oz (72.4 kg)    BMI 25.76 kg/m  Wt Readings from Last 3 Encounters:  08/12/21 159 lb 9.6 oz (72.4 kg)  06/07/21 153 lb (69.4 kg)  06/03/21 155 lb 6.4 oz (70.5 kg)     Health Maintenance Due  Topic Date Due   Pneumonia Vaccine 45+ Years old (2 - PPSV23 if available, else PCV20) 07/01/2014   COVID-19 Vaccine (4 - Booster for Pfizer series) 07/05/2020    There are no preventive care reminders to display for this patient.  Lab Results  Component Value Date   TSH 1.73 06/04/2021   Lab Results  Component Value Date   WBC 8.3 06/04/2021   HGB 14.1 06/04/2021   HCT 42.0 06/04/2021   MCV 97.2 06/04/2021   PLT 318 06/04/2021   Lab Results  Component Value Date   NA 142 06/04/2021   K 4.3 06/04/2021   CO2 30 06/04/2021   GLUCOSE 94 06/04/2021   BUN 16 06/04/2021   CREATININE 0.65 06/04/2021   BILITOT 0.6 06/04/2021    ALKPHOS 48 03/29/2019   AST 12 06/04/2021   ALT 17 06/04/2021   PROT 6.0 (L) 06/04/2021   ALBUMIN 4.0 03/29/2019   CALCIUM 9.6 06/04/2021   ANIONGAP 9 03/29/2019   EGFR 87 06/04/2021   Lab Results  Component Value Date   CHOL 196 06/04/2021   Lab Results  Component Value Date   HDL 70 06/04/2021   Lab Results  Component Value Date   LDLCALC 107 (H) 06/04/2021   Lab Results  Component Value Date   TRIG 97 06/04/2021   Lab Results  Component Value Date   CHOLHDL 2.8 06/04/2021   No results found for: HGBA1C    Assessment & Plan:   Problem List Items Addressed This Visit       Cardiovascular and Mediastinum   CHF (congestive heart failure) ([place])    Blood pressure is stable at the present time      Essential hypertension - Primary     Patient denies any chest pain or shortness of breath there is no history of palpitation or paroxysmal nocturnal dyspnea   patient was advised to follow low-salt low-cholesterol diet    ideally I want to keep systolic blood pressure below 130 mmHg, patient was asked to check blood pressure one times a week and give me a report on that.  Patient will be follow-up in 3 months  or earlier as needed, patient will call me back for any change in the cardiovascular symptoms Patient was advised to buy a  book from local bookstore concerning blood pressure and read several chapters  every day.  This will be supplemented by some of the material we will give him from the office.  Patient should also utilize other resources like YouTube and Internet to learn more about the blood pressure and the diet.        Respiratory   Chronic obstructive pulmonary disease ([place])    Patient does not smoke        Digestive   Chronic constipation    Patient was advised to take MiraLAX every day        Endocrine   Hypothyroidism due to Hashimoto's thyroiditis    Stable at the present time        Musculoskeletal and Integument   Osteoporosis     Patient is stable       No orders of the defined types were placed in this encounter.   Follow-up: No follow-ups on file.    Cletis Athens, MD

## 2021-08-12 NOTE — Assessment & Plan Note (Signed)
Patient was advised to take MiraLAX every day

## 2021-08-12 NOTE — Assessment & Plan Note (Signed)
Stable at the present time. 

## 2021-08-12 NOTE — Assessment & Plan Note (Signed)
Blood pressure is stable at the present time 

## 2021-08-12 NOTE — Assessment & Plan Note (Signed)

## 2021-08-22 ENCOUNTER — Other Ambulatory Visit: Payer: Self-pay | Admitting: Internal Medicine

## 2021-08-22 DIAGNOSIS — J309 Allergic rhinitis, unspecified: Secondary | ICD-10-CM

## 2021-08-28 DIAGNOSIS — M170 Bilateral primary osteoarthritis of knee: Secondary | ICD-10-CM | POA: Diagnosis not present

## 2021-10-06 ENCOUNTER — Other Ambulatory Visit: Payer: Self-pay | Admitting: Internal Medicine

## 2021-10-22 DIAGNOSIS — M5416 Radiculopathy, lumbar region: Secondary | ICD-10-CM | POA: Diagnosis not present

## 2021-10-22 DIAGNOSIS — M48062 Spinal stenosis, lumbar region with neurogenic claudication: Secondary | ICD-10-CM | POA: Diagnosis not present

## 2021-11-11 ENCOUNTER — Ambulatory Visit: Payer: PPO | Admitting: Internal Medicine

## 2021-11-25 DIAGNOSIS — M48062 Spinal stenosis, lumbar region with neurogenic claudication: Secondary | ICD-10-CM | POA: Diagnosis not present

## 2021-11-25 DIAGNOSIS — M170 Bilateral primary osteoarthritis of knee: Secondary | ICD-10-CM | POA: Diagnosis not present

## 2021-11-25 DIAGNOSIS — M5416 Radiculopathy, lumbar region: Secondary | ICD-10-CM | POA: Diagnosis not present

## 2021-11-25 DIAGNOSIS — M5136 Other intervertebral disc degeneration, lumbar region: Secondary | ICD-10-CM | POA: Diagnosis not present

## 2021-12-02 ENCOUNTER — Encounter: Payer: Self-pay | Admitting: Internal Medicine

## 2021-12-02 ENCOUNTER — Ambulatory Visit (INDEPENDENT_AMBULATORY_CARE_PROVIDER_SITE_OTHER): Payer: PPO | Admitting: Internal Medicine

## 2021-12-02 ENCOUNTER — Other Ambulatory Visit: Payer: Self-pay | Admitting: *Deleted

## 2021-12-02 VITALS — BP 128/78 | HR 84 | Ht 66.0 in | Wt 159.0 lb

## 2021-12-02 DIAGNOSIS — R0609 Other forms of dyspnea: Secondary | ICD-10-CM | POA: Diagnosis not present

## 2021-12-02 DIAGNOSIS — K2100 Gastro-esophageal reflux disease with esophagitis, without bleeding: Secondary | ICD-10-CM | POA: Diagnosis not present

## 2021-12-02 DIAGNOSIS — I10 Essential (primary) hypertension: Secondary | ICD-10-CM

## 2021-12-02 DIAGNOSIS — M47816 Spondylosis without myelopathy or radiculopathy, lumbar region: Secondary | ICD-10-CM

## 2021-12-02 DIAGNOSIS — J449 Chronic obstructive pulmonary disease, unspecified: Secondary | ICD-10-CM

## 2021-12-02 DIAGNOSIS — I5082 Biventricular heart failure: Secondary | ICD-10-CM

## 2021-12-02 MED ORDER — PREDNISONE 10 MG PO TABS: 5.0000 mg | ORAL_TABLET | Freq: Every day | ORAL | 0 refills | Status: DC

## 2021-12-02 MED ORDER — PREDNISONE 20 MG PO TABS: 20.0000 mg | ORAL_TABLET | Freq: Every day | ORAL | 0 refills | Status: DC

## 2021-12-02 NOTE — Assessment & Plan Note (Signed)
-   The patient's GERD is stable on medication.  - Instructed the patient to avoid eating spicy and acidic foods, as well as foods high in fat. - Instructed the patient to avoid eating large meals or meals 2-3 hours prior to sleeping. 

## 2021-12-02 NOTE — Assessment & Plan Note (Signed)

## 2021-12-02 NOTE — Assessment & Plan Note (Signed)
Patient insisted on getting prednisone taper, she was given 5 mg p.o. daily for 10 days, patient already received full dose of prednisone FROM OUT SIDE ?

## 2021-12-02 NOTE — Assessment & Plan Note (Signed)
Patient does not smoke anymore ?

## 2021-12-02 NOTE — Assessment & Plan Note (Signed)
Stable at the present time. 

## 2021-12-02 NOTE — Progress Notes (Signed)
? ?Established Patient Office Visit ? ?Subjective:  ?Patient ID: Teresa Chambers, female    DOB: 04/01/1936  Age: 86 y.o. MRN: [id number] ? ?CC:  ?Chief Complaint  ?Patient presents with  ? Hypertension  ?  Patient is here for her 3 month blood pressure follow up.  ? ? ?HPI ? ?Teresa Chambers presents for checkup and COPD.  She denies chest pain complains of shortness of breath on exertion she does not smoke does not drink.  Denies any history of swelling of the legs.  She does not use a stick to walk.  No history of using VAP ? ?Past Medical History:  ?Diagnosis Date  ? Actinic keratosis   ? Acute thoracic back pain   ? Asthma   ? Basal cell carcinoma   ? nose   ? Chest pain   ? unspecified  ? CHF (congestive heart failure) ([place]) 04/30/2017  ? Chronic abdominal pain 05/01/2017  ? unspecified  ? Congenital heart failure ([place])   ? COPD (chronic obstructive pulmonary disease) ([place])   ? Disease of upper respiratory system 04/30/2017  ? Dyspnea on exertion   ? Esophageal reflux disease 04/30/2017  ? History of bone density study 06/28/2004  ? Leg swelling   ? Lumbar arthropathy   ? Nasopharyngitis 04/30/2017  ? Osteoarthropathy 04/30/2017  ? Osteoporosis 04/30/2017  ? Palpitations   ? Sinusitis, acute 04/30/2017  ? unspecified  ? Squamous cell carcinoma of skin 07/28/2017  ? Right medial knee. Hypertrophic SCCis. Tx: [facility] 07/28/2017  ? ? ?Past Surgical History:  ?Procedure Laterality Date  ? CATARACT EXTRACTION    ? 07/21/2001 - 07/20/2002  ? CHOLECYSTECTOMY    ? 07/21/1997 - 07/20/1998  ? COLON SURGERY    ? COLONOSCOPY    ? 05/05/2000, 01/17/2000 Adenomatous Polyps    ? COLONOSCOPY    ? 11/05/2009, 12/23/2004, 07/07/2001   [place] Adenomatous Polyps: CBF 10/2014; recall ltr mailed 09/18/2014 (dw)   ? ESOPHAGOGASTRODUODENOSCOPY    ? 11/05/2009, 05/05/2000  ? FLEXIBLE SIGMOIDOSCOPY  11/28/1999  ? HAND SURGERY  2006  ? HERNIA REPAIR    ? HIP ARTHROPLASTY Right 02/11/2018  ? Procedure: ARTHROPLASTY BIPOLAR HIP (HEMIARTHROPLASTY);  Surgeon:  Corky Mull, MD;  Location: [facility] ORS;  Service: Orthopedics;  Laterality: Right;  ? LAPAROSCOPIC COLON RESECTION    ? 2001  ? ? ?Family History  ?Problem Relation Age of Onset  ? Heart disease Mother   ? Heart disease Father   ? Hypertension Son   ? Hypertension Daughter   ? ? ?Social History  ? ?Socioeconomic History  ? Marital status: Divorced  ?  Spouse name: Not on file  ? Number of children: 5  ? Years of education: 15.5  ? Highest education level: High school graduate  ?Occupational History  ? Occupation: retired  ?Tobacco Use  ? Smoking status: Never  ? Smokeless tobacco: Never  ?Vaping Use  ? Vaping Use: Never used  ?Substance and Sexual Activity  ? Alcohol use: Not Currently  ? Drug use: Never  ? Sexual activity: Not Currently  ?Other Topics Concern  ? Not on file  ?Social History Narrative  ? Lives independently, [facility] she enjoys traveling to  ? ?Social Determinants of Health  ? ?Financial Resource Strain: Low Risk   ? Difficulty of Paying Living Expenses: Not hard at all  ?Food Insecurity: No Food Insecurity  ? Worried About Charity fundraiser in the Last Year: Never true  ?  Ran Out of Food in the Last Year: Never true  ?Transportation Needs: Not on file  ?Physical Activity: Inactive  ? Days of Exercise per Week: 0 days  ? Minutes of Exercise per Session: 0 min  ?Stress: No Stress Concern Present  ? Feeling of Stress : Only a little  ?Social Connections: Socially Isolated  ? Frequency of Communication with Friends and Family: More than three times a week  ? Frequency of Social Gatherings with Friends and Family: More than three times a week  ? Attends Religious Services: Never  ? Active Member of Clubs or Organizations: No  ? Attends Archivist Meetings: Never  ? Marital Status: Divorced  ?Intimate Partner Violence: Not At Risk  ? Fear of Current or Ex-Partner: No  ? Emotionally Abused: No  ? Physically Abused: No  ? Sexually Abused: No  ? ? ? ?Current Outpatient  Medications:  ?  Ascorbic Acid (VITAMIN C WITH ROSE HIPS) 1000 MG tablet, Take 1,000 mg by mouth daily., Disp: , Rfl:  ?  BREO ELLIPTA 200-25 MCG/INH AEPB, Inhale 1 puff into the lungs daily., Disp: 3 each, Rfl: 3 ?  Calcium Carbonate-Vitamin D 600-400 MG-UNIT tablet, Take 1 tablet by mouth daily., Disp: , Rfl:  ?  clopidogrel (PLAVIX) 75 MG tablet, Take 1 tablet (75 mg total) by mouth daily., Disp: 90 tablet, Rfl: 3 ?  DULoxetine (CYMBALTA) 60 MG capsule, Take 1 capsule (60 mg total) by mouth 2 (two) times daily., Disp: 180 capsule, Rfl: 3 ?  ENTRESTO 24-26 MG, TAKE 1 TABLET BY MOUTH TWICE DAILY, Disp: 180 tablet, Rfl: 1 ?  ezetimibe (ZETIA) 10 MG tablet, TAKE 1 TABLET(10 MG) BY MOUTH DAILY, Disp: 90 tablet, Rfl: 3 ?  fluticasone (FLONASE) 50 MCG/ACT nasal spray, SHAKE LIQUID AND USE 2 SPRAYS IN EACH NOSTRIL DAILY, Disp: 48 g, Rfl: 6 ?  levothyroxine (SYNTHROID) 50 MCG tablet, TAKE 1 TABLET(50 MCG) BY MOUTH DAILY, Disp: 90 tablet, Rfl: 3 ?  montelukast (SINGULAIR) 10 MG tablet, Take 1 tablet (10 mg total) by mouth daily at 2 PM., Disp: 90 tablet, Rfl: 3 ?  pantoprazole (PROTONIX) 40 MG tablet, Take 1 tablet (40 mg total) by mouth daily., Disp: 90 tablet, Rfl: 3 ?  traMADol (ULTRAM) 50 MG tablet, , Disp: , Rfl:   ? ?Allergies  ?Allergen Reactions  ? Codeine Nausea And Vomiting  ? Lidocaine Swelling and Anaphylaxis  ? ? ?ROS ?Review of Systems  ?Constitutional: Negative.   ?HENT: Negative.    ?Eyes: Negative.   ?Respiratory:  Positive for cough and shortness of breath.   ?Cardiovascular: Negative.  Negative for chest pain and leg swelling.  ?Gastrointestinal: Negative.   ?Endocrine: Negative.   ?Genitourinary: Negative.   ?Musculoskeletal: Negative.   ?Skin: Negative.   ?Allergic/Immunologic: Negative.   ?Neurological: Negative.   ?Hematological: Negative.   ?Psychiatric/Behavioral: Negative.    ?All other systems reviewed and are negative. ? ?  ?Objective:  ?  ?Physical Exam ?Vitals reviewed.  ?Constitutional:   ?    Appearance: Normal appearance.  ?HENT:  ?   Mouth/Throat:  ?   Mouth: Mucous membranes are moist.  ?Eyes:  ?   Pupils: Pupils are equal, round, and reactive to light.  ?Neck:  ?   Vascular: No carotid bruit.  ?Cardiovascular:  ?   Rate and Rhythm: Normal rate and regular rhythm.  ?   Pulses: Normal pulses.  ?   Heart sounds: Normal heart sounds.  ?Pulmonary:  ?   Effort:  Pulmonary effort is normal.  ?   Breath sounds: Normal breath sounds.  ?Abdominal:  ?   General: Bowel sounds are normal.  ?   Palpations: Abdomen is soft. There is no hepatomegaly, splenomegaly or mass.  ?   Tenderness: There is no abdominal tenderness.  ?   Hernia: No hernia is present.  ?Musculoskeletal:     ?   General: No tenderness.  ?   Cervical back: Neck supple.  ?   Right lower leg: No edema.  ?   Left lower leg: No edema.  ?Skin: ?   Findings: No rash.  ?Neurological:  ?   Mental Status: She is alert and oriented to person, place, and time.  ?   Motor: No weakness.  ?Psychiatric:     ?   Mood and Affect: Mood and affect normal.     ?   Behavior: Behavior normal.  ? ? ?BP 128/78   Pulse 84   Ht '5\' 6"'$  (1.676 m)   Wt 159 lb (72.1 kg)   BMI 25.66 kg/m?  ?Wt Readings from Last 3 Encounters:  ?12/02/21 159 lb (72.1 kg)  ?08/12/21 159 lb 9.6 oz (72.4 kg)  ?06/07/21 153 lb (69.4 kg)  ? ? ? ?Health Maintenance Due  ?Topic Date Due  ? Zoster Vaccines- Shingrix (1 of 2) Never done  ? Pneumonia Vaccine 1+ Years old (2 - PPSV23 if available, else PCV20) 07/01/2014  ? COVID-19 Vaccine (4 - Booster for Pfizer series) 07/05/2020  ? ? ?There are no preventive care reminders to display for this patient. ? ?Lab Results  ?Component Value Date  ? TSH 1.73 06/04/2021  ? ?Lab Results  ?Component Value Date  ? WBC 8.3 06/04/2021  ? HGB 14.1 06/04/2021  ? HCT 42.0 06/04/2021  ? MCV 97.2 06/04/2021  ? PLT 318 06/04/2021  ? ?Lab Results  ?Component Value Date  ? NA 142 06/04/2021  ? K 4.3 06/04/2021  ? CO2 30 06/04/2021  ? GLUCOSE 94 06/04/2021  ? BUN 16  06/04/2021  ? CREATININE 0.65 06/04/2021  ? BILITOT 0.6 06/04/2021  ? ALKPHOS 48 03/29/2019  ? AST 12 06/04/2021  ? ALT 17 06/04/2021  ? PROT 6.0 (L) 06/04/2021  ? ALBUMIN 4.0 03/29/2019  ? CALCIUM 9.6 06/04/2021  ? ANIO

## 2021-12-09 ENCOUNTER — Other Ambulatory Visit: Payer: Self-pay | Admitting: *Deleted

## 2021-12-09 MED ORDER — FLUTICASONE FUROATE-VILANTEROL 200-25 MCG/ACT IN AEPB: 1.0000 | INHALATION_SPRAY | Freq: Every day | RESPIRATORY_TRACT | 3 refills | Status: DC

## 2022-01-01 DIAGNOSIS — L03115 Cellulitis of right lower limb: Secondary | ICD-10-CM | POA: Diagnosis not present

## 2022-01-01 DIAGNOSIS — T63421A Toxic effect of venom of ants, accidental (unintentional), initial encounter: Secondary | ICD-10-CM | POA: Diagnosis not present

## 2022-01-13 DIAGNOSIS — M5136 Other intervertebral disc degeneration, lumbar region: Secondary | ICD-10-CM | POA: Diagnosis not present

## 2022-01-13 DIAGNOSIS — M47816 Spondylosis without myelopathy or radiculopathy, lumbar region: Secondary | ICD-10-CM | POA: Diagnosis not present

## 2022-01-13 DIAGNOSIS — Z79899 Other long term (current) drug therapy: Secondary | ICD-10-CM | POA: Diagnosis not present

## 2022-01-13 DIAGNOSIS — M48062 Spinal stenosis, lumbar region with neurogenic claudication: Secondary | ICD-10-CM | POA: Diagnosis not present

## 2022-01-13 DIAGNOSIS — M5416 Radiculopathy, lumbar region: Secondary | ICD-10-CM | POA: Diagnosis not present

## 2022-01-14 ENCOUNTER — Other Ambulatory Visit: Payer: Self-pay | Admitting: Internal Medicine

## 2022-01-14 DIAGNOSIS — Z79899 Other long term (current) drug therapy: Secondary | ICD-10-CM | POA: Diagnosis not present

## 2022-01-31 DIAGNOSIS — M542 Cervicalgia: Secondary | ICD-10-CM | POA: Diagnosis not present

## 2022-01-31 DIAGNOSIS — M19011 Primary osteoarthritis, right shoulder: Secondary | ICD-10-CM | POA: Diagnosis not present

## 2022-01-31 DIAGNOSIS — M5412 Radiculopathy, cervical region: Secondary | ICD-10-CM | POA: Diagnosis not present

## 2022-01-31 DIAGNOSIS — M25511 Pain in right shoulder: Secondary | ICD-10-CM | POA: Diagnosis not present

## 2022-01-31 DIAGNOSIS — M79601 Pain in right arm: Secondary | ICD-10-CM | POA: Diagnosis not present

## 2022-02-21 DIAGNOSIS — M48062 Spinal stenosis, lumbar region with neurogenic claudication: Secondary | ICD-10-CM | POA: Diagnosis not present

## 2022-02-21 DIAGNOSIS — M5416 Radiculopathy, lumbar region: Secondary | ICD-10-CM | POA: Diagnosis not present

## 2022-02-26 DIAGNOSIS — M170 Bilateral primary osteoarthritis of knee: Secondary | ICD-10-CM | POA: Diagnosis not present

## 2022-03-03 ENCOUNTER — Encounter: Payer: Self-pay | Admitting: Internal Medicine

## 2022-03-03 ENCOUNTER — Ambulatory Visit (INDEPENDENT_AMBULATORY_CARE_PROVIDER_SITE_OTHER): Payer: PPO | Admitting: Internal Medicine

## 2022-03-03 VITALS — BP 124/70 | HR 90 | Ht 66.0 in | Wt 155.3 lb

## 2022-03-03 DIAGNOSIS — J449 Chronic obstructive pulmonary disease, unspecified: Secondary | ICD-10-CM | POA: Diagnosis not present

## 2022-03-03 DIAGNOSIS — K219 Gastro-esophageal reflux disease without esophagitis: Secondary | ICD-10-CM | POA: Diagnosis not present

## 2022-03-03 DIAGNOSIS — I5082 Biventricular heart failure: Secondary | ICD-10-CM

## 2022-03-03 DIAGNOSIS — I7300 Raynaud's syndrome without gangrene: Secondary | ICD-10-CM | POA: Diagnosis not present

## 2022-03-03 DIAGNOSIS — M19011 Primary osteoarthritis, right shoulder: Secondary | ICD-10-CM | POA: Diagnosis not present

## 2022-03-03 DIAGNOSIS — S42201D Unspecified fracture of upper end of right humerus, subsequent encounter for fracture with routine healing: Secondary | ICD-10-CM | POA: Diagnosis not present

## 2022-03-03 DIAGNOSIS — M47812 Spondylosis without myelopathy or radiculopathy, cervical region: Secondary | ICD-10-CM | POA: Diagnosis not present

## 2022-03-03 NOTE — Assessment & Plan Note (Signed)
Both feet are cold, will refer the patient to Dr. Lucky Cowboy

## 2022-03-03 NOTE — Assessment & Plan Note (Signed)
Stable at the present time. 

## 2022-03-03 NOTE — Assessment & Plan Note (Signed)
No wheezing was noted,.  The [place] was stopped

## 2022-03-03 NOTE — Progress Notes (Signed)
Established Patient Office Visit  Subjective:  Patient ID: Teresa Chambers, female    DOB: [birth year]  Age: 86 y.o. MRN: [id number]  CC:  Chief Complaint  Patient presents with   Follow-up    HPI  Teresa Chambers presents for check up  Past Medical History:  Diagnosis Date   Actinic keratosis    Acute thoracic back pain    Asthma    Basal cell carcinoma    nose    Chest pain    unspecified   CHF (congestive heart failure) ([place]) 04/30/2017   Chronic abdominal pain 05/01/2017   unspecified   Congenital heart failure (HCC)    COPD (chronic obstructive pulmonary disease) ([place])    Disease of upper respiratory system 04/30/2017   Dyspnea on exertion    Esophageal reflux disease 04/30/2017   History of bone density study 06/28/2004   Leg swelling    Lumbar arthropathy    Nasopharyngitis 04/30/2017   Osteoarthropathy 04/30/2017   Osteoporosis 04/30/2017   Palpitations    Sinusitis, acute 04/30/2017   unspecified   Squamous cell carcinoma of skin 07/28/2017   Right medial knee. Hypertrophic SCCis. Tx: [facility] 07/28/2017    Past Surgical History:  Procedure Laterality Date   CATARACT EXTRACTION     07/21/2001 - 07/20/2002   CHOLECYSTECTOMY     07/21/1997 - 07/20/1998   COLON SURGERY     COLONOSCOPY     05/05/2000, 01/17/2000 Adenomatous Polyps     COLONOSCOPY     11/05/2009, 12/23/2004, 07/07/2001   PH Adenomatous Polyps: CBF 10/2014; recall ltr mailed 09/18/2014 (dw)    ESOPHAGOGASTRODUODENOSCOPY     11/05/2009, 05/05/2000   FLEXIBLE SIGMOIDOSCOPY  11/28/1999   HAND SURGERY  2006   HERNIA REPAIR     HIP ARTHROPLASTY Right 02/11/2018   Procedure: ARTHROPLASTY BIPOLAR HIP (HEMIARTHROPLASTY);  Surgeon: Corky Mull, MD;  Location: [facility] ORS;  Service: Orthopedics;  Laterality: Right;   LAPAROSCOPIC COLON RESECTION     2001    Family History  Problem Relation Age of Onset   Heart disease Mother    Heart disease Father    Hypertension Son    Hypertension Daughter      Social History   Socioeconomic History   Marital status: Divorced    Spouse name: Not on file   Number of children: 5   Years of education: 15.5   Highest education level: High school graduate  Occupational History   Occupation: retired  Tobacco Use   Smoking status: Never   Smokeless tobacco: Never  Scientific laboratory technician Use: Never used  Substance and Sexual Activity   Alcohol use: Not Currently   Drug use: Never   Sexual activity: Not Currently  Other Topics Concern   Not on file  Social History Narrative   Lives independently, has home at [facility] she enjoys traveling to   Social Determinants of Health   Financial Resource Strain: [place]  (06/07/2021)   Overall Financial Resource Strain (CARDIA)    Difficulty of Paying Living Expenses: Not hard at all  Food Insecurity: No Food Insecurity (06/07/2021)   Hunger Vital Sign    Worried About Running Out of Food in the Last Year: Never true    [place] in the Last Year: Never true  Transportation Needs: No Transportation Needs (03/09/2018)   PRAPARE - Hydrologist (Medical): No    Lack of Transportation (Non-Medical): No  Physical Activity:  Inactive (06/07/2021)   Exercise Vital Sign    Days of Exercise per Week: 0 days    Minutes of Exercise per Session: 0 min  Stress: No Stress Concern Present (06/07/2021)   [place]    Feeling of Stress : Only a little  Social Connections: Socially Isolated (06/07/2021)   Social Connection and Isolation Panel [NHANES]    Frequency of Communication with Friends and Family: More than three times a week    Frequency of Social Gatherings with Friends and Family: More than three times a week    Attends Religious Services: Never    Marine scientist or Organizations: No    Attends Archivist Meetings: Never    Marital Status: Divorced  Human resources officer Violence:  Not At Risk (06/07/2021)   Humiliation, Afraid, Rape, and Kick questionnaire    Fear of Current or Ex-Partner: No    Emotionally Abused: No    Physically Abused: No    Sexually Abused: No     Current Outpatient Medications:    Ascorbic Acid (VITAMIN C WITH ROSE HIPS) 1000 MG tablet, Take 1,000 mg by mouth daily., Disp: , Rfl:    Calcium Carbonate-Vitamin D 600-400 MG-UNIT tablet, Take 1 tablet by mouth daily., Disp: , Rfl:    clopidogrel (PLAVIX) 75 MG tablet, Take 1 tablet (75 mg total) by mouth daily., Disp: 90 tablet, Rfl: 3   ENTRESTO 24-26 MG, TAKE 1 TABLET BY MOUTH TWICE DAILY, Disp: 180 tablet, Rfl: 1   ezetimibe (ZETIA) 10 MG tablet, TAKE 1 TABLET(10 MG) BY MOUTH DAILY, Disp: 90 tablet, Rfl: 3   fluticasone (FLONASE) 50 MCG/ACT nasal spray, SHAKE LIQUID AND USE 2 SPRAYS IN EACH NOSTRIL DAILY, Disp: 48 g, Rfl: 6   fluticasone furoate-vilanterol (BREO ELLIPTA) 200-25 MCG/ACT AEPB, Inhale 1 puff into the lungs daily., Disp: 84 each, Rfl: 3   levothyroxine (SYNTHROID) 50 MCG tablet, TAKE 1 TABLET(50 MCG) BY MOUTH DAILY, Disp: 90 tablet, Rfl: 3   pantoprazole (PROTONIX) 40 MG tablet, TAKE 1 TABLET(40 MG) BY MOUTH DAILY, Disp: 90 tablet, Rfl: 3   traMADol (ULTRAM) 50 MG tablet, , Disp: , Rfl:    Allergies  Allergen Reactions   Codeine Nausea And Vomiting   Lidocaine Swelling and Anaphylaxis    ROS Review of Systems  Constitutional: Negative.   HENT: Negative.    Eyes: Negative.   Respiratory: Negative.    Cardiovascular: Negative.   Gastrointestinal: Negative.   Endocrine: Negative.   Genitourinary: Negative.   Musculoskeletal: Negative.   Skin: Negative.   Allergic/Immunologic: Negative.   Neurological: Negative.   Hematological: Negative.   Psychiatric/Behavioral: Negative.    All other systems reviewed and are negative.     Objective:    Physical Exam Vitals reviewed.  Constitutional:      Appearance: Normal appearance.  HENT:     Mouth/Throat:     Mouth:  Mucous membranes are moist.  Eyes:     Pupils: Pupils are equal, round, and reactive to light.  Neck:     Vascular: No carotid bruit.  Cardiovascular:     Rate and Rhythm: Normal rate and regular rhythm.     Pulses: Normal pulses.     Heart sounds: Normal heart sounds.  Pulmonary:     Effort: Pulmonary effort is normal.     Breath sounds: Normal breath sounds.  Abdominal:     General: Bowel sounds are normal.     Palpations: Abdomen  is soft. There is no hepatomegaly, splenomegaly or mass.     Tenderness: There is no abdominal tenderness.     Hernia: No hernia is present.  Musculoskeletal:        General: No tenderness.     Cervical back: Neck supple.     Right lower leg: No edema.     Left lower leg: No edema.     Comments: Both feet are cold circulation is poor. referred to Dr. Lucky Cowboy  Skin:    Findings: No rash.  Neurological:     Mental Status: She is alert and oriented to person, place, and time.     Motor: No weakness.  Psychiatric:        Mood and Affect: Mood and affect normal.        Behavior: Behavior normal.     BP 124/70   Pulse 90   Ht $R'5\' 6"'OD$  (1.676 m)   Wt 155 lb 4.8 oz (70.4 kg)   BMI 25.07 kg/m  Wt Readings from Last 3 Encounters:  03/03/22 155 lb 4.8 oz (70.4 kg)  12/02/21 159 lb (72.1 kg)  08/12/21 159 lb 9.6 oz (72.4 kg)     Health Maintenance Due  Topic Date Due   Zoster Vaccines- Shingrix (1 of 2) Never done   Pneumonia Vaccine 59+ Years old (2 - PPSV23 or PCV20) 07/01/2014   COVID-19 Vaccine (4 - Pfizer risk series) 07/05/2020   INFLUENZA VACCINE  02/18/2022    There are no preventive care reminders to display for this patient.  Lab Results  Component Value Date   TSH 1.73 06/04/2021   Lab Results  Component Value Date   WBC 8.3 06/04/2021   HGB 14.1 06/04/2021   HCT 42.0 06/04/2021   MCV 97.2 06/04/2021   PLT 318 06/04/2021   Lab Results  Component Value Date   NA 142 06/04/2021   K 4.3 06/04/2021   CO2 30 06/04/2021    GLUCOSE 94 06/04/2021   BUN 16 06/04/2021   CREATININE 0.65 06/04/2021   BILITOT 0.6 06/04/2021   ALKPHOS 48 03/29/2019   AST 12 06/04/2021   ALT 17 06/04/2021   PROT 6.0 (L) 06/04/2021   ALBUMIN 4.0 03/29/2019   CALCIUM 9.6 06/04/2021   ANIONGAP 9 03/29/2019   EGFR 87 06/04/2021   Lab Results  Component Value Date   CHOL 196 06/04/2021   Lab Results  Component Value Date   HDL 70 06/04/2021   Lab Results  Component Value Date   LDLCALC 107 (H) 06/04/2021   Lab Results  Component Value Date   TRIG 97 06/04/2021   Lab Results  Component Value Date   CHOLHDL 2.8 06/04/2021   No results found for: "HGBA1C"    Assessment & Plan:   Problem List Items Addressed This Visit       Cardiovascular and Mediastinum   CHF (congestive heart failure) ([place]) - Primary    Stable on Entresto      Raynaud's disease without gangrene    Both feet are cold, will refer the patient to Dr. Lucky Cowboy        Respiratory   Chronic obstructive pulmonary disease ([place])    No wheezing was noted,.  The [place] was stopped        Digestive   GERD without esophagitis    Stable at the present time       No orders of the defined types were placed in this encounter.   Follow-up: No follow-ups on  file.    Cletis Athens, MD

## 2022-03-03 NOTE — Assessment & Plan Note (Signed)
Stable on Entresto

## 2022-03-04 MED ORDER — PREGABALIN 50 MG PO CAPS: 50.0000 mg | ORAL_CAPSULE | Freq: Two times a day (BID) | ORAL | 0 refills | Status: DC

## 2022-03-04 NOTE — Addendum Note (Signed)
Addended by: Cletis Athens on: 03/04/2022 04:05 PM   Modules accepted: Orders

## 2022-03-11 ENCOUNTER — Encounter (INDEPENDENT_AMBULATORY_CARE_PROVIDER_SITE_OTHER): Payer: Self-pay | Admitting: Vascular Surgery

## 2022-03-11 ENCOUNTER — Ambulatory Visit (INDEPENDENT_AMBULATORY_CARE_PROVIDER_SITE_OTHER): Payer: PPO | Admitting: Vascular Surgery

## 2022-03-11 VITALS — BP 120/72 | HR 88 | Resp 16 | Ht 66.0 in | Wt 155.0 lb

## 2022-03-11 DIAGNOSIS — I5082 Biventricular heart failure: Secondary | ICD-10-CM

## 2022-03-11 DIAGNOSIS — I890 Lymphedema, not elsewhere classified: Secondary | ICD-10-CM

## 2022-03-11 DIAGNOSIS — I10 Essential (primary) hypertension: Secondary | ICD-10-CM | POA: Diagnosis not present

## 2022-03-11 DIAGNOSIS — I872 Venous insufficiency (chronic) (peripheral): Secondary | ICD-10-CM | POA: Diagnosis not present

## 2022-03-11 NOTE — Progress Notes (Signed)
MRN : [id number]  Teresa Chambers is a 86 y.o. (10/19/1935) female who presents with chief complaint of  Chief Complaint  Patient presents with   Follow-up    Leg and feet swelling  .  History of Present Illness: Patient returns today in follow up of her lower extremity swelling and discoloration.  She has noticed worsening swelling over the past several months.  This is associated with some increase of the purplish discoloration of her foot and ankle on each side.  No open wounds or infection.  She has easy bruising in both her arms and her legs.  She was recently given a prescription for Lyrica by her primary care provider but has really just started that this week.  She also has feet that are cold to the touch.  She has tingling and burning in her legs and her hands.  She has some known history of venous insufficiency but her symptoms were better and we have not checked a duplex on her in well over a year.  Current Outpatient Medications  Medication Sig Dispense Refill   Ascorbic Acid (VITAMIN C WITH ROSE HIPS) 1000 MG tablet Take 1,000 mg by mouth daily.     Calcium Carbonate-Vitamin D 600-400 MG-UNIT tablet Take 1 tablet by mouth daily.     clopidogrel (PLAVIX) 75 MG tablet Take 1 tablet (75 mg total) by mouth daily. 90 tablet 3   ENTRESTO 24-26 MG TAKE 1 TABLET BY MOUTH TWICE DAILY 180 tablet 1   ezetimibe (ZETIA) 10 MG tablet TAKE 1 TABLET(10 MG) BY MOUTH DAILY 90 tablet 3   fluticasone (FLONASE) 50 MCG/ACT nasal spray SHAKE LIQUID AND USE 2 SPRAYS IN EACH NOSTRIL DAILY 48 g 6   fluticasone furoate-vilanterol (BREO ELLIPTA) 200-25 MCG/ACT AEPB Inhale 1 puff into the lungs daily. 84 each 3   levothyroxine (SYNTHROID) 50 MCG tablet TAKE 1 TABLET(50 MCG) BY MOUTH DAILY 90 tablet 3   metaxalone (SKELAXIN) 800 MG tablet May take 1/2-1 whole tablet up to 3 times daily as needed for pain or spasm.     pantoprazole (PROTONIX) 40 MG tablet TAKE 1 TABLET(40 MG) BY MOUTH DAILY 90 tablet 3    pregabalin (LYRICA) 50 MG capsule Take 1 capsule (50 mg total) by mouth 2 (two) times daily. 60 capsule 0   tiZANidine (ZANAFLEX) 4 MG tablet Take by mouth.     traMADol (ULTRAM) 50 MG tablet      acetaminophen (TYLENOL 8 HOUR ARTHRITIS PAIN) 650 MG CR tablet      No current facility-administered medications for this visit.    Past Medical History:  Diagnosis Date   Actinic keratosis    Acute thoracic back pain    Asthma    Basal cell carcinoma    nose    Chest pain    unspecified   CHF (congestive heart failure) ([place]) 04/30/2017   Chronic abdominal pain 05/01/2017   unspecified   Congenital heart failure (HCC)    COPD (chronic obstructive pulmonary disease) (HCC)    Disease of upper respiratory system 04/30/2017   Dyspnea on exertion    Esophageal reflux disease 04/30/2017   History of bone density study 06/28/2004   Leg swelling    Lumbar arthropathy    Nasopharyngitis 04/30/2017   Osteoarthropathy 04/30/2017   Osteoporosis 04/30/2017   Palpitations    Sinusitis, acute 04/30/2017   unspecified   Squamous cell carcinoma of skin 07/28/2017   Right medial knee. Hypertrophic SCCis. Tx: [facility] 07/28/2017  Past Surgical History:  Procedure Laterality Date   CATARACT EXTRACTION     07/21/2001 - 07/20/2002   CHOLECYSTECTOMY     07/21/1997 - 07/20/1998   COLON SURGERY     COLONOSCOPY     05/05/2000, 01/17/2000 Adenomatous Polyps     COLONOSCOPY     11/05/2009, 12/23/2004, 07/07/2001   PH Adenomatous Polyps: CBF 10/2014; recall ltr mailed 09/18/2014 (dw)    ESOPHAGOGASTRODUODENOSCOPY     11/05/2009, 05/05/2000   FLEXIBLE SIGMOIDOSCOPY  11/28/1999   HAND SURGERY  2006   HERNIA REPAIR     HIP ARTHROPLASTY Right 02/11/2018   Procedure: ARTHROPLASTY BIPOLAR HIP (HEMIARTHROPLASTY);  Surgeon: Corky Mull, MD;  Location: [facility] ORS;  Service: Orthopedics;  Laterality: Right;   LAPAROSCOPIC COLON RESECTION     2001     Social History   Tobacco Use   Smoking status: Never    Smokeless tobacco: Never  Vaping Use   Vaping Use: Never used  Substance Use Topics   Alcohol use: Not Currently   Drug use: Never       Family History  Problem Relation Age of Onset   Heart disease Mother    Heart disease Father    Hypertension Son    Hypertension Daughter      Allergies  Allergen Reactions   Codeine Nausea And Vomiting   Lidocaine Swelling and Anaphylaxis     REVIEW OF SYSTEMS (Negative unless checked) Constitutional: '[]'$ Weight loss  '[]'$ Fever  '[]'$ Chills Cardiac: '[]'$ Chest pain   '[]'$ Chest pressure   '[x]'$ Palpitations   '[]'$ Shortness of breath when laying flat   '[]'$ Shortness of breath at rest   '[]'$ Shortness of breath with exertion. Vascular:  '[x]'$ Pain in legs with walking   '[x]'$ Pain in legs at rest   '[]'$ Pain in legs when laying flat   '[]'$ Claudication   '[]'$ Pain in feet when walking  '[]'$ Pain in feet at rest  '[]'$ Pain in feet when laying flat   '[]'$ History of DVT   '[]'$ Phlebitis   '[x]'$ Swelling in legs   '[]'$ Varicose veins   '[]'$ Non-healing ulcers Pulmonary:   '[]'$ Uses home oxygen   '[]'$ Productive cough   '[]'$ Hemoptysis   '[]'$ Wheeze  '[x]'$ COPD   '[x]'$ Asthma Neurologic:  '[]'$ Dizziness  '[]'$ Blackouts   '[]'$ Seizures   '[]'$ History of stroke   '[]'$ History of TIA  '[]'$ Aphasia   '[]'$ Temporary blindness   '[]'$ Dysphagia   '[]'$ Weakness or numbness in arms   '[]'$ Weakness or numbness in legs Musculoskeletal:  '[]'$ Arthritis   '[]'$ Joint swelling   '[]'$ Joint pain   '[]'$ Low back pain Hematologic:  '[x]'$ Easy bruising  '[]'$ Easy bleeding   '[]'$ Hypercoagulable state   '[]'$ Anemic  '[]'$ Hepatitis Gastrointestinal:  '[]'$ Blood in stool   '[]'$ Vomiting blood  '[]'$ Gastroesophageal reflux/heartburn   '[]'$ Abdominal pain Genitourinary:  '[]'$ Chronic kidney disease   '[]'$ Difficult urination  '[]'$ Frequent urination  '[]'$ Burning with urination   '[]'$ Hematuria Skin:  '[]'$ Rashes   '[]'$ Ulcers   '[]'$ Wounds Psychological:  '[]'$ History of anxiety   '[]'$  History of major depression.  Physical Examination  BP 120/72 (BP Location: Left Arm)   Pulse 88   Resp 16   Ht '5\' 6"'$  (1.676 m)   Wt 155 lb (70.3 kg)    BMI 25.02 kg/m  Gen:  WD/WN, NAD Head: [place]/AT, No temporalis wasting. Ear/Nose/Throat: Hearing grossly intact, nares w/o erythema or drainage Eyes: Conjunctiva clear. Sclera non-icteric Neck: Supple.  Trachea midline Pulmonary:  Good air movement, no use of accessory muscles.  Cardiac: RRR, no JVD Vascular:  Vessel Right Left  Radial Palpable Palpable  PT 1+ Palpable Not Palpable  DP 1+ Palpable 1+ Palpable   Musculoskeletal: M/S 5/5 throughout.  No deformity or atrophy. 1+ BLE edema.  Purplish discoloration of the feet and ankles with moderate stasis dermatitis changes present. Neurologic: Sensation grossly intact in extremities.  Symmetrical.  Speech is fluent.  Psychiatric: Judgment intact, Mood & affect appropriate for pt's clinical situation. Dermatologic: No rashes or ulcers noted.  No cellulitis or open wounds.      Labs No results found for this or any previous visit (from the past 2160 hour(s)).  Radiology No results found.  Assessment/Plan Hyperlipemia lipid control important in reducing the progression of atherosclerotic disease. Continue statin therapy     Congestive heart failure (HCC) Poor cardiac function certainly worsens and exacerbates lower extremity swelling type symptoms.  Essential hypertension blood pressure control important in reducing the progression of atherosclerotic disease. On appropriate oral medications.  Lymphedema Swelling is a little worse today.  Recheck a venous reflux study and ABIs in the near future.  Compression and elevation as tolerated.  Chronic venous insufficiency Swelling is worse.  Given her symptoms worsening, it is worth repeating noninvasive studies in the near future to assess her arterial and venous flow and then consider if intervention would be of benefit.    Leotis Pain, MD  03/11/2022 2:43 PM    This note was created with Dragon medical transcription system.  Any errors from  dictation are purely unintentional

## 2022-03-11 NOTE — Assessment & Plan Note (Signed)
Swelling is worse.  Given her symptoms worsening, it is worth repeating noninvasive studies in the near future to assess her arterial and venous flow and then consider if intervention would be of benefit.

## 2022-03-11 NOTE — Assessment & Plan Note (Signed)
Swelling is a little worse today.  Recheck a venous reflux study and ABIs in the near future.  Compression and elevation as tolerated.

## 2022-03-21 DIAGNOSIS — I872 Venous insufficiency (chronic) (peripheral): Secondary | ICD-10-CM | POA: Diagnosis not present

## 2022-03-21 DIAGNOSIS — S90829A Blister (nonthermal), unspecified foot, initial encounter: Secondary | ICD-10-CM | POA: Diagnosis not present

## 2022-03-21 DIAGNOSIS — L0390 Cellulitis, unspecified: Secondary | ICD-10-CM | POA: Diagnosis not present

## 2022-03-29 ENCOUNTER — Other Ambulatory Visit: Payer: Self-pay | Admitting: Internal Medicine

## 2022-03-31 ENCOUNTER — Ambulatory Visit (INDEPENDENT_AMBULATORY_CARE_PROVIDER_SITE_OTHER): Payer: PPO | Admitting: Internal Medicine

## 2022-03-31 ENCOUNTER — Encounter: Payer: Self-pay | Admitting: Internal Medicine

## 2022-03-31 VITALS — BP 114/65 | HR 105 | Ht 66.0 in | Wt 165.4 lb

## 2022-03-31 DIAGNOSIS — I5022 Chronic systolic (congestive) heart failure: Secondary | ICD-10-CM

## 2022-03-31 DIAGNOSIS — I10 Essential (primary) hypertension: Secondary | ICD-10-CM

## 2022-03-31 DIAGNOSIS — I872 Venous insufficiency (chronic) (peripheral): Secondary | ICD-10-CM

## 2022-03-31 DIAGNOSIS — E038 Other specified hypothyroidism: Secondary | ICD-10-CM

## 2022-03-31 DIAGNOSIS — E063 Autoimmune thyroiditis: Secondary | ICD-10-CM

## 2022-03-31 DIAGNOSIS — J449 Chronic obstructive pulmonary disease, unspecified: Secondary | ICD-10-CM

## 2022-03-31 NOTE — Assessment & Plan Note (Signed)

## 2022-03-31 NOTE — Assessment & Plan Note (Signed)
We will check TSH

## 2022-03-31 NOTE — Assessment & Plan Note (Signed)
Stable at the present time, we will stop the [facility] because it may be causing the rash

## 2022-03-31 NOTE — Progress Notes (Signed)
Established Patient Office Visit  Subjective:  Patient ID: Teresa Chambers, female    DOB: 11-28-1935  Age: 86 y.o. MRN: [id number]  CC:  Chief Complaint  Patient presents with   Edema    Patient has swelling of both legs x 1week   Medication Refill    Patient needs refill or lyrica     Medication Refill Associated symptoms include a rash.    [name] presents for blistering of the both legs.  Patient has COPD osteoarthritis of both knee, she cannot put wt on the both legs  Past Medical History:  Diagnosis Date   Actinic keratosis    Acute thoracic back pain    Asthma    Basal cell carcinoma    nose    Chest pain    unspecified   CHF (congestive heart failure) (HCC) 04/30/2017   Chronic abdominal pain 05/01/2017   unspecified   Congenital heart failure (HCC)    COPD (chronic obstructive pulmonary disease) (HCC)    Disease of upper respiratory system 04/30/2017   Dyspnea on exertion    Esophageal reflux disease 04/30/2017   History of bone density study 06/28/2004   Leg swelling    Lumbar arthropathy    Nasopharyngitis 04/30/2017   Osteoarthropathy 04/30/2017   Osteoporosis 04/30/2017   Palpitations    Sinusitis, acute 04/30/2017   unspecified   Squamous cell carcinoma of skin 07/28/2017   Right medial knee. Hypertrophic SCCis. Tx: [facility] 07/28/2017    Past Surgical History:  Procedure Laterality Date   CATARACT EXTRACTION     07/21/2001 - 07/20/2002   CHOLECYSTECTOMY     07/21/1997 - 07/20/1998   COLON SURGERY     COLONOSCOPY     05/05/2000, 01/17/2000 Adenomatous Polyps     COLONOSCOPY     11/05/2009, 12/23/2004, 07/07/2001   PH Adenomatous Polyps: [facility]BF 10/2014; recall ltr mailed 09/18/2014 (dw)    ESOPHAGOGASTRODUODENOSCOPY     11/05/2009, 05/05/2000   FLEXIBLE SIGMOIDOSCOPY  11/28/1999   HAND SURGERY  2006   HERNIA REPAIR     HIP ARTHROPLASTY Right 02/11/2018   Procedure: ARTHROPLASTY BIPOLAR HIP (HEMIARTHROPLASTY);  Surgeon: Christena Flake, MD;  Location:  [facility] ORS;  Service: Orthopedics;  Laterality: Right;   LAPAROSCOPIC COLON RESECTION     2001    Family History  Problem Relation Age of Onset   Heart disease Mother    Heart disease Father    Hypertension Son    Hypertension Daughter     Social History   Socioeconomic History   Marital status: Divorced    Spouse name: Not on file   Number of children: 5   Years of education: 15.5   Highest education level: High school graduate  Occupational History   Occupation: retired  Tobacco Use   Smoking status: Never   Smokeless tobacco: Never  Building services engineer Use: Never used  Substance and Sexual Activity   Alcohol use: Not Currently   Drug use: Never   Sexual activity: Not Currently  Other Topics Concern   Not on file  Social History Narrative   Lives independently, has home at [facility] she enjoys traveling to   Social Determinants of Health   Financial Resource Strain: Low Risk  (06/07/2021)   Overall Financial Resource Strain (CARDIA)    Difficulty of Paying Living Expenses: Not hard at all  Food Insecurity: No Food Insecurity (06/07/2021)   Hunger Vital Sign    Worried About Running Out of  Food in the Last Year: Never true    Ran Out of Food in the Last Year: Never true  Transportation Needs: No Transportation Needs (03/09/2018)   PRAPARE - Administrator, Civil Service (Medical): No    Lack of Transportation (Non-Medical): No  Physical Activity: Inactive (06/07/2021)   Exercise Vital Sign    Days of Exercise per Week: 0 days    Minutes of Exercise per Session: 0 min  Stress: No Stress Concern Present (06/07/2021)   [employer] of Occupational Health - Occupational Stress Questionnaire    Feeling of Stress : Only a little  Social Connections: Socially Isolated (06/07/2021)   Social Connection and Isolation Panel [NHANES]    Frequency of Communication with Friends and Family: More than three times a week    Frequency of Social Gatherings  with Friends and Family: More than three times a week    Attends Religious Services: Never    Database administrator or Organizations: No    Attends Banker Meetings: Never    Marital Status: Divorced  Catering manager Violence: Not At Risk (06/07/2021)   Humiliation, Afraid, Rape, and Kick questionnaire    Fear of Current or Ex-Partner: No    Emotionally Abused: No    Physically Abused: No    Sexually Abused: No     Current Outpatient Medications:    acetaminophen (TYLENOL 8 HOUR ARTHRITIS PAIN) 650 MG CR tablet, , Disp: , Rfl:    Ascorbic Acid (VITAMIN C WITH ROSE HIPS) 1000 MG tablet, Take 1,000 mg by mouth daily., Disp: , Rfl:    Calcium Carbonate-Vitamin D 600-400 MG-UNIT tablet, Take 1 tablet by mouth daily., Disp: , Rfl:    clopidogrel (PLAVIX) 75 MG tablet, Take 1 tablet (75 mg total) by mouth daily., Disp: 90 tablet, Rfl: 3   ENTRESTO 24-26 MG, TAKE 1 TABLET BY MOUTH TWICE DAILY, Disp: 180 tablet, Rfl: 1   ezetimibe (ZETIA) 10 MG tablet, TAKE 1 TABLET(10 MG) BY MOUTH DAILY, Disp: 90 tablet, Rfl: 3   fluticasone (FLONASE) 50 MCG/ACT nasal spray, SHAKE LIQUID AND USE 2 SPRAYS IN EACH NOSTRIL DAILY, Disp: 48 g, Rfl: 6   fluticasone furoate-vilanterol (BREO ELLIPTA) 200-25 MCG/ACT AEPB, Inhale 1 puff into the lungs daily., Disp: 84 each, Rfl: 3   levothyroxine (SYNTHROID) 50 MCG tablet, TAKE 1 TABLET(50 MCG) BY MOUTH DAILY, Disp: 90 tablet, Rfl: 3   metaxalone (SKELAXIN) 800 MG tablet, May take 1/2-1 whole tablet up to 3 times daily as needed for pain or spasm., Disp: , Rfl:    pantoprazole (PROTONIX) 40 MG tablet, TAKE 1 TABLET(40 MG) BY MOUTH DAILY, Disp: 90 tablet, Rfl: 3   pregabalin (LYRICA) 50 MG capsule, TAKE ONE CAPSULE BY MOUTH TWICE DAILY, Disp: 60 capsule, Rfl: 1   tiZANidine (ZANAFLEX) 4 MG tablet, Take by mouth., Disp: , Rfl:    traMADol (ULTRAM) 50 MG tablet, , Disp: , Rfl:    Allergies  Allergen Reactions   Codeine Nausea And Vomiting   Lidocaine  Swelling and Anaphylaxis    ROS Review of Systems  Constitutional: Negative.   HENT: Negative.    Eyes: Negative.   Respiratory: Negative.    Cardiovascular: Negative.   Gastrointestinal: Negative.   Endocrine: Negative.   Genitourinary: Negative.   Musculoskeletal: Negative.   Skin:  Positive for color change, pallor and rash.  Allergic/Immunologic: Negative.   Neurological: Negative.   Hematological: Negative.   Psychiatric/Behavioral: Negative.  Negative for agitation.  All other systems reviewed and are negative.     Objective:    Physical Exam Vitals reviewed.  Constitutional:      Appearance: Normal appearance.  HENT:     Mouth/Throat:     Mouth: Mucous membranes are moist.  Eyes:     Pupils: Pupils are equal, round, and reactive to light.  Neck:     Vascular: No carotid bruit.  Cardiovascular:     Rate and Rhythm: Normal rate and regular rhythm.     Pulses: Normal pulses.     Heart sounds: Normal heart sounds.  Pulmonary:     Effort: Pulmonary effort is normal.     Breath sounds: Normal breath sounds.  Abdominal:     General: Bowel sounds are normal.     Palpations: Abdomen is soft. There is no hepatomegaly, splenomegaly or mass.     Tenderness: There is no abdominal tenderness.     Hernia: No hernia is present.  Musculoskeletal:        General: No tenderness.     Cervical back: Neck supple.     Right lower leg: No edema.     Left lower leg: No edema.  Skin:    General: Skin is moist.     Coloration: Skin is pale and sallow.     Findings: Lesion and rash present. Rash is scaling and vesicular. Rash is not purpuric or pustular.       Neurological:     Mental Status: She is alert and oriented to person, place, and time.     Motor: No weakness.  Psychiatric:        Mood and Affect: Mood and affect normal.        Behavior: Behavior normal.     BP 114/65   Pulse (!) 105   Ht $R'5\' 6"'dA$  (1.676 m)   Wt 165 lb 6.4 oz (75 kg)   BMI 26.70 kg/m  Wt  Readings from Last 3 Encounters:  03/31/22 165 lb 6.4 oz (75 kg)  03/11/22 155 lb (70.3 kg)  03/03/22 155 lb 4.8 oz (70.4 kg)     Health Maintenance Due  Topic Date Due   Zoster Vaccines- Shingrix (1 of 2) Never done   Pneumonia Vaccine 82+ Years old (2 - PPSV23 or PCV20) 07/01/2014   COVID-19 Vaccine (4 - Pfizer risk series) 07/05/2020   INFLUENZA VACCINE  02/18/2022    There are no preventive care reminders to display for this patient.  Lab Results  Component Value Date   TSH 1.73 06/04/2021   Lab Results  Component Value Date   WBC 8.3 06/04/2021   HGB 14.1 06/04/2021   HCT 42.0 06/04/2021   MCV 97.2 06/04/2021   PLT 318 06/04/2021   Lab Results  Component Value Date   NA 142 06/04/2021   K 4.3 06/04/2021   CO2 30 06/04/2021   GLUCOSE 94 06/04/2021   BUN 16 06/04/2021   CREATININE 0.65 06/04/2021   BILITOT 0.6 06/04/2021   ALKPHOS 48 03/29/2019   AST 12 06/04/2021   ALT 17 06/04/2021   PROT 6.0 (L) 06/04/2021   ALBUMIN 4.0 03/29/2019   CALCIUM 9.6 06/04/2021   ANIONGAP 9 03/29/2019   EGFR 87 06/04/2021   Lab Results  Component Value Date   CHOL 196 06/04/2021   Lab Results  Component Value Date   HDL 70 06/04/2021   Lab Results  Component Value Date   LDLCALC 107 (H) 06/04/2021   Lab Results  Component Value  Date   TRIG 97 06/04/2021   Lab Results  Component Value Date   CHOLHDL 2.8 06/04/2021   No results found for: "HGBA1C"    Assessment & Plan:   Problem List Items Addressed This Visit       Cardiovascular and Mediastinum   Chronic venous insufficiency    Patient has a PAD, patient has a segmental reflux of the right great saphenous vein and deep venous system, we will apply compression ace bandage      Essential hypertension     Patient denies any chest pain or shortness of breath there is no history of palpitation or paroxysmal nocturnal dyspnea   patient was advised to follow low-salt low-cholesterol diet    ideally I want  to keep systolic blood pressure below 130 mmHg, patient was asked to check blood pressure one times a week and give me a report on that.  Patient will be follow-up in 3 months  or earlier as needed, patient will call me back for any change in the cardiovascular symptoms Patient was advised to buy a book from local bookstore concerning blood pressure and read several chapters  every day.  This will be supplemented by some of the material we will give him from the office.  Patient should also utilize other resources like YouTube and Internet to learn more about the blood pressure and the diet.      Chronic systolic (congestive) heart failure (HCC) - Primary    Stable at the present time, we will stop the Entresto because it may be causing the rash      Relevant Orders   CBC with Differential/Platelet   COMPLETE METABOLIC PANEL WITH GFR   TSH     Respiratory   Chronic obstructive pulmonary disease ([place])    Patient does not have any wheezing        Endocrine   Hypothyroidism due to Hashimoto's thyroiditis    We will check TSH       No orders of the defined types were placed in this encounter.   Follow-up: No follow-ups on file.    Cletis Athens, MD

## 2022-03-31 NOTE — Assessment & Plan Note (Signed)
Patient does not have any wheezing

## 2022-03-31 NOTE — Assessment & Plan Note (Signed)
Patient has a PAD, patient has a segmental reflux of the right great saphenous vein and deep venous system, we will apply compression ace bandage

## 2022-04-01 ENCOUNTER — Ambulatory Visit: Payer: PPO | Admitting: Dermatology

## 2022-04-01 ENCOUNTER — Encounter: Payer: Self-pay | Admitting: Dermatology

## 2022-04-01 DIAGNOSIS — D692 Other nonthrombocytopenic purpura: Secondary | ICD-10-CM | POA: Diagnosis not present

## 2022-04-01 DIAGNOSIS — I872 Venous insufficiency (chronic) (peripheral): Secondary | ICD-10-CM

## 2022-04-01 LAB — CBC WITH DIFFERENTIAL/PLATELET
Absolute Monocytes: 650 cells/uL (ref 200–950)
Basophils Absolute: 36 cells/uL (ref 0–200)
Basophils Relative: 0.4 %
Eosinophils Absolute: 62 cells/uL (ref 15–500)
Eosinophils Relative: 0.7 %
HCT: 33.9 % — ABNORMAL LOW (ref 35.0–45.0)
Hemoglobin: 11.7 g/dL (ref 11.7–15.5)
Lymphs Abs: 1522 cells/uL (ref 850–3900)
MCH: 34.2 pg — ABNORMAL HIGH (ref 27.0–33.0)
MCHC: 34.5 g/dL (ref 32.0–36.0)
MCV: 99.1 fL (ref 80.0–100.0)
MPV: 10.7 fL (ref 7.5–12.5)
Monocytes Relative: 7.3 %
Neutro Abs: 6631 cells/uL (ref 1500–7800)
Neutrophils Relative %: 74.5 %
Platelets: 386 10*3/uL (ref 140–400)
RBC: 3.42 10*6/uL — ABNORMAL LOW (ref 3.80–5.10)
RDW: 12.1 % (ref 11.0–15.0)
Total Lymphocyte: 17.1 %
WBC: 8.9 10*3/uL (ref 3.8–10.8)

## 2022-04-01 LAB — COMPLETE METABOLIC PANEL WITH GFR
AG Ratio: 1.7 (calc) (ref 1.0–2.5)
ALT: 14 U/L (ref 6–29)
AST: 13 U/L (ref 10–35)
Albumin: 3.5 g/dL — ABNORMAL LOW (ref 3.6–5.1)
Alkaline phosphatase (APISO): 44 U/L (ref 37–153)
BUN: 16 mg/dL (ref 7–25)
CO2: 27 mmol/L (ref 20–32)
Calcium: 8.6 mg/dL (ref 8.6–10.4)
Chloride: 108 mmol/L (ref 98–110)
Creat: 0.8 mg/dL (ref 0.60–0.95)
Globulin: 2.1 g/dL (calc) (ref 1.9–3.7)
Glucose, Bld: 114 mg/dL — ABNORMAL HIGH (ref 65–99)
Potassium: 3.6 mmol/L (ref 3.5–5.3)
Sodium: 145 mmol/L (ref 135–146)
Total Bilirubin: 0.4 mg/dL (ref 0.2–1.2)
Total Protein: 5.6 g/dL — ABNORMAL LOW (ref 6.1–8.1)
eGFR: 73 mL/min/{1.73_m2} (ref >=60)

## 2022-04-01 LAB — TSH: TSH: 1.76 mIU/L (ref 0.40–4.50)

## 2022-04-01 NOTE — Progress Notes (Signed)
   Follow-Up Visit   Subjective  Teresa Chambers is a 86 y.o. female who presents for the following: Skin Discoloration (Arms and legs. Dur: 2 weeks. PCP recommended check up with dermatologist. No hx of treatment. Non tender. C/O blisters at feet. Has neuropathy, unaware if painful. Thought was fire ant bites, but has not been outside).  The following portions of the chart were reviewed this encounter and updated as appropriate:  Tobacco  Allergies  Meds  Problems  Med Hx  Surg Hx  Fam Hx      Review of Systems: No other skin or systemic complaints except as noted in HPI or Assessment and Plan.   Objective  Well appearing patient in no apparent distress; mood and affect are within normal limits.  A focused examination was performed including upper extremities, including the arms, hands, fingers, and fingernails and lower extremities, including the legs, feet, toes, and toenails. Relevant physical exam findings are noted in the Assessment and Plan.  legs Erythematous, scaly patches involving the ankle and distal lower leg with associated lower leg edema. Resolving bullae at B/L feet   Assessment & Plan   Purpura - Chronic; persistent and recurrent.  Treatable, but not curable. Arms and legs - Violaceous macules and patches - Benign - Related to trauma, age, sun damage and/or use of blood thinners, chronic use of topical and/or oral steroids - Observe - Can use OTC arnica containing moisturizer such as Dermend Bruise Formula if desired - Call for worsening or other concerns  Venous stasis dermatitis of left lower extremity legs  Bullae most consistent with edema bullae today. She reports more recent swelling.  Stasis in the legs causes chronic leg swelling, which may result in itchy or painful rashes, skin discoloration, skin texture changes, and sometimes ulceration.  Recommend daily graduated compression hose/stockings- easiest to put on first thing in morning, remove at  bedtime.  Elevate legs as much as possible. Avoid salt/sodium rich foods.  Wear compression stockings per Dr. Lucky Cowboy.  Wash once daily with soap and water.  Spray with Hypochlorous Spray. Apply Vaseline Jelly to open/healing wounds.   Recommend [facility] Hypochlorous Spray (found in the wound care section) OR Cln brand Acne or Sports wash. The [facility] Hypochlorous Spray can be sprayed on daily and left on. The Cln wash should be applied to the affected area daily for at least 30 seconds and then rinsed off. If you are using clindamycin solution or lotion or another topical antibiotic to treat acne, using a hypochlorous product may help lower the risk of antibiotic resistant bacteria.    Recent labs reviewed.   If she develops tenderness, itch, or red rash, RTC to evaluate and consider adding topical steroid or calcineurin inhibitor  Reassured discoloration is benign  If bullae recur, return for evaluation  Recommend continuing compression stockings and leg elevation    Return if symptoms worsen or fail to improve, for TBSE As Scheduled.  I, Emelia Salisbury, CMA, am acting as scribe for Forest Gleason, MD.  Documentation: I have reviewed the above documentation for accuracy and completeness, and I agree with the above.  Forest Gleason, MD

## 2022-04-01 NOTE — Patient Instructions (Addendum)
Stasis in the legs causes chronic leg swelling, which may result in itchy or painful rashes, skin discoloration, skin texture changes, and sometimes ulceration.  Recommend daily graduated compression hose/stockings- easiest to put on first thing in morning, remove at bedtime.  Elevate legs as much as possible. Avoid salt/sodium rich foods.  Wear compression stockings per Dr. Lucky Cowboy.  LEGS/FEET: Wash once daily with soap and water.  Spray with Hypochlorous Spray. Apply Vaseline Jelly to open/healing wounds.   Recommend [facility] Hypochlorous Spray (found in the wound care section) OR Cln brand Acne or Sports wash. The [facility] Hypochlorous Spray can be sprayed on daily and left on. The Cln wash should be applied to the affected area daily for at least 30 seconds and then rinsed off. If you are using clindamycin solution or lotion or another topical antibiotic to treat acne, using a hypochlorous product may help lower the risk of antibiotic resistant bacteria.      Due to recent changes in healthcare laws, you may see results of your pathology and/or laboratory studies on MyChart before the doctors have had a chance to review them. We understand that in some cases there may be results that are confusing or concerning to you. Please understand that not all results are received at the same time and often the doctors may need to interpret multiple results in order to provide you with the best plan of care or course of treatment. Therefore, we ask that you please give [place] 2 business days to thoroughly review all your results before contacting the office for clarification. Should we see a critical lab result, you will be contacted sooner.   If You Need Anything After Your Visit  If you have any questions or concerns for your doctor, please call our main line at [phone] and press option 4 to reach your doctor's medical assistant. If no one answers, please leave a voicemail as directed and we will return  your call as soon as possible. Messages left after 4 pm will be answered the following business day.   You may also send [place] a message via [place]. We typically respond to MyChart messages within 1-2 business days.  For prescription refills, please ask your pharmacy to contact our office. Our fax number is [phone].  If you have an urgent issue when the clinic is closed that cannot wait until the next business day, you can page your doctor at the number below.    Please note that while we do our best to be available for urgent issues outside of office hours, we are not available 24/7.   If you have an urgent issue and are unable to reach [place], you may choose to seek medical care at your doctor's office, retail clinic, urgent care center, or emergency room.  If you have a medical emergency, please immediately call 911 or go to the emergency department.  Pager Numbers  - Dr. Nehemiah Massed: [phone]  - Dr. Laurence Ferrari: [phone]  - Dr. Nicole Kindred: [phone]  In the event of inclement weather, please call our main line at [phone] for an update on the status of any delays or closures.  Dermatology Medication Tips: Please keep the boxes that topical medications come in in order to help keep track of the instructions about where and how to use these. Pharmacies typically print the medication instructions only on the boxes and not directly on the medication tubes.   If your medication is too expensive, please contact our office at [phone] option 4 or  send [place] a message through [place].   We are unable to tell what your co-pay for medications will be in advance as this is different depending on your insurance coverage. However, we may be able to find a substitute medication at lower cost or fill out paperwork to get insurance to cover a needed medication.   If a prior authorization is required to get your medication covered by your insurance company, please allow [place] 1-2 business days to  complete this process.  Drug prices often vary depending on where the prescription is filled and some pharmacies may offer cheaper prices.  The website www.goodrx.com contains coupons for medications through different pharmacies. The prices here do not account for what the cost may be with help from insurance (it may be cheaper with your insurance), but the website can give you the price if you did not use any insurance.  - You can print the associated coupon and take it with your prescription to the pharmacy.  - You may also stop by our office during regular business hours and pick up a GoodRx coupon card.  - If you need your prescription sent electronically to a different pharmacy, notify our office through [facility] or by phone at [phone] option 4.     Si Usted Necesita Algo Despus de Su Visita  Tambin puede enviarnos un mensaje a travs de Pharmacist, community. Por lo general respondemos a los mensajes de MyChart en el transcurso de 1 a 2 das hbiles.  Para renovar recetas, por favor pida a su farmacia que se ponga en contacto con nuestra oficina. Harland Dingwall de fax es [employer] [phone].  Si tiene un asunto urgente cuando la clnica est cerrada y que no puede esperar hasta el siguiente da hbil, puede llamar/localizar a su doctor(a) al nmero que aparece a continuacin.   Por favor, tenga en cuenta que aunque hacemos todo lo posible para estar disponibles para asuntos urgentes fuera del horario de [place], no estamos disponibles las 24 horas del da, los 7 das de la [place].   Si tiene un problema urgente y no puede comunicarse con nosotros, puede optar por buscar atencin mdica  en el consultorio de su doctor(a), en una clnica privada, en un centro de atencin urgente o en una sala de emergencias.  Si tiene Engineering geologist, por favor llame inmediatamente al 911 o vaya a la sala de emergencias.  Nmeros de bper  - Dr. Nehemiah Massed: [phone]  - Dra. Moye:  [phone]  - Dra. Nicole Kindred: [phone]  En caso de inclemencias del [place], por favor llame a Johnsie Kindred principal al [phone] para una actualizacin sobre el [place] de cualquier retraso o cierre.  Consejos para la medicacin en dermatologa: Por favor, guarde las cajas en las que vienen los medicamentos de uso tpico para ayudarle a seguir las instrucciones sobre dnde y cmo usarlos. Las farmacias generalmente imprimen las instrucciones del medicamento slo en las cajas y no directamente en los tubos del [place].   Si su medicamento es muy caro, por favor, pngase en contacto con Zigmund Daniel llamando al [phone] y presione la opcin 4 o envenos un mensaje a travs de Pharmacist, community.   No podemos decirle cul ser su copago por los medicamentos por adelantado ya que esto es diferente dependiendo de la cobertura de su seguro. Sin embargo, es posible que podamos encontrar un medicamento sustituto a Electrical engineer un formulario para que el seguro cubra el medicamento que se considera necesario.   Si  se requiere una autorizacin previa para que su compaa de seguros [place] su medicamento, por favor permtanos de 1 a 2 das hbiles para completar este proceso.  Los precios de los medicamentos varan con frecuencia dependiendo del Environmental consultant de dnde se surte la receta y alguna farmacias pueden ofrecer precios ms baratos.  El sitio web www.goodrx.com tiene cupones para medicamentos de Airline pilot. Los precios aqu no tienen en cuenta lo que podra costar con la ayuda del seguro (puede ser ms barato con su seguro), pero el sitio web puede darle el precio si no utiliz Research scientist (physical sciences).  - Puede imprimir el cupn correspondiente y llevarlo con su receta a la farmacia.  - Tambin puede pasar por nuestra oficina durante el horario de atencin regular y Charity fundraiser una tarjeta de cupones de GoodRx.  - Si necesita que su receta se enve electrnicamente a una farmacia diferente,  informe a nuestra oficina a travs de MyChart de [place] o por telfono llamando al [phone] y presione la opcin 4.

## 2022-04-07 ENCOUNTER — Encounter: Payer: Self-pay | Admitting: Dermatology

## 2022-04-07 ENCOUNTER — Other Ambulatory Visit: Payer: Self-pay | Admitting: Internal Medicine

## 2022-04-07 ENCOUNTER — Ambulatory Visit: Payer: PPO | Admitting: Internal Medicine

## 2022-04-14 DIAGNOSIS — M21062 Valgus deformity, not elsewhere classified, left knee: Secondary | ICD-10-CM | POA: Diagnosis not present

## 2022-04-14 DIAGNOSIS — M170 Bilateral primary osteoarthritis of knee: Secondary | ICD-10-CM | POA: Diagnosis not present

## 2022-04-21 ENCOUNTER — Ambulatory Visit (INDEPENDENT_AMBULATORY_CARE_PROVIDER_SITE_OTHER): Payer: PPO | Admitting: Internal Medicine

## 2022-04-21 ENCOUNTER — Encounter: Payer: Self-pay | Admitting: Internal Medicine

## 2022-04-21 VITALS — BP 110/64 | HR 90 | Ht 66.0 in | Wt 159.7 lb

## 2022-04-21 DIAGNOSIS — I5082 Biventricular heart failure: Secondary | ICD-10-CM

## 2022-04-21 DIAGNOSIS — I10 Essential (primary) hypertension: Secondary | ICD-10-CM

## 2022-04-21 DIAGNOSIS — Z23 Encounter for immunization: Secondary | ICD-10-CM | POA: Diagnosis not present

## 2022-04-21 DIAGNOSIS — J449 Chronic obstructive pulmonary disease, unspecified: Secondary | ICD-10-CM

## 2022-04-21 NOTE — Assessment & Plan Note (Signed)
Stable at the present time. 

## 2022-04-21 NOTE — Assessment & Plan Note (Signed)

## 2022-04-21 NOTE — Progress Notes (Signed)
Established Patient Office Visit  Subjective:  Patient ID: Teresa Chambers, female    DOB: [birth year]  Age: 86 y.o. MRN: [id number]  CC:  Chief Complaint  Patient presents with   Follow-up    HPI  Teresa Chambers presents for check up    I  Past Medical History:  Diagnosis Date   Actinic keratosis    Acute thoracic back pain    Asthma    Basal cell carcinoma    nose    Chest pain    unspecified   CHF (congestive heart failure) ([place]) 04/30/2017   Chronic abdominal pain 05/01/2017   unspecified   Congenital heart failure (HCC)    COPD (chronic obstructive pulmonary disease) ([place])    Disease of upper respiratory system 04/30/2017   Dyspnea on exertion    Esophageal reflux disease 04/30/2017   History of bone density study 06/28/2004   Leg swelling    Lumbar arthropathy    Nasopharyngitis 04/30/2017   Osteoarthropathy 04/30/2017   Osteoporosis 04/30/2017   Palpitations    Sinusitis, acute 04/30/2017   unspecified   Squamous cell carcinoma of skin 07/28/2017   Right medial knee. Hypertrophic SCCis. Tx: [name] 07/28/2017    Past Surgical History:  Procedure Laterality Date   CATARACT EXTRACTION     07/21/2001 - 07/20/2002   CHOLECYSTECTOMY     07/21/1997 - 07/20/1998   COLON SURGERY     COLONOSCOPY     05/05/2000, 01/17/2000 Adenomatous Polyps     COLONOSCOPY     11/05/2009, 12/23/2004, 07/07/2001   PH Adenomatous Polyps: CBF 10/2014; recall ltr mailed 09/18/2014 (dw)    ESOPHAGOGASTRODUODENOSCOPY     11/05/2009, 05/05/2000   FLEXIBLE SIGMOIDOSCOPY  11/28/1999   HAND SURGERY  2006   HERNIA REPAIR     HIP ARTHROPLASTY Right 02/11/2018   Procedure: ARTHROPLASTY BIPOLAR HIP (HEMIARTHROPLASTY);  Surgeon: Corky Mull, MD;  Location: [facility] ORS;  Service: Orthopedics;  Laterality: Right;   LAPAROSCOPIC COLON RESECTION     2001    Family History  Problem Relation Age of Onset   Heart disease Mother    Heart disease Father    Hypertension Son    Hypertension Daughter      Social History   Socioeconomic History   Marital status: Divorced    Spouse name: Not on file   Number of children: 5   Years of education: 15.5   Highest education level: High school graduate  Occupational History   Occupation: retired  Tobacco Use   Smoking status: Never   Smokeless tobacco: Never  Scientific laboratory technician Use: Never used  Substance and Sexual Activity   Alcohol use: Not Currently   Drug use: Never   Sexual activity: Not Currently  Other Topics Concern   Not on file  Social History Narrative   Lives independently, has home at [facility] she enjoys traveling to   Social Determinants of Health   Financial Resource Strain: [place]  (06/07/2021)   Overall Financial Resource Strain (CARDIA)    Difficulty of Paying Living Expenses: Not hard at all  Food Insecurity: No Food Insecurity (06/07/2021)   Hunger Vital Sign    Worried About Running Out of Food in the Last Year: Never true    [place] in the Last Year: Never true  Transportation Needs: No Transportation Needs (03/09/2018)   PRAPARE - Hydrologist (Medical): No    Lack of Transportation (Non-Medical):  No  Physical Activity: Inactive (06/07/2021)   Exercise Vital Sign    Days of Exercise per Week: 0 days    Minutes of Exercise per Session: 0 min  Stress: No Stress Concern Present (06/07/2021)   [employer] of Occupational Health - Occupational Stress Questionnaire    Feeling of Stress : Only a little  Social Connections: Socially Isolated (06/07/2021)   Social Connection and Isolation Panel [NHANES]    Frequency of Communication with Friends and Family: More than three times a week    Frequency of Social Gatherings with Friends and Family: More than three times a week    Attends Religious Services: Never    Database administrator or Organizations: No    Attends Banker Meetings: Never    Marital Status: Divorced  Catering manager Violence:  Not At Risk (06/07/2021)   Humiliation, Afraid, Rape, and Kick questionnaire    Fear of Current or Ex-Partner: No    Emotionally Abused: No    Physically Abused: No    Sexually Abused: No     Current Outpatient Medications:    acetaminophen (TYLENOL 8 HOUR ARTHRITIS PAIN) 650 MG CR tablet, , Disp: , Rfl:    Ascorbic Acid (VITAMIN C WITH ROSE HIPS) 1000 MG tablet, Take 1,000 mg by mouth daily., Disp: , Rfl:    BREO ELLIPTA 200-25 MCG/ACT AEPB, INHALE 1 PUFF INTO THE LUNGS DAILY, Disp: 60 each, Rfl: 2   Calcium Carbonate-Vitamin D 600-400 MG-UNIT tablet, Take 1 tablet by mouth daily., Disp: , Rfl:    clopidogrel (PLAVIX) 75 MG tablet, Take 1 tablet (75 mg total) by mouth daily., Disp: 90 tablet, Rfl: 3   ENTRESTO 24-26 MG, TAKE 1 TABLET BY MOUTH TWICE DAILY, Disp: 180 tablet, Rfl: 1   ezetimibe (ZETIA) 10 MG tablet, TAKE 1 TABLET(10 MG) BY MOUTH DAILY, Disp: 90 tablet, Rfl: 3   fluticasone (FLONASE) 50 MCG/ACT nasal spray, SHAKE LIQUID AND USE 2 SPRAYS IN EACH NOSTRIL DAILY, Disp: 48 g, Rfl: 6   levothyroxine (SYNTHROID) 50 MCG tablet, TAKE 1 TABLET(50 MCG) BY MOUTH DAILY, Disp: 90 tablet, Rfl: 3   pantoprazole (PROTONIX) 40 MG tablet, TAKE 1 TABLET(40 MG) BY MOUTH DAILY, Disp: 90 tablet, Rfl: 3   pregabalin (LYRICA) 50 MG capsule, TAKE ONE CAPSULE BY MOUTH TWICE DAILY, Disp: 60 capsule, Rfl: 1   tiZANidine (ZANAFLEX) 4 MG tablet, Take by mouth., Disp: , Rfl:    traMADol (ULTRAM) 50 MG tablet, , Disp: , Rfl:    Allergies  Allergen Reactions   Codeine Nausea And Vomiting   Lidocaine Swelling and Anaphylaxis    ROS Review of Systems  Constitutional: Negative.   HENT: Negative.    Eyes: Negative.   Respiratory: Negative.    Cardiovascular: Negative.   Gastrointestinal: Negative.   Endocrine: Negative.   Genitourinary: Negative.   Musculoskeletal: Negative.   Skin: Negative.   Allergic/Immunologic: Negative.   Neurological: Negative.   Hematological: Negative.    Psychiatric/Behavioral: Negative.    All other systems reviewed and are negative.     Objective:    Physical Exam Vitals reviewed.  Constitutional:      Appearance: Normal appearance.  HENT:     Mouth/Throat:     Mouth: Mucous membranes are moist.  Eyes:     Pupils: Pupils are equal, round, and reactive to light.  Neck:     Vascular: No carotid bruit.  Cardiovascular:     Rate and Rhythm: Normal rate and regular rhythm.  Pulses: Normal pulses.     Heart sounds: Normal heart sounds.  Pulmonary:     Effort: Pulmonary effort is normal.     Breath sounds: Normal breath sounds.  Abdominal:     General: Bowel sounds are normal.     Palpations: Abdomen is soft. There is no hepatomegaly, splenomegaly or mass.     Tenderness: There is no abdominal tenderness.     Hernia: No hernia is present.  Musculoskeletal:        General: No tenderness.     Cervical back: Neck supple.     Right lower leg: No edema.     Left lower leg: No edema.  Skin:    Findings: No rash.  Neurological:     Mental Status: She is alert and oriented to person, place, and time.     Motor: No weakness.  Psychiatric:        Mood and Affect: Mood and affect normal.        Behavior: Behavior normal.     BP 110/64   Pulse 90   Ht $R'5\' 6"'TC$  (1.676 m)   Wt 159 lb 11.2 oz (72.4 kg)   BMI 25.78 kg/m  Wt Readings from Last 3 Encounters:  04/21/22 159 lb 11.2 oz (72.4 kg)  03/31/22 165 lb 6.4 oz (75 kg)  03/11/22 155 lb (70.3 kg)     Health Maintenance Due  Topic Date Due   COVID-19 Vaccine (5 - J[place]sen risk series) 07/05/2020    There are no preventive care reminders to display for this patient.  Lab Results  Component Value Date   TSH 1.76 03/31/2022   Lab Results  Component Value Date   WBC 8.9 03/31/2022   HGB 11.7 03/31/2022   HCT 33.9 (L) 03/31/2022   MCV 99.1 03/31/2022   PLT 386 03/31/2022   Lab Results  Component Value Date   NA 145 03/31/2022   K 3.6 03/31/2022   CO2 27  03/31/2022   GLUCOSE 114 (H) 03/31/2022   BUN 16 03/31/2022   CREATININE 0.80 03/31/2022   BILITOT 0.4 03/31/2022   ALKPHOS 48 03/29/2019   AST 13 03/31/2022   ALT 14 03/31/2022   PROT 5.6 (L) 03/31/2022   ALBUMIN 4.0 03/29/2019   CALCIUM 8.6 03/31/2022   ANIONGAP 9 03/29/2019   EGFR 73 03/31/2022   Lab Results  Component Value Date   CHOL 196 06/04/2021   Lab Results  Component Value Date   HDL 70 06/04/2021   Lab Results  Component Value Date   LDLCALC 107 (H) 06/04/2021   Lab Results  Component Value Date   TRIG 97 06/04/2021   Lab Results  Component Value Date   CHOLHDL 2.8 06/04/2021   No results found for: "HGBA1C"    Assessment & Plan:   Problem List Items Addressed This Visit       Cardiovascular and Mediastinum   CHF (congestive heart failure) ([place])    Stable at the present time      Essential hypertension     Patient denies any chest pain or shortness of breath there is no history of palpitation or paroxysmal nocturnal dyspnea   patient was advised to follow low-salt low-cholesterol diet    ideally I want to keep systolic blood pressure below 130 mmHg, patient was asked to check blood pressure one times a week and give me a report on that.  Patient will be follow-up in 3 months  or earlier as needed, patient will call  me back for any change in the cardiovascular symptoms Patient was advised to buy a book from local bookstore concerning blood pressure and read several chapters  every day.  This will be supplemented by some of the material we will give him from the office.  Patient should also utilize other resources like YouTube and Internet to learn more about the blood pressure and the diet.        Respiratory   Chronic obstructive pulmonary disease (HCC)    Stable at the present time      Other Visit Diagnoses     Need for influenza vaccination    -  Primary   Relevant Orders   Flu Vaccine QUAD High Dose(Fluad) (Completed)     Need for  influenza vaccination - Plan: Flu Vaccine QUAD High Dose(Fluad)  Biventricular congestive heart failure ([place])  Essential hypertension  Chronic obstructive pulmonary disease, unspecified COPD type ([place])  No orders of the defined types were placed in this encounter.   Follow-up: No follow-ups on file.    Cletis Athens, MD

## 2022-04-30 ENCOUNTER — Ambulatory Visit (INDEPENDENT_AMBULATORY_CARE_PROVIDER_SITE_OTHER): Payer: PPO

## 2022-04-30 ENCOUNTER — Encounter (INDEPENDENT_AMBULATORY_CARE_PROVIDER_SITE_OTHER): Payer: Self-pay | Admitting: Nurse Practitioner

## 2022-04-30 ENCOUNTER — Ambulatory Visit (INDEPENDENT_AMBULATORY_CARE_PROVIDER_SITE_OTHER): Payer: PPO | Admitting: Nurse Practitioner

## 2022-04-30 VITALS — BP 131/80 | HR 93 | Resp 16 | Wt 160.0 lb

## 2022-04-30 DIAGNOSIS — I5082 Biventricular heart failure: Secondary | ICD-10-CM

## 2022-04-30 DIAGNOSIS — I890 Lymphedema, not elsewhere classified: Secondary | ICD-10-CM | POA: Diagnosis not present

## 2022-04-30 DIAGNOSIS — I10 Essential (primary) hypertension: Secondary | ICD-10-CM | POA: Diagnosis not present

## 2022-04-30 DIAGNOSIS — I872 Venous insufficiency (chronic) (peripheral): Secondary | ICD-10-CM | POA: Diagnosis not present

## 2022-05-02 ENCOUNTER — Encounter: Payer: Self-pay | Admitting: Nurse Practitioner

## 2022-05-02 ENCOUNTER — Ambulatory Visit (INDEPENDENT_AMBULATORY_CARE_PROVIDER_SITE_OTHER): Payer: PPO | Admitting: Nurse Practitioner

## 2022-05-02 VITALS — BP 133/75 | HR 88 | Ht 66.0 in | Wt 160.1 lb

## 2022-05-02 DIAGNOSIS — R002 Palpitations: Secondary | ICD-10-CM

## 2022-05-02 NOTE — Progress Notes (Signed)
Established Patient Office Visit  Subjective:  Patient ID: Teresa Chambers, female    DOB: 02-Nov-1935  Age: 86 y.o. MRN: [id number]  CC:  Chief Complaint  Patient presents with   Irregular Heart Beat    Patient was seen by Dr Bunnie Domino office yesterday and was told that she was in AFIB      HPI  Teresa Chambers presents for follow up. She denies any palpitation and chest pain at present.  HPI   Past Medical History:  Diagnosis Date   Actinic keratosis    Acute thoracic back pain    Asthma    Basal cell carcinoma    nose    Chest pain    unspecified   CHF (congestive heart failure) ([place]) 04/30/2017   Chronic abdominal pain 05/01/2017   unspecified   Congenital heart failure (HCC)    COPD (chronic obstructive pulmonary disease) (HCC)    Disease of upper respiratory system 04/30/2017   Dyspnea on exertion    Esophageal reflux disease 04/30/2017   History of bone density study 06/28/2004   Leg swelling    Lumbar arthropathy    Nasopharyngitis 04/30/2017   Osteoarthropathy 04/30/2017   Osteoporosis 04/30/2017   Palpitations    Sinusitis, acute 04/30/2017   unspecified   Squamous cell carcinoma of skin 07/28/2017   Right medial knee. Hypertrophic SCCis. Tx: [facility] 07/28/2017    Past Surgical History:  Procedure Laterality Date   CATARACT EXTRACTION     07/21/2001 - 07/20/2002   CHOLECYSTECTOMY     07/21/1997 - 07/20/1998   COLON SURGERY     COLONOSCOPY     05/05/2000, 01/17/2000 Adenomatous Polyps     COLONOSCOPY     11/05/2009, 12/23/2004, 07/07/2001   PH Adenomatous Polyps: CBF 10/2014; recall ltr mailed 09/18/2014 (dw)    ESOPHAGOGASTRODUODENOSCOPY     11/05/2009, 05/05/2000   FLEXIBLE SIGMOIDOSCOPY  11/28/1999   HAND SURGERY  2006   HERNIA REPAIR     HIP ARTHROPLASTY Right 02/11/2018   Procedure: ARTHROPLASTY BIPOLAR HIP (HEMIARTHROPLASTY);  Surgeon: Corky Mull, MD;  Location: [facility] ORS;  Service: Orthopedics;  Laterality: Right;   LAPAROSCOPIC COLON RESECTION      2001    Family History  Problem Relation Age of Onset   Heart disease Mother    Heart disease Father    Hypertension Son    Hypertension Daughter     Social History   Socioeconomic History   Marital status: Divorced    Spouse name: Not on file   Number of children: 5   Years of education: 15.5   Highest education level: High school graduate  Occupational History   Occupation: retired  Tobacco Use   Smoking status: Never   Smokeless tobacco: Never  Scientific laboratory technician Use: Never used  Substance and Sexual Activity   Alcohol use: Not Currently   Drug use: Never   Sexual activity: Not Currently  Other Topics Concern   Not on file  Social History Narrative   Lives independently, has home at [facility] she enjoys traveling to   Social Determinants of Health   Financial Resource Strain: [place]  (06/07/2021)   Overall Financial Resource Strain (CARDIA)    Difficulty of Paying Living Expenses: Not hard at all  Food Insecurity: No Food Insecurity (06/07/2021)   Hunger Vital Sign    Worried About Running Out of Food in the Last Year: Never true    [place] in the  Last Year: Never true  Transportation Needs: No Transportation Needs (03/09/2018)   PRAPARE - Hydrologist (Medical): No    Lack of Transportation (Non-Medical): No  Physical Activity: Inactive (06/07/2021)   Exercise Vital Sign    Days of Exercise per Week: 0 days    Minutes of Exercise per Session: 0 min  Stress: No Stress Concern Present (06/07/2021)   [place]    Feeling of Stress : Only a little  Social Connections: Socially Isolated (06/07/2021)   Social Connection and Isolation Panel [NHANES]    Frequency of Communication with Friends and Family: More than three times a week    Frequency of Social Gatherings with Friends and Family: More than three times a week    Attends Religious Services: Never     Marine scientist or Organizations: No    Attends Archivist Meetings: Never    Marital Status: Divorced  Human resources officer Violence: Not At Risk (06/07/2021)   Humiliation, Afraid, Rape, and Kick questionnaire    Fear of Current or Ex-Partner: No    Emotionally Abused: No    Physically Abused: No    Sexually Abused: No     Outpatient Medications Prior to Visit  Medication Sig Dispense Refill   acetaminophen (TYLENOL 8 HOUR ARTHRITIS PAIN) 650 MG CR tablet      Ascorbic Acid (VITAMIN C WITH ROSE HIPS) 1000 MG tablet Take 1,000 mg by mouth daily.     Calcium Carbonate-Vitamin D 600-400 MG-UNIT tablet Take 1 tablet by mouth daily.     clopidogrel (PLAVIX) 75 MG tablet Take 1 tablet (75 mg total) by mouth daily. 90 tablet 3   fluticasone (FLONASE) 50 MCG/ACT nasal spray SHAKE LIQUID AND USE 2 SPRAYS IN EACH NOSTRIL DAILY 48 g 6   levothyroxine (SYNTHROID) 50 MCG tablet TAKE 1 TABLET(50 MCG) BY MOUTH DAILY 90 tablet 3   pantoprazole (PROTONIX) 40 MG tablet TAKE 1 TABLET(40 MG) BY MOUTH DAILY 90 tablet 3   pregabalin (LYRICA) 50 MG capsule TAKE ONE CAPSULE BY MOUTH TWICE DAILY 60 capsule 1   tiZANidine (ZANAFLEX) 4 MG tablet Take by mouth.     traMADol (ULTRAM) 50 MG tablet      BREO ELLIPTA 200-25 MCG/ACT AEPB INHALE 1 PUFF INTO THE LUNGS DAILY 60 each 2   ENTRESTO 24-26 MG TAKE 1 TABLET BY MOUTH TWICE DAILY 180 tablet 1   ezetimibe (ZETIA) 10 MG tablet TAKE 1 TABLET(10 MG) BY MOUTH DAILY 90 tablet 3   No facility-administered medications prior to visit.    Allergies  Allergen Reactions   Codeine Nausea And Vomiting   Lidocaine Swelling and Anaphylaxis    ROS Review of Systems  Constitutional: Negative.   HENT: Negative.    Eyes: Negative.   Respiratory:  Positive for chest tightness.   Cardiovascular:  Positive for leg swelling.  Gastrointestinal: Negative.   Endocrine: Negative.   Genitourinary: Negative.   Musculoskeletal:  Positive for arthralgias.   Skin: Negative.   Neurological: Negative.   Psychiatric/Behavioral: Negative.        Objective:    Physical Exam Constitutional:      Appearance: Normal appearance. She is obese.  HENT:     Head: Normocephalic.     Right Ear: Tympanic membrane normal.     Left Ear: Tympanic membrane normal.     Nose: Nose normal.     Mouth/Throat:     Mouth:  Mucous membranes are moist.     Pharynx: Oropharynx is clear.  Eyes:     Extraocular Movements: Extraocular movements intact.     Conjunctiva/sclera: Conjunctivae normal.     Pupils: Pupils are equal, round, and reactive to light.  Cardiovascular:     Rate and Rhythm: Normal rate. Rhythm irregular.     Pulses: Normal pulses.     Heart sounds: Normal heart sounds.  Pulmonary:     Effort: Pulmonary effort is normal. No respiratory distress.     Breath sounds: Normal breath sounds. No rhonchi.  Abdominal:     General: Bowel sounds are normal.     Palpations: Abdomen is soft. There is no mass.     Tenderness: There is no abdominal tenderness.     Hernia: No hernia is present.  Musculoskeletal:        General: Normal range of motion.     Cervical back: Neck supple. No tenderness.  Skin:    General: Skin is warm.     Capillary Refill: Capillary refill takes less than 2 seconds.  Neurological:     General: No focal deficit present.     Mental Status: She is alert and oriented to person, place, and time. Mental status is at baseline.  Psychiatric:        Mood and Affect: Mood normal.        Behavior: Behavior normal.        Thought Content: Thought content normal.        Judgment: Judgment normal.     BP 133/75   Pulse 88   Ht 5' 6" (1.676 m)   Wt 160 lb 1.6 oz (72.6 kg)   BMI 25.84 kg/m  Wt Readings from Last 3 Encounters:  05/02/22 160 lb 1.6 oz (72.6 kg)  04/30/22 160 lb (72.6 kg)  04/21/22 159 lb 11.2 oz (72.4 kg)     Health Maintenance  Topic Date Due   COVID-19 Vaccine (5 - 2023-24 season) 03/21/2022   Medicare  Annual Wellness (AWV)  06/07/2022   Zoster Vaccines- Shingrix (1 of 2) 07/22/2022 (Originally 03/28/1955)   Pneumonia Vaccine 76+ Years old  Completed   INFLUENZA VACCINE  Completed   DEXA SCAN  Completed   HPV VACCINES  Aged Out    There are no preventive care reminders to display for this patient.  Lab Results  Component Value Date   TSH 1.76 03/31/2022   Lab Results  Component Value Date   WBC 8.9 03/31/2022   HGB 11.7 03/31/2022   HCT 33.9 (L) 03/31/2022   MCV 99.1 03/31/2022   PLT 386 03/31/2022   Lab Results  Component Value Date   NA 145 03/31/2022   K 3.6 03/31/2022   CO2 27 03/31/2022   GLUCOSE 114 (H) 03/31/2022   BUN 16 03/31/2022   CREATININE 0.80 03/31/2022   BILITOT 0.4 03/31/2022   ALKPHOS 48 03/29/2019   AST 13 03/31/2022   ALT 14 03/31/2022   PROT 5.6 (L) 03/31/2022   ALBUMIN 4.0 03/29/2019   CALCIUM 8.6 03/31/2022   ANIONGAP 9 03/29/2019   EGFR 73 03/31/2022   Lab Results  Component Value Date   CHOL 196 06/04/2021   Lab Results  Component Value Date   HDL 70 06/04/2021   Lab Results  Component Value Date   LDLCALC 107 (H) 06/04/2021   Lab Results  Component Value Date   TRIG 97 06/04/2021   Lab Results  Component Value Date   CHOLHDL 2.8  06/04/2021   No results found for: "HGBA1C"    Assessment & Plan:   Problem List Items Addressed This Visit       Other   Palpitation - Primary    Stopped plavix and start Eliquis 2.5 mg twice a day.  Sample provided for Eliquis 5 mg and advice patient to break into half.  Will start her on holter monitor       Relevant Orders   EKG 12-Lead     No orders of the defined types were placed in this encounter.    Follow-up: No follow-ups on file.    Theresia Lo, NP

## 2022-05-02 NOTE — Assessment & Plan Note (Signed)
Stopped plavix and start Eliquis 2.5 mg twice a day.  Sample provided for Eliquis 5 mg and advice patient to break into half.  Will start her on holter monitor

## 2022-05-02 NOTE — Patient Instructions (Signed)
Stop Plavix Take eliquis 2.5 mg twice daily.  We will do Holter monitor on Monday.

## 2022-05-05 ENCOUNTER — Encounter (INDEPENDENT_AMBULATORY_CARE_PROVIDER_SITE_OTHER): Payer: Self-pay | Admitting: Nurse Practitioner

## 2022-05-05 ENCOUNTER — Other Ambulatory Visit: Payer: Self-pay | Admitting: Internal Medicine

## 2022-05-05 NOTE — Progress Notes (Signed)
Subjective:    Patient ID: Teresa Chambers, female    DOB: March 19, 1936, 86 y.o.   MRN: [id number] Chief Complaint  Patient presents with   Follow-up    Ultrasound follow up    Patient returns today in follow up of her lower extremity swelling and discoloration.  She has noticed worsening swelling over the past several months.  This is associated with some increase of the purplish discoloration of her foot and ankle on each side.  No open wounds or infection.  She has easy bruising in both her arms and her legs.  She was recently given a prescription for Lyrica by her primary care provider but has really just started that this week.  She also has feet that are cold to the touch.  She has tingling and burning in her legs and her hands.  She has some known history of venous insufficiency but her symptoms were better and we have not checked a duplex on her in well over a year.  Today noninvasive studies show ABI of 1.32 bilaterally with normal TBI's bilaterally.  Patient has strong triphasic tibial artery waveforms bilaterally with good toe waveforms bilaterally.  This is consistent with previous studies.  There is reflux noted in the great saphenous vein at the mid thigh bilaterally.  No evidence of deep venous insufficiency or superficial venous reflux noted bilaterally.  This is actually improvement of the reflux that was noted bilaterally a year ago.    Review of Systems  Cardiovascular:  Positive for leg swelling.  All other systems reviewed and are negative.      Objective:   Physical Exam Vitals reviewed.  HENT:     Head: Normocephalic.  Cardiovascular:     Rate and Rhythm: Normal rate.     Pulses:          Dorsalis pedis pulses are 1+ on the right side and 1+ on the left side.  Pulmonary:     Effort: Pulmonary effort is normal.  Skin:    General: Skin is warm and dry.  Neurological:     Mental Status: She is alert and oriented to person, place, and time.  Psychiatric:         Mood and Affect: Mood normal.        Behavior: Behavior normal.        Thought Content: Thought content normal.        Judgment: Judgment normal.     BP 131/80 (BP Location: Left Arm)   Pulse 93   Resp 16   Wt 160 lb (72.6 kg)   BMI 25.82 kg/m   Past Medical History:  Diagnosis Date   Actinic keratosis    Acute thoracic back pain    Asthma    Basal cell carcinoma    nose    Chest pain    unspecified   CHF (congestive heart failure) ([place]) 04/30/2017   Chronic abdominal pain 05/01/2017   unspecified   Congenital heart failure (HCC)    COPD (chronic obstructive pulmonary disease) (HCC)    Disease of upper respiratory system 04/30/2017   Dyspnea on exertion    Esophageal reflux disease 04/30/2017   History of bone density study 06/28/2004   Leg swelling    Lumbar arthropathy    Nasopharyngitis 04/30/2017   Osteoarthropathy 04/30/2017   Osteoporosis 04/30/2017   Palpitations    Sinusitis, acute 04/30/2017   unspecified   Squamous cell carcinoma of skin 07/28/2017   Right medial knee. Hypertrophic  SCCis. Tx: [name] 07/28/2017    Social History   Socioeconomic History   Marital status: Divorced    Spouse name: Not on file   Number of children: 5   Years of education: 15.5   Highest education level: High school graduate  Occupational History   Occupation: retired  Tobacco Use   Smoking status: Never   Smokeless tobacco: Never  Vaping Use   Vaping Use: Never used  Substance and Sexual Activity   Alcohol use: Not Currently   Drug use: Never   Sexual activity: Not Currently  Other Topics Concern   Not on file  Social History Narrative   Lives independently, has home at [employer] she enjoys traveling to   Social Determinants of Health   Financial Resource Strain: [place]  (06/07/2021)   Overall Financial Resource Strain (CARDIA)    Difficulty of Paying Living Expenses: Not hard at all  Food Insecurity: No Food Insecurity (06/07/2021)   Hunger Vital Sign     Worried About Running Out of Food in the Last Year: Never true    [place] in the Last Year: Never true  Transportation Needs: No Transportation Needs (03/09/2018)   PRAPARE - Hydrologist (Medical): No    Lack of Transportation (Non-Medical): No  Physical Activity: Inactive (06/07/2021)   Exercise Vital Sign    Days of Exercise per Week: 0 days    Minutes of Exercise per Session: 0 min  Stress: No Stress Concern Present (06/07/2021)   [place]    Feeling of Stress : Only a little  Social Connections: Socially Isolated (06/07/2021)   Social Connection and Isolation Panel [NHANES]    Frequency of Communication with Friends and Family: More than three times a week    Frequency of Social Gatherings with Friends and Family: More than three times a week    Attends Religious Services: Never    Marine scientist or Organizations: No    Attends Archivist Meetings: Never    Marital Status: Divorced  Human resources officer Violence: Not At Risk (06/07/2021)   Humiliation, Afraid, Rape, and Kick questionnaire    Fear of Current or Ex-Partner: No    Emotionally Abused: No    Physically Abused: No    Sexually Abused: No    Past Surgical History:  Procedure Laterality Date   CATARACT EXTRACTION     07/21/2001 - 07/20/2002   CHOLECYSTECTOMY     07/21/1997 - 07/20/1998   COLON SURGERY     COLONOSCOPY     05/05/2000, 01/17/2000 Adenomatous Polyps     COLONOSCOPY     11/05/2009, 12/23/2004, 07/07/2001   PH Adenomatous Polyps: [facility] 10/2014; recall ltr mailed 09/18/2014 (dw)    ESOPHAGOGASTRODUODENOSCOPY     11/05/2009, 05/05/2000   FLEXIBLE SIGMOIDOSCOPY  11/28/1999   HAND SURGERY  2006   HERNIA REPAIR     HIP ARTHROPLASTY Right 02/11/2018   Procedure: ARTHROPLASTY BIPOLAR HIP (HEMIARTHROPLASTY);  Surgeon: Corky Mull, MD;  Location: [facility] ORS;  Service: Orthopedics;  Laterality: Right;    LAPAROSCOPIC COLON RESECTION     2001    Family History  Problem Relation Age of Onset   Heart disease Mother    Heart disease Father    Hypertension Son    Hypertension Daughter     Allergies  Allergen Reactions   Codeine Nausea And Vomiting   Lidocaine Swelling and Anaphylaxis  Latest Ref Rng & Units 03/31/2022   11:37 AM 06/04/2021    9:10 AM 06/04/2020   10:55 AM  CBC  WBC 3.8 - 10.8 Thousand/uL 8.9  8.3  6.5   Hemoglobin 11.7 - 15.5 g/dL 11.7  14.1  13.7   Hematocrit 35.0 - 45.0 % 33.9  42.0  40.8   Platelets 140 - 400 Thousand/uL 386  318  271       CMP     Component Value Date/Time   NA 145 03/31/2022 1137   K 3.6 03/31/2022 1137   CL 108 03/31/2022 1137   CO2 27 03/31/2022 1137   GLUCOSE 114 (H) 03/31/2022 1137   BUN 16 03/31/2022 1137   CREATININE 0.80 03/31/2022 1137   CALCIUM 8.6 03/31/2022 1137   PROT 5.6 (L) 03/31/2022 1137   ALBUMIN 4.0 03/29/2019 0951   AST 13 03/31/2022 1137   ALT 14 03/31/2022 1137   ALKPHOS 48 03/29/2019 0951   BILITOT 0.4 03/31/2022 1137   GFRNONAA 83 06/04/2020 1055   GFRAA 96 06/04/2020 1055     No results found.     Assessment & Plan:   1. Lymphedema The patient does have notable reflux in her bilateral saphenous veins but she does have multiple underlying issues that are edema.  Swelling.  I have discussed with the patient that she should continue with conservative therapy including use of her compression stockings, elevation and activity.  Pending results of this we will have the patient follow-up in 3 months to determine if proceeding with any further intervention for the reflux in her problem saphenous veins would be prudent or she should continue with conservative therapies.  2. Biventricular congestive heart failure ([place]) This also likely contributes to the patient's lower extremity edema.  3. Essential hypertension Continue antihypertensive medications as already ordered, these medications have been  reviewed and there are no changes at this time.    Current Outpatient Medications on File Prior to Visit  Medication Sig Dispense Refill   acetaminophen (TYLENOL 8 HOUR ARTHRITIS PAIN) 650 MG CR tablet      Ascorbic Acid (VITAMIN C WITH ROSE HIPS) 1000 MG tablet Take 1,000 mg by mouth daily.     BREO ELLIPTA 200-25 MCG/ACT AEPB INHALE 1 PUFF INTO THE LUNGS DAILY 60 each 2   Calcium Carbonate-Vitamin D 600-400 MG-UNIT tablet Take 1 tablet by mouth daily.     clopidogrel (PLAVIX) 75 MG tablet Take 1 tablet (75 mg total) by mouth daily. 90 tablet 3   ENTRESTO 24-26 MG TAKE 1 TABLET BY MOUTH TWICE DAILY 180 tablet 1   ezetimibe (ZETIA) 10 MG tablet TAKE 1 TABLET(10 MG) BY MOUTH DAILY 90 tablet 3   fluticasone (FLONASE) 50 MCG/ACT nasal spray SHAKE LIQUID AND USE 2 SPRAYS IN EACH NOSTRIL DAILY 48 g 6   levothyroxine (SYNTHROID) 50 MCG tablet TAKE 1 TABLET(50 MCG) BY MOUTH DAILY 90 tablet 3   pantoprazole (PROTONIX) 40 MG tablet TAKE 1 TABLET(40 MG) BY MOUTH DAILY 90 tablet 3   pregabalin (LYRICA) 50 MG capsule TAKE ONE CAPSULE BY MOUTH TWICE DAILY 60 capsule 1   tiZANidine (ZANAFLEX) 4 MG tablet Take by mouth.     traMADol (ULTRAM) 50 MG tablet      No current facility-administered medications on file prior to visit.    There are no Patient Instructions on file for this visit. No follow-ups on file.   Kris Hartmann, NP

## 2022-05-08 ENCOUNTER — Other Ambulatory Visit: Payer: Self-pay | Admitting: *Deleted

## 2022-05-08 ENCOUNTER — Ambulatory Visit: Payer: PPO | Admitting: Dermatology

## 2022-05-08 ENCOUNTER — Other Ambulatory Visit: Payer: Self-pay | Admitting: Internal Medicine

## 2022-05-08 ENCOUNTER — Encounter: Payer: Self-pay | Admitting: Dermatology

## 2022-05-08 DIAGNOSIS — B353 Tinea pedis: Secondary | ICD-10-CM | POA: Diagnosis not present

## 2022-05-08 DIAGNOSIS — E785 Hyperlipidemia, unspecified: Secondary | ICD-10-CM

## 2022-05-08 DIAGNOSIS — R238 Other skin changes: Secondary | ICD-10-CM | POA: Diagnosis not present

## 2022-05-08 MED ORDER — FLUTICASONE FUROATE-VILANTEROL 200-25 MCG/ACT IN AEPB: 1.0000 | INHALATION_SPRAY | Freq: Every day | RESPIRATORY_TRACT | 4 refills | Status: DC

## 2022-05-08 MED ORDER — MUPIROCIN 2 % EX OINT: 1.0000 | TOPICAL_OINTMENT | Freq: Every day | CUTANEOUS | 0 refills | Status: DC

## 2022-05-08 NOTE — Progress Notes (Signed)
   Follow-Up Visit   Subjective  Teresa Chambers is a 86 y.o. female who presents for the following: blisters (Patient here today for blisters at feet, present for about 2 days. Patient was started on Eliquis 05/02/22.).  Patient did have swelling at legs last week but has improved. She does not think she was swollen when blisters came up. She has not been outdoors in sandals and has not had any ibuprofen or naproxen recently. Patient started Lyrica about 1 month ago, no new shoes.  The following portions of the chart were reviewed this encounter and updated as appropriate:   Tobacco  Allergies  Meds  Problems  Med Hx  Surg Hx  Fam Hx      Review of Systems:  No other skin or systemic complaints except as noted in HPI or Assessment and Plan.  Objective  Well appearing patient in no apparent distress; mood and affect are within normal limits.  A focused examination was performed including feet, legs. Relevant physical exam findings are noted in the Assessment and Plan.  feet Tense bullae           feet Scaling and maceration web spaces and over distal and lateral soles.     Assessment & Plan  Bullae feet  Ddx bullous tinea vs edema bullae vs bullous bite rxn vs traumatic bullae > bullous pemphigoid  Patient not diabetic, no recent sun exposure to feet, new medication includes Eliquis but was not taking last time she got blisters. Patient noticed puffiness at feet prior to itch.   Recent hx of significant edema at lower extremities, nearly resolved today.  Given location prefer to avoid bx and will try conservative mgmt.  Start otc Lotrimin spray or terbinafine cream twice daily for 1 month over feet and in between toes. Start mupirocin to any open areas.  May continue hypochlorous spray.   2 tense bullae that were tender, cleansed with Hibaclens and drained with 27 gauge needle.  mupirocin ointment (BACTROBAN) 2 % - feet Apply 1 Application topically  daily.  Tinea pedis of right foot feet  With tinea unguium   Start otc Lotrimin spray or terbinafine cream twice daily for 1 month over feet and in between toes.   Return for 3-4 weeks .  Graciella Belton, RMA, am acting as scribe for Forest Gleason, MD .  Documentation: I have reviewed the above documentation for accuracy and completeness, and I agree with the above.  Forest Gleason, MD

## 2022-05-08 NOTE — Patient Instructions (Addendum)
Start over the counter Lotrimin spray or terbinafine cream twice daily over feet and in between toes. Start mupirocin to any open areas.  May continue hypochlorous spray.    Due to recent changes in healthcare laws, you may see results of your pathology and/or laboratory studies on MyChart before the doctors have had a chance to review them. We understand that in some cases there may be results that are confusing or concerning to you. Please understand that not all results are received at the same time and often the doctors may need to interpret multiple results in order to provide you with the best plan of care or course of treatment. Therefore, we ask that you please give [place] 2 business days to thoroughly review all your results before contacting the office for clarification. Should we see a critical lab result, you will be contacted sooner.   If You Need Anything After Your Visit  If you have any questions or concerns for your doctor, please call our main line at [phone] and press option 4 to reach your doctor's medical assistant. If no one answers, please leave a voicemail as directed and we will return your call as soon as possible. Messages left after 4 pm will be answered the following business day.   You may also send [place] a message via [place]. We typically respond to MyChart messages within 1-2 business days.  For prescription refills, please ask your pharmacy to contact our office. Our fax number is [phone].  If you have an urgent issue when the clinic is closed that cannot wait until the next business day, you can page your doctor at the number below.    Please note that while we do our best to be available for urgent issues outside of office hours, we are not available 24/7.   If you have an urgent issue and are unable to reach [place], you may choose to seek medical care at your doctor's office, retail clinic, urgent care center, or emergency room.  If you have a medical  emergency, please immediately call 911 or go to the emergency department.  Pager Numbers  - Dr. Nehemiah Massed: [phone]  - Dr. Laurence Ferrari: [phone]  - Dr. Nicole Kindred: [phone]  In the event of inclement weather, please call our main line at [phone] for an update on the status of any delays or closures.  Dermatology Medication Tips: Please keep the boxes that topical medications come in in order to help keep track of the instructions about where and how to use these. Pharmacies typically print the medication instructions only on the boxes and not directly on the medication tubes.   If your medication is too expensive, please contact our office at [phone] option 4 or send [place] a message through [place].   We are unable to tell what your co-pay for medications will be in advance as this is different depending on your insurance coverage. However, we may be able to find a substitute medication at lower cost or fill out paperwork to get insurance to cover a needed medication.   If a prior authorization is required to get your medication covered by your insurance company, please allow [place] 1-2 business days to complete this process.  Drug prices often vary depending on where the prescription is filled and some pharmacies may offer cheaper prices.  The website www.goodrx.com contains coupons for medications through different pharmacies. The prices here do not account for what the cost may be with help from insurance (it may be  cheaper with your insurance), but the website can give you the price if you did not use any insurance.  - You can print the associated coupon and take it with your prescription to the pharmacy.  - You may also stop by our office during regular business hours and pick up a GoodRx coupon card.  - If you need your prescription sent electronically to a different pharmacy, notify our office through [facility] or by phone at [phone] option 4.     Si Usted  Necesita Algo Despus de Su Visita  Tambin puede enviarnos un mensaje a travs de Pharmacist, community. Por lo general respondemos a los mensajes de MyChart en el transcurso de 1 a 2 das hbiles.  Para renovar recetas, por favor pida a su farmacia que se ponga en contacto con nuestra oficina. Harland Dingwall de fax es [place] [phone].  Si tiene un asunto urgente cuando la clnica est cerrada y que no puede esperar hasta el siguiente da hbil, puede llamar/localizar a su doctor(a) al nmero que aparece a continuacin.   Por favor, tenga en cuenta que aunque hacemos todo lo posible para estar disponibles para asuntos urgentes fuera del horario de [place], no estamos disponibles las 24 horas del da, los 7 das de la [place].   Si tiene un problema urgente y no puede comunicarse con nosotros, puede optar por buscar atencin mdica  en el consultorio de su doctor(a), en una clnica privada, en un centro de atencin urgente o en una sala de emergencias.  Si tiene Engineering geologist, por favor llame inmediatamente al 911 o vaya a la sala de emergencias.  Nmeros de bper  - Dr. Nehemiah Massed: [phone]  - Dra. Moye: [phone]  - Dra. Nicole Kindred: [phone]  En caso de inclemencias del [place], por favor llame a Johnsie Kindred principal al [phone] para una actualizacin sobre el [place] de cualquier retraso o cierre.  Consejos para la medicacin en dermatologa: Por favor, guarde las cajas en las que vienen los medicamentos de uso tpico para ayudarle a seguir las instrucciones sobre dnde y cmo usarlos. Las farmacias generalmente imprimen las instrucciones del medicamento slo en las cajas y no directamente en los tubos del [place].   Si su medicamento es muy caro, por favor, pngase en contacto con Zigmund Daniel llamando al [phone] y presione la opcin 4 o envenos un mensaje a travs de Pharmacist, community.   No podemos decirle cul ser su copago por los medicamentos por adelantado ya que esto es  diferente dependiendo de la cobertura de su seguro. Sin embargo, es posible que podamos encontrar un medicamento sustituto a Electrical engineer un formulario para que el seguro cubra el medicamento que se considera necesario.   Si se requiere una autorizacin previa para que su compaa de seguros [place] su medicamento, por favor permtanos de 1 a 2 das hbiles para completar este proceso.  Los precios de los medicamentos varan con frecuencia dependiendo del Environmental consultant de dnde se surte la receta y alguna farmacias pueden ofrecer precios ms baratos.  El sitio web www.goodrx.com tiene cupones para medicamentos de Airline pilot. Los precios aqu no tienen en cuenta lo que podra costar con la ayuda del seguro (puede ser ms barato con su seguro), pero el sitio web puede darle el precio si no utiliz Research scientist (physical sciences).  - Puede imprimir el cupn correspondiente y llevarlo con su receta a la farmacia.  - Tambin puede pasar por nuestra oficina durante el horario de atencin regular y Charity fundraiser  tarjeta de cupones de GoodRx.  - Si necesita que su receta se enve electrnicamente a una farmacia diferente, informe a nuestra oficina a travs de MyChart de [place] o por telfono llamando al [phone] y presione la opcin 4.  

## 2022-05-14 DIAGNOSIS — M7061 Trochanteric bursitis, right hip: Secondary | ICD-10-CM | POA: Diagnosis not present

## 2022-05-15 ENCOUNTER — Encounter: Payer: Self-pay | Admitting: Dermatology

## 2022-05-19 ENCOUNTER — Encounter (INDEPENDENT_AMBULATORY_CARE_PROVIDER_SITE_OTHER): Payer: Self-pay

## 2022-05-27 ENCOUNTER — Ambulatory Visit: Payer: PPO | Admitting: Internal Medicine

## 2022-06-03 ENCOUNTER — Ambulatory Visit: Payer: PPO | Admitting: Internal Medicine

## 2022-06-09 DIAGNOSIS — Z961 Presence of intraocular lens: Secondary | ICD-10-CM | POA: Diagnosis not present

## 2022-06-10 ENCOUNTER — Ambulatory Visit: Payer: PPO | Admitting: Dermatology

## 2022-06-10 ENCOUNTER — Ambulatory Visit (INDEPENDENT_AMBULATORY_CARE_PROVIDER_SITE_OTHER): Payer: PPO | Admitting: Vascular Surgery

## 2022-06-10 ENCOUNTER — Encounter: Payer: Self-pay | Admitting: Dermatology

## 2022-06-10 DIAGNOSIS — B353 Tinea pedis: Secondary | ICD-10-CM | POA: Diagnosis not present

## 2022-06-10 NOTE — Patient Instructions (Signed)
Continue Lotrimin Spray twice daily as directed.  Start Lamisil cream between toes.  Keep feet dry as much as possible.  Continue this for 1 more month then once a week for maintenance.   Use Mupirocin to any open sores    Due to recent changes in healthcare laws, you may see results of your pathology and/or laboratory studies on MyChart before the doctors have had a chance to review them. We understand that in some cases there may be results that are confusing or concerning to you. Please understand that not all results are received at the same time and often the doctors may need to interpret multiple results in order to provide you with the best plan of care or course of treatment. Therefore, we ask that you please give [place] 2 business days to thoroughly review all your results before contacting the office for clarification. Should we see a critical lab result, you will be contacted sooner.   If You Need Anything After Your Visit  If you have any questions or concerns for your doctor, please call our main line at [phone] and press option 4 to reach your doctor's medical assistant. If no one answers, please leave a voicemail as directed and we will return your call as soon as possible. Messages left after 4 pm will be answered the following business day.   You may also send [place] a message via [place]. We typically respond to MyChart messages within 1-2 business days.  For prescription refills, please ask your pharmacy to contact our office. Our fax number is [phone].  If you have an urgent issue when the clinic is closed that cannot wait until the next business day, you can page your doctor at the number below.    Please note that while we do our best to be available for urgent issues outside of office hours, we are not available 24/7.   If you have an urgent issue and are unable to reach [place], you may choose to seek medical care at your doctor's office, retail clinic, urgent care center,  or emergency room.  If you have a medical emergency, please immediately call 911 or go to the emergency department.  Pager Numbers  - Dr. Nehemiah Massed: [phone]  - Dr. Laurence Ferrari: [phone]  - Dr. Nicole Kindred: [phone]  In the event of inclement weather, please call our main line at [phone] for an update on the status of any delays or closures.  Dermatology Medication Tips: Please keep the boxes that topical medications come in in order to help keep track of the instructions about where and how to use these. Pharmacies typically print the medication instructions only on the boxes and not directly on the medication tubes.   If your medication is too expensive, please contact our office at [phone] option 4 or send [place] a message through [place].   We are unable to tell what your co-pay for medications will be in advance as this is different depending on your insurance coverage. However, we may be able to find a substitute medication at lower cost or fill out paperwork to get insurance to cover a needed medication.   If a prior authorization is required to get your medication covered by your insurance company, please allow [place] 1-2 business days to complete this process.  Drug prices often vary depending on where the prescription is filled and some pharmacies may offer cheaper prices.  The website www.goodrx.com contains coupons for medications through different pharmacies. The prices here do not  account for what the cost may be with help from insurance (it may be cheaper with your insurance), but the website can give you the price if you did not use any insurance.  - You can print the associated coupon and take it with your prescription to the pharmacy.  - You may also stop by our office during regular business hours and pick up a GoodRx coupon card.  - If you need your prescription sent electronically to a different pharmacy, notify our office through [facility] or by phone at  [phone] option 4.     Si Usted Necesita Algo Despus de Su Visita  Tambin puede enviarnos un mensaje a travs de Pharmacist, community. Por lo general respondemos a los mensajes de MyChart en el transcurso de 1 a 2 das hbiles.  Para renovar recetas, por favor pida a su farmacia que se ponga en contacto con nuestra oficina. Harland Dingwall de fax es Lillian [phone].  Si tiene un asunto urgente cuando la clnica est cerrada y que no puede esperar hasta el siguiente da hbil, puede llamar/localizar a su doctor(a) al nmero que aparece a continuacin.   Por favor, tenga en cuenta que aunque hacemos todo lo posible para estar disponibles para asuntos urgentes fuera del horario de [place], no estamos disponibles las 24 horas del da, los 7 das de la [place].   Si tiene un problema urgente y no puede comunicarse con nosotros, puede optar por buscar atencin mdica  en el consultorio de su doctor(a), en una clnica privada, en un centro de atencin urgente o en una sala de emergencias.  Si tiene Engineering geologist, por favor llame inmediatamente al 911 o vaya a la sala de emergencias.  Nmeros de bper  - Dr. Nehemiah Massed: [phone]  - Dra. Moye: [phone]  - Dra. Nicole Kindred: [phone]  En caso de inclemencias del [employer], por favor llame a Johnsie Kindred principal al [phone] para una actualizacin sobre el [place] de cualquier retraso o cierre.  Consejos para la medicacin en dermatologa: Por favor, guarde las cajas en las que vienen los medicamentos de uso tpico para ayudarle a seguir las instrucciones sobre dnde y cmo usarlos. Las farmacias generalmente imprimen las instrucciones del medicamento slo en las cajas y no directamente en los tubos del [place].   Si su medicamento es muy caro, por favor, pngase en contacto con Zigmund Daniel llamando al [phone] y presione la opcin 4 o envenos un mensaje a travs de Pharmacist, community.   No podemos decirle cul ser su copago por los  medicamentos por adelantado ya que esto es diferente dependiendo de la cobertura de su seguro. Sin embargo, es posible que podamos encontrar un medicamento sustituto a Electrical engineer un formulario para que el seguro cubra el medicamento que se considera necesario.   Si se requiere una autorizacin previa para que su compaa de seguros [place] su medicamento, por favor permtanos de 1 a 2 das hbiles para completar este proceso.  Los precios de los medicamentos varan con frecuencia dependiendo del Environmental consultant de dnde se surte la receta y alguna farmacias pueden ofrecer precios ms baratos.  El sitio web www.goodrx.com tiene cupones para medicamentos de Airline pilot. Los precios aqu no tienen en cuenta lo que podra costar con la ayuda del seguro (puede ser ms barato con su seguro), pero el sitio web puede darle el precio si no utiliz Research scientist (physical sciences).  - Puede imprimir el cupn correspondiente y llevarlo con su receta a la farmacia.  -  Tambin puede pasar por nuestra oficina durante el horario de atencin regular y Charity fundraiser una tarjeta de cupones de GoodRx.  - Si necesita que su receta se enve electrnicamente a una farmacia diferente, informe a nuestra oficina a travs de MyChart de [place] o por telfono llamando al [phone] y presione la opcin 4.

## 2022-06-10 NOTE — Progress Notes (Signed)
   Follow-Up Visit   Subjective  Teresa Chambers is a 86 y.o. female who presents for the following: Follow-up (4 week recheck. Blisters and fungus at feet. Used Mupirocin on blisters. Has been using Terbinafine spray. Patient reports feet are much better).    The following portions of the chart were reviewed this encounter and updated as appropriate:  Tobacco  Allergies  Meds  Problems  Med Hx  Surg Hx  Fam Hx      Review of Systems: No other skin or systemic complaints except as noted in HPI or Assessment and Plan.   Objective  Well appearing patient in no apparent distress; mood and affect are within normal limits.  A focused examination was performed including B/L feet. Relevant physical exam findings are noted in the Assessment and Plan.  Left Foot - Anterior Scaling and maceration web spaces and over distal and lateral soles.    Assessment & Plan  Tinea pedis of both feet Left Foot - Anterior  Bullous type  Improved (clear of bullae) but not clear of rash and scale  Continue Lotrimin Spray twice daily as directed.  Start Lamisil cream between toes. Can apply with a Qtip or back cream applicator. Keep feet dry as much as possible.  Continue this for 1 more month then once a week for maintenance.   Use Mupirocin to any open sores   Call for any recurrent bullae.   Return in about 1 year (around 06/11/2023) for TBSE, recheck feet.  I, Emelia Salisbury, CMA, am acting as scribe for Forest Gleason, MD.  Documentation: I have reviewed the above documentation for accuracy and completeness, and I agree with the above.  Forest Gleason, MD

## 2022-06-15 ENCOUNTER — Encounter: Payer: Self-pay | Admitting: Dermatology

## 2022-06-16 ENCOUNTER — Encounter: Payer: Self-pay | Admitting: Nurse Practitioner

## 2022-06-17 ENCOUNTER — Encounter: Payer: Self-pay | Admitting: Internal Medicine

## 2022-06-17 ENCOUNTER — Ambulatory Visit (INDEPENDENT_AMBULATORY_CARE_PROVIDER_SITE_OTHER): Payer: PPO | Admitting: Internal Medicine

## 2022-06-17 VITALS — BP 116/68 | HR 81 | Temp 97.4°F | Ht 66.0 in | Wt 157.9 lb

## 2022-06-17 DIAGNOSIS — G629 Polyneuropathy, unspecified: Secondary | ICD-10-CM | POA: Diagnosis not present

## 2022-06-17 DIAGNOSIS — E063 Autoimmune thyroiditis: Secondary | ICD-10-CM | POA: Diagnosis not present

## 2022-06-17 DIAGNOSIS — J449 Chronic obstructive pulmonary disease, unspecified: Secondary | ICD-10-CM | POA: Diagnosis not present

## 2022-06-17 DIAGNOSIS — I480 Paroxysmal atrial fibrillation: Secondary | ICD-10-CM

## 2022-06-17 DIAGNOSIS — E038 Other specified hypothyroidism: Secondary | ICD-10-CM | POA: Diagnosis not present

## 2022-06-17 DIAGNOSIS — I5082 Biventricular heart failure: Secondary | ICD-10-CM

## 2022-06-17 DIAGNOSIS — I10 Essential (primary) hypertension: Secondary | ICD-10-CM | POA: Diagnosis not present

## 2022-06-17 MED ORDER — APIXABAN 2.5 MG PO TABS: 2.5000 mg | ORAL_TABLET | Freq: Two times a day (BID) | ORAL | 2 refills | Status: DC

## 2022-06-17 MED ORDER — DULOXETINE HCL 30 MG PO CPEP: 30.0000 mg | ORAL_CAPSULE | Freq: Every day | ORAL | 3 refills | Status: DC

## 2022-06-17 NOTE — Assessment & Plan Note (Signed)
Congestive heart failure is under control

## 2022-06-17 NOTE — Assessment & Plan Note (Signed)
No wheezing was noted

## 2022-06-17 NOTE — Progress Notes (Signed)
Established Patient Office Visit  Subjective:  Patient ID: Teresa Chambers, female    DOB: [birth year]  Age: 86 y.o. MRN: [id number]  CC:  Chief Complaint  Patient presents with   Results    Holter monitor results. Needs rx for eliquis if she needs to stay on it.     HPI  Teresa Chambers presents for neuropathy  Past Medical History:  Diagnosis Date   Actinic keratosis    Acute thoracic back pain    Asthma    Basal cell carcinoma    nose    Chest pain    unspecified   CHF (congestive heart failure) ([place]) 04/30/2017   Chronic abdominal pain 05/01/2017   unspecified   Congenital heart failure (HCC)    COPD (chronic obstructive pulmonary disease) (HCC)    Disease of upper respiratory system 04/30/2017   Dyspnea on exertion    Esophageal reflux disease 04/30/2017   History of bone density study 06/28/2004   Leg swelling    Lumbar arthropathy    Nasopharyngitis 04/30/2017   Osteoarthropathy 04/30/2017   Osteoporosis 04/30/2017   Palpitations    Sinusitis, acute 04/30/2017   unspecified   Squamous cell carcinoma of skin 07/28/2017   Right medial knee. Hypertrophic SCCis. Tx: [facility] 07/28/2017    Past Surgical History:  Procedure Laterality Date   CATARACT EXTRACTION     07/21/2001 - 07/20/2002   CHOLECYSTECTOMY     07/21/1997 - 07/20/1998   COLON SURGERY     COLONOSCOPY     05/05/2000, 01/17/2000 Adenomatous Polyps     COLONOSCOPY     11/05/2009, 12/23/2004, 07/07/2001   PH Adenomatous Polyps: CBF 10/2014; recall ltr mailed 09/18/2014 (dw)    ESOPHAGOGASTRODUODENOSCOPY     11/05/2009, 05/05/2000   FLEXIBLE SIGMOIDOSCOPY  11/28/1999   HAND SURGERY  2006   HERNIA REPAIR     HIP ARTHROPLASTY Right 02/11/2018   Procedure: ARTHROPLASTY BIPOLAR HIP (HEMIARTHROPLASTY);  Surgeon: Corky Mull, MD;  Location: [facility] ORS;  Service: Orthopedics;  Laterality: Right;   LAPAROSCOPIC COLON RESECTION     2001    Family History  Problem Relation Age of Onset   Heart disease Mother     Heart disease Father    Hypertension Son    Hypertension Daughter     Social History   Socioeconomic History   Marital status: Divorced    Spouse name: Not on file   Number of children: 5   Years of education: 15.5   Highest education level: High school graduate  Occupational History   Occupation: retired  Tobacco Use   Smoking status: Never   Smokeless tobacco: Never  Scientific laboratory technician Use: Never used  Substance and Sexual Activity   Alcohol use: Not Currently   Drug use: Never   Sexual activity: Not Currently  Other Topics Concern   Not on file  Social History Narrative   Lives independently, has home at [facility] she enjoys traveling to   Social Determinants of Health   Financial Resource Strain: [place]  (06/07/2021)   Overall Financial Resource Strain (CARDIA)    Difficulty of Paying Living Expenses: Not hard at all  Food Insecurity: No Food Insecurity (06/07/2021)   Hunger Vital Sign    Worried About Running Out of Food in the Last Year: Never true    [place] in the Last Year: Never true  Transportation Needs: No Transportation Needs (03/09/2018)   PRAPARE - Transportation  Lack of Transportation (Medical): No    Lack of Transportation (Non-Medical): No  Physical Activity: Inactive (06/07/2021)   Exercise Vital Sign    Days of Exercise per Week: 0 days    Minutes of Exercise per Session: 0 min  Stress: No Stress Concern Present (06/07/2021)   [place]    Feeling of Stress : Only a little  Social Connections: Socially Isolated (06/07/2021)   Social Connection and Isolation Panel [NHANES]    Frequency of Communication with Friends and Family: More than three times a week    Frequency of Social Gatherings with Friends and Family: More than three times a week    Attends Religious Services: Never    Marine scientist or Organizations: No    Attends Archivist  Meetings: Never    Marital Status: Divorced  Human resources officer Violence: Not At Risk (06/07/2021)   Humiliation, Afraid, Rape, and Kick questionnaire    Fear of Current or Ex-Partner: No    Emotionally Abused: No    Physically Abused: No    Sexually Abused: No     Current Outpatient Medications:    acetaminophen (TYLENOL 8 HOUR ARTHRITIS PAIN) 650 MG CR tablet, , Disp: , Rfl:    Ascorbic Acid (VITAMIN C WITH ROSE HIPS) 1000 MG tablet, Take 1,000 mg by mouth daily., Disp: , Rfl:    Calcium Carbonate-Vitamin D 600-400 MG-UNIT tablet, Take 1 tablet by mouth daily., Disp: , Rfl:    DULoxetine (CYMBALTA) 30 MG capsule, Take 1 capsule (30 mg total) by mouth daily., Disp: 90 capsule, Rfl: 3   ENTRESTO 24-26 MG, TAKE 1 TABLET BY MOUTH TWICE DAILY, Disp: 180 tablet, Rfl: 1   ezetimibe (ZETIA) 10 MG tablet, TAKE 1 TABLET(10 MG) BY MOUTH DAILY, Disp: 90 tablet, Rfl: 3   fluticasone (FLONASE) 50 MCG/ACT nasal spray, SHAKE LIQUID AND USE 2 SPRAYS IN EACH NOSTRIL DAILY, Disp: 48 g, Rfl: 6   fluticasone furoate-vilanterol (BREO ELLIPTA) 200-25 MCG/ACT AEPB, Inhale 1 puff into the lungs daily., Disp: 28 each, Rfl: 4   levothyroxine (SYNTHROID) 50 MCG tablet, TAKE 1 TABLET(50 MCG) BY MOUTH DAILY, Disp: 90 tablet, Rfl: 3   mupirocin ointment (BACTROBAN) 2 %, Apply 1 Application topically daily., Disp: 22 g, Rfl: 0   pantoprazole (PROTONIX) 40 MG tablet, TAKE 1 TABLET(40 MG) BY MOUTH DAILY, Disp: 90 tablet, Rfl: 3   pregabalin (LYRICA) 50 MG capsule, TAKE ONE CAPSULE BY MOUTH TWICE DAILY, Disp: 60 capsule, Rfl: 1   tiZANidine (ZANAFLEX) 4 MG tablet, Take by mouth., Disp: , Rfl:    traMADol (ULTRAM) 50 MG tablet, , Disp: , Rfl:    apixaban (ELIQUIS) 2.5 MG TABS tablet, Take 1 tablet (2.5 mg total) by mouth 2 (two) times daily., Disp: 180 tablet, Rfl: 2   Allergies  Allergen Reactions   Codeine Nausea And Vomiting   Lidocaine Swelling and Anaphylaxis    ROS Review of Systems  Constitutional: Negative.    HENT: Negative.    Eyes: Negative.   Respiratory: Negative.    Cardiovascular: Negative.   Gastrointestinal: Negative.   Endocrine: Negative.   Genitourinary: Negative.   Musculoskeletal: Negative.   Skin: Negative.   Allergic/Immunologic: Negative.   Neurological: Negative.   Hematological: Negative.   Psychiatric/Behavioral: Negative.    All other systems reviewed and are negative.     Objective:    Physical Exam Vitals reviewed.  Constitutional:      Appearance: Normal appearance.  HENT:     Mouth/Throat:     Mouth: Mucous membranes are moist.  Eyes:     Pupils: Pupils are equal, round, and reactive to light.  Neck:     Vascular: No carotid bruit.  Cardiovascular:     Rate and Rhythm: Normal rate and regular rhythm.     Pulses: Normal pulses.     Heart sounds: Normal heart sounds.  Pulmonary:     Effort: Pulmonary effort is normal.     Breath sounds: Normal breath sounds.  Abdominal:     General: Bowel sounds are normal.     Palpations: Abdomen is soft. There is no hepatomegaly, splenomegaly or mass.     Tenderness: There is no abdominal tenderness.     Hernia: No hernia is present.  Musculoskeletal:        General: No tenderness.     Cervical back: Neck supple.     Right lower leg: No edema.     Left lower leg: No edema.  Skin:    Findings: No rash.  Neurological:     Mental Status: She is alert and oriented to person, place, and time.     Motor: No weakness.  Psychiatric:        Mood and Affect: Mood and affect normal.        Behavior: Behavior normal.     BP 116/68   Pulse 81   Temp (!) 97.4 F (36.3 C) (Temporal)   Ht _0  (1.676 m)   Wt 157 lb 14.4 oz (71.6 kg)   SpO2 96%   BMI 25.49 kg/m  Wt Readings from Last 3 Encounters:  06/17/22 157 lb 14.4 oz (71.6 kg)  05/02/22 160 lb 1.6 oz (72.6 kg)  04/30/22 160 lb (72.6 kg)     Health Maintenance Due  Topic Date Due   Medicare Annual Wellness (AWV)  06/07/2022    There are no  preventive care reminders to display for this patient.  Lab Results  Component Value Date   TSH 1.76 03/31/2022   Lab Results  Component Value Date   WBC 8.9 03/31/2022   HGB 11.7 03/31/2022   HCT 33.9 (L) 03/31/2022   MCV 99.1 03/31/2022   PLT 386 03/31/2022   Lab Results  Component Value Date   NA 145 03/31/2022   K 3.6 03/31/2022   CO2 27 03/31/2022   GLUCOSE 114 (H) 03/31/2022   BUN 16 03/31/2022   CREATININE 0.80 03/31/2022   BILITOT 0.4 03/31/2022   ALKPHOS 48 03/29/2019   AST 13 03/31/2022   ALT 14 03/31/2022   PROT 5.6 (L) 03/31/2022   ALBUMIN 4.0 03/29/2019   CALCIUM 8.6 03/31/2022   ANIONGAP 9 03/29/2019   EGFR 73 03/31/2022   Lab Results  Component Value Date   CHOL 196 06/04/2021   Lab Results  Component Value Date   HDL 70 06/04/2021   Lab Results  Component Value Date   LDLCALC 107 (H) 06/04/2021   Lab Results  Component Value Date   TRIG 97 06/04/2021   Lab Results  Component Value Date   CHOLHDL 2.8 06/04/2021   No results found for: "HGBA1C"    Assessment & Plan:   Problem List Items Addressed This Visit       Cardiovascular and Mediastinum   CHF (congestive heart failure) (HCC)    Congestive heart failure is under control      Relevant Medications   apixaban (ELIQUIS) 2.5 MG TABS tablet   Essential  hypertension - Primary    Blood pressure is stable      Relevant Medications   apixaban (ELIQUIS) 2.5 MG TABS tablet     Respiratory   Chronic obstructive pulmonary disease (HCC)    No wheezing was noted        Endocrine   Hypothyroidism due to Hashimoto's thyroiditis   Other Visit Diagnoses     Neuropathy       Relevant Medications   DULoxetine (CYMBALTA) 30 MG capsule   Paroxysmal atrial fibrillation (HCC)       Relevant Medications   apixaban (ELIQUIS) 2.5 MG TABS tablet       Meds ordered this encounter  Medications   DULoxetine (CYMBALTA) 30 MG capsule    Sig: Take 1 capsule (30 mg total) by mouth  daily.    Dispense:  90 capsule    Refill:  3   apixaban (ELIQUIS) 2.5 MG TABS tablet    Sig: Take 1 tablet (2.5 mg total) by mouth 2 (two) times daily.    Dispense:  180 tablet    Refill:  2    Follow-up: No follow-ups on file.  Report of the 24-hour Holter monitor. Average heart rate 76 lowest heart rate 35 maximum heart rate 111.  Patient was found to have a sinus bradycardia she has a history of atrial fibrillation in the past longest heart rate was 1 49-1 58.  Some isolated supraventricular beats were noted   Some pauses noted 1 of 2 of them were more than 2.5 seconds.  Will monitor the patient clinically  Cletis Athens, MD

## 2022-06-17 NOTE — Assessment & Plan Note (Signed)
Blood pressure is stable 

## 2022-06-20 ENCOUNTER — Other Ambulatory Visit: Payer: Self-pay | Admitting: Internal Medicine

## 2022-06-20 DIAGNOSIS — E038 Other specified hypothyroidism: Secondary | ICD-10-CM

## 2022-07-16 DIAGNOSIS — M5416 Radiculopathy, lumbar region: Secondary | ICD-10-CM | POA: Diagnosis not present

## 2022-07-16 DIAGNOSIS — M5136 Other intervertebral disc degeneration, lumbar region: Secondary | ICD-10-CM | POA: Diagnosis not present

## 2022-07-16 DIAGNOSIS — M48062 Spinal stenosis, lumbar region with neurogenic claudication: Secondary | ICD-10-CM | POA: Diagnosis not present

## 2022-07-22 ENCOUNTER — Telehealth: Payer: Self-pay

## 2022-07-22 DIAGNOSIS — M5416 Radiculopathy, lumbar region: Secondary | ICD-10-CM | POA: Diagnosis not present

## 2022-07-22 DIAGNOSIS — M48062 Spinal stenosis, lumbar region with neurogenic claudication: Secondary | ICD-10-CM | POA: Diagnosis not present

## 2022-07-22 MED ORDER — PREGABALIN 50 MG PO CAPS: 50.0000 mg | ORAL_CAPSULE | Freq: Two times a day (BID) | ORAL | 0 refills | Status: DC

## 2022-07-22 NOTE — Telephone Encounter (Signed)
MEDICATION:pregabalin (LYRICA) 50 MG capsule   PHARMACY:[facility]: Address: [address] [place], [place] [address]  Comments:   **Let patient know to contact pharmacy at the end of the day to make sure medication is ready. **  ** Please notify patient to allow 48-72 hours to process**  **Encourage patient to contact the pharmacy for refills or they can request refills through [facility]**

## 2022-07-29 ENCOUNTER — Other Ambulatory Visit (INDEPENDENT_AMBULATORY_CARE_PROVIDER_SITE_OTHER): Payer: Self-pay | Admitting: Nurse Practitioner

## 2022-07-29 ENCOUNTER — Telehealth (INDEPENDENT_AMBULATORY_CARE_PROVIDER_SITE_OTHER): Payer: Self-pay | Admitting: Vascular Surgery

## 2022-07-29 DIAGNOSIS — I5022 Chronic systolic (congestive) heart failure: Secondary | ICD-10-CM

## 2022-07-29 NOTE — Telephone Encounter (Signed)
Referral sent 

## 2022-07-29 NOTE — Telephone Encounter (Signed)
Patient made aware.

## 2022-07-29 NOTE — Telephone Encounter (Signed)
Patient called wanting a referral for Cardio. Her cardiologist is leaving the system and she wants to start seeing this provider   [place], MD [address] [place], [place] [address] [phone] FAX : [phone]  Please call and advise when done and faxed

## 2022-08-04 ENCOUNTER — Ambulatory Visit: Payer: PPO | Admitting: Dermatology

## 2022-08-04 VITALS — BP 117/60 | HR 105

## 2022-08-04 DIAGNOSIS — L814 Other melanin hyperpigmentation: Secondary | ICD-10-CM | POA: Diagnosis not present

## 2022-08-04 DIAGNOSIS — Z79899 Other long term (current) drug therapy: Secondary | ICD-10-CM

## 2022-08-04 DIAGNOSIS — L821 Other seborrheic keratosis: Secondary | ICD-10-CM

## 2022-08-04 DIAGNOSIS — L719 Rosacea, unspecified: Secondary | ICD-10-CM | POA: Diagnosis not present

## 2022-08-04 DIAGNOSIS — B353 Tinea pedis: Secondary | ICD-10-CM | POA: Diagnosis not present

## 2022-08-04 DIAGNOSIS — L853 Xerosis cutis: Secondary | ICD-10-CM

## 2022-08-04 DIAGNOSIS — L578 Other skin changes due to chronic exposure to nonionizing radiation: Secondary | ICD-10-CM

## 2022-08-04 DIAGNOSIS — D229 Melanocytic nevi, unspecified: Secondary | ICD-10-CM | POA: Diagnosis not present

## 2022-08-04 DIAGNOSIS — Z1283 Encounter for screening for malignant neoplasm of skin: Secondary | ICD-10-CM

## 2022-08-04 DIAGNOSIS — Z85828 Personal history of other malignant neoplasm of skin: Secondary | ICD-10-CM | POA: Diagnosis not present

## 2022-08-04 DIAGNOSIS — D692 Other nonthrombocytopenic purpura: Secondary | ICD-10-CM | POA: Diagnosis not present

## 2022-08-04 DIAGNOSIS — Z8589 Personal history of malignant neoplasm of other organs and systems: Secondary | ICD-10-CM

## 2022-08-04 NOTE — Progress Notes (Signed)
Follow-Up Visit   Subjective  Teresa Chambers is a 87 y.o. female who presents for the following: Annual Exam (Hx BCC, SCC - hx tinea pedis, currently using Mupirocin 2% ointment to open sores and Lamisil cream to feet). The patient presents for Total-Body Skin Exam (TBSE) for skin cancer screening and mole check.  The patient has spots, moles and lesions to be evaluated, some may be new or changing and the patient has concerns that these could be cancer.  The following portions of the chart were reviewed this encounter and updated as appropriate:   Tobacco  Allergies  Meds  Problems  Med Hx  Surg Hx  Fam Hx     Review of Systems:  No other skin or systemic complaints except as noted in HPI or Assessment and Plan.  Objective  Well appearing patient in no apparent distress; mood and affect are within normal limits.  A full examination was performed including scalp, head, eyes, ears, nose, lips, neck, chest, axillae, abdomen, back, buttocks, bilateral upper extremities, bilateral lower extremities, hands, feet, fingers, toes, fingernails, and toenails. All findings within normal limits unless otherwise noted below.  Face Mid face erythema with telangiectasias +/- scattered inflammatory papules. Crusted papule on the nose.  B/L foot Scaling and maceration web spaces and over distal and lateral soles.    Assessment & Plan  Rosacea Face Rosacea is a chronic progressive skin condition usually affecting the face of adults, causing redness and/or acne bumps. It is treatable but not curable. It sometimes affects the eyes (ocular rosacea) as well. It may respond to topical and/or systemic medication and can flare with stress, sun exposure, alcohol, exercise, topical steroids (including hydrocortisone/cortisone 10) and some foods.  Daily application of broad spectrum spf 30+ sunscreen to face is recommended to reduce flares.  If crusted papule on the nose not resolved in 6 weeks RTC. Ok to  use Mupirocin 2% ointment while healing.  Tinea pedis of both feet B/L foot Bullous type -  Chronic and persistent condition with duration or expected duration over one year. Condition is symptomatic / bothersome to patient. Not to goal. Continue Mupirocin 2% ointment to any open sores QD.  Continue Lotrimin cream or spray QD.  If persistent problem, consider oral antifungal treatment.  Lentigines - Scattered tan macules - Due to sun exposure - Benign-appearing, observe - Recommend daily broad spectrum sunscreen SPF 30+ to sun-exposed areas, reapply every 2 hours as needed. - Call for any changes  Seborrheic Keratoses - Stuck-on, waxy, tan-brown papules and/or plaques  - Benign-appearing - Discussed benign etiology and prognosis. - Observe - Call for any changes  Melanocytic Nevi - Tan-brown and/or pink-flesh-colored symmetric macules and papules - Benign appearing on exam today - Observation - Call clinic for new or changing moles - Recommend daily use of broad spectrum spf 30+ sunscreen to sun-exposed areas.   Hemangiomas - Red papules - Discussed benign nature - Observe - Call for any changes  Actinic Damage - Chronic condition, secondary to cumulative UV/sun exposure - diffuse scaly erythematous macules with underlying dyspigmentation - Recommend daily broad spectrum sunscreen SPF 30+ to sun-exposed areas, reapply every 2 hours as needed.  - Staying in the shade or wearing long sleeves, sun glasses (UVA+UVB protection) and wide brim hats (4-inch brim around the entire circumference of the hat) are also recommended for sun protection.  - Call for new or changing lesions.  History of Basal Cell Carcinoma of the Skin - No evidence of  recurrence today - Recommend regular full body skin exams - Recommend daily broad spectrum sunscreen SPF 30+ to sun-exposed areas, reapply every 2 hours as needed.  - Call if any new or changing lesions are noted between office  visits  History of Squamous Cell Carcinoma of the Skin - No evidence of recurrence today - No lymphadenopathy - Recommend regular full body skin exams - Recommend daily broad spectrum sunscreen SPF 30+ to sun-exposed areas, reapply every 2 hours as needed.  - Call if any new or changing lesions are noted between office visits  Purpura - Chronic; persistent and recurrent.  Treatable, but not curable. - Violaceous macules and patches - Benign - Related to trauma, age, sun damage and/or use of blood thinners, chronic use of topical and/or oral steroids - Observe - Can use OTC arnica containing moisturizer such as Dermend Bruise Formula if desired - Call for worsening or other concerns  Xerosis - diffuse xerotic patches - recommend gentle, hydrating skin care - gentle skin care handout given  Skin cancer screening performed today.  Return in about 1 year (around 08/05/2023) for TBSE.  Luther Redo, CMA, am acting as scribe for Sarina Ser, MD . Documentation: I have reviewed the above documentation for accuracy and completeness, and I agree with the above.  Sarina Ser, MD

## 2022-08-04 NOTE — Patient Instructions (Addendum)
Gentle Skin Care Guide  1. Bathe no more than once a day.  2. Avoid bathing in hot water  3. Use a mild soap like Dove, Vanicream, Cetaphil, CeraVe. Can use Lever 2000 or Cetaphil antibacterial soap  4. Use soap only where you need it. On most days, use it under your arms, between your legs, and on your feet. Let the water rinse other areas unless visibly dirty.  5. When you get out of the bath/shower, use a towel to gently blot your skin dry, don't rub it.  6. While your skin is still a little damp, apply a moisturizing cream such as Vanicream, CeraVe, Cetaphil, Eucerin, Sarna lotion or plain Vaseline Jelly. For hands apply Neutrogena Norwegian Hand Cream or Excipial Hand Cream.  7. Reapply moisturizer any time you start to itch or feel dry.  8. Sometimes using free and clear laundry detergents can be helpful. Fabric softener sheets should be avoided. Downy Free & Gentle liquid, or any liquid fabric softener that is free of dyes and perfumes, it acceptable to use  9. If your doctor has given you prescription creams you may apply moisturizers over them      Due to recent changes in healthcare laws, you may see results of your pathology and/or laboratory studies on MyChart before the doctors have had a chance to review them. We understand that in some cases there may be results that are confusing or concerning to you. Please understand that not all results are received at the same time and often the doctors may need to interpret multiple results in order to provide you with the best plan of care or course of treatment. Therefore, we ask that you please give us 2 business days to thoroughly review all your results before contacting the office for clarification. Should we see a critical lab result, you will be contacted sooner.   If You Need Anything After Your Visit  If you have any questions or concerns for your doctor, please call our main line at [phone] and press option 4 to reach  your doctor's medical assistant. If no one answers, please leave a voicemail as directed and we will return your call as soon as possible. Messages left after 4 pm will be answered the following business day.   You may also send us a message via MyChart. We typically respond to MyChart messages within 1-2 business days.  For prescription refills, please ask your pharmacy to contact our office. Our fax number is [phone].  If you have an urgent issue when the clinic is closed that cannot wait until the next business day, you can page your doctor at the number below.    Please note that while we do our best to be available for urgent issues outside of office hours, we are not available 24/7.   If you have an urgent issue and are unable to reach us, you may choose to seek medical care at your doctor's office, retail clinic, urgent care center, or emergency room.  If you have a medical emergency, please immediately call 911 or go to the emergency department.  Pager Numbers  - Dr. Kowalski: [phone]  - Dr. Moye: [phone]  - Dr. Stewart: [phone]  In the event of inclement weather, please call our main line at [phone] for an update on the status of any delays or closures.  Dermatology Medication Tips: Please keep the boxes that topical medications come in in order to help keep track of the instructions about   where and how to use these. Pharmacies typically print the medication instructions only on the boxes and not directly on the medication tubes.   If your medication is too expensive, please contact our office at [phone] option 4 or send us a message through MyChart.   We are unable to tell what your co-pay for medications will be in advance as this is different depending on your insurance coverage. However, we may be able to find a substitute medication at lower cost or fill out paperwork to get insurance to cover a needed medication.   If a prior authorization  is required to get your medication covered by your insurance company, please allow us 1-2 business days to complete this process.  Drug prices often vary depending on where the prescription is filled and some pharmacies may offer cheaper prices.  The website www.goodrx.com contains coupons for medications through different pharmacies. The prices here do not account for what the cost may be with help from insurance (it may be cheaper with your insurance), but the website can give you the price if you did not use any insurance.  - You can print the associated coupon and take it with your prescription to the pharmacy.  - You may also stop by our office during regular business hours and pick up a GoodRx coupon card.  - If you need your prescription sent electronically to a different pharmacy, notify our office through [facility] MyChart or by phone at [phone] option 4.     Si Usted Necesita Algo Despus de Su Visita  Tambin puede enviarnos un mensaje a travs de MyChart. Por lo general respondemos a los mensajes de MyChart en el transcurso de 1 a 2 das hbiles.  Para renovar recetas, por favor pida a su farmacia que se ponga en contacto con nuestra oficina. Nuestro nmero de fax es el [phone].  Si tiene un asunto urgente cuando la clnica est cerrada y que no puede esperar hasta el siguiente da hbil, puede llamar/localizar a su doctor(a) al nmero que aparece a continuacin.   Por favor, tenga en cuenta que aunque hacemos todo lo posible para estar disponibles para asuntos urgentes fuera del horario de oficina, no estamos disponibles las 24 horas del da, los 7 das de la semana.   Si tiene un problema urgente y no puede comunicarse con nosotros, puede optar por buscar atencin mdica  en el consultorio de su doctor(a), en una clnica privada, en un centro de atencin urgente o en una sala de emergencias.  Si tiene una emergencia mdica, por favor llame inmediatamente al 911 o  vaya a la sala de emergencias.  Nmeros de bper  - Dr. Kowalski: [phone]  - Dra. Moye: [phone]  - Dra. Stewart: [phone]  En caso de inclemencias del tiempo, por favor llame a nuestra lnea principal al [phone] para una actualizacin sobre el estado de cualquier retraso o cierre.  Consejos para la medicacin en dermatologa: Por favor, guarde las cajas en las que vienen los medicamentos de uso tpico para ayudarle a seguir las instrucciones sobre dnde y cmo usarlos. Las farmacias generalmente imprimen las instrucciones del medicamento slo en las cajas y no directamente en los tubos del medicamento.   Si su medicamento es muy caro, por favor, pngase en contacto con nuestra oficina llamando al [phone] y presione la opcin 4 o envenos un mensaje a travs de MyChart.   No podemos decirle cul ser su copago por los medicamentos por adelantado ya que esto   es diferente dependiendo de la cobertura de su seguro. Sin embargo, es posible que podamos encontrar un medicamento sustituto a menor costo o llenar un formulario para que el seguro cubra el medicamento que se considera necesario.   Si se requiere una autorizacin previa para que su compaa de seguros cubra su medicamento, por favor permtanos de 1 a 2 das hbiles para completar este proceso.  Los precios de los medicamentos varan con frecuencia dependiendo del lugar de dnde se surte la receta y alguna farmacias pueden ofrecer precios ms baratos.  El sitio web www.goodrx.com tiene cupones para medicamentos de diferentes farmacias. Los precios aqu no tienen en cuenta lo que podra costar con la ayuda del seguro (puede ser ms barato con su seguro), pero el sitio web puede darle el precio si no utiliz ningn seguro.  - Puede imprimir el cupn correspondiente y llevarlo con su receta a la farmacia.  - Tambin puede pasar por nuestra oficina durante el horario de atencin regular y recoger una tarjeta de cupones  de GoodRx.  - Si necesita que su receta se enve electrnicamente a una farmacia diferente, informe a nuestra oficina a travs de MyChart de [place] o por telfono llamando al [phone] y presione la opcin 4.  

## 2022-08-05 ENCOUNTER — Ambulatory Visit (INDEPENDENT_AMBULATORY_CARE_PROVIDER_SITE_OTHER): Payer: PPO | Admitting: Vascular Surgery

## 2022-08-05 ENCOUNTER — Ambulatory Visit: Payer: PPO | Admitting: Internal Medicine

## 2022-08-05 ENCOUNTER — Encounter (INDEPENDENT_AMBULATORY_CARE_PROVIDER_SITE_OTHER): Payer: Self-pay | Admitting: Vascular Surgery

## 2022-08-05 VITALS — BP 128/76 | HR 78

## 2022-08-05 DIAGNOSIS — I10 Essential (primary) hypertension: Secondary | ICD-10-CM | POA: Diagnosis not present

## 2022-08-05 DIAGNOSIS — I5082 Biventricular heart failure: Secondary | ICD-10-CM | POA: Diagnosis not present

## 2022-08-05 DIAGNOSIS — I890 Lymphedema, not elsewhere classified: Secondary | ICD-10-CM | POA: Diagnosis not present

## 2022-08-05 DIAGNOSIS — I872 Venous insufficiency (chronic) (peripheral): Secondary | ICD-10-CM

## 2022-08-05 NOTE — Assessment & Plan Note (Signed)
Swelling is better

## 2022-08-05 NOTE — Assessment & Plan Note (Signed)
We discussed the role for laser ablation for her saphenous vein reflux bilaterally.  If her legs do not improve with improved cardiac function after seeing a cardiologist and most significantly intensive physical therapy and strengthening, I think would be reasonable to perform laser ablation of the great saphenous veins to try to improve her symptoms.  Were going to pursue these other issues first, and we will check her back in 2 to 3 months to discuss whether or not laser ablation would be of benefit.

## 2022-08-05 NOTE — Progress Notes (Signed)
MRN : [id number]  Teresa Chambers is a 87 y.o. (Apr 09, 1936) female who presents with chief complaint of  Chief Complaint  Patient presents with   Follow-up    3 month follow up  .  History of Present Illness: Patient returns today in follow up of her leg pain and swelling.  She has several other issues going on and is scheduled to see a cardiologist later this month.  She also has arranged for physical therapy to try to improve the function and strength in her legs.  Her swelling is much better, but her legs still hurt her and her week.  She does have great saphenous vein reflux on both sides.  She continues to wear her compression socks and elevate her legs.  Current Outpatient Medications  Medication Sig Dispense Refill   acetaminophen (TYLENOL 8 HOUR ARTHRITIS PAIN) 650 MG CR tablet      apixaban (ELIQUIS) 2.5 MG TABS tablet Take 1 tablet (2.5 mg total) by mouth 2 (two) times daily. 180 tablet 2   Ascorbic Acid (VITAMIN C WITH ROSE HIPS) 1000 MG tablet Take 1,000 mg by mouth daily.     Calcium Carbonate-Vitamin D 600-400 MG-UNIT tablet Take 1 tablet by mouth daily.     DULoxetine (CYMBALTA) 30 MG capsule Take 1 capsule (30 mg total) by mouth daily. 90 capsule 3   ENTRESTO 24-26 MG TAKE 1 TABLET BY MOUTH TWICE DAILY 180 tablet 1   ezetimibe (ZETIA) 10 MG tablet TAKE 1 TABLET(10 MG) BY MOUTH DAILY 90 tablet 3   fluticasone (FLONASE) 50 MCG/ACT nasal spray SHAKE LIQUID AND USE 2 SPRAYS IN EACH NOSTRIL DAILY 48 g 6   fluticasone furoate-vilanterol (BREO ELLIPTA) 200-25 MCG/ACT AEPB Inhale 1 puff into the lungs daily. 28 each 4   levothyroxine (SYNTHROID) 50 MCG tablet TAKE 1 TABLET(50 MCG) BY MOUTH DAILY 90 tablet 3   mupirocin ointment (BACTROBAN) 2 % Apply 1 Application topically daily. 22 g 0   pantoprazole (PROTONIX) 40 MG tablet TAKE 1 TABLET(40 MG) BY MOUTH DAILY 90 tablet 3   pregabalin (LYRICA) 50 MG capsule Take 1 capsule (50 mg total) by mouth 2 (two) times daily. 180 capsule 0    tiZANidine (ZANAFLEX) 4 MG tablet Take by mouth.     traMADol (ULTRAM) 50 MG tablet      No current facility-administered medications for this visit.    Past Medical History:  Diagnosis Date   Actinic keratosis    Acute thoracic back pain    Asthma    Basal cell carcinoma    nose    Chest pain    unspecified   CHF (congestive heart failure) ([place]) 04/30/2017   Chronic abdominal pain 05/01/2017   unspecified   Congenital heart failure (HCC)    COPD (chronic obstructive pulmonary disease) (HCC)    Disease of upper respiratory system 04/30/2017   Dyspnea on exertion    Esophageal reflux disease 04/30/2017   History of bone density study 06/28/2004   Leg swelling    Lumbar arthropathy    Nasopharyngitis 04/30/2017   Osteoarthropathy 04/30/2017   Osteoporosis 04/30/2017   Palpitations    Sinusitis, acute 04/30/2017   unspecified   Squamous cell carcinoma of skin 07/28/2017   Right medial knee. Hypertrophic SCCis. Tx: [facility] 07/28/2017    Past Surgical History:  Procedure Laterality Date   CATARACT EXTRACTION     07/21/2001 - 07/20/2002   CHOLECYSTECTOMY     07/21/1997 - 07/20/1998   COLON SURGERY  COLONOSCOPY     05/05/2000, 01/17/2000 Adenomatous Polyps     COLONOSCOPY     11/05/2009, 12/23/2004, 07/07/2001   PH Adenomatous Polyps: CBF 10/2014; recall ltr mailed 09/18/2014 (dw)    ESOPHAGOGASTRODUODENOSCOPY     11/05/2009, 05/05/2000   FLEXIBLE SIGMOIDOSCOPY  11/28/1999   HAND SURGERY  2006   HERNIA REPAIR     HIP ARTHROPLASTY Right 02/11/2018   Procedure: ARTHROPLASTY BIPOLAR HIP (HEMIARTHROPLASTY);  Surgeon: Corky Mull, MD;  Location: [facility] ORS;  Service: Orthopedics;  Laterality: Right;   LAPAROSCOPIC COLON RESECTION     2001     Social History   Tobacco Use   Smoking status: Never   Smokeless tobacco: Never  Vaping Use   Vaping Use: Never used  Substance Use Topics   Alcohol use: Not Currently   Drug use: Never      Family History  Problem Relation Age  of Onset   Heart disease Mother    Heart disease Father    Hypertension Son    Hypertension Daughter     Allergies  Allergen Reactions   Codeine Nausea And Vomiting   Lidocaine Swelling and Anaphylaxis    REVIEW OF SYSTEMS (Negative unless checked) Constitutional: '[]'$ Weight loss  '[]'$ Fever  '[]'$ Chills Cardiac: '[]'$ Chest pain   '[]'$ Chest pressure   '[x]'$ Palpitations   '[]'$ Shortness of breath when laying flat   '[]'$ Shortness of breath at rest   '[]'$ Shortness of breath with exertion. Vascular:  '[x]'$ Pain in legs with walking   '[x]'$ Pain in legs at rest   '[]'$ Pain in legs when laying flat   '[]'$ Claudication   '[]'$ Pain in feet when walking  '[]'$ Pain in feet at rest  '[]'$ Pain in feet when laying flat   '[]'$ History of DVT   '[]'$ Phlebitis   '[x]'$ Swelling in legs   '[]'$ Varicose veins   '[]'$ Non-healing ulcers Pulmonary:   '[]'$ Uses home oxygen   '[]'$ Productive cough   '[]'$ Hemoptysis   '[]'$ Wheeze  '[x]'$ COPD   '[x]'$ Asthma Neurologic:  '[]'$ Dizziness  '[]'$ Blackouts   '[]'$ Seizures   '[]'$ History of stroke   '[]'$ History of TIA  '[]'$ Aphasia   '[]'$ Temporary blindness   '[]'$ Dysphagia   '[]'$ Weakness or numbness in arms   '[]'$ Weakness or numbness in legs Musculoskeletal:  '[]'$ Arthritis   '[]'$ Joint swelling   '[]'$ Joint pain   '[]'$ Low back pain Hematologic:  '[x]'$ Easy bruising  '[]'$ Easy bleeding   '[]'$ Hypercoagulable state   '[]'$ Anemic  '[]'$ Hepatitis Gastrointestinal:  '[]'$ Blood in stool   '[]'$ Vomiting blood  '[]'$ Gastroesophageal reflux/heartburn   '[]'$ Abdominal pain Genitourinary:  '[]'$ Chronic kidney disease   '[]'$ Difficult urination  '[]'$ Frequent urination  '[]'$ Burning with urination   '[]'$ Hematuria Skin:  '[]'$ Rashes   '[]'$ Ulcers   '[]'$ Wounds Psychological:  '[]'$ History of anxiety   '[]'$  History of major depression  Physical Examination  BP 128/76 (BP Location: Left Arm)   Pulse 78  Gen:  WD/WN, NAD. Appears younger than stated age. Head: [place]/AT, No temporalis wasting. Ear/Nose/Throat: Hearing grossly intact, nares w/o erythema or drainage Eyes: Conjunctiva clear. Sclera non-icteric Neck: Supple.  Trachea  midline Pulmonary:  Good air movement, no use of accessory muscles.  Cardiac: RRR, no JVD Vascular:  Vessel Right Left  Radial Palpable Palpable               Musculoskeletal: M/S 5/5 throughout.  No deformity or atrophy.  Purplish discoloration is present in both feet and ankles.  Scattered varicosities bilaterally.  No significant lower extremity edema. Neurologic: Sensation grossly intact in extremities.  Symmetrical.  Speech is fluent.  Psychiatric: Judgment intact, Mood & affect appropriate for  pt's clinical situation. Dermatologic: No rashes or ulcers noted.  No cellulitis or open wounds.     Labs No results found for this or any previous visit (from the past 2160 hour(s)).  Radiology No results found.  Assessment/Plan  Congestive heart failure (HCC) Poor cardiac function certainly worsens and exacerbates lower extremity swelling type symptoms.   Essential hypertension blood pressure control important in reducing the progression of atherosclerotic disease. On appropriate oral medications.  Chronic venous insufficiency We discussed the role for laser ablation for her saphenous vein reflux bilaterally.  If her legs do not improve with improved cardiac function after seeing a cardiologist and most significantly intensive physical therapy and strengthening, I think would be reasonable to perform laser ablation of the great saphenous veins to try to improve her symptoms.  Were going to pursue these other issues first, and we will check her back in 2 to 3 months to discuss whether or not laser ablation would be of benefit.  Lymphedema Swelling is better    Leotis Pain, MD  08/05/2022 4:02 PM    This note was created with Dragon medical transcription system.  Any errors from dictation are purely unintentional

## 2022-08-06 ENCOUNTER — Encounter: Payer: Self-pay | Admitting: Dermatology

## 2022-08-08 DIAGNOSIS — M5442 Lumbago with sciatica, left side: Secondary | ICD-10-CM | POA: Diagnosis not present

## 2022-08-08 DIAGNOSIS — M5441 Lumbago with sciatica, right side: Secondary | ICD-10-CM | POA: Diagnosis not present

## 2022-08-08 DIAGNOSIS — G8929 Other chronic pain: Secondary | ICD-10-CM | POA: Diagnosis not present

## 2022-08-20 DIAGNOSIS — G8929 Other chronic pain: Secondary | ICD-10-CM | POA: Diagnosis not present

## 2022-08-20 DIAGNOSIS — M5442 Lumbago with sciatica, left side: Secondary | ICD-10-CM | POA: Diagnosis not present

## 2022-08-20 DIAGNOSIS — M5441 Lumbago with sciatica, right side: Secondary | ICD-10-CM | POA: Diagnosis not present

## 2022-08-22 DIAGNOSIS — S42201D Unspecified fracture of upper end of right humerus, subsequent encounter for fracture with routine healing: Secondary | ICD-10-CM | POA: Diagnosis not present

## 2022-08-22 DIAGNOSIS — M19011 Primary osteoarthritis, right shoulder: Secondary | ICD-10-CM | POA: Diagnosis not present

## 2022-08-22 DIAGNOSIS — M7061 Trochanteric bursitis, right hip: Secondary | ICD-10-CM | POA: Diagnosis not present

## 2022-08-27 DIAGNOSIS — M5441 Lumbago with sciatica, right side: Secondary | ICD-10-CM | POA: Diagnosis not present

## 2022-08-27 DIAGNOSIS — G8929 Other chronic pain: Secondary | ICD-10-CM | POA: Diagnosis not present

## 2022-08-27 DIAGNOSIS — M5442 Lumbago with sciatica, left side: Secondary | ICD-10-CM | POA: Diagnosis not present

## 2022-09-15 NOTE — Progress Notes (Unsigned)
Cardiology Office Note  Date:  09/16/2022   ID:  Teresa Chambers, DOB [birth year], MRN [name]  PCP:  Cletis Athens, MD   Chief Complaint  Patient presents with   New Patient (Initial Visit)    Ref by Dr. Lucky Cowboy to establish care for CHF. Patient has palpitations and shortness of breath at times. Medications reviewed by the patient verbally.     HPI:  Teresa Chambers is a 87 year old woman with past medical history of Edema Hypertension COPD/asthma Hyperlipidemia Records indicating chronic systolic CHF but no details available Who presents by referral from Eulogio Ditch for chronic systolic CHF, palpitations  Notes from Dr. Lavera Guise reviewed Details of her prior echocardiograms unavailable Lab work has been requested  Denies significant chest pain or shortness of breath Denies prior history of ischemic workup  Holter monitor done through Dr. Lavera Guise following which she was started on Eliquis 2-day Holter reviewed by myself, no clear indication of atrial fibrillation or flutter noted Reports she continues to have Paroxysmal tachycardia, rate up to 180 at home Less than 5 min Sometimes >20 min  "Told I had two heart attacks" Fell July 2019 hospitalized with PNA,  Reports feet are chronically cold Normal ABIs with vascular October 2023 Minimal leg swelling  Holter: NSR, PVCs  Feet numb, on lyrica  EKG personally reviewed by myself on todays visit Normal sinus rhythm rate 81 bpm no significant ST-T wave changes, left axis deviation  PMH:   has a past medical history of Actinic keratosis, Acute thoracic back pain, Asthma, Basal cell carcinoma, Chest pain, CHF (congestive heart failure) ([place]) (04/30/2017), Chronic abdominal pain (05/01/2017), Congenital heart failure ([place]), COPD (chronic obstructive pulmonary disease) ([name]), Disease of upper respiratory system (04/30/2017), Dyspnea on exertion, Esophageal reflux disease (04/30/2017), History of bone density study (06/28/2004), Leg  swelling, Lumbar arthropathy, Nasopharyngitis (04/30/2017), Osteoarthropathy (04/30/2017), Osteoporosis (04/30/2017), Palpitations, Sinusitis, acute (04/30/2017), and Squamous cell carcinoma of skin (07/28/2017).  PSH:    Past Surgical History:  Procedure Laterality Date   CATARACT EXTRACTION     07/21/2001 - 07/20/2002   CHOLECYSTECTOMY     07/21/1997 - 07/20/1998   COLON SURGERY     COLONOSCOPY     05/05/2000, 01/17/2000 Adenomatous Polyps     COLONOSCOPY     11/05/2009, 12/23/2004, 07/07/2001   PH Adenomatous Polyps: CBF 10/2014; recall ltr mailed 09/18/2014 (dw)    ESOPHAGOGASTRODUODENOSCOPY     11/05/2009, 05/05/2000   FLEXIBLE SIGMOIDOSCOPY  11/28/1999   HAND SURGERY  2006   HERNIA REPAIR     HIP ARTHROPLASTY Right 02/11/2018   Procedure: ARTHROPLASTY BIPOLAR HIP (HEMIARTHROPLASTY);  Surgeon: Corky Mull, MD;  Location: [facility] ORS;  Service: Orthopedics;  Laterality: Right;   LAPAROSCOPIC COLON RESECTION     2001    Current Outpatient Medications  Medication Sig Dispense Refill   acetaminophen (TYLENOL 8 HOUR ARTHRITIS PAIN) 650 MG CR tablet      apixaban (ELIQUIS) 2.5 MG TABS tablet Take 1 tablet (2.5 mg total) by mouth 2 (two) times daily. 180 tablet 2   Ascorbic Acid (VITAMIN C WITH ROSE HIPS) 1000 MG tablet Take 1,000 mg by mouth daily.     Calcium Carbonate-Vitamin D 600-400 MG-UNIT tablet Take 1 tablet by mouth daily.     DULoxetine (CYMBALTA) 30 MG capsule Take 1 capsule (30 mg total) by mouth daily. 90 capsule 3   ENTRESTO 24-26 MG TAKE 1 TABLET BY MOUTH TWICE DAILY 180 tablet 1   ezetimibe (ZETIA) 10 MG tablet TAKE 1  TABLET(10 MG) BY MOUTH DAILY 90 tablet 3   fluticasone (FLONASE) 50 MCG/ACT nasal spray SHAKE LIQUID AND USE 2 SPRAYS IN EACH NOSTRIL DAILY 48 g 6   fluticasone furoate-vilanterol (BREO ELLIPTA) 200-25 MCG/ACT AEPB Inhale 1 puff into the lungs daily. 28 each 4   levothyroxine (SYNTHROID) 50 MCG tablet TAKE 1 TABLET(50 MCG) BY MOUTH DAILY 90 tablet 3    mupirocin ointment (BACTROBAN) 2 % Apply 1 Application topically daily. 22 g 0   pantoprazole (PROTONIX) 40 MG tablet TAKE 1 TABLET(40 MG) BY MOUTH DAILY 90 tablet 3   propranolol (INDERAL) 20 MG tablet Take 1 tablet (20 mg total) by mouth 3 (three) times daily as needed. 90 tablet 1   tiZANidine (ZANAFLEX) 4 MG tablet Take by mouth.     traMADol (ULTRAM) 50 MG tablet      pregabalin (LYRICA) 50 MG capsule Take 1 capsule (50 mg total) by mouth 2 (two) times daily. (Patient not taking: Reported on 09/16/2022) 180 capsule 0   No current facility-administered medications for this visit.     Allergies:   Codeine and Lidocaine   Social History:  The patient  reports that she has never smoked. She has never used smokeless tobacco. She reports that she does not currently use alcohol. She reports that she does not use drugs.   Family History:   family history includes Heart disease in her father and mother; Hypertension in her daughter and son.    Review of Systems: Review of Systems  Constitutional: Negative.   HENT: Negative.    Respiratory: Negative.    Cardiovascular: Negative.   Gastrointestinal: Negative.   Musculoskeletal: Negative.        Cold feet  Neurological: Negative.   Psychiatric/Behavioral: Negative.    All other systems reviewed and are negative.    PHYSICAL EXAM: VS:  BP 110/62 (BP Location: Right Arm, Patient Position: Sitting, Cuff Size: Normal)   Pulse 81   Ht '5\' 6"'$  (1.676 m)   Wt 151 lb 4 oz (68.6 kg)   SpO2 98%   BMI 24.41 kg/m  , BMI Body mass index is 24.41 kg/m. GEN: Well nourished, well developed, in no acute distress HEENT: normal Neck: no JVD, carotid bruits, or masses Cardiac: RRR; no murmurs, rubs, or gallops,no edema  Respiratory:  clear to auscultation bilaterally, normal work of breathing GI: soft, nontender, nondistended, + BS MS: no deformity or atrophy Skin: warm and dry, no rash Neuro:  Strength and sensation are intact Psych: euthymic  mood, full affect   Recent Labs: 03/31/2022: ALT 14; BUN 16; Creat 0.80; Hemoglobin 11.7; Platelets 386; Potassium 3.6; Sodium 145; TSH 1.76    Lipid Panel Lab Results  Component Value Date   CHOL 196 06/04/2021   HDL 70 06/04/2021   LDLCALC 107 (H) 06/04/2021   TRIG 97 06/04/2021      Wt Readings from Last 3 Encounters:  09/16/22 151 lb 4 oz (68.6 kg)  06/17/22 157 lb 14.4 oz (71.6 kg)  05/02/22 160 lb 1.6 oz (72.6 kg)       ASSESSMENT AND PLAN:  Problem List Items Addressed This Visit       Cardiology Problems   Chronic venous insufficiency   Relevant Medications   propranolol (INDERAL) 20 MG tablet   Essential hypertension   Relevant Medications   propranolol (INDERAL) 20 MG tablet   Other Relevant Orders   EKG [id number]   Chronic systolic (congestive) heart failure (HCC) - Primary   Relevant Medications  propranolol (INDERAL) 20 MG tablet   Other Relevant Orders   EKG 12-Lead   ECHOCARDIOGRAM COMPLETE     Other   Chronic obstructive pulmonary disease (HCC)   Dyslipidemia   Other Visit Diagnoses     Palpitations       Relevant Orders   LONG TERM MONITOR (3-14 DAYS)      COPD/asthma On inhalers/Breo Reports symptoms are stable  Lower extremity swelling Minimal leg swelling noted Echocardiogram ordered for our records, none in the system Reports that she was placed on Entresto for CHF but details unavailable  Paroxysmal tachycardia Etiology unclear, was placed on Eliquis by primary care Holter reviewed, no clear indication of atrial fibrillation/flutter We have ordered a Zio monitor for 2 weeks for further evaluation Of note frequent pauses noted on prior monitor, longest pause up to 7 cm at 9:46 AM She denies any near-syncope or syncope PVCs noted, less than 2%, PACs noted less than 1%   Total encounter time more than 60 minutes  Greater than 50% was spent in counseling and coordination of care with the patient    Signed, Esmond Plants,  M.D., Ph.D. [place], [place]

## 2022-09-16 ENCOUNTER — Ambulatory Visit (INDEPENDENT_AMBULATORY_CARE_PROVIDER_SITE_OTHER): Payer: PPO

## 2022-09-16 ENCOUNTER — Ambulatory Visit: Payer: PPO | Attending: Cardiovascular Disease | Admitting: Cardiovascular Disease

## 2022-09-16 ENCOUNTER — Encounter: Payer: Self-pay | Admitting: Cardiovascular Disease

## 2022-09-16 VITALS — BP 110/62 | HR 81 | Ht 66.0 in | Wt 151.2 lb

## 2022-09-16 DIAGNOSIS — I5022 Chronic systolic (congestive) heart failure: Secondary | ICD-10-CM

## 2022-09-16 DIAGNOSIS — R002 Palpitations: Secondary | ICD-10-CM | POA: Diagnosis not present

## 2022-09-16 DIAGNOSIS — J449 Chronic obstructive pulmonary disease, unspecified: Secondary | ICD-10-CM

## 2022-09-16 DIAGNOSIS — I872 Venous insufficiency (chronic) (peripheral): Secondary | ICD-10-CM | POA: Diagnosis not present

## 2022-09-16 DIAGNOSIS — E785 Hyperlipidemia, unspecified: Secondary | ICD-10-CM | POA: Diagnosis not present

## 2022-09-16 DIAGNOSIS — I10 Essential (primary) hypertension: Secondary | ICD-10-CM

## 2022-09-16 MED ORDER — PROPRANOLOL HCL 20 MG PO TABS: 20.0000 mg | ORAL_TABLET | Freq: Three times a day (TID) | ORAL | 1 refills | Status: DC | PRN

## 2022-09-16 NOTE — Patient Instructions (Addendum)
Medication Instructions:  Please take propranolol 20 mg up to three times a day as needed Lasts 4-6 hours  If you need a refill on your cardiac medications before your next appointment, please call your pharmacy.   Lab work: No new labs needed  Testing/Procedures: Echo for CHF Your physician has requested that you have an echocardiogram. Echocardiography is a painless test that uses sound waves to create images of your heart. It provides your doctor with information about the size and shape of your heart and how well your heart's chambers and valves are working. This procedure takes approximately one hour. There are no restrictions for this procedure. Please do NOT wear cologne, perfume, aftershave, or lotions (deodorant is allowed). Please arrive 15 minutes prior to your appointment time.  Zio monitor for palpitations Your physician has recommended that you wear a Zio monitor.  This monitor is a medical device that records the heart's electrical activity. Doctors most often use these monitors to diagnose arrhythmias. Arrhythmias are problems with the speed or rhythm of the heartbeat. The monitor is a small device applied to your chest. You can wear one while you do your normal daily activities. While wearing this monitor if you have any symptoms to push the button and record what you felt. Once you have worn this monitor for the period of time provider prescribed (Usually 14 days), you will return the monitor device in the postage paid box. Once it is returned they will download the data collected and provide [place] with a report which the provider will then review and we will call you with those results. Important tips:  Avoid showering during the first 24 hours of wearing the monitor. Avoid excessive sweating to help maximize wear time. Do not submerge the device, no hot tubs, and no swimming pools. Keep any lotions or oils away from the patch. After 24 hours you may shower with the patch on.  Take brief showers with your back facing the shower head.  Do not remove patch once it has been placed because that will interrupt data and decrease adhesive wear time. Push the button when you have any symptoms and write down what you were feeling. Once you have completed wearing your monitor, remove and place into box which has postage paid and place in your outgoing mailbox.  If for some reason you have misplaced your box then call our office and we can provide another box and/or mail it off for you.  Follow-Up: At [facility], you and your health needs are our priority.  As part of our continuing mission to provide you with exceptional heart care, we have created designated Provider Care Teams.  These Care Teams include your primary Cardiologist (physician) and Advanced Practice Providers (APPs -  Physician Assistants and Nurse Practitioners) who all work together to provide you with the care you need, when you need it.  You will need a follow up appointment in 3 months  Providers on your designated Care Team:   Murray Hodgkins, NP Christell Faith, PA-C Cadence Kathlen Mody, [place]  COVID-19 Vaccine Information can be found at: [email or web address] For questions related to vaccine distribution or appointments, please email [email or web address] or call [phone].

## 2022-09-17 DIAGNOSIS — M5441 Lumbago with sciatica, right side: Secondary | ICD-10-CM | POA: Diagnosis not present

## 2022-09-17 DIAGNOSIS — M5442 Lumbago with sciatica, left side: Secondary | ICD-10-CM | POA: Diagnosis not present

## 2022-09-17 DIAGNOSIS — G8929 Other chronic pain: Secondary | ICD-10-CM | POA: Diagnosis not present

## 2022-09-19 ENCOUNTER — Ambulatory Visit (INDEPENDENT_AMBULATORY_CARE_PROVIDER_SITE_OTHER): Payer: PPO | Admitting: Internal Medicine

## 2022-09-19 ENCOUNTER — Encounter: Payer: Self-pay | Admitting: Internal Medicine

## 2022-09-19 VITALS — BP 108/62 | HR 108 | Temp 98.0°F | Resp 16 | Ht 66.0 in | Wt 152.8 lb

## 2022-09-19 DIAGNOSIS — G629 Polyneuropathy, unspecified: Secondary | ICD-10-CM

## 2022-09-19 DIAGNOSIS — I872 Venous insufficiency (chronic) (peripheral): Secondary | ICD-10-CM

## 2022-09-19 DIAGNOSIS — E063 Autoimmune thyroiditis: Secondary | ICD-10-CM | POA: Diagnosis not present

## 2022-09-19 DIAGNOSIS — M47816 Spondylosis without myelopathy or radiculopathy, lumbar region: Secondary | ICD-10-CM

## 2022-09-19 DIAGNOSIS — Z23 Encounter for immunization: Secondary | ICD-10-CM | POA: Diagnosis not present

## 2022-09-19 DIAGNOSIS — I10 Essential (primary) hypertension: Secondary | ICD-10-CM

## 2022-09-19 DIAGNOSIS — E038 Other specified hypothyroidism: Secondary | ICD-10-CM | POA: Diagnosis not present

## 2022-09-19 DIAGNOSIS — R002 Palpitations: Secondary | ICD-10-CM | POA: Diagnosis not present

## 2022-09-19 DIAGNOSIS — E785 Hyperlipidemia, unspecified: Secondary | ICD-10-CM

## 2022-09-19 DIAGNOSIS — I5022 Chronic systolic (congestive) heart failure: Secondary | ICD-10-CM | POA: Diagnosis not present

## 2022-09-19 DIAGNOSIS — J449 Chronic obstructive pulmonary disease, unspecified: Secondary | ICD-10-CM | POA: Diagnosis not present

## 2022-09-19 DIAGNOSIS — K219 Gastro-esophageal reflux disease without esophagitis: Secondary | ICD-10-CM

## 2022-09-19 MED ORDER — OMEPRAZOLE 40 MG PO CPDR: 40.0000 mg | DELAYED_RELEASE_CAPSULE | Freq: Every day | ORAL | 1 refills | Status: DC

## 2022-09-19 MED ORDER — ALBUTEROL SULFATE HFA 108 (90 BASE) MCG/ACT IN AERS: 2.0000 | INHALATION_SPRAY | Freq: Four times a day (QID) | RESPIRATORY_TRACT | 2 refills | Status: DC | PRN

## 2022-09-19 MED ORDER — DULOXETINE HCL 60 MG PO CPEP: 60.0000 mg | ORAL_CAPSULE | Freq: Every day | ORAL | 0 refills | Status: DC

## 2022-09-19 NOTE — Patient Instructions (Addendum)
It was great seeing you today!  Plan discussed at today's visit: -Change Cymbalta to 60 mg daily, discontinue Lyrica -Switch Pantoprazole to Omeprazole for acid reflux -Albuterol rescue inhaler send to pharmacy  -Prevnar administered   Follow up in: 1 month   Take care and let us know if you have any questions or concerns prior to your next visit.  Dr. Rosana Berger

## 2022-09-19 NOTE — Progress Notes (Signed)
New Patient Office Visit  Subjective    Patient ID: Teresa Chambers, female    DOB: Nov 19, 1935  Age: 87 y.o. MRN: [name]  CC:  Chief Complaint  Patient presents with   Establish Care    Specialist-cardio, derm, vascular    HPI Teresa Chambers presents to establish care. Had been following with Dr. Lavera Guise but he has retired.   Hypertension/CHF/Questionable A. Fib: -Follows with Cardiology, Dr. Rockey Situ. Last seen 09/16/22 -Medications: Entreso, Eiquis 2.5 mg BID, Propanolol 20 mg TID -Patient is compliant with above medications and reports no side effects. -Denies any SOB, CP, vision changes, LE edema or symptoms of hypotension -Planning for echo soon, uncertain about Eliquis, may be able to discontinue but tolerating well for now.   HLD: -Medications: Zetia 10 mg -Patient is compliant with above medications and reports no side effects.  -Last lipid panel: Lipid Panel     Component Value Date/Time   CHOL 196 06/04/2021 0910   TRIG 97 06/04/2021 0910   HDL 70 06/04/2021 0910   CHOLHDL 2.8 06/04/2021 0910   VLDL 16 03/29/2019 0951   LDLCALC 107 (H) 06/04/2021 0910    COPD: -COPD status: stable -Current medications: Breo Ellipta, does not have a rescue inhaler -Satisfied with current treatment?: yes -Oxygen use: no -Dyspnea frequency: rarely -Cough frequency: none -Rescue inhaler frequency:  none -Limitation of activity: no -Pneumovax: Not up to Date -Influenza: Up to Date  Hypothyroidism: -Medications: Levothyroxine 50 mcg  -Patient is compliant with the above medication (s) at the above dose and reports no medication side effects.  -Denies weight changes, cold./heat intolerance, skin changes, anxiety/palpitations  -Last TSH: 9/23 1.76  GERD: -Currently on Protonix 40 mg, symptoms well controlled  Chronic Venous Insufficiency: -Follows with vascular, Dr. Lucky Cowboy, last seen 08/05/22 -Considering laser ablation in the future   Arthritis/Lumbar  Radiculopathy: -Currently on Cymbalta 30 mg and Lyrica 50 mg as needed - still having neuropathic symptoms -Also on Tramadol 50 mg PRN   Outpatient Encounter Medications as of 09/19/2022  Medication Sig   acetaminophen (TYLENOL 8 HOUR ARTHRITIS PAIN) 650 MG CR tablet    apixaban (ELIQUIS) 2.5 MG TABS tablet Take 1 tablet (2.5 mg total) by mouth 2 (two) times daily.   Ascorbic Acid (VITAMIN C WITH ROSE HIPS) 1000 MG tablet Take 1,000 mg by mouth daily.   Calcium Carbonate-Vitamin D 600-400 MG-UNIT tablet Take 1 tablet by mouth daily.   DULoxetine (CYMBALTA) 30 MG capsule Take 1 capsule (30 mg total) by mouth daily.   ENTRESTO 24-26 MG TAKE 1 TABLET BY MOUTH TWICE DAILY   ezetimibe (ZETIA) 10 MG tablet TAKE 1 TABLET(10 MG) BY MOUTH DAILY   ferrous sulfate 325 (65 FE) MG EC tablet Take 325 mg by mouth 3 (three) times daily with meals.   fluticasone (FLONASE) 50 MCG/ACT nasal spray SHAKE LIQUID AND USE 2 SPRAYS IN EACH NOSTRIL DAILY   fluticasone furoate-vilanterol (BREO ELLIPTA) 200-25 MCG/ACT AEPB Inhale 1 puff into the lungs daily.   levothyroxine (SYNTHROID) 50 MCG tablet TAKE 1 TABLET(50 MCG) BY MOUTH DAILY   mupirocin ointment (BACTROBAN) 2 % Apply 1 Application topically daily.   pantoprazole (PROTONIX) 40 MG tablet TAKE 1 TABLET(40 MG) BY MOUTH DAILY   pregabalin (LYRICA) 50 MG capsule Take 1 capsule (50 mg total) by mouth 2 (two) times daily.   propranolol (INDERAL) 20 MG tablet Take 1 tablet (20 mg total) by mouth 3 (three) times daily as needed.   tiZANidine (ZANAFLEX) 4 MG  tablet Take by mouth.   traMADol (ULTRAM) 50 MG tablet    No facility-administered encounter medications on file as of 09/19/2022.    Past Medical History:  Diagnosis Date   Actinic keratosis    Acute thoracic back pain    Allergy    Anemia    Arthritis    Asthma    Basal cell carcinoma    nose    Chest pain    unspecified   CHF (congestive heart failure) ([place]) 04/30/2017   Chronic abdominal pain  05/01/2017   unspecified   Congenital heart failure (HCC)    COPD (chronic obstructive pulmonary disease) ([place])    Disease of upper respiratory system 04/30/2017   Dyspnea on exertion    Esophageal reflux disease 04/30/2017   History of bone density study 06/28/2004   Leg swelling    Lumbar arthropathy    Nasopharyngitis 04/30/2017   Neuromuscular disorder ([place])    Osteoarthropathy 04/30/2017   Osteoporosis 04/30/2017   Palpitations    Sinusitis, acute 04/30/2017   unspecified   Squamous cell carcinoma of skin 07/28/2017   Right medial knee. Hypertrophic SCCis. Tx: [facility] 07/28/2017   Thyroid disease    Hashimoto disease    Past Surgical History:  Procedure Laterality Date   CATARACT EXTRACTION     07/21/2001 - 07/20/2002   CHOLECYSTECTOMY     07/21/1997 - 07/20/1998   COLON SURGERY     COLONOSCOPY     05/05/2000, 01/17/2000 Adenomatous Polyps     COLONOSCOPY     11/05/2009, 12/23/2004, 07/07/2001   PH Adenomatous Polyps: CBF 10/2014; recall ltr mailed 09/18/2014 (dw)    ESOPHAGOGASTRODUODENOSCOPY     11/05/2009, 05/05/2000   EYE SURGERY     FLEXIBLE SIGMOIDOSCOPY  11/28/1999   HAND SURGERY  2006   HERNIA REPAIR     HIP ARTHROPLASTY Right 02/11/2018   Procedure: ARTHROPLASTY BIPOLAR HIP (HEMIARTHROPLASTY);  Surgeon: Corky Mull, MD;  Location: [facility] ORS;  Service: Orthopedics;  Laterality: Right;   JOINT REPLACEMENT     LAPAROSCOPIC COLON RESECTION     2001    Family History  Problem Relation Age of Onset   Heart disease Mother    Heart disease Father    Hypertension Son    Hypertension Daughter     Social History   Socioeconomic History   Marital status: Divorced    Spouse name: Not on file   Number of children: 5   Years of education: 15.5   Highest education level: High school graduate  Occupational History   Occupation: retired  Tobacco Use   Smoking status: Never   Smokeless tobacco: Never  Scientific laboratory technician Use: Never used  Substance and Sexual  Activity   Alcohol use: Not Currently   Drug use: Never   Sexual activity: Not Currently  Other Topics Concern   Not on file  Social History Narrative   Lives independently, has home at [facility] she enjoys traveling to   Social Determinants of Health   Financial Resource Strain: [place]  (06/07/2021)   Overall Financial Resource Strain (CARDIA)    Difficulty of Paying Living Expenses: Not hard at all  Food Insecurity: No Food Insecurity (06/07/2021)   Hunger Vital Sign    Worried About Running Out of Food in the Last Year: Never true    [place] in the Last Year: Never true  Transportation Needs: No Transportation Needs (03/09/2018)   [place] - Transportation  Lack of Transportation (Medical): No    Lack of Transportation (Non-Medical): No  Physical Activity: Inactive (06/07/2021)   Exercise Vital Sign    Days of Exercise per Week: 0 days    Minutes of Exercise per Session: 0 min  Stress: No Stress Concern Present (06/07/2021)   [place]    Feeling of Stress : Only a little  Social Connections: Socially Isolated (06/07/2021)   Social Connection and Isolation Panel [NHANES]    Frequency of Communication with Friends and Family: More than three times a week    Frequency of Social Gatherings with Friends and Family: More than three times a week    Attends Religious Services: Never    Marine scientist or Organizations: No    Attends Archivist Meetings: Never    Marital Status: Divorced  Human resources officer Violence: Not At Risk (06/07/2021)   Humiliation, Afraid, Rape, and Kick questionnaire    Fear of Current or Ex-Partner: No    Emotionally Abused: No    Physically Abused: No    Sexually Abused: No    Review of Systems  All other systems reviewed and are negative.       Objective    BP 108/62   Pulse (!) 108   Temp 98 F (36.7 C)   Resp 16   Ht '5\' 6"'$  (1.676 m)   Wt  152 lb 12.8 oz (69.3 kg)   SpO2 97%   BMI 24.66 kg/m   Physical Exam Constitutional:      Appearance: Normal appearance.  HENT:     Head: Normocephalic and atraumatic.  Eyes:     Conjunctiva/sclera: Conjunctivae normal.  Cardiovascular:     Rate and Rhythm: Normal rate and regular rhythm.  Pulmonary:     Effort: Pulmonary effort is normal.     Breath sounds: Normal breath sounds.  Musculoskeletal:     Right lower leg: No edema.     Left lower leg: No edema.  Skin:    General: Skin is warm and dry.     Comments: Chronic venous stasis dermatitis in BLE  Neurological:     General: No focal deficit present.     Mental Status: She is alert. Mental status is at baseline.  Psychiatric:        Mood and Affect: Mood normal.        Behavior: Behavior normal.         Assessment & Plan:   1. Essential hypertension/Chronic systolic (congestive) heart failure ([place]): Following with Cardiology, note reviewed from 09/16/22. Continue Entresto. Just started on Propanolol, doing well. Also on Eliquis, uncertain diagnosis of atrial fibrillation.  2. Dyslipidemia: Stable, continue Zetia 10 mg.  3. Chronic obstructive pulmonary disease, unspecified COPD type ([place]): Stable on Breo daily, will prescribe Albuterol to use as needed as well.   - albuterol (VENTOLIN HFA) 108 (90 Base) MCG/ACT inhaler; Inhale 2 puffs into the lungs every 6 (six) hours as needed for wheezing or shortness of breath.  Dispense: 8 g; Refill: 2  4. Hypothyroidism due to Hashimoto's thyroiditis: Stable, continue Levothyroxine 50 mcg daily.   5. GERD without esophagitis: Discontinue Pantoprazole, switch back to Omeprazole at patient's request.   - omeprazole (PRILOSEC) 40 MG capsule; Take 1 capsule (40 mg total) by mouth daily.  Dispense: 90 capsule; Refill: 1  6. Chronic venous insufficiency: Following with vascular surgery, note reviewed from 08/05/22.  7. Lumbar arthropathy/Neuropathy: Discontinue Lyrica for now,  increase Cymbalta to 60 mg daily. Recheck in 1 month   - DULoxetine (CYMBALTA) 60 MG capsule; Take 1 capsule (60 mg total) by mouth daily.  Dispense: 90 capsule; Refill: 0  8. Vaccine for streptococcus pneumoniae and influenza: Prevnar 20 administered today.   - Pneumococcal conjugate vaccine 20-valent (Prevnar 20)    Return in 4 weeks (on 10/17/2022) for 1 month medical follow up, AWV.   Teodora Medici, DO

## 2022-10-06 ENCOUNTER — Ambulatory Visit: Payer: PPO | Attending: Cardiovascular Disease

## 2022-10-06 DIAGNOSIS — I5022 Chronic systolic (congestive) heart failure: Secondary | ICD-10-CM

## 2022-10-06 LAB — ECHOCARDIOGRAM COMPLETE
AR max vel: 2.6 cm2
AV Area VTI: 2.13 cm2
AV Area mean vel: 2.49 cm2
AV Mean grad: 6 mmHg
AV Peak grad: 11.3 mmHg
Ao pk vel: 1.68 m/s
Area-P 1/2: 4.19 cm2
Calc EF: 61.5 %
MV VTI: 1.65 cm2
S' Lateral: 2.5 cm
Single Plane A2C EF: 73.5 %
Single Plane A4C EF: 53.6 %

## 2022-10-09 ENCOUNTER — Other Ambulatory Visit: Payer: Self-pay | Admitting: Cardiovascular Disease

## 2022-10-13 ENCOUNTER — Encounter: Payer: Self-pay | Admitting: Cardiovascular Disease

## 2022-10-15 ENCOUNTER — Telehealth: Payer: Self-pay | Admitting: Cardiovascular Disease

## 2022-10-15 ENCOUNTER — Telehealth: Payer: Self-pay | Admitting: *Deleted

## 2022-10-15 MED ORDER — METOPROLOL SUCCINATE ER 25 MG PO TB24: 25.0000 mg | ORAL_TABLET | Freq: Every day | ORAL | 3 refills | Status: DC

## 2022-10-15 MED ORDER — APIXABAN 5 MG PO TABS: 5.0000 mg | ORAL_TABLET | Freq: Two times a day (BID) | ORAL | 6 refills | Status: DC

## 2022-10-15 NOTE — Telephone Encounter (Signed)
Reviewed results and recommendations with patient. She verbalized understanding with no further questions at this time.  ?

## 2022-10-15 NOTE — Telephone Encounter (Signed)
Pt c/o medication issue:  1. Name of Medication:   ENTRESTO 24-26 MG  metoprolol succinate (TOPROL-XL) 25 MG 24 hr tablet   2. How are you currently taking this medication (dosage and times per day)?   3. Are you having a reaction (difficulty breathing--STAT)?   4. What is your medication issue?   Patient wants to know if she should be taking both of these medications together.  Patient noted she was prescribed the metoprolol succinate (TOPROL-XL) 25 MG 24 hr tablet today and has not started on this medication as yet.

## 2022-10-15 NOTE — Progress Notes (Unsigned)
Established Patient Office Visit  Subjective    Patient ID: Teresa Chambers, female    DOB: [birth year]  Age: 87 y.o. MRN: [id number]  CC:  No chief complaint on file.   HPI Teresa Chambers presents to follow up.   Hypertension/CHF/Questionable A. Fib: -Follows with Cardiology, Dr. Rockey Situ. Last seen 09/16/22 -Medications: Entreso, Eiquis increased to 5 mg BID, Propanolol 20 mg TID switched to Metoprolol 25 mg -Patient is compliant with above medications and reports no side effects. -Denies any SOB, CP, vision changes, LE edema or symptoms of hypotension -Planning for echo soon, uncertain about Eliquis, may be able to discontinue but tolerating well for now.   HLD: -Medications: Zetia 10 mg -Patient is compliant with above medications and reports no side effects.  -Last lipid panel: Lipid Panel     Component Value Date/Time   CHOL 196 06/04/2021 0910   TRIG 97 06/04/2021 0910   HDL 70 06/04/2021 0910   CHOLHDL 2.8 06/04/2021 0910   VLDL 16 03/29/2019 0951   LDLCALC 107 (H) 06/04/2021 0910    COPD: -COPD status: stable -Current medications: Breo Ellipta, does not have a rescue inhaler -Satisfied with current treatment?: yes -Oxygen use: no -Dyspnea frequency: rarely -Cough frequency: none -Rescue inhaler frequency:  none -Limitation of activity: no -Pneumovax: Not up to Date -Influenza: Up to Date  Hypothyroidism: -Medications: Levothyroxine 50 mcg  -Patient is compliant with the above medication (s) at the above dose and reports no medication side effects.  -Denies weight changes, cold./heat intolerance, skin changes, anxiety/palpitations  -Last TSH: 9/23 1.76  GERD: -Currently on Protonix 40 mg, symptoms well controlled  Chronic Venous Insufficiency: -Follows with vascular, Dr. Lucky Cowboy, last seen 08/05/22 -Considering laser ablation in the future   Arthritis/Lumbar Radiculopathy: -Currently on Cymbalta 60 mg. Lyrica discontinued at [place] -Also on Tramadol 50 mg  PRN   Outpatient Encounter Medications as of 10/16/2022  Medication Sig   acetaminophen (TYLENOL 8 HOUR ARTHRITIS PAIN) 650 MG CR tablet    albuterol (VENTOLIN HFA) 108 (90 Base) MCG/ACT inhaler Inhale 2 puffs into the lungs every 6 (six) hours as needed for wheezing or shortness of breath.   apixaban (ELIQUIS) 5 MG TABS tablet Take 1 tablet (5 mg total) by mouth 2 (two) times daily.   Ascorbic Acid (VITAMIN C WITH ROSE HIPS) 1000 MG tablet Take 1,000 mg by mouth daily.   Calcium Carbonate-Vitamin D 600-400 MG-UNIT tablet Take 1 tablet by mouth daily.   DULoxetine (CYMBALTA) 60 MG capsule Take 1 capsule (60 mg total) by mouth daily.   ENTRESTO 24-26 MG TAKE 1 TABLET BY MOUTH TWICE DAILY   ezetimibe (ZETIA) 10 MG tablet TAKE 1 TABLET(10 MG) BY MOUTH DAILY   ferrous sulfate 325 (65 FE) MG EC tablet Take 325 mg by mouth 3 (three) times daily with meals.   fluticasone (FLONASE) 50 MCG/ACT nasal spray SHAKE LIQUID AND USE 2 SPRAYS IN EACH NOSTRIL DAILY   fluticasone furoate-vilanterol (BREO ELLIPTA) 200-25 MCG/ACT AEPB Inhale 1 puff into the lungs daily.   levothyroxine (SYNTHROID) 50 MCG tablet TAKE 1 TABLET(50 MCG) BY MOUTH DAILY   metoprolol succinate (TOPROL-XL) 25 MG 24 hr tablet Take 1 tablet (25 mg total) by mouth daily.   mupirocin ointment (BACTROBAN) 2 % Apply 1 Application topically daily.   omeprazole (PRILOSEC) 40 MG capsule Take 1 capsule (40 mg total) by mouth daily.   propranolol (INDERAL) 20 MG tablet TAKE 1 TABLET BY MOUTH 3 TIMES DAILY AS NEEDED.  tiZANidine (ZANAFLEX) 4 MG tablet Take by mouth.   traMADol (ULTRAM) 50 MG tablet    No facility-administered encounter medications on file as of 10/16/2022.    Past Medical History:  Diagnosis Date   Actinic keratosis    Acute thoracic back pain    Allergy    Anemia    Arthritis    Asthma    Basal cell carcinoma    nose    Chest pain    unspecified   CHF (congestive heart failure) ([place]) 04/30/2017   Chronic abdominal  pain 05/01/2017   unspecified   Congenital heart failure (HCC)    COPD (chronic obstructive pulmonary disease) ([place])    Disease of upper respiratory system 04/30/2017   Dyspnea on exertion    Esophageal reflux disease 04/30/2017   History of bone density study 06/28/2004   Leg swelling    Lumbar arthropathy    Nasopharyngitis 04/30/2017   Neuromuscular disorder ([place])    Osteoarthropathy 04/30/2017   Osteoporosis 04/30/2017   Palpitations    Sinusitis, acute 04/30/2017   unspecified   Squamous cell carcinoma of skin 07/28/2017   Right medial knee. Hypertrophic SCCis. Tx: [facility] 07/28/2017   Thyroid disease    Hashimoto disease    Past Surgical History:  Procedure Laterality Date   CATARACT EXTRACTION     07/21/2001 - 07/20/2002   CHOLECYSTECTOMY     07/21/1997 - 07/20/1998   COLON SURGERY     COLONOSCOPY     05/05/2000, 01/17/2000 Adenomatous Polyps     COLONOSCOPY     11/05/2009, 12/23/2004, 07/07/2001   PH Adenomatous Polyps: [facility] 10/2014; recall ltr mailed 09/18/2014 (dw)    ESOPHAGOGASTRODUODENOSCOPY     11/05/2009, 05/05/2000   EYE SURGERY     FLEXIBLE SIGMOIDOSCOPY  11/28/1999   HAND SURGERY  2006   HERNIA REPAIR     HIP ARTHROPLASTY Right 02/11/2018   Procedure: ARTHROPLASTY BIPOLAR HIP (HEMIARTHROPLASTY);  Surgeon: Corky Mull, MD;  Location: [facility] ORS;  Service: Orthopedics;  Laterality: Right;   JOINT REPLACEMENT     LAPAROSCOPIC COLON RESECTION     2001    Family History  Problem Relation Age of Onset   Heart disease Mother    Heart disease Father    Hypertension Son    Hypertension Daughter     Social History   Socioeconomic History   Marital status: Divorced    Spouse name: Not on file   Number of children: 5   Years of education: 15.5   Highest education level: Some college, no degree  Occupational History   Occupation: retired  Tobacco Use   Smoking status: Never   Smokeless tobacco: Never  Vaping Use   Vaping Use: Never used  Substance and  Sexual Activity   Alcohol use: Not Currently   Drug use: Never   Sexual activity: Not Currently  Other Topics Concern   Not on file  Social History Narrative   Lives independently, has home at [facility] she enjoys traveling to   Social Determinants of Health   Financial Resource Strain: [place]  (10/12/2022)   Overall Financial Resource Strain (CARDIA)    Difficulty of Paying Living Expenses: Not hard at all  Food Insecurity: No Food Insecurity (10/12/2022)   Hunger Vital Sign    Worried About Running Out of Food in the Last Year: Never true    [place] in the Last Year: Never true  Transportation Needs: No Transportation Needs (10/12/2022)   [place] -  Hydrologist (Medical): No    Lack of Transportation (Non-Medical): No  Physical Activity: Insufficiently Active (10/12/2022)   Exercise Vital Sign    Days of Exercise per Week: 5 days    Minutes of Exercise per Session: 20 min  Stress: No Stress Concern Present (10/12/2022)   [place]    Feeling of Stress : Not at all  Social Connections: Socially Isolated (10/12/2022)   Social Connection and Isolation Panel [NHANES]    Frequency of Communication with Friends and Family: More than three times a week    Frequency of Social Gatherings with Friends and Family: Once a week    Attends Religious Services: Never    Marine scientist or Organizations: No    Attends Music therapist: Not on file    Marital Status: Widowed  Intimate Partner Violence: Not At Risk (06/07/2021)   Humiliation, Afraid, Rape, and Kick questionnaire    Fear of Current or Ex-Partner: No    Emotionally Abused: No    Physically Abused: No    Sexually Abused: No    Review of Systems  All other systems reviewed and are negative.       Objective    There were no vitals taken for this visit.  Physical Exam Constitutional:       Appearance: Normal appearance.  HENT:     Head: Normocephalic and atraumatic.  Eyes:     Conjunctiva/sclera: Conjunctivae normal.  Cardiovascular:     Rate and Rhythm: Normal rate and regular rhythm.  Pulmonary:     Effort: Pulmonary effort is normal.     Breath sounds: Normal breath sounds.  Musculoskeletal:     Right lower leg: No edema.     Left lower leg: No edema.  Skin:    General: Skin is warm and dry.     Comments: Chronic venous stasis dermatitis in BLE  Neurological:     General: No focal deficit present.     Mental Status: She is alert. Mental status is at baseline.  Psychiatric:        Mood and Affect: Mood normal.        Behavior: Behavior normal.         Assessment & Plan:   1. Essential hypertension/Chronic systolic (congestive) heart failure ([place]): Following with Cardiology, note reviewed from 09/16/22. Continue Entresto. Just started on Propanolol, doing well. Also on Eliquis, uncertain diagnosis of atrial fibrillation.  2. Dyslipidemia: Stable, continue Zetia 10 mg.  3. Chronic obstructive pulmonary disease, unspecified COPD type ([place]): Stable on Breo daily, will prescribe Albuterol to use as needed as well.   - albuterol (VENTOLIN HFA) 108 (90 Base) MCG/ACT inhaler; Inhale 2 puffs into the lungs every 6 (six) hours as needed for wheezing or shortness of breath.  Dispense: 8 g; Refill: 2  4. Hypothyroidism due to Hashimoto's thyroiditis: Stable, continue Levothyroxine 50 mcg daily.   5. GERD without esophagitis: Discontinue Pantoprazole, switch back to Omeprazole at patient's request.   - omeprazole (PRILOSEC) 40 MG capsule; Take 1 capsule (40 mg total) by mouth daily.  Dispense: 90 capsule; Refill: 1  6. Chronic venous insufficiency: Following with vascular surgery, note reviewed from 08/05/22.  7. Lumbar arthropathy/Neuropathy: Discontinue Lyrica for now, increase Cymbalta to 60 mg daily. Recheck in 1 month   - DULoxetine (CYMBALTA) 60 MG capsule;  Take 1 capsule (60 mg total) by mouth daily.  Dispense: 90 capsule;  Refill: 0  8. Vaccine for streptococcus pneumoniae and influenza: Prevnar 20 administered today.   - Pneumococcal conjugate vaccine 20-valent (Prevnar 20)    No follow-ups on file.   Teodora Medici, DO

## 2022-10-15 NOTE — Telephone Encounter (Signed)
Spoke with patient and she just wanted to make sure she was to take both medications. Confirmed that she should be taking both. She did mention high cost of Entresto and advised that I would be happy to assist with patient assistance application if she would like to get the necessary documents together. She was appreciative and had no further questions at this time.

## 2022-10-15 NOTE — Telephone Encounter (Signed)
-----   Message from Minna Merritts, MD sent at 10/15/2022  3:30 PM EDT ----- Event monitor Rare episodes of atrial flutter Also having other episodes of tachycardia, several per day, longest is 8 minutes with rates more than 150 bpm Would recommend starting metoprolol succinate 25 mg daily Given the atrial flutter would increase Eliquis up to 5 mg twice a day, (previously prescribed 2.5 typically only used in renal failure)

## 2022-10-16 ENCOUNTER — Ambulatory Visit (INDEPENDENT_AMBULATORY_CARE_PROVIDER_SITE_OTHER): Payer: PPO | Admitting: Internal Medicine

## 2022-10-16 ENCOUNTER — Encounter: Payer: Self-pay | Admitting: Internal Medicine

## 2022-10-16 VITALS — BP 128/72 | HR 112 | Temp 98.1°F | Resp 16 | Ht 66.0 in | Wt 150.6 lb

## 2022-10-16 DIAGNOSIS — I4892 Unspecified atrial flutter: Secondary | ICD-10-CM

## 2022-10-16 DIAGNOSIS — K219 Gastro-esophageal reflux disease without esophagitis: Secondary | ICD-10-CM

## 2022-10-16 DIAGNOSIS — I10 Essential (primary) hypertension: Secondary | ICD-10-CM

## 2022-10-16 DIAGNOSIS — I5022 Chronic systolic (congestive) heart failure: Secondary | ICD-10-CM | POA: Diagnosis not present

## 2022-10-16 DIAGNOSIS — G629 Polyneuropathy, unspecified: Secondary | ICD-10-CM

## 2022-10-16 NOTE — Patient Instructions (Addendum)
It was great seeing you today!  Plan discussed at today's visit: -For heart medications: Take Entresto, Metoprolol 25 mg and Eliquis 5 mg twice a day  -Continue Cymbalta 60 mg -Continue Prilosec 40 mg daily -Continue Zyrtec, Flonase and can try Astelin nasal spray for allergies   Follow up in: 6 months for CPE  Take care and let us know if you have any questions or concerns prior to your next visit.  Dr. Rosana Berger

## 2022-11-04 ENCOUNTER — Ambulatory Visit (INDEPENDENT_AMBULATORY_CARE_PROVIDER_SITE_OTHER): Payer: PPO | Admitting: Vascular Surgery

## 2022-11-04 ENCOUNTER — Encounter (INDEPENDENT_AMBULATORY_CARE_PROVIDER_SITE_OTHER): Payer: Self-pay | Admitting: Vascular Surgery

## 2022-11-04 VITALS — BP 108/69 | HR 73 | Resp 16 | Wt 155.0 lb

## 2022-11-04 DIAGNOSIS — I890 Lymphedema, not elsewhere classified: Secondary | ICD-10-CM

## 2022-11-04 DIAGNOSIS — I10 Essential (primary) hypertension: Secondary | ICD-10-CM

## 2022-11-04 DIAGNOSIS — I5082 Biventricular heart failure: Secondary | ICD-10-CM

## 2022-11-04 NOTE — Progress Notes (Signed)
MRN : [id number]  Teresa Chambers is a 87 y.o. (01/15/1936) female who presents with chief complaint of  Chief Complaint  Patient presents with   Follow-up    3 month follow up  .  History of Present Illness: Patient returns today in follow up of her leg swelling and pain.  Her leg swelling is significantly better than it was at her last visit.  It sounds like her heart medications have changed and have gotten her heart function under better control.  No ulceration or infection.  She says her legs feel a lot better as well.  Continuing to use compression and elevation.  Current Outpatient Medications  Medication Sig Dispense Refill   acetaminophen (TYLENOL 8 HOUR ARTHRITIS PAIN) 650 MG CR tablet      albuterol (VENTOLIN HFA) 108 (90 Base) MCG/ACT inhaler Inhale 2 puffs into the lungs every 6 (six) hours as needed for wheezing or shortness of breath. 8 g 2   apixaban (ELIQUIS) 5 MG TABS tablet Take 1 tablet (5 mg total) by mouth 2 (two) times daily. 60 tablet 6   Ascorbic Acid (VITAMIN C WITH ROSE HIPS) 1000 MG tablet Take 1,000 mg by mouth daily.     Calcium Carbonate-Vitamin D 600-400 MG-UNIT tablet Take 1 tablet by mouth daily.     DULoxetine (CYMBALTA) 60 MG capsule Take 1 capsule (60 mg total) by mouth daily. 90 capsule 0   ENTRESTO 24-26 MG TAKE 1 TABLET BY MOUTH TWICE DAILY 180 tablet 1   ezetimibe (ZETIA) 10 MG tablet TAKE 1 TABLET(10 MG) BY MOUTH DAILY 90 tablet 3   ferrous sulfate 325 (65 FE) MG EC tablet Take 325 mg by mouth 3 (three) times daily with meals.     fluticasone (FLONASE) 50 MCG/ACT nasal spray SHAKE LIQUID AND USE 2 SPRAYS IN EACH NOSTRIL DAILY 48 g 6   fluticasone furoate-vilanterol (BREO ELLIPTA) 200-25 MCG/ACT AEPB Inhale 1 puff into the lungs daily. 28 each 4   levothyroxine (SYNTHROID) 50 MCG tablet TAKE 1 TABLET(50 MCG) BY MOUTH DAILY 90 tablet 3   metoprolol succinate (TOPROL-XL) 25 MG 24 hr tablet Take 1 tablet (25 mg total) by mouth daily. 90 tablet 3    mupirocin ointment (BACTROBAN) 2 % Apply 1 Application topically daily. 22 g 0   omeprazole (PRILOSEC) 40 MG capsule Take 1 capsule (40 mg total) by mouth daily. 90 capsule 1   tiZANidine (ZANAFLEX) 4 MG tablet Take by mouth.     traMADol (ULTRAM) 50 MG tablet      No current facility-administered medications for this visit.    Past Medical History:  Diagnosis Date   Actinic keratosis    Acute thoracic back pain    Allergy    Anemia    Arthritis    Asthma    Basal cell carcinoma    nose    Chest pain    unspecified   CHF (congestive heart failure) 04/30/2017   Chronic abdominal pain 05/01/2017   unspecified   Congenital heart failure    COPD (chronic obstructive pulmonary disease)    Disease of upper respiratory system 04/30/2017   Dyspnea on exertion    Esophageal reflux disease 04/30/2017   History of bone density study 06/28/2004   Leg swelling    Lumbar arthropathy    Nasopharyngitis 04/30/2017   Neuromuscular disorder    Osteoarthropathy 04/30/2017   Osteoporosis 04/30/2017   Palpitations    Sinusitis, acute 04/30/2017   unspecified   Squamous  cell carcinoma of skin 07/28/2017   Right medial knee. Hypertrophic SCCis. Tx: [facility] 07/28/2017   Thyroid disease    Hashimoto disease    Past Surgical History:  Procedure Laterality Date   CATARACT EXTRACTION     07/21/2001 - 07/20/2002   CHOLECYSTECTOMY     07/21/1997 - 07/20/1998   COLON SURGERY     COLONOSCOPY     05/05/2000, 01/17/2000 Adenomatous Polyps     COLONOSCOPY     11/05/2009, 12/23/2004, 07/07/2001   PH Adenomatous Polyps: CBF 10/2014; recall ltr mailed 09/18/2014 (dw)    ESOPHAGOGASTRODUODENOSCOPY     11/05/2009, 05/05/2000   EYE SURGERY     FLEXIBLE SIGMOIDOSCOPY  11/28/1999   HAND SURGERY  2006   HERNIA REPAIR     HIP ARTHROPLASTY Right 02/11/2018   Procedure: ARTHROPLASTY BIPOLAR HIP (HEMIARTHROPLASTY);  Surgeon: Christena Flake, MD;  Location: [facility] ORS;  Service: Orthopedics;  Laterality: Right;   JOINT  REPLACEMENT     LAPAROSCOPIC COLON RESECTION     2001     Social History   Tobacco Use   Smoking status: Never   Smokeless tobacco: Never  Vaping Use   Vaping Use: Never used  Substance Use Topics   Alcohol use: Not Currently   Drug use: Never      Family History  Problem Relation Age of Onset   Heart disease Mother    Heart disease Father    Hypertension Son    Hypertension Daughter      Allergies  Allergen Reactions   Codeine Nausea And Vomiting   Lidocaine Swelling and Anaphylaxis    REVIEW OF SYSTEMS (Negative unless checked) Constitutional: [] Weight loss  [] Fever  [] Chills Cardiac: [] Chest pain   [] Chest pressure   [x] Palpitations   [] Shortness of breath when laying flat   [] Shortness of breath at rest   [] Shortness of breath with exertion. Vascular:  [x] Pain in legs with walking   [x] Pain in legs at rest   [] Pain in legs when laying flat   [] Claudication   [] Pain in feet when walking  [] Pain in feet at rest  [] Pain in feet when laying flat   [] History of DVT   [] Phlebitis   [x] Swelling in legs   [] Varicose veins   [] Non-healing ulcers Pulmonary:   [] Uses home oxygen   [] Productive cough   [] Hemoptysis   [] Wheeze  [x] COPD   [x] Asthma Neurologic:  [] Dizziness  [] Blackouts   [] Seizures   [] History of stroke   [] History of TIA  [] Aphasia   [] Temporary blindness   [] Dysphagia   [] Weakness or numbness in arms   [] Weakness or numbness in legs Musculoskeletal:  [] Arthritis   [] Joint swelling   [] Joint pain   [] Low back pain Hematologic:  [x] Easy bruising  [] Easy bleeding   [] Hypercoagulable state   [] Anemic  [] Hepatitis Gastrointestinal:  [] Blood in stool   [] Vomiting blood  [] Gastroesophageal reflux/heartburn   [] Abdominal pain Genitourinary:  [] Chronic kidney disease   [] Difficult urination  [] Frequent urination  [] Burning with urination   [] Hematuria Skin:  [] Rashes   [] Ulcers   [] Wounds Psychological:  [] History of anxiety   []  History of major depression  Physical  Examination  BP 108/69 (BP Location: Left Arm)   Pulse 73   Resp 16   Wt 155 lb (70.3 kg)   BMI 25.02 kg/m  Gen:  WD/WN, NAD.  Appears younger than stated age Head: [place]/AT, No temporalis wasting. Ear/Nose/Throat: Hearing grossly intact, nares w/o erythema or drainage Eyes: Conjunctiva clear. Sclera non-icteric  Neck: Supple.  Trachea midline Pulmonary:  Good air movement, no use of accessory muscles.  Cardiac: Irregular Vascular:  Vessel Right Left  Radial Palpable Palpable           Musculoskeletal: M/S 5/5 throughout.  No deformity or atrophy.  Minimal bilateral lower extremity edema.  Moderate stasis dermatitis changes are present Neurologic: Sensation grossly intact in extremities.  Symmetrical.  Speech is fluent.  Psychiatric: Judgment intact, Mood & affect appropriate for pt's clinical situation. Dermatologic: No rashes or ulcers noted.  No cellulitis or open wounds.      Labs Recent Results (from the past 2160 hour(s))  ECHOCARDIOGRAM COMPLETE     Status: None   Collection Time: 10/06/22  1:52 PM  Result Value Ref Range   AR max vel 2.60 cm2   AV Peak grad 11.3 mmHg   Ao pk vel 1.68 m/s   S' Lateral 2.50 cm   Area-P 1/2 4.19 cm2   AV Area VTI 2.13 cm2   AV Mean grad 6.0 mmHg   Single Plane A4C EF 53.6 %   Single Plane A2C EF 73.5 %   Calc EF 61.5 %   AV Area mean vel 2.49 cm2   MV VTI 1.65 cm2   Est EF 55 - 60%     Radiology LONG TERM MONITOR (3-14 DAYS)  Result Date: 10/15/2022 Event monitor Patch Wear Time:  13 days and 23 hours (2024-03-01T15:10:21-0500 to 2024-03-15T16:10:17-0400) Normal sinus rhythm Patient had a min HR of 58 bpm, max HR of 190 bpm, and avg HR of 85 bpm. 93 Supraventricular Tachycardia runs occurred, the run with the fastest interval lasting 5 beats with a max rate of 190 bpm, the longest lasting 8 mins 14 secs with an avg rate of 160 bpm. Supraventricular Tachycardia was detected within +/- 45 seconds of symptomatic patient event(s).  Atrial Flutter occurred (<1% burden), ranging from 109-143 bpm (avg of 124 bpm), the longest lasting 13 mins 25 secs with an avg rate of 124 bpm. Isolated SVEs were occasional (1.6%, 27201), SVE Couplets were rare (<1.0%, 331), and SVE Triplets were rare (<1.0%, 71). Isolated VEs were rare (<1.0%), VE Couplets were rare (<1.0%), and no VE Triplets were present. Signed, Dossie Arbour, MD, Ph.D [facility]  ECHOCARDIOGRAM COMPLETE  Result Date: 10/06/2022    ECHOCARDIOGRAM REPORT   Patient Name:   BRYNLIE DAZA Date of Exam: 10/06/2022 Medical Rec #:  [id number]      Height:       66.0 in Accession #:    [id number]     Weight:       152.8 lb Date of Birth:  1935/07/27       BSA:          1.784 m Patient Age:    86 years       BP:           108/61 mmHg Patient Gender: F              HR:           90 bpm. Exam Location:  [place] Procedure: 2D Echo, Cardiac Doppler, Color Doppler and Strain Analysis Indications:    R00.2 Palpitations  History:        Patient has no prior history of Echocardiogram examinations.                 CHF, COPD, Signs/Symptoms:Murmur and Edema; Risk  Factors:Hypertension and Dyslipidemia.  Sonographer:    Rolland Porter Referring Phys: [id number] Antonieta Iba IMPRESSIONS  1. Left ventricular ejection fraction, by estimation, is 55 to 60%. The left ventricle has normal function. The left ventricle has no regional wall motion abnormalities. Left ventricular diastolic parameters are consistent with Grade II diastolic dysfunction (pseudonormalization). The average left ventricular global longitudinal strain is -17.7 %.  2. Right ventricular systolic function is normal. The right ventricular size is normal. There is mildly elevated pulmonary artery systolic pressure.  3. Left atrial size was severely dilated.  4. The mitral valve is degenerative. Mild to moderate mitral valve regurgitation. Mild to moderate mitral stenosis. The mean mitral valve gradient is 6.0 mmHg. Moderate mitral  annular calcification.  5. Tricuspid valve regurgitation is mild to moderate.  6. The aortic valve is tricuspid. Aortic valve regurgitation is not visualized. Aortic valve sclerosis/calcification is present, without any evidence of aortic stenosis.  7. The inferior vena cava is normal in size with greater than 50% respiratory variability, suggesting right atrial pressure of 3 mmHg. FINDINGS  Left Ventricle: Left ventricular ejection fraction, by estimation, is 55 to 60%. The left ventricle has normal function. The left ventricle has no regional wall motion abnormalities. The average left ventricular global longitudinal strain is -17.7 %. The left ventricular internal cavity size was normal in size. There is no left ventricular hypertrophy. Left ventricular diastolic parameters are consistent with Grade II diastolic dysfunction (pseudonormalization). Right Ventricle: The right ventricular size is normal. No increase in right ventricular wall thickness. Right ventricular systolic function is normal. There is mildly elevated pulmonary artery systolic pressure. The tricuspid regurgitant velocity is 3.13  m/s, and with an assumed right atrial pressure of 3 mmHg, the estimated right ventricular systolic pressure is 42.2 mmHg. Left Atrium: Left atrial size was severely dilated. Right Atrium: Right atrial size was normal in size. Pericardium: There is no evidence of pericardial effusion. Mitral Valve: The mitral valve is degenerative in appearance. Moderate mitral annular calcification. Mild to moderate mitral valve regurgitation. Mild to moderate mitral valve stenosis. MV peak gradient, 12.9 mmHg. The mean mitral valve gradient is 6.0 mmHg. Tricuspid Valve: The tricuspid valve is normal in structure. Tricuspid valve regurgitation is mild to moderate. Aortic Valve: The aortic valve is tricuspid. Aortic valve regurgitation is not visualized. Aortic valve sclerosis/calcification is present, without any evidence of aortic  stenosis. Aortic valve mean gradient measures 6.0 mmHg. Aortic valve peak gradient measures 11.3 mmHg. Aortic valve area, by VTI measures 2.13 cm. Pulmonic Valve: The pulmonic valve was normal in structure. Pulmonic valve regurgitation is mild. Aorta: The aortic root and ascending aorta are structurally normal, with no evidence of dilitation. Venous: The inferior vena cava is normal in size with greater than 50% respiratory variability, suggesting right atrial pressure of 3 mmHg. IAS/Shunts: No atrial level shunt detected by color flow Doppler.  LEFT VENTRICLE PLAX 2D LVIDd:         4.10 cm     Diastology LVIDs:         2.50 cm     LV e' medial:    4.68 cm/s LV PW:         0.80 cm     LV E/e' medial:  37.4 LV IVS:        1.00 cm     LV e' lateral:   5.33 cm/s LVOT diam:     1.90 cm     LV E/e' lateral: 32.8 LV SV:  72 LV SV Index:   40          2D Longitudinal Strain LVOT Area:     2.84 cm    2D Strain GLS Avg:     -17.7 %  LV Volumes (MOD) LV vol d, MOD A2C: 43.8 ml LV vol d, MOD A4C: 69.0 ml LV vol s, MOD A2C: 11.6 ml LV vol s, MOD A4C: 32.0 ml LV SV MOD A2C:     32.2 ml LV SV MOD A4C:     69.0 ml LV SV MOD BP:      34.1 ml RIGHT VENTRICLE RV S prime:     16.30 cm/s TAPSE (M-mode): 2.9 cm LEFT ATRIUM              Index        RIGHT ATRIUM           Index LA Vol (A2C):   74.9 ml  41.99 ml/m  RA Area:     16.60 cm LA Vol (A4C):   115.0 ml 64.48 ml/m  RA Volume:   43.60 ml  24.44 ml/m LA Biplane Vol: 97.0 ml  54.38 ml/m  AORTIC VALVE                     PULMONIC VALVE AV Area (Vmax):    2.60 cm      PV Vmax:          1.04 m/s AV Area (Vmean):   2.49 cm      PV Peak grad:     4.3 mmHg AV Area (VTI):     2.13 cm      PR End Diast Vel: 7.51 msec AV Vmax:           168.00 cm/s AV Vmean:          113.500 cm/s AV VTI:            0.338 m AV Peak Grad:      11.3 mmHg AV Mean Grad:      6.0 mmHg LVOT Vmax:         154.00 cm/s LVOT Vmean:        99.600 cm/s LVOT VTI:          0.254 m LVOT/AV VTI ratio: 0.75   AORTA Ao Root diam: 2.90 cm Ao Asc diam:  3.10 cm MITRAL VALVE                TRICUSPID VALVE MV Area (PHT): 4.19 cm     TR Peak grad:   39.2 mmHg MV Area VTI:   1.65 cm     TR Vmax:        313.00 cm/s MV Peak grad:  12.9 mmHg MV Mean grad:  6.0 mmHg     SHUNTS MV Vmax:       1.79 m/s     Systemic VTI:  0.25 m MV Vmean:      112.2 cm/s   Systemic Diam: 1.90 cm MV Decel Time: 181 msec MV E velocity: 175.00 cm/s MV A velocity: 189.00 cm/s MV E/A ratio:  0.93 Debbe Odea MD Electronically signed by Debbe Odea MD Signature Date/Time: 10/06/2022/4:54:31 PM    Final     Assessment/Plan Congestive heart failure (HCC) Poor cardiac function certainly worsens and exacerbates lower extremity swelling type symptoms.  It sounds like her cardiologist has gotten her heart situation much better which has helped her legs.   Essential hypertension blood pressure control important  in reducing the progression of atherosclerotic disease. On appropriate oral medications.  Lymphedema Symptoms are significantly improved.  Continue current conservative measures.  Follow-up in 1 year.    Festus Barren, MD  11/04/2022 3:21 PM    This note was created with Dragon medical transcription system.  Any errors from dictation are purely unintentional

## 2022-11-04 NOTE — Assessment & Plan Note (Signed)
Symptoms are significantly improved.  Continue current conservative measures.  Follow-up in 1 year.

## 2022-11-05 ENCOUNTER — Other Ambulatory Visit: Payer: Self-pay | Admitting: Cardiovascular Disease

## 2022-11-06 ENCOUNTER — Encounter: Payer: Self-pay | Admitting: Cardiovascular Disease

## 2022-11-07 ENCOUNTER — Telehealth: Payer: Self-pay | Admitting: Cardiovascular Disease

## 2022-11-07 ENCOUNTER — Other Ambulatory Visit: Payer: Self-pay | Admitting: Internal Medicine

## 2022-11-07 DIAGNOSIS — J309 Allergic rhinitis, unspecified: Secondary | ICD-10-CM

## 2022-11-07 MED ORDER — ENTRESTO 24-26 MG PO TABS: 1.0000 | ORAL_TABLET | Freq: Two times a day (BID) | ORAL | 2 refills | Status: DC

## 2022-11-07 NOTE — Telephone Encounter (Signed)
Medication Refill - Medication: fluticasone (FLONASE) 50 MCG/ACT nasal spray [[id number]]   Pt is almost completely out. [facility] told her to call and get a new prescription.   Has the patient contacted their pharmacy? Yes.   (Agent: If no, request that the patient contact the pharmacy for the refill. If patient does not wish to contact the pharmacy document the reason why and proceed with request.) (Agent: If yes, when and what did the pharmacy advise?)  Preferred Pharmacy (with phone number or street name):  [id number] [place], [place] - [address]  [address] [place] [address]  Phone: [phone] Fax: [phone]   Has the patient been seen for an appointment in the last year OR does the patient have an upcoming appointment? Yes.    Agent: Please be advised that RX refills may take up to 3 business days. We ask that you follow-up with your pharmacy.

## 2022-11-07 NOTE — Telephone Encounter (Signed)
*  STAT* If patient is at the pharmacy, call can be transferred to refill team.   1. Which medications need to be refilled? (please list name of each medication and dose if known) ENTRESTO 24-26 MG   2. Which pharmacy/location (including street and city if local pharmacy) is medication to be sent to?  [id number] [place], [place] - [address]    3. Do they need a 30 day or 90 day supply? 30 day supply (Pt stated she's in the donut hole so requested 30)

## 2022-11-07 NOTE — Telephone Encounter (Signed)
Requested Prescriptions   Signed Prescriptions Disp Refills   sacubitril-valsartan (ENTRESTO) 24-26 MG 60 tablet 2    Sig: Take 1 tablet by mouth 2 (two) times daily.    Authorizing Provider: GOLLAN, TIMOTHY J    Ordering User: NEWCOMER MCCLAIN, Nadeem Romanoski L    

## 2022-11-07 NOTE — Telephone Encounter (Signed)
Requested medication (s) are due for refill today: yes  Requested medication (s) are on the active medication list: yes  Last refill:  08/25/21  Future visit scheduled: yes  Notes to clinic:  Unable to refill per protocol, last refill by another provider.      Requested Prescriptions  Pending Prescriptions Disp Refills   fluticasone (FLONASE) 50 MCG/ACT nasal spray 48 g 6    Sig: SHAKE LIQUID AND USE 2 SPRAYS IN EACH NOSTRIL DAILY     Ear, Nose, and Throat: Nasal Preparations - Corticosteroids Passed - 11/07/2022  3:12 PM      Passed - Valid encounter within last 12 months    Recent Outpatient Visits           3 weeks ago Essential hypertension   [name] Margarita Mail, DO   1 month ago Essential hypertension   [name] Margarita Mail, DO       Future Appointments             In 6 days  [name]   In 1 month Gollan, Tollie Pizza, MD [facility]   In 5 months Margarita Mail, DO [facility]   In 9 months Deirdre Evener, MD [facility]

## 2022-11-10 MED ORDER — FLUTICASONE PROPIONATE 50 MCG/ACT NA SUSP: NASAL | 6 refills | Status: DC

## 2022-11-13 ENCOUNTER — Ambulatory Visit (INDEPENDENT_AMBULATORY_CARE_PROVIDER_SITE_OTHER): Payer: PPO

## 2022-11-13 VITALS — BP 118/62 | Ht 66.0 in | Wt 155.1 lb

## 2022-11-13 DIAGNOSIS — Z0000 Encounter for general adult medical examination without abnormal findings: Secondary | ICD-10-CM | POA: Diagnosis not present

## 2022-11-13 NOTE — Progress Notes (Signed)
Subjective:   Teresa Chambers is a 87 y.o. female who presents for Medicare Annual (Subsequent) preventive examination.  Review of Systems    Cardiac Risk Factors include: advanced age (>67men, >11 women);dyslipidemia;hypertension;sedentary lifestyle     Objective:    Today's Vitals   11/13/22 1325  BP: 118/62  Weight: 155 lb 1.6 oz (70.4 kg)  Height: 5\' 6"  (1.676 m)  PainSc: 4    Body mass index is 25.03 kg/m.     11/13/2022    1:39 PM 06/07/2021    2:54 PM 03/09/2018    2:18 PM 03/04/2018   10:52 AM 03/01/2018    3:36 PM 02/22/2018   11:22 AM 02/11/2018    5:31 AM  Advanced Directives  Does Patient Have a Medical Advance Directive? Yes No Yes No No No Yes  Type of [employer] of [place];Living will    Healthcare Power of Attorney  Does patient want to make changes to medical advance directive?   No - Patient declined No - Patient declined No - Patient declined No - Patient declined No - Patient declined  Copy of Healthcare Power of Attorney in Chart?   No - copy requested    No - copy requested  Would patient like information on creating a medical advance directive?  No - Patient declined     No - Patient declined    Current Medications (verified) Outpatient Encounter Medications as of 11/13/2022  Medication Sig   acetaminophen (TYLENOL 8 HOUR ARTHRITIS PAIN) 650 MG CR tablet    albuterol (VENTOLIN HFA) 108 (90 Base) MCG/ACT inhaler Inhale 2 puffs into the lungs every 6 (six) hours as needed for wheezing or shortness of breath.   apixaban (ELIQUIS) 5 MG TABS tablet Take 1 tablet (5 mg total) by mouth 2 (two) times daily.   Ascorbic Acid (VITAMIN C WITH ROSE HIPS) 1000 MG tablet Take 1,000 mg by mouth daily.   Calcium Carbonate-Vitamin D 600-400 MG-UNIT tablet Take 1 tablet by mouth daily.   DULoxetine (CYMBALTA) 60 MG capsule Take 1 capsule (60 mg total) by mouth daily.   ezetimibe (ZETIA) 10 MG tablet TAKE 1 TABLET(10 MG) BY MOUTH DAILY   ferrous  sulfate 325 (65 FE) MG EC tablet Take 325 mg by mouth 3 (three) times daily with meals.   fluticasone (FLONASE) 50 MCG/ACT nasal spray SHAKE LIQUID AND USE 2 SPRAYS IN EACH NOSTRIL DAILY   fluticasone furoate-vilanterol (BREO ELLIPTA) 200-25 MCG/ACT AEPB Inhale 1 puff into the lungs daily.   levothyroxine (SYNTHROID) 50 MCG tablet TAKE 1 TABLET(50 MCG) BY MOUTH DAILY   metoprolol succinate (TOPROL-XL) 25 MG 24 hr tablet Take 1 tablet (25 mg total) by mouth daily.   mupirocin ointment (BACTROBAN) 2 % Apply 1 Application topically daily.   omeprazole (PRILOSEC) 40 MG capsule Take 1 capsule (40 mg total) by mouth daily.   sacubitril-valsartan (ENTRESTO) 24-26 MG Take 1 tablet by mouth 2 (two) times daily.   tiZANidine (ZANAFLEX) 4 MG tablet Take by mouth.   traMADol (ULTRAM) 50 MG tablet    No facility-administered encounter medications on file as of 11/13/2022.    Allergies (verified) Codeine and Lidocaine   History: Past Medical History:  Diagnosis Date   Actinic keratosis    Acute thoracic back pain    Allergy    Anemia    Arthritis    Asthma    Basal cell carcinoma    nose    Chest pain  unspecified   CHF (congestive heart failure) 04/30/2017   Chronic abdominal pain 05/01/2017   unspecified   Congenital heart failure    COPD (chronic obstructive pulmonary disease)    Disease of upper respiratory system 04/30/2017   Dyspnea on exertion    Esophageal reflux disease 04/30/2017   History of bone density study 06/28/2004   Leg swelling    Lumbar arthropathy    Nasopharyngitis 04/30/2017   Neuromuscular disorder    Osteoarthropathy 04/30/2017   Osteoporosis 04/30/2017   Palpitations    Sinusitis, acute 04/30/2017   unspecified   Squamous cell carcinoma of skin 07/28/2017   Right medial knee. Hypertrophic SCCis. Tx: [facility] 07/28/2017   Thyroid disease    Hashimoto disease   Past Surgical History:  Procedure Laterality Date   CATARACT EXTRACTION     07/21/2001 - 07/20/2002    CHOLECYSTECTOMY     07/21/1997 - 07/20/1998   COLON SURGERY     COLONOSCOPY     05/05/2000, 01/17/2000 Adenomatous Polyps     COLONOSCOPY     11/05/2009, 12/23/2004, 07/07/2001   PH Adenomatous Polyps: [facility] 10/2014; recall ltr mailed 09/18/2014 (dw)    ESOPHAGOGASTRODUODENOSCOPY     11/05/2009, 05/05/2000   EYE SURGERY     FLEXIBLE SIGMOIDOSCOPY  11/28/1999   HAND SURGERY  2006   HERNIA REPAIR     HIP ARTHROPLASTY Right 02/11/2018   Procedure: ARTHROPLASTY BIPOLAR HIP (HEMIARTHROPLASTY);  Surgeon: Christena Flake, MD;  Location: [facility] ORS;  Service: Orthopedics;  Laterality: Right;   JOINT REPLACEMENT     LAPAROSCOPIC COLON RESECTION     2001   Family History  Problem Relation Age of Onset   Heart disease Mother    Heart disease Father    Hypertension Son    Hypertension Daughter    Social History   Socioeconomic History   Marital status: Divorced    Spouse name: Not on file   Number of children: 5   Years of education: 15.5   Highest education level: Some college, no degree  Occupational History   Occupation: retired  Tobacco Use   Smoking status: Never   Smokeless tobacco: Never  Vaping Use   Vaping Use: Never used  Substance and Sexual Activity   Alcohol use: Not Currently   Drug use: Never   Sexual activity: Not Currently  Other Topics Concern   Not on file  Social History Narrative   Lives independently, has home at [facility] she enjoys traveling to   Social Determinants of Health   Financial Resource Strain: Low Risk  (11/13/2022)   Overall Financial Resource Strain (CARDIA)    Difficulty of Paying Living Expenses: Not hard at all  Food Insecurity: No Food Insecurity (11/13/2022)   Hunger Vital Sign    Worried About Running Out of Food in the Last Year: Never true    Ran Out of Food in the Last Year: Never true  Transportation Needs: No Transportation Needs (11/13/2022)   PRAPARE - Administrator, Civil Service (Medical): No    Lack of  Transportation (Non-Medical): No  Physical Activity: Insufficiently Active (11/13/2022)   Exercise Vital Sign    Days of Exercise per Week: 7 days    Minutes of Exercise per Session: 20 min  Stress: No Stress Concern Present (11/13/2022)   [employer] of Occupational Health - Occupational Stress Questionnaire    Feeling of Stress : Not at all  Social Connections: Socially Isolated (11/13/2022)   Social Connection and  Isolation Panel [NHANES]    Frequency of Communication with Friends and Family: More than three times a week    Frequency of Social Gatherings with Friends and Family: Once a week    Attends Religious Services: Never    Database administrator or Organizations: No    Attends Engineer, structural: Never    Marital Status: Divorced    Tobacco Counseling Counseling given: Not Answered   Clinical Intake:  Pre-visit preparation completed: Yes  Pain : 0-10 Pain Score: 4  Pain Type: Chronic pain Pain Location: Back (both knees) Pain Orientation: Lower Pain Onset: More than a month ago Pain Frequency: Constant Pain Relieving Factors: Tramadol and ES Tylenol  Pain Relieving Factors: Tramadol and ES Tylenol  BMI - recorded: 25.03 Nutritional Status: BMI 25 -29 Overweight Nutritional Risks: None Diabetes: No  How often do you need to have someone help you when you read instructions, pamphlets, or other written materials from your doctor or pharmacy?: 1 - Never  Diabetic?no  Interpreter Needed?: No  Comments: lives alone Information entered by :: B.Cortlin Marano,LPN   Activities of Daily Living    11/13/2022    1:39 PM 10/16/2022    3:12 PM  In your present state of health, do you have any difficulty performing the following activities:  Hearing? 0 0  Vision? 0 0  Difficulty concentrating or making decisions? 1 1  Walking or climbing stairs? 1 1  Dressing or bathing? 0 0  Doing errands, shopping? 0 0  Preparing Food and eating ? N   Using the  Toilet? N   In the past six months, have you accidently leaked urine? N   Do you have problems with loss of bowel control? N   Managing your Medications? N   Managing your Finances? N   Housekeeping or managing your Housekeeping? N     Patient Care Team: Margarita Mail, DO as PCP - General (Internal Medicine)  Indicate any recent Medical Services you may have received from other than [facility] providers in the past year (date may be approximate).     Assessment:   This is a routine wellness examination for [facility].  Hearing/Vision screen Hearing Screening - Comments:: Adequate hearing;lower tones little trouble Vision Screening - Comments:: Adequate with glasses [facility]  Dietary issues and exercise activities discussed: Current Exercise Habits: Home exercise routine, Time (Minutes): 20, Frequency (Times/Week): 5, Weekly Exercise (Minutes/Week): 100, Intensity: Mild, Exercise limited by: orthopedic condition(s);cardiac condition(s)   Goals Addressed   None    Depression Screen    11/13/2022    1:33 PM 10/16/2022    3:12 PM 09/19/2022   11:05 AM 04/21/2022   11:33 AM 04/21/2022   11:25 AM 12/02/2021   11:12 AM 06/07/2021    2:51 PM  PHQ 2/9 Scores  PHQ - 2 Score 1 0 0 0 0 0 0  PHQ- 9 Score  0 0 0       Fall Risk    11/13/2022    1:29 PM 10/16/2022    3:11 PM 09/19/2022   11:05 AM 04/21/2022   11:25 AM 12/02/2021   11:12 AM  Fall Risk   Falls in the past year? 0 0 0 0 0  Number falls in past yr: 0 0 0 0 0  Injury with Fall? 0 0 0 0 0  Risk for fall due to : No Fall Risks   No Fall Risks No Fall Risks  Follow up Education provided;Falls prevention discussed  Falls evaluation completed Falls evaluation completed    FALL RISK PREVENTION PERTAINING TO THE HOME:  Any stairs in or around the home? No  If so, are there any without handrails? No  Home free of loose throw rugs in walkways, pet beds, electrical cords, etc? Yes  Adequate lighting in your home to reduce risk of  falls? Yes   ASSISTIVE DEVICES UTILIZED TO PREVENT FALLS:  Life alert? No  Use of a cane, walker or w/c? Yes  Grab bars in the bathroom? Yes  Shower chair or bench in shower? Yes  Elevated toilet seat or a handicapped toilet? Yes    TIMED UP AND GO:  Was the test performed? Yes .  Length of time to ambulate 10 feet: 10 sec.   Gait slow and steady with assistive device  Cognitive Function:    06/07/2021    2:55 PM  MMSE - Mini Mental State Exam  Not completed: Unable to complete        06/07/2021    2:55 PM  6CIT Screen  What Year? 0 points  What month? 0 points  What time? 0 points  Count back from 20 0 points  Months in reverse 0 points  Repeat phrase 0 points  Total Score 0 points    Immunizations Immunization History  Administered Date(s) Administered   Fluad Quad(high Dose 65+) 04/03/2021, 04/21/2022   Influenza Split 03/29/2019, 04/13/2019   Influenza-Unspecified 04/08/2018   PFIZER(Purple Top)SARS-COV-2 Vaccination 08/15/2019, 09/05/2019, 05/10/2020   PNEUMOCOCCAL CONJUGATE-20 09/19/2022   PPD Test 02/13/2018   Pneumococcal Conjugate-13 07/01/2013, 12/06/2019   Pneumococcal Polysaccharide-23 04/20/2015, 10/28/2019   Tdap 04/03/2021    TDAP status: Up to date  Flu Vaccine status: Up to date  Pneumococcal vaccine status: Up to date  Covid-19 vaccine status: Completed vaccines  Qualifies for Shingles Vaccine? Yes   Zostavax completed Yes   Shingrix Completed?: Yes  Screening Tests Health Maintenance  Topic Date Due   COVID-19 Vaccine (4 - 2023-24 season) 03/21/2022   Zoster Vaccines- Shingrix (1 of 2) 12/20/2022 (Originally 03/28/1955)   INFLUENZA VACCINE  02/19/2023   Medicare Annual Wellness (AWV)  11/13/2023   DTaP/Tdap/Td (2 - Td or Tdap) 04/04/2031   Pneumonia Vaccine 9+ Years old  Completed   DEXA SCAN  Completed   HPV VACCINES  Aged Out    Health Maintenance  Health Maintenance Due  Topic Date Due   COVID-19 Vaccine (4 -  2023-24 season) 03/21/2022    Colorectal cancer screening: No longer required.   Mammogram status: No longer required due to age.  Bone Density status: Completed yes. Results reflect: Bone density results: OSTEOPOROSIS. Repeat every 5 years.  Lung Cancer Screening: (Low Dose CT Chest recommended if Age 59-80 years, 30 pack-year currently smoking OR have quit w/in 15years.) does not qualify.   Lung Cancer Screening Referral: no  Additional Screening:  Hepatitis C Screening: does not qualify; Completed no  Vision Screening: Recommended annual ophthalmology exams for early detection of glaucoma and other disorders of the eye. Is the patient up to date with their annual eye exam?  Yes  Who is the provider or what is the name of the office in which the patient attends annual eye exams? Dr Dellie Burns If pt is not established with a provider, would they like to be referred to a provider to establish care? No .   Dental Screening: Recommended annual dental exams for proper oral hygiene  Community Resource Referral / Chronic Care Management: CRR required this  visit?  No   CCM required this visit?  No      Plan:     I have personally reviewed and noted the following in the patient's chart:   Medical and social history Use of alcohol, tobacco or illicit drugs  Current medications and supplements including opioid prescriptions. Patient is not currently taking opioid prescriptions. Functional ability and status Nutritional status Physical activity Advanced directives List of other physicians Hospitalizations, surgeries, and ER visits in previous 12 months Vitals Screenings to include cognitive, depression, and falls Referrals and appointments  In addition, I have reviewed and discussed with patient certain preventive protocols, quality metrics, and best practice recommendations. A written personalized care plan for preventive services as well as general preventive health  recommendations were provided to patient.     Sue Lush, LPN   6/96/2952   Nurse Notes: The patient states she is doing well and has no concerns or questions at this time. She desires to do visit over the phone next year as she has a hard time "getting moving".

## 2022-11-13 NOTE — Patient Instructions (Signed)
Teresa Chambers , Thank you for taking time to come for your Medicare Wellness Visit. I appreciate your ongoing commitment to your health goals. Please review the following plan we discussed and let me know if I can assist you in the future.   These are the goals we discussed:  Goals   None     This is a list of the screening recommended for you and due dates:  Health Maintenance  Topic Date Due   COVID-19 Vaccine (4 - 2023-24 season) 03/21/2022   Zoster (Shingles) Vaccine (1 of 2) 12/20/2022*   Flu Shot  02/19/2023   Medicare Annual Wellness Visit  11/13/2023   DTaP/Tdap/Td vaccine (2 - Td or Tdap) 04/04/2031   Pneumonia Vaccine  Completed   DEXA scan (bone density measurement)  Completed   HPV Vaccine  Aged Out  *Topic was postponed. The date shown is not the original due date.    Advanced directives: yes  Conditions/risks identified: moderate falls risk  Next appointment: Follow up in one year for your annual wellness visit 11/19/2023 :30pm telephone   Preventive Care 65 Years and Older, Female Preventive care refers to lifestyle choices and visits with your health care provider that can promote health and wellness. What does preventive care include? A yearly physical exam. This is also called an annual well check. Dental exams once or twice a year. Routine eye exams. Ask your health care provider how often you should have your eyes checked. Personal lifestyle choices, including: Daily care of your teeth and gums. Regular physical activity. Eating a healthy diet. Avoiding tobacco and drug use. Limiting alcohol use. Practicing safe sex. Taking low-dose aspirin every day. Taking vitamin and mineral supplements as recommended by your health care provider. What happens during an annual well check? The services and screenings done by your health care provider during your annual well check will depend on your age, overall health, lifestyle risk factors, and family history of  disease. Counseling  Your health care provider may ask you questions about your: Alcohol use. Tobacco use. Drug use. Emotional well-being. Home and relationship well-being. Sexual activity. Eating habits. History of falls. Memory and ability to understand (cognition). Work and work Astronomer. Reproductive health. Screening  You may have the following tests or measurements: Height, weight, and BMI. Blood pressure. Lipid and cholesterol levels. These may be checked every 5 years, or more frequently if you are over 39 years old. Skin check. Lung cancer screening. You may have this screening every year starting at age 70 if you have a 30-pack-year history of smoking and currently smoke or have quit within the past 15 years. Fecal occult blood test (FOBT) of the stool. You may have this test every year starting at age 87. Flexible sigmoidoscopy or colonoscopy. You may have a sigmoidoscopy every 5 years or a colonoscopy every 10 years starting at age 83. Hepatitis C blood test. Hepatitis B blood test. Sexually transmitted disease (STD) testing. Diabetes screening. This is done by checking your blood sugar (glucose) after you have not eaten for a while (fasting). You may have this done every 1-3 years. Bone density scan. This is done to screen for osteoporosis. You may have this done starting at age 94. Mammogram. This may be done every 1-2 years. Talk to your health care provider about how often you should have regular mammograms. Talk with your health care provider about your test results, treatment options, and if necessary, the need for more tests. Vaccines  Your health care  provider may recommend certain vaccines, such as: Influenza vaccine. This is recommended every year. Tetanus, diphtheria, and acellular pertussis (Tdap, Td) vaccine. You may need a Td booster every 10 years. Zoster vaccine. You may need this after age 15. Pneumococcal 13-valent conjugate (PCV13) vaccine. One  dose is recommended after age 33. Pneumococcal polysaccharide (PPSV23) vaccine. One dose is recommended after age 43. Talk to your health care provider about which screenings and vaccines you need and how often you need them. This information is not intended to replace advice given to you by your health care provider. Make sure you discuss any questions you have with your health care provider. Document Released: 08/03/2015 Document Revised: 03/26/2016 Document Reviewed: 05/08/2015 Elsevier Interactive Patient Education  2017 [employer].  Fall Prevention in the Home Falls can cause injuries. They can happen to people of all ages. There are many things you can do to make your home safe and to help prevent falls. What can I do on the outside of my home? Regularly fix the edges of walkways and driveways and fix any cracks. Remove anything that might make you trip as you walk through a door, such as a raised step or threshold. Trim any bushes or trees on the path to your home. Use bright outdoor lighting. Clear any walking paths of anything that might make someone trip, such as rocks or tools. Regularly check to see if handrails are loose or broken. Make sure that both sides of any steps have handrails. Any raised decks and porches should have guardrails on the edges. Have any leaves, snow, or ice cleared regularly. Use sand or salt on walking paths during winter. Clean up any spills in your garage right away. This includes oil or grease spills. What can I do in the bathroom? Use night lights. Install grab bars by the toilet and in the tub and shower. Do not use towel bars as grab bars. Use non-skid mats or decals in the tub or shower. If you need to sit down in the shower, use a plastic, non-slip stool. Keep the floor dry. Clean up any water that spills on the floor as soon as it happens. Remove soap buildup in the tub or shower regularly. Attach bath mats securely with double-sided  non-slip rug tape. Do not have throw rugs and other things on the floor that can make you trip. What can I do in the bedroom? Use night lights. Make sure that you have a light by your bed that is easy to reach. Do not use any sheets or blankets that are too big for your bed. They should not hang down onto the floor. Have a firm chair that has side arms. You can use this for support while you get dressed. Do not have throw rugs and other things on the floor that can make you trip. What can I do in the kitchen? Clean up any spills right away. Avoid walking on wet floors. Keep items that you use a lot in easy-to-reach places. If you need to reach something above you, use a strong step stool that has a grab bar. Keep electrical cords out of the way. Do not use floor polish or wax that makes floors slippery. If you must use wax, use non-skid floor wax. Do not have throw rugs and other things on the floor that can make you trip. What can I do with my stairs? Do not leave any items on the stairs. Make sure that there are handrails on both  sides of the stairs and use them. Fix handrails that are broken or loose. Make sure that handrails are as long as the stairways. Check any carpeting to make sure that it is firmly attached to the stairs. Fix any carpet that is loose or worn. Avoid having throw rugs at the top or bottom of the stairs. If you do have throw rugs, attach them to the floor with carpet tape. Make sure that you have a light switch at the top of the stairs and the bottom of the stairs. If you do not have them, ask someone to add them for you. What else can I do to help prevent falls? Wear shoes that: Do not have high heels. Have rubber bottoms. Are comfortable and fit you well. Are closed at the toe. Do not wear sandals. If you use a stepladder: Make sure that it is fully opened. Do not climb a closed stepladder. Make sure that both sides of the stepladder are locked into place. Ask  someone to hold it for you, if possible. Clearly mark and make sure that you can see: Any grab bars or handrails. First and last steps. Where the edge of each step is. Use tools that help you move around (mobility aids) if they are needed. These include: Canes. Walkers. Scooters. Crutches. Turn on the lights when you go into a dark area. Replace any light bulbs as soon as they burn out. Set up your furniture so you have a clear path. Avoid moving your furniture around. If any of your floors are uneven, fix them. If there are any pets around you, be aware of where they are. Review your medicines with your doctor. Some medicines can make you feel dizzy. This can increase your chance of falling. Ask your doctor what other things that you can do to help prevent falls. This information is not intended to replace advice given to you by your health care provider. Make sure you discuss any questions you have with your health care provider. Document Released: 05/03/2009 Document Revised: 12/13/2015 Document Reviewed: 08/11/2014 Elsevier Interactive Patient Education  2017 [employer].

## 2022-12-18 ENCOUNTER — Other Ambulatory Visit: Payer: Self-pay | Admitting: Internal Medicine

## 2022-12-18 DIAGNOSIS — M47816 Spondylosis without myelopathy or radiculopathy, lumbar region: Secondary | ICD-10-CM

## 2022-12-18 NOTE — Telephone Encounter (Signed)
Requested Prescriptions  Pending Prescriptions Disp Refills   DULoxetine (CYMBALTA) 60 MG capsule [Pharmacy Med Name: DULOXETINE HCL DR 60 MG CAP] 90 capsule 1    Sig: TAKE 1 CAPSULE BY MOUTH EVERY DAY     Psychiatry: Antidepressants - SNRI - duloxetine Passed - 12/18/2022  2:29 AM      Passed - Cr in normal range and within 360 days    Creat  Date Value Ref Range Status  03/31/2022 0.80 0.60 - 0.95 mg/dL Final         Passed - eGFR is 30 or above and within 360 days    GFR, Est African American  Date Value Ref Range Status  06/04/2020 96 > OR = 60 mL/min/1.60m2 Final   GFR, Est Non African American  Date Value Ref Range Status  06/04/2020 83 > OR = 60 mL/min/1.50m2 Final   eGFR  Date Value Ref Range Status  03/31/2022 73 > OR = 60 mL/min/1.52m2 Final         Passed - Completed PHQ-2 or PHQ-9 in the last 360 days      Passed - Last BP in normal range    BP Readings from Last 1 Encounters:  11/13/22 118/62         Passed - Valid encounter within last 6 months    Recent Outpatient Visits           2 months ago Essential hypertension   [name] Margarita Mail, DO   3 months ago Essential hypertension   [facility] Margarita Mail, DO       Future Appointments             In 5 days Gollan, Tollie Pizza, MD [facility] [place]   In 4 months Margarita Mail, DO [facility]   In 7 months Deirdre Evener, MD [facility]

## 2022-12-23 ENCOUNTER — Encounter: Payer: Self-pay | Admitting: Cardiovascular Disease

## 2022-12-23 ENCOUNTER — Ambulatory Visit: Payer: PPO | Attending: Cardiovascular Disease | Admitting: Cardiovascular Disease

## 2022-12-23 VITALS — BP 100/60 | HR 82 | Ht 66.0 in | Wt 155.0 lb

## 2022-12-23 DIAGNOSIS — I872 Venous insufficiency (chronic) (peripheral): Secondary | ICD-10-CM

## 2022-12-23 DIAGNOSIS — R002 Palpitations: Secondary | ICD-10-CM

## 2022-12-23 DIAGNOSIS — I10 Essential (primary) hypertension: Secondary | ICD-10-CM

## 2022-12-23 DIAGNOSIS — J449 Chronic obstructive pulmonary disease, unspecified: Secondary | ICD-10-CM | POA: Diagnosis not present

## 2022-12-23 DIAGNOSIS — I5022 Chronic systolic (congestive) heart failure: Secondary | ICD-10-CM | POA: Diagnosis not present

## 2022-12-23 DIAGNOSIS — E785 Hyperlipidemia, unspecified: Secondary | ICD-10-CM

## 2022-12-23 NOTE — Patient Instructions (Addendum)
Medication Instructions:  No changes  If you need a refill on your cardiac medications before your next appointment, please call your pharmacy.    Lab work: No new labs needed   Testing/Procedures: No new testing needed   Follow-Up: At [facility], you and your health needs are our priority.  As part of our continuing mission to provide you with exceptional heart care, we have created designated Provider Care Teams.  These Care Teams include your primary Cardiologist (physician) and Advanced Practice Providers (APPs -  Physician Assistants and Nurse Practitioners) who all work together to provide you with the care you need, when you need it.  You will need a follow up appointment in 6 months  Providers on your designated Care Team:   Christopher Berge, NP Ryan Dunn, PA-C Cadence Furth, PA-C  COVID-19 Vaccine Information can be found at: [email or web address] For questions related to vaccine distribution or appointments, please email [email or web address] or call [phone].   

## 2022-12-23 NOTE — Progress Notes (Signed)
Cardiology Office Note  Date:  12/23/2022   ID:  ZINA FERNALD, DOB [birth year], MRN [id number]  PCP:  Margarita Mail, DO   Chief Complaint  Patient presents with   3 month follow up     Follow up Zio monitor & Echo results. Patient c/o shortness of breath and bilateral LE edema. Medications reviewed by the patient verbally.     HPI:  [name] is a 87 year old woman with past medical history of Edema Hypertension COPD/asthma Hyperlipidemia Records indicating chronic systolic CHF but no details available Who presents for follow-up of her chronic systolic CHF, palpitations  Last seen by myself in clinic February 2024  ZIO monitor March 2024 Short episodes of SVT Less than 1% atrial flutter, longest was 13 minutes  Echocardiogram October 06, 2022 EF 55 to 60% Mildly elevated right heart pressures, mild to moderate MS  Trauma right leg from dog, large hematoma shin,  Swelling slowly going down  Knee pain on left  EKG personally reviewed by myself on todays visit Normal sinus rhythm rate 82 bpm no significant ST-T wave changes   Other past medical history reviewed Holter monitor done through Dr. Juel Burrow following which she was started on Eliquis 2-day Holter reviewed by myself, no clear indication of atrial fibrillation or flutter noted Reports she continues to have Paroxysmal tachycardia, rate up to 180 at home Less than 5 min Sometimes >20 min  "Told I had two heart attacks" [facility] July 2019 hospitalized with PNA,  Reports feet are chronically cold Normal ABIs with vascular October 2023 Minimal leg swelling  Holter: NSR, PVCs  PMH:   has a past medical history of Actinic keratosis, Acute thoracic back pain, Allergy, Anemia, Arthritis, Asthma, Basal cell carcinoma, Chest pain, CHF (congestive heart failure) (HCC) (04/30/2017), Chronic abdominal pain (05/01/2017), Congenital heart failure (HCC), COPD (chronic obstructive pulmonary disease) (HCC), Disease of upper  respiratory system (04/30/2017), Dyspnea on exertion, Esophageal reflux disease (04/30/2017), History of bone density study (06/28/2004), Leg swelling, Lumbar arthropathy, Nasopharyngitis (04/30/2017), Neuromuscular disorder (HCC), Osteoarthropathy (04/30/2017), Osteoporosis (04/30/2017), Palpitations, Sinusitis, acute (04/30/2017), Squamous cell carcinoma of skin (07/28/2017), and Thyroid disease.  PSH:    Past Surgical History:  Procedure Laterality Date   CATARACT EXTRACTION     07/21/2001 - 07/20/2002   CHOLECYSTECTOMY     07/21/1997 - 07/20/1998   COLON SURGERY     COLONOSCOPY     05/05/2000, 01/17/2000 Adenomatous Polyps     COLONOSCOPY     11/05/2009, 12/23/2004, 07/07/2001   PH Adenomatous Polyps: CBF 10/2014; recall ltr mailed 09/18/2014 (dw)    ESOPHAGOGASTRODUODENOSCOPY     11/05/2009, 05/05/2000   EYE SURGERY     FLEXIBLE SIGMOIDOSCOPY  11/28/1999   HAND SURGERY  2006   HERNIA REPAIR     HIP ARTHROPLASTY Right 02/11/2018   Procedure: ARTHROPLASTY BIPOLAR HIP (HEMIARTHROPLASTY);  Surgeon: Christena Flake, MD;  Location: [facility] ORS;  Service: Orthopedics;  Laterality: Right;   JOINT REPLACEMENT     LAPAROSCOPIC COLON RESECTION     2001    Current Outpatient Medications  Medication Sig Dispense Refill   acetaminophen (TYLENOL 8 HOUR ARTHRITIS PAIN) 650 MG CR tablet      albuterol (VENTOLIN HFA) 108 (90 Base) MCG/ACT inhaler Inhale 2 puffs into the lungs every 6 (six) hours as needed for wheezing or shortness of breath. 8 g 2   apixaban (ELIQUIS) 5 MG TABS tablet Take 1 tablet (5 mg total) by mouth 2 (two) times daily. 60 tablet 6  Ascorbic Acid (VITAMIN C WITH ROSE HIPS) 1000 MG tablet Take 1,000 mg by mouth daily.     Calcium Carbonate-Vitamin D 600-400 MG-UNIT tablet Take 1 tablet by mouth daily.     DULoxetine (CYMBALTA) 60 MG capsule TAKE 1 CAPSULE BY MOUTH EVERY DAY 90 capsule 1   ezetimibe (ZETIA) 10 MG tablet TAKE 1 TABLET(10 MG) BY MOUTH DAILY 90 tablet 3   ferrous sulfate  325 (65 FE) MG EC tablet Take 325 mg by mouth 3 (three) times daily with meals.     fluticasone (FLONASE) 50 MCG/ACT nasal spray SHAKE LIQUID AND USE 2 SPRAYS IN EACH NOSTRIL DAILY 48 g 6   fluticasone furoate-vilanterol (BREO ELLIPTA) 200-25 MCG/ACT AEPB Inhale 1 puff into the lungs daily. 28 each 4   levothyroxine (SYNTHROID) 50 MCG tablet TAKE 1 TABLET(50 MCG) BY MOUTH DAILY 90 tablet 3   metoprolol succinate (TOPROL-XL) 25 MG 24 hr tablet Take 1 tablet (25 mg total) by mouth daily. 90 tablet 3   mupirocin ointment (BACTROBAN) 2 % Apply 1 Application topically daily. 22 g 0   omeprazole (PRILOSEC) 40 MG capsule Take 1 capsule (40 mg total) by mouth daily. 90 capsule 1   sacubitril-valsartan (ENTRESTO) 24-26 MG Take 1 tablet by mouth 2 (two) times daily. 60 tablet 2   tiZANidine (ZANAFLEX) 4 MG tablet Take by mouth.     traMADol (ULTRAM) 50 MG tablet      No current facility-administered medications for this visit.    Allergies:   Codeine and Lidocaine   Social History:  The patient  reports that she has never smoked. She has never used smokeless tobacco. She reports that she does not currently use alcohol. She reports that she does not use drugs.   Family History:   family history includes Heart disease in her father and mother; Hypertension in her daughter and son.    Review of Systems: Review of Systems  Constitutional: Negative.   HENT: Negative.    Respiratory: Negative.    Cardiovascular: Negative.   Gastrointestinal: Negative.   Musculoskeletal: Negative.        Cold feet  Neurological: Negative.   Psychiatric/Behavioral: Negative.    All other systems reviewed and are negative.   PHYSICAL EXAM: VS:  BP 100/60 (BP Location: Left Arm, Patient Position: Sitting, Cuff Size: Normal)   Pulse 82   Ht 5\' 6"  (1.676 m)   Wt 155 lb (70.3 kg)   SpO2 96%   BMI 25.02 kg/m  , BMI Body mass index is 25.02 kg/m. Constitutional:  oriented to person, place, and time. No distress.   HENT:  Head: Grossly normal Eyes:  no discharge. No scleral icterus.  Neck: No JVD, no carotid bruits  Cardiovascular: Regular rate and rhythm, no murmurs appreciated Pulmonary/Chest: Clear to auscultation bilaterally, no wheezes or rails Abdominal: Soft.  no distension.  no tenderness.  Musculoskeletal: Normal range of motion Neurological:  normal muscle tone. Coordination normal. No atrophy Skin: Skin warm and dry Psychiatric: normal affect, pleasant  Recent Labs: 03/31/2022: ALT 14; BUN 16; Creat 0.80; Hemoglobin 11.7; Platelets 386; Potassium 3.6; Sodium 145; TSH 1.76    Lipid Panel Lab Results  Component Value Date   CHOL 196 06/04/2021   HDL 70 06/04/2021   LDLCALC 107 (H) 06/04/2021   TRIG 97 06/04/2021    Wt Readings from Last 3 Encounters:  12/23/22 155 lb (70.3 kg)  11/13/22 155 lb 1.6 oz (70.4 kg)  11/04/22 155 lb (70.3 kg)  ASSESSMENT AND PLAN:  Problem List Items Addressed This Visit       Cardiology Problems   Chronic venous insufficiency   Essential hypertension   Relevant Orders   EKG 12-Lead   Chronic systolic (congestive) heart failure (HCC) - Primary   Relevant Orders   EKG 12-Lead     Other   Chronic obstructive pulmonary disease (HCC)   Relevant Orders   EKG 12-Lead   Dyslipidemia   Other Visit Diagnoses     Palpitations       Relevant Orders   EKG 12-Lead      COPD/asthma On inhalers/Breo Reports breathing is stable  Lower extremity swelling Leg swelling from recent trauma right leg, dog scratch and hematoma Slowly improving, leg recommended  Paroxysmal atrial fibrillation/flutter Zio monitor showing low burden atrial flutter Will continue metoprolol with Eliquis 5 twice daily Does not meet criteria for reduced dose Eliquis   Total encounter time more than 30 minutes  Greater than 50% was spent in counseling and coordination of care with the patient    Signed, Dossie Arbour, M.D., Ph.D. [facility]  [place], [place] [phone]

## 2023-01-05 ENCOUNTER — Encounter: Payer: Self-pay | Admitting: Cardiovascular Disease

## 2023-01-06 ENCOUNTER — Ambulatory Visit: Payer: Self-pay

## 2023-01-06 NOTE — Telephone Encounter (Signed)
  Chief Complaint: Bleeding hasn't stopped since injury Symptoms:  Frequency: Sunday Pertinent Negatives: Patient denies  Disposition: [x] ED /[] Urgent Care (no appt availability in office) / [] Appointment(In office/virtual)/ []  [facility]/ [] Home Care/ [] Refused Recommended Disposition /[] [facility]/ []  Follow-up with PCP Additional Notes:Spoke with daughter, Gunnar Fusi, Pt fell on Sunday and hurt her arm. Pt has good size wound on forearm. Wound has not stopped bleeding. Per daughter pt is losing more than 1 cup of blood a day. Pt will return to ED for care.    Reason for Disposition  [1] Bleeding AND [2] won't stop after 10 minutes of direct pressure (using correct technique)  Answer Assessment - Initial Assessment Questions 1. APPEARANCE of INJURY: "What does the injury look like?"      Large open wound on forearm  3. BLEEDING: "Is it bleeding now?" If Yes, ask: "Is it difficult to stop?"      Yes - not stopped since injury 4. LOCATION: "Where is the injury located?"      forearm 5. ONSET: "How long ago did the injury occur?"      Sunday 6. MECHANISM: "Tell me how it happened."      fell  Protocols used: Skin Injury-A-AH

## 2023-01-07 ENCOUNTER — Other Ambulatory Visit: Payer: Self-pay | Admitting: Cardiovascular Disease

## 2023-01-13 ENCOUNTER — Encounter: Payer: Self-pay | Admitting: Internal Medicine

## 2023-01-13 NOTE — Telephone Encounter (Unsigned)
Copied from CRM [id number]. Topic: General - Other >> Jan 13, 2023 11:51 AM Franchot Heidelberg wrote: Oralia Manis NP from [facility] is in need of wound care (which is not provided here)   Best contact: [phone]

## 2023-01-15 ENCOUNTER — Other Ambulatory Visit: Payer: Self-pay

## 2023-01-15 ENCOUNTER — Encounter: Payer: Self-pay | Admitting: Nurse Practitioner

## 2023-01-15 ENCOUNTER — Ambulatory Visit (INDEPENDENT_AMBULATORY_CARE_PROVIDER_SITE_OTHER): Payer: PPO | Admitting: Nurse Practitioner

## 2023-01-15 VITALS — BP 120/72 | HR 82 | Temp 98.1°F | Resp 16 | Ht 66.0 in | Wt 152.2 lb

## 2023-01-15 DIAGNOSIS — D649 Anemia, unspecified: Secondary | ICD-10-CM | POA: Diagnosis not present

## 2023-01-15 DIAGNOSIS — W19XXXD Unspecified fall, subsequent encounter: Secondary | ICD-10-CM

## 2023-01-15 DIAGNOSIS — S51812S Laceration without foreign body of left forearm, sequela: Secondary | ICD-10-CM

## 2023-01-15 NOTE — Progress Notes (Signed)
BP 120/72   Pulse 82   Temp 98.1 F (36.7 C) (Oral)   Resp 16   Ht 5\' 6"  (1.676 m)   Wt 152 lb 3.2 oz (69 kg)   SpO2 97%   BMI 24.57 kg/m    Subjective:    Patient ID: Teresa Chambers, female    DOB: [birth year], 87 y.o.   MRN: [id number]  HPI: Teresa Chambers is a 87 y.o. female  Chief Complaint  Patient presents with   Follow-up    ER at [facility]. Left arm injury after fall   Er follow up/ fall/bleeding skin tear left arm: patient was seen in er on 01/04/2023 and again on 01/06/2023.  Er note from 01/04/2023: Pt fell in home and thinks she hit her left arm on chair. Left arm has large skin tears up forearm. Pressure dressing applied in triage. Pt is on blood thinners. Denies LOC but says she hit back of her head on the floor. Complaining of pain in tailbone as well   87 year old female presents to the ED following a fall that occurred this afternoon. Patient is currently visiting from out of town. Patient states that she tripped over her feet and fell on her tailbone, fell back and hit the back of her head. She states that she also hit her left arm as she was falling, reports bleeding from the left arm. Patient is on Eliquis. She reports a history of osteoporosis, states that she has broken her pelvis after a fall. She states that she is an unsteady when walking, uses a walker or cane at home. She denies loss of consciousness, blurred vision, nausea, vomiting.  Patient was discharged. Patient then returned to er on 01/06/2023 because her arm continued to bleed.  Er note from 01/06/2023: Patient here with bleeding to left arm that has been ongoing since Sunday after a fall and injury to that arm. Patient on eliquis. Patient states she's starting to feel "a little faint" and weak now.   Patient is an 87 year old female comes in with complaints of bleeding noted to her left arm. She fell on Sunday and had an injury to her arm and it has continued to ooze and bleed since that time.  Family is changing bandages frequently. Patient takes Eliquis daily. She called her cardiologist and was cleared to stop the Eliquis for 4 days. She denies any interval injury. Family has tried over-the-counter clot medications with some improvement.  87 year old female with persistent bleeding following a fall on Sunday. She did have a single vessel that continued to ooze. Bleeding was controlled with pinpoint pressure. We held pressure on the area applied Surgicel and continue to apply direct pressure to the single vessel. Hemostasis was achieved using Surgicel and direct pressure. Area was observed with no persistent bleeding. Bandage was redressed and she will follow-up as an outpatient. She was noted to be mildly anemic today with a hemoglobin of 9.6 and hematocrit of 30. When reviewing past medical records I do not see a recent hemoglobin hematocrit for comparison. She is encouraged to follow-up with her primary care provider and cardiologist in [place]. Bleeding has been controlled and patient is discharged in stable condition  Patient reports: she has not had anymore bleeding. She says that it is doing better.  She reports that  her children were changing her dressing.  2-3 times day when it was bleeding. Bandaged was last changed on Tuesday.  She says that [facility] changed it but requested  patient get wound care.  Referral placed.  Will also do a dressing change in office, image of wound in chart.    Relevant past medical, surgical, family and social history reviewed and updated as indicated. Interim medical history since our last visit reviewed. Allergies and medications reviewed and updated.  Review of Systems  Constitutional: Negative for fever or weight change.  Respiratory: Negative for cough and shortness of breath.   Cardiovascular: Negative for chest pain or palpitations.  Gastrointestinal: Negative for abdominal pain, no bowel changes.  Musculoskeletal: Negative for gait problem or  joint swelling.  Skin: Negative for rash. Positive for wound Neurological: Negative for dizziness or headache.  No other specific complaints in a complete review of systems (except as listed in HPI above).      Objective:    BP 120/72   Pulse 82   Temp 98.1 F (36.7 C) (Oral)   Resp 16   Ht 5\' 6"  (1.676 m)   Wt 152 lb 3.2 oz (69 kg)   SpO2 97%   BMI 24.57 kg/m   Wt Readings from Last 3 Encounters:  01/15/23 152 lb 3.2 oz (69 kg)  12/23/22 155 lb (70.3 kg)  11/13/22 155 lb 1.6 oz (70.4 kg)    Physical Exam  Constitutional: Patient appears well-developed and well-nourished.  No distress.  HEENT: head atraumatic, normocephalic, pupils equal and reactive to light,, neck supple, throat within normal limits Cardiovascular: Normal rate, regular rhythm and normal heart sounds.  No murmur heard. No BLE edema. Pulmonary/Chest: Effort normal and breath sounds normal. No respiratory distress. Abdominal: Soft.  There is no tenderness. Skin: left forearm wound, image in media,  still seeping a little blood, no signs of infection Psychiatric: Patient has a normal mood and affect. behavior is normal. Judgment and thought content normal.  Results for orders placed or performed in visit on 10/06/22  ECHOCARDIOGRAM COMPLETE  Result Value Ref Range   AR max vel 2.60 cm2   AV Peak grad 11.3 mmHg   Ao pk vel 1.68 m/s   S' Lateral 2.50 cm   Area-P 1/2 4.19 cm2   AV Area VTI 2.13 cm2   AV Mean grad 6.0 mmHg   Single Plane A4C EF 53.6 %   Single Plane A2C EF 73.5 %   Calc EF 61.5 %   AV Area mean vel 2.49 cm2   MV VTI 1.65 cm2   Est EF 55 - 60%       Assessment & Plan:   Problem List Items Addressed This Visit   None Visit Diagnoses     Fall, subsequent encounter    -  Primary   doing better, referral placed for wound care,  check cbc for anemia in er   Relevant Orders   Ambulatory referral to Home Health   Skin tear of forearm without complication, left, sequela       doing  better, referral placed for wound care, check cbc for anemia in er   Relevant Orders   Ambulatory referral to Home Health   Anemia, unspecified type       check cbc   Relevant Orders   CBC with Differential/Platelet        Follow up plan: Return if symptoms worsen or fail to improve.

## 2023-01-16 ENCOUNTER — Telehealth: Payer: Self-pay | Admitting: Internal Medicine

## 2023-01-16 NOTE — Telephone Encounter (Unsigned)
Copied from CRM #[id number]. Topic: Referral - Status >> Jan 16, 2023  1:43 PM Carrielelia G wrote: Referral for: [facility]..  Ms Shofner would like a call back regarding the status of this referral.  They are wanting to start this coming Sunday.  Please call today. Thank you

## 2023-01-19 NOTE — Telephone Encounter (Signed)
Pt stated [facility] showed up to her home on Sunday, dressed her arm, and advised her they would be back Tuesday. However, her insurance has no record of this and is requesting an order from her PCP.  They ask that this be expedited, as her arm needs to be dressed every day.  Stated is also having a difficult time walking due to her tailbone, and she was told by Frances Furbish she needs physical therapy.  Please advise.

## 2023-01-19 NOTE — Telephone Encounter (Signed)
Referral looks like already placed and they  have already came out?

## 2023-01-23 ENCOUNTER — Encounter: Payer: Self-pay | Admitting: Internal Medicine

## 2023-01-23 ENCOUNTER — Ambulatory Visit (INDEPENDENT_AMBULATORY_CARE_PROVIDER_SITE_OTHER): Payer: PPO | Admitting: Internal Medicine

## 2023-01-23 ENCOUNTER — Ambulatory Visit: Payer: Self-pay

## 2023-01-23 VITALS — BP 122/68 | HR 113 | Temp 97.8°F | Resp 18 | Ht 66.0 in | Wt 152.0 lb

## 2023-01-23 DIAGNOSIS — D649 Anemia, unspecified: Secondary | ICD-10-CM | POA: Diagnosis not present

## 2023-01-23 DIAGNOSIS — S51812S Laceration without foreign body of left forearm, sequela: Secondary | ICD-10-CM | POA: Diagnosis not present

## 2023-01-23 LAB — CBC WITH DIFFERENTIAL/PLATELET
Absolute Monocytes: 755 cells/uL (ref 200–950)
Basophils Absolute: 66 cells/uL (ref 0–200)
Basophils Relative: 0.8 %
Eosinophils Absolute: 257 cells/uL (ref 15–500)
Eosinophils Relative: 3.1 %
HCT: 29 % — ABNORMAL LOW (ref 35.0–45.0)
Hemoglobin: 9.4 g/dL — ABNORMAL LOW (ref 11.7–15.5)
Lymphs Abs: 2498 cells/uL (ref 850–3900)
MCH: 33.3 pg — ABNORMAL HIGH (ref 27.0–33.0)
MCHC: 32.4 g/dL (ref 32.0–36.0)
MCV: 102.8 fL — ABNORMAL HIGH (ref 80.0–100.0)
MPV: 10.4 fL (ref 7.5–12.5)
Monocytes Relative: 9.1 %
Neutro Abs: 4723 cells/uL (ref 1500–7800)
Neutrophils Relative %: 56.9 %
Platelets: 440 10*3/uL — ABNORMAL HIGH (ref 140–400)
RBC: 2.82 10*6/uL — ABNORMAL LOW (ref 3.80–5.10)
RDW: 12.1 % (ref 11.0–15.0)
Total Lymphocyte: 30.1 %
WBC: 8.3 10*3/uL (ref 3.8–10.8)

## 2023-01-23 NOTE — Telephone Encounter (Signed)
Can she come now?

## 2023-01-23 NOTE — Telephone Encounter (Signed)
  Chief Complaint: Bad odor from wound, Low fever Symptoms: above Frequency: today Pertinent Negatives: Patient denies  Disposition: [] ED /[] Urgent Care (no appt availability in office) / [] Appointment(In office/virtual)/ []  [facility]/ [] Home Care/ [x] Refused Recommended Disposition /[] [facility]/ []  Follow-up with PCP Additional Notes: Pt called regarding wound from May. Pt was seen by home health yesterday. Home health stated that wound was not drying out and did not put Vaseline ointment on wound. Today bandage was loose, pt removed bandage. Pt states there is a bad odor coming from the wound, and there may be pus. Pt states that her temp is 99. Her normal is 97 or so.  Pt refuses, ED and UC. PT would like antibiotics called into pharmacy. Home health will be back tomorrow. Please advise.    Reason for Disposition  [1] Looks infected (spreading redness, red streak, pus) AND [2] fever  Answer Assessment - Initial Assessment Questions 1. LOCATION: "Where is the wound located?"      Forearm 2. WOUND APPEARANCE: "What does the wound look like?"      Oozing pus 3. SIZE: If redness is present, ask: "What is the size of the red area?" (Inches, centimeters, or compare to size of a coin)      Unchanged 4. SPREAD: "What's changed in the last day?"  "Do you see any red streaks coming from the wound?"     No possible pus 5. ONSET: "When did it start to look infected?"      Smell badly 6. MECHANISM: "How did the wound start, what was the cause?"     Weeks ago 7. PAIN: Do you have any pain?"  If Yes, ask: "How bad is the pain?"  (e.g., Scale 1-10; mild, moderate, or severe)    - MILD (1-3): Doesn't interfere with normal activities.     - MODERATE (4-7): Interferes with normal activities or awakens from sleep.    - SEVERE (8-10): Excruciating pain, unable to do any normal activities.       moderate 8. FEVER: "Do you have a fever?" If Yes, ask: "What is your temperature,  how was it measured, and when did it start?"     Yes - 9. OTHER SYMPTOMS: "Do you have any other symptoms?" (e.g., shaking chills, weakness, rash elsewhere on body)     Low fever  Protocols used: Wound Infection Suspected-A-AH

## 2023-01-23 NOTE — Progress Notes (Signed)
   Acute Office Visit  Subjective:     Patient ID: Teresa Chambers, female    DOB: Jun 08, 1936, 87 y.o.   MRN: [id number]  No chief complaint on file.   HPI Patient is in today for wound check. She states she fell while gardening on June 16th and sustained a large skin tear. She was in the ER and seen here the end of June as well. Labs showing hemoglobin 9.6 on 01/06/23 which is abnormally low for her. She does have wound care nursing coming to her house for bandage changes.   Review of Systems  Constitutional:  Negative for chills and fever.  Skin:        Per HPI        Objective:    BP 122/68   Pulse (!) 113   Temp 97.8 F (36.6 C)   Resp 18   Ht 5\' 6"  (1.676 m)   Wt 152 lb (68.9 kg)   SpO2 95%   BMI 24.53 kg/m  BP Readings from Last 3 Encounters:  01/23/23 122/68  01/15/23 120/72  12/23/22 100/60   Wt Readings from Last 3 Encounters:  01/23/23 152 lb (68.9 kg)  01/15/23 152 lb 3.2 oz (69 kg)  12/23/22 155 lb (70.3 kg)      Physical Exam Constitutional:      Appearance: Normal appearance.  HENT:     Head: Normocephalic and atraumatic.  Eyes:     Conjunctiva/sclera: Conjunctivae normal.  Cardiovascular:     Rate and Rhythm: Normal rate and regular rhythm.  Pulmonary:     Effort: Pulmonary effort is normal.     Breath sounds: Normal breath sounds.  Skin:    Comments: Extensive skin tear on left forearm, more proximal area closed and healed but distal area is open but with good granulation tissue and mild bleeding. Does not appear infected at this time.  Neurological:     General: No focal deficit present.     Mental Status: She is alert. Mental status is at baseline.  Psychiatric:        Mood and Affect: Mood normal.        Behavior: Behavior normal.     No results found for any visits on 01/23/23.      Assessment & Plan:   1. Skin tear of forearm without complication, left, sequela: Wound without signs of infection just open and not healing.  Referral placed to wound care, may require debridement.   - AMB referral to wound care center  2. Anemia, unspecified type: Recheck hemoglobin today.   - CBC w/Diff/Platelet   Return in about 4 weeks (around 02/20/2023).  Margarita Mail, DO

## 2023-01-26 ENCOUNTER — Ambulatory Visit: Payer: PPO | Admitting: Family Medicine

## 2023-01-28 ENCOUNTER — Telehealth: Payer: Self-pay | Admitting: Internal Medicine

## 2023-01-28 NOTE — Telephone Encounter (Signed)
Home Health Verbal Orders - Caller/Agency: [facility]  requesting verbal approval for plan of care for PT  Callback Number: Amy #: [phone]  Requesting Physical Therapy  Frequency: 2 times a week for 2 weeks 1 time a week for 2 weeks

## 2023-02-03 ENCOUNTER — Telehealth: Payer: Self-pay | Admitting: Internal Medicine

## 2023-02-03 ENCOUNTER — Ambulatory Visit: Payer: PPO | Admitting: Physician Assistant

## 2023-02-03 NOTE — Telephone Encounter (Signed)
FYI

## 2023-02-03 NOTE — Telephone Encounter (Signed)
Copied from CRM [id number]. Topic: General - Inquiry >> Feb 03, 2023 11:10 AM De Blanch wrote: Reason for CRM:Kim nurse from the [facility] at [facility]. Received referral for pt she had an appointment today; however, Pt called and told them she wasn't going to the appointment and that she did not need them.  Pt stated has a nurse coming to her house.  Selena Batten stated that if their services are still needed, please reach out to them and let them know to reschedule.  Please advise.

## 2023-02-19 NOTE — Progress Notes (Signed)
   Acute Office Visit  Subjective:     Patient ID: Teresa Chambers, female    DOB: 1936-05-20, 87 y.o.   MRN: [id number]  Chief Complaint  Patient presents with   Follow-up    HPI Patient is in today for follow up in skin tear. Was send to wound care but canceled appointment because her skin tear has completely healed. No longer open, no drainage, hardly any scarring. Overall doing much better. Is having some bilateral pitting edema in the lower extremities, symmetric, nonpainful.  Patient does wear compression stockings and elevates legs in the evenings.   Review of Systems  Constitutional:  Negative for chills and fever.  Respiratory:  Negative for sputum production.   Cardiovascular:  Positive for leg swelling. Negative for chest pain.  Skin: Negative.         Objective:    BP 102/62   Pulse (!) 105   Temp 97.6 F (36.4 C)   Resp 18   Ht 5\' 6"  (1.676 m)   Wt 150 lb 4.8 oz (68.2 kg)   SpO2 97%   BMI 24.26 kg/m  BP Readings from Last 3 Encounters:  02/20/23 102/62  01/23/23 122/68  01/15/23 120/72   Wt Readings from Last 3 Encounters:  02/20/23 150 lb 4.8 oz (68.2 kg)  01/23/23 152 lb (68.9 kg)  01/15/23 152 lb 3.2 oz (69 kg)      Physical Exam Constitutional:      Appearance: Normal appearance.  HENT:     Head: Normocephalic and atraumatic.  Eyes:     Conjunctiva/sclera: Conjunctivae normal.  Cardiovascular:     Rate and Rhythm: Normal rate and regular rhythm.  Pulmonary:     Effort: Pulmonary effort is normal.     Breath sounds: Normal breath sounds.  Musculoskeletal:     Right lower leg: Edema present.     Left lower leg: Edema present.  Skin:    Comments: Skin tear resolved  Neurological:     General: No focal deficit present.     Mental Status: She is alert. Mental status is at baseline.  Psychiatric:        Mood and Affect: Mood normal.        Behavior: Behavior normal.     No results found for any visits on 02/20/23.      Assessment  & Plan:   1. Skin tear of forearm without complication, left, sequela: Resolved.   2. Swelling of lower extremity: Continue to elevate legs, use compression stockings.  I do not want to treat with diuretics due to low blood pressure, will manage more with conservative measures described above, decrease sodium and fluid intake.  Patient to return the end of September for physical, will check labs at that time.   Return for already scheduled .  Margarita Mail, DO

## 2023-02-20 ENCOUNTER — Encounter: Payer: Self-pay | Admitting: Internal Medicine

## 2023-02-20 ENCOUNTER — Ambulatory Visit (INDEPENDENT_AMBULATORY_CARE_PROVIDER_SITE_OTHER): Payer: PPO | Admitting: Internal Medicine

## 2023-02-20 VITALS — BP 102/62 | HR 105 | Temp 97.6°F | Resp 18 | Ht 66.0 in | Wt 150.3 lb

## 2023-02-20 DIAGNOSIS — S51812S Laceration without foreign body of left forearm, sequela: Secondary | ICD-10-CM

## 2023-02-20 DIAGNOSIS — M7989 Other specified soft tissue disorders: Secondary | ICD-10-CM

## 2023-03-07 ENCOUNTER — Encounter: Payer: Self-pay | Admitting: Internal Medicine

## 2023-03-09 ENCOUNTER — Encounter: Payer: Self-pay | Admitting: Family Medicine

## 2023-03-09 ENCOUNTER — Telehealth: Payer: PPO | Admitting: Family Medicine

## 2023-03-09 VITALS — BP 100/66 | HR 86 | Ht 66.0 in | Wt 150.0 lb

## 2023-03-09 DIAGNOSIS — R051 Acute cough: Secondary | ICD-10-CM | POA: Diagnosis not present

## 2023-03-09 DIAGNOSIS — U071 COVID-19: Secondary | ICD-10-CM

## 2023-03-09 DIAGNOSIS — Z79899 Other long term (current) drug therapy: Secondary | ICD-10-CM

## 2023-03-09 MED ORDER — BENZONATATE 100 MG PO CAPS
100.0000 mg | ORAL_CAPSULE | Freq: Three times a day (TID) | ORAL | 0 refills | Status: DC | PRN
Start: 2023-03-09 — End: 2023-06-09

## 2023-03-09 MED ORDER — APIXABAN 2.5 MG PO TABS
2.5000 mg | ORAL_TABLET | Freq: Two times a day (BID) | ORAL | 0 refills | Status: DC
Start: 2023-03-09 — End: 2023-04-28

## 2023-03-09 MED ORDER — NIRMATRELVIR/RITONAVIR (PAXLOVID)TABLET
3.0000 | ORAL_TABLET | Freq: Two times a day (BID) | ORAL | 0 refills | Status: AC
Start: 2023-03-09 — End: 2023-03-14

## 2023-03-09 NOTE — Progress Notes (Signed)
Name: Teresa Chambers   MRN: [id number]    DOB: August 08, 1935   Date:03/09/2023       Progress Note  Subjective  Chief Complaint  COVID Positive  I connected with  [name]  on 03/09/23 at  2:20 PM EDT by a video enabled telemedicine application and verified that I am speaking with the correct person using two identifiers.  I discussed the limitations of evaluation and management by telemedicine and the availability of in person appointments. The patient expressed understanding and agreed to proceed with the virtual visit  Staff also discussed with the patient that there may be a patient responsible charge related to this service. Patient Location: at home  Provider Location: [facility] Additional Individuals present: alone   HPI  COVID-19: she was exposed to COVID while on vacation at the beach about 4 days ago, two days ago she noticed facial pressure and nasal congestion. Now she has a productive cough/clear to yellowish mucus, no fever, mild SOB but normal pulse oxy  and a cough. She has been vaccinated for COVID but not in over one year. She had COVID twice in the past but never took medications but is feeling a little sicker this time around . Discussed Paxlovid, possible side effects and drug to drug interaction. She is willing to go down on dose of Eliquis to 2.5 mg BID while taking medication, and will add saline spray, mucinex and tessalon perles for cough     Patient Active Problem List   Diagnosis Date Noted   Palpitation 05/02/2022   Cellulitis and abscess of lower extremity 02/20/2021   Raynaud's disease without gangrene 12/31/2020   Hypothyroidism due to Hashimoto's thyroiditis 06/11/2020   Callus of foot 05/28/2020   Essential hypertension 05/28/2020   Annual physical exam 05/28/2020   Major depression, recurrent, chronic (HCC) 04/09/2020   Pneumonia 11/10/2019   Sepsis without acute organ dysfunction (HCC) 11/10/2019   Leukocytosis 10/23/2019   Chronic systolic  (congestive) heart failure (HCC) 10/22/2019   Closed fracture of multiple pubic rami, left, initial encounter (HCC) 10/22/2019   Closed fracture of proximal end of right humerus 01/10/2019   Chronic venous insufficiency 09/06/2018   Lymphedema 09/06/2018   GERD without esophagitis 03/10/2018   Bilateral dry eyes 03/10/2018   Chronic obstructive pulmonary disease (HCC) 03/04/2018   Acute exacerbation of COPD with asthma (HCC) 02/22/2018   Chronic anemia 02/22/2018   Chronic constipation 02/22/2018   Chronic bilateral thoracic back pain 02/22/2018   Closed displaced fracture of right femoral neck (HCC) 02/22/2018   CHF (congestive heart failure) (HCC)    Status post hip hemiarthroplasty 02/12/2018   Hip fracture (HCC) 02/11/2018   Asthma 04/30/2017   Chronic reflux esophagitis 04/30/2017   Dyslipidemia 04/30/2017   Osteoporosis 04/30/2017   Cardiac murmur, previously undiagnosed 04/30/2017   Diverticulosis of small intestine without hemorrhage 04/30/2017   Dyspnea on exertion 04/30/2017   Hx of adenomatous colonic polyps 04/30/2017   Hx of cardiomegaly 04/30/2017   Lumbar arthropathy 04/30/2017   Nasopharyngitis 04/30/2017   Nonspecific chest pain 04/30/2017   Osteoarthropathy 04/30/2017   Leg swelling 04/30/2017   Sinusitis, acute 04/30/2017   History of bone density study 06/28/2004    Past Surgical History:  Procedure Laterality Date   CATARACT EXTRACTION     07/21/2001 - 07/20/2002   CHOLECYSTECTOMY     07/21/1997 - 07/20/1998   COLON SURGERY     COLONOSCOPY     05/05/2000, 01/17/2000 Adenomatous Polyps  COLONOSCOPY     11/05/2009, 12/23/2004, 07/07/2001   PH Adenomatous Polyps: CBF 10/2014; recall ltr mailed 09/18/2014 (dw)    ESOPHAGOGASTRODUODENOSCOPY     11/05/2009, 05/05/2000   EYE SURGERY     FLEXIBLE SIGMOIDOSCOPY  11/28/1999   HAND SURGERY  2006   HERNIA REPAIR     HIP ARTHROPLASTY Right 02/11/2018   Procedure: ARTHROPLASTY BIPOLAR HIP (HEMIARTHROPLASTY);   Surgeon: Christena Flake, MD;  Location: [facility] ORS;  Service: Orthopedics;  Laterality: Right;   JOINT REPLACEMENT     LAPAROSCOPIC COLON RESECTION     2001    Family History  Problem Relation Age of Onset   Heart disease Mother    Heart disease Father    Hypertension Son    Hypertension Daughter     Social History   Socioeconomic History   Marital status: Divorced    Spouse name: Not on file   Number of children: 5   Years of education: 15.5   Highest education level: Some college, no degree  Occupational History   Occupation: retired  Tobacco Use   Smoking status: Never   Smokeless tobacco: Never  Vaping Use   Vaping status: Never Used  Substance and Sexual Activity   Alcohol use: Not Currently   Drug use: Never   Sexual activity: Not Currently  Other Topics Concern   Not on file  Social History Narrative   Lives independently, has home at [facility] she enjoys traveling to   Social Determinants of Health   Financial Resource Strain: Low Risk  (11/13/2022)   Overall Financial Resource Strain (CARDIA)    Difficulty of Paying Living Expenses: Not hard at all  Food Insecurity: No Food Insecurity (11/13/2022)   Hunger Vital Sign    Worried About Running Out of Food in the Last Year: Never true    Ran Out of Food in the Last Year: Never true  Transportation Needs: No Transportation Needs (11/13/2022)   PRAPARE - Administrator, Civil Service (Medical): No    Lack of Transportation (Non-Medical): No  Physical Activity: Insufficiently Active (11/13/2022)   Exercise Vital Sign    Days of Exercise per Week: 7 days    Minutes of Exercise per Session: 20 min  Stress: No Stress Concern Present (11/13/2022)   [employer] of Occupational Health - Occupational Stress Questionnaire    Feeling of Stress : Not at all  Social Connections: Unknown (01/04/2023)   [name], [facility]   Social Network    Social Network: Not on file  Recent  Concern: Social Connections - Socially Isolated (11/13/2022)   Social Connection and Isolation Panel [NHANES]    Frequency of Communication with Friends and Family: More than three times a week    Frequency of Social Gatherings with Friends and Family: Once a week    Attends Religious Services: Never    Database administrator or Organizations: No    Attends Banker Meetings: Never    Marital Status: Divorced  Catering manager Violence: Not At Risk (01/06/2023)   Received from [employer], [facility]   HITS    Over the last 12 months how often did your partner physically hurt you?: 1    Over the last 12 months how often did your partner insult you or talk down to you?: 1    Over the last 12 months how often did your partner threaten you with physical harm?: 1    Over the  last 12 months how often did your partner scream or curse at you?: 1     Current Outpatient Medications:    acetaminophen (TYLENOL 8 HOUR ARTHRITIS PAIN) 650 MG CR tablet, , Disp: , Rfl:    albuterol (VENTOLIN HFA) 108 (90 Base) MCG/ACT inhaler, Inhale 2 puffs into the lungs every 6 (six) hours as needed for wheezing or shortness of breath., Disp: 8 g, Rfl: 2   apixaban (ELIQUIS) 2.5 MG TABS tablet, Take 1 tablet (2.5 mg total) by mouth 2 (two) times daily. In place of eliquis 5 mg while taking COVID medication, Disp: 10 tablet, Rfl: 0   apixaban (ELIQUIS) 5 MG TABS tablet, Take 1 tablet (5 mg total) by mouth 2 (two) times daily., Disp: 60 tablet, Rfl: 6   Ascorbic Acid (VITAMIN C WITH ROSE HIPS) 1000 MG tablet, Take 1,000 mg by mouth daily., Disp: , Rfl:    benzonatate (TESSALON) 100 MG capsule, Take 1-2 capsules (100-200 mg total) by mouth 3 (three) times daily as needed., Disp: 40 capsule, Rfl: 0   Calcium Carbonate-Vitamin D 600-400 MG-UNIT tablet, Take 1 tablet by mouth daily., Disp: , Rfl:    DULoxetine (CYMBALTA) 60 MG capsule, TAKE 1 CAPSULE BY MOUTH EVERY DAY, Disp: 90 capsule, Rfl: 1   ezetimibe  (ZETIA) 10 MG tablet, TAKE 1 TABLET(10 MG) BY MOUTH DAILY, Disp: 90 tablet, Rfl: 3   ferrous sulfate 325 (65 FE) MG EC tablet, Take 325 mg by mouth 3 (three) times daily with meals., Disp: , Rfl:    fluticasone (FLONASE) 50 MCG/ACT nasal spray, SHAKE LIQUID AND USE 2 SPRAYS IN EACH NOSTRIL DAILY, Disp: 48 g, Rfl: 6   fluticasone furoate-vilanterol (BREO ELLIPTA) 200-25 MCG/ACT AEPB, Inhale 1 puff into the lungs daily., Disp: 28 each, Rfl: 4   levothyroxine (SYNTHROID) 50 MCG tablet, TAKE 1 TABLET(50 MCG) BY MOUTH DAILY, Disp: 90 tablet, Rfl: 3   metoprolol succinate (TOPROL-XL) 25 MG 24 hr tablet, Take 1 tablet (25 mg total) by mouth daily., Disp: 90 tablet, Rfl: 3   mupirocin ointment (BACTROBAN) 2 %, Apply 1 Application topically daily., Disp: 22 g, Rfl: 0   nirmatrelvir/ritonavir (PAXLOVID) 20 x 150 MG & 10 x 100MG  TABS, Take 3 tablets by mouth 2 (two) times daily for 5 days. (Take nirmatrelvir 150 mg two tablets twice daily for 5 days and ritonavir 100 mg one tablet twice daily for 5 days) Patient GFR is 87, Disp: 30 tablet, Rfl: 0   omeprazole (PRILOSEC) 40 MG capsule, Take 1 capsule (40 mg total) by mouth daily., Disp: 90 capsule, Rfl: 1   sacubitril-valsartan (ENTRESTO) 24-26 MG, Take 1 tablet by mouth 2 (two) times daily., Disp: 60 tablet, Rfl: 2   traMADol (ULTRAM) 50 MG tablet, , Disp: , Rfl:   Allergies  Allergen Reactions   Codeine Nausea And Vomiting   Lidocaine Swelling and Anaphylaxis    I personally reviewed active problem list, medication list, allergies, family history, social history, health maintenance with the patient/caregiver today.   ROS  Ten systems reviewed and is negative except as mentioned in HPI    Objective  Virtual encounter, vitals not obtained.  Vitals:   03/09/23 1329  BP: 100/66  Pulse: 86  SpO2: 95%    Physical Exam  Awake, alert and oriented, standing up and does not seem to be in distress  Speaking in full sentences   PHQ2/9:     03/09/2023    9:36 AM 02/20/2023    1:07 PM 01/15/2023  1:09 PM 11/13/2022    1:33 PM 10/16/2022    3:12 PM  Depression screen PHQ 2/9  Decreased Interest 0 0 0 1 0  Down, Depressed, Hopeless 0 0 0 0 0  PHQ - 2 Score 0 0 0 1 0  Altered sleeping 0 0   0  Tired, decreased energy 0 0   0  Change in appetite 0 0   0  Feeling bad or failure about yourself  0 0   0  Trouble concentrating 0 0   0  Moving slowly or fidgety/restless 0 0   0  Suicidal thoughts 0 0   0  PHQ-9 Score 0 0   0  Difficult doing work/chores  Not difficult at all   Not difficult at all   PHQ-2/9 Result is negative.    Fall Risk:    03/09/2023    9:35 AM 02/20/2023    1:07 PM 01/15/2023    1:08 PM 11/13/2022    1:29 PM 10/16/2022    3:11 PM  Fall Risk   Falls in the past year? 1 1  0 0  Number falls in past yr: 0 0 1 0 0  Injury with Fall? 1 1 1  0 0  Risk for fall due to : Impaired balance/gait  History of fall(s);Impaired balance/gait;Impaired mobility No Fall Risks   Follow up Falls prevention discussed   Education provided;Falls prevention discussed      Assessment & Plan  1. COVID-19  - nirmatrelvir/ritonavir (PAXLOVID) 20 x 150 MG & 10 x 100MG  TABS; Take 3 tablets by mouth 2 (two) times daily for 5 days. (Take nirmatrelvir 150 mg two tablets twice daily for 5 days and ritonavir 100 mg one tablet twice daily for 5 days) Patient GFR is 87  Dispense: 30 tablet; Refill: 0  Explained increase risk of bleeding, go down on eliquis while taking paxlovid  Discussed importance of calling 911 if worsening of symptoms and or drop of pulse ox   2. High risk medication use  - apixaban (ELIQUIS) 2.5 MG TABS tablet; Take 1 tablet (2.5 mg total) by mouth 2 (two) times daily. In place of eliquis 5 mg while taking COVID medication  Dispense: 10 tablet; Refill: 0  3. Acute cough  - benzonatate (TESSALON) 100 MG capsule; Take 1-2 capsules (100-200 mg total) by mouth 3 (three) times daily as needed.  Dispense: 40 capsule;  Refill: 0   I discussed the assessment and treatment plan with the patient. The patient was provided an opportunity to ask questions and all were answered. The patient agreed with the plan and demonstrated an understanding of the instructions.  The patient was advised to call back or seek an in-person evaluation if the symptoms worsen or if the condition fails to improve as anticipated.  I provided 25  minutes of non-face-to-face time during this encounter.

## 2023-04-20 ENCOUNTER — Encounter: Payer: PPO | Admitting: Internal Medicine

## 2023-04-28 ENCOUNTER — Ambulatory Visit (INDEPENDENT_AMBULATORY_CARE_PROVIDER_SITE_OTHER): Payer: PPO | Admitting: Internal Medicine

## 2023-04-28 ENCOUNTER — Encounter: Payer: Self-pay | Admitting: Internal Medicine

## 2023-04-28 VITALS — BP 102/60 | HR 104 | Temp 97.9°F | Resp 16 | Ht 66.0 in | Wt 143.9 lb

## 2023-04-28 DIAGNOSIS — D649 Anemia, unspecified: Secondary | ICD-10-CM

## 2023-04-28 DIAGNOSIS — K219 Gastro-esophageal reflux disease without esophagitis: Secondary | ICD-10-CM

## 2023-04-28 DIAGNOSIS — Z0000 Encounter for general adult medical examination without abnormal findings: Secondary | ICD-10-CM

## 2023-04-28 DIAGNOSIS — E063 Autoimmune thyroiditis: Secondary | ICD-10-CM | POA: Diagnosis not present

## 2023-04-28 DIAGNOSIS — Z23 Encounter for immunization: Secondary | ICD-10-CM

## 2023-04-28 MED ORDER — PANTOPRAZOLE SODIUM 40 MG PO TBEC
40.0000 mg | DELAYED_RELEASE_TABLET | Freq: Every day | ORAL | 3 refills | Status: DC
Start: 2023-04-28 — End: 2023-07-30

## 2023-04-28 NOTE — Patient Instructions (Addendum)
It was great seeing you today!  Plan discussed at today's visit: -Blood work ordered today, results will be uploaded to MyChart.  -Recommend switching Claritin to Xyzal or Allegra  Follow up in: 3 months  Take care and let us know if you have any questions or concerns prior to your next visit.  Dr. Caralee Ates

## 2023-04-29 LAB — COMPLETE METABOLIC PANEL WITH GFR
AG Ratio: 2.1 (calc) (ref 1.0–2.5)
ALT: 12 U/L (ref 6–29)
AST: 13 U/L (ref 10–35)
Albumin: 4.1 g/dL (ref 3.6–5.1)
Alkaline phosphatase (APISO): 50 U/L (ref 37–153)
BUN: 15 mg/dL (ref 7–25)
CO2: 32 mmol/L (ref 20–32)
Calcium: 9.3 mg/dL (ref 8.6–10.4)
Chloride: 103 mmol/L (ref 98–110)
Creat: 0.63 mg/dL (ref 0.60–0.95)
Globulin: 2 g/dL (ref 1.9–3.7)
Glucose, Bld: 91 mg/dL (ref 65–99)
Potassium: 4.5 mmol/L (ref 3.5–5.3)
Sodium: 142 mmol/L (ref 135–146)
Total Bilirubin: 0.4 mg/dL (ref 0.2–1.2)
Total Protein: 6.1 g/dL (ref 6.1–8.1)
eGFR: 87 mL/min/{1.73_m2} (ref >=60)

## 2023-04-29 LAB — CBC WITH DIFFERENTIAL/PLATELET
Absolute Monocytes: 539 {cells}/uL (ref 200–950)
Basophils Absolute: 70 {cells}/uL (ref 0–200)
Basophils Relative: 1 %
Eosinophils Absolute: 147 {cells}/uL (ref 15–500)
Eosinophils Relative: 2.1 %
HCT: 39.8 % (ref 35.0–45.0)
Hemoglobin: 12.9 g/dL (ref 11.7–15.5)
Lymphs Abs: 2401 {cells}/uL (ref 850–3900)
MCH: 31.8 pg (ref 27.0–33.0)
MCHC: 32.4 g/dL (ref 32.0–36.0)
MCV: 98 fL (ref 80.0–100.0)
MPV: 10.6 fL (ref 7.5–12.5)
Monocytes Relative: 7.7 %
Neutro Abs: 3843 {cells}/uL (ref 1500–7800)
Neutrophils Relative %: 54.9 %
Platelets: 280 10*3/uL (ref 140–400)
RBC: 4.06 Million/uL (ref 3.80–5.10)
RDW: 13.2 % (ref 11.0–15.0)
Total Lymphocyte: 34.3 %
WBC: 7 10*3/uL (ref 3.8–10.8)

## 2023-04-29 LAB — IRON,TIBC AND FERRITIN PANEL
%SAT: 39 % (ref 16–45)
Ferritin: 56 ng/mL (ref 16–288)
Iron: 111 ug/dL (ref 45–160)
TIBC: 283 ug/dL (ref 250–450)

## 2023-04-29 LAB — TSH: TSH: 1.86 m[IU]/L (ref 0.40–4.50)

## 2023-04-29 LAB — B12 AND FOLATE PANEL
Folate: 13.3 ng/mL
Vitamin B-12: 657 pg/mL (ref 200–1100)

## 2023-05-19 ENCOUNTER — Other Ambulatory Visit: Payer: Self-pay | Admitting: Cardiovascular Disease

## 2023-05-20 ENCOUNTER — Other Ambulatory Visit: Payer: Self-pay | Admitting: Internal Medicine

## 2023-05-20 DIAGNOSIS — E785 Hyperlipidemia, unspecified: Secondary | ICD-10-CM

## 2023-05-20 NOTE — Telephone Encounter (Signed)
Prescription refill request for Eliquis received. Indication:palps Last office visit:6/24 Scr:0.63  10/24 Age: 87 Weight:65.3  kg  Prescription refilled

## 2023-05-20 NOTE — Telephone Encounter (Signed)
Requesting a 90 day supply, states that Dr. Juel Burrow was the last to prescribe to this but she states he is no longer in business.   Medication Refill - Medication: ezetimibe (ZETIA) 10 MG tablet   Has the patient contacted their pharmacy? Yes.   (Agent: If no, request that the patient contact the pharmacy for the refill. If patient does not wish to contact the pharmacy document the reason why and proceed with request.) (Agent: If yes, when and what did the pharmacy advise?)  Preferred Pharmacy (with phone number or street name):  [id number] [place], [place] - [address]  [address] [place] [address]  Phone: [phone] Fax: [phone]   Has the patient been seen for an appointment in the last year OR does the patient have an upcoming appointment? Yes.    Agent: Please be advised that RX refills may take up to 3 business days. We ask that you follow-up with your pharmacy.

## 2023-05-21 MED ORDER — EZETIMIBE 10 MG PO TABS
10.0000 mg | ORAL_TABLET | Freq: Every day | ORAL | 1 refills | Status: DC
Start: 2023-05-21 — End: 2023-11-13

## 2023-05-21 NOTE — Telephone Encounter (Signed)
Provider not at this office

## 2023-05-21 NOTE — Telephone Encounter (Signed)
Requested medication (s) are due for refill today: yes  Requested medication (s) are on the active medication list: yes  Last refill:  05/08/22  Future visit scheduled: yes  Notes to clinic:  Unable to refill per protocol, last refill by another provider.      Requested Prescriptions  Pending Prescriptions Disp Refills   ezetimibe (ZETIA) 10 MG tablet 90 tablet 3     Cardiovascular:  Antilipid - Sterol Transport Inhibitors Failed - 05/20/2023 12:24 PM      Failed - Lipid Panel in normal range within the last 12 months    Cholesterol  Date Value Ref Range Status  06/04/2021 196 <200 mg/dL Final   LDL Cholesterol (Calc)  Date Value Ref Range Status  06/04/2021 107 (H) mg/dL (calc) Final    Comment:    Reference range: <100 . Desirable range <100 mg/dL for primary prevention;   <70 mg/dL for patients with CHD or diabetic patients  with > or = 2 CHD risk factors. Marland Kitchen LDL-C is now calculated using the Martin-Hopkins  calculation, which is a validated novel method providing  better accuracy than the Friedewald equation in the  estimation of LDL-C.  Horald Pollen et al. Lenox Ahr. 9562;130(86): 2061-2068  (http://education.[employer].com/faq/FAQ164)    HDL  Date Value Ref Range Status  06/04/2021 70 > OR = 50 mg/dL Final   Triglycerides  Date Value Ref Range Status  06/04/2021 97 <150 mg/dL Final         Passed - AST in normal range and within 360 days    AST  Date Value Ref Range Status  04/28/2023 13 10 - 35 U/L Final         Passed - ALT in normal range and within 360 days    ALT  Date Value Ref Range Status  04/28/2023 12 6 - 29 U/L Final         Passed - Patient is not pregnant      Passed - Valid encounter within last 12 months    Recent Outpatient Visits           3 weeks ago Annual physical exam   [facility] Margarita Mail, DO   2 months ago COVID-19   [facility] Alba Cory, MD   3  months ago Skin tear of forearm without complication, left, sequela   [name] Margarita Mail, DO   3 months ago Skin tear of forearm without complication, left, sequela   [facility] Margarita Mail, DO   4 months ago Fall, subsequent encounter   [facility] Berniece Salines, FNP       Future Appointments             In 1 month Gollan, Tollie Pizza, MD [name] [place]   In 2 months Margarita Mail, DO  [facility]   In 2 months Deirdre Evener, MD [facility]

## 2023-06-08 ENCOUNTER — Ambulatory Visit: Payer: Self-pay | Admitting: *Deleted

## 2023-06-08 NOTE — Telephone Encounter (Signed)
Summary: requesting an antibiotic for cough/nasal drip   Per agent: "Patient called and states she came home from the beach yesterday and the weather was up and down and she has a post nasal drip, coughing up chunks of yellow stuff and it's in her lungs.  Patient is requesting an antitbiotic without coming in to be seen.   Patient says her oxygen is 93, pulse rate 101 and stuff she coughs up is "sweet" not metallic taste.  Please advise, Patients callback # [phone]"       Chief Complaint: Cough Symptoms: Productive cough, yellowish. Sinus drainage. Wheezing yesterday, not presently, using inhalers. Sore throat Frequency: Friday Pertinent Negatives: Patient denies fever Disposition: [] ED /[] Urgent Care (no appt availability in office) / [x] Appointment(In office/virtual)/ []  [place] [facility]/ [] Home Care/ [] Refused Recommended Disposition /[]  Mobile Bus/ []  Follow-up with PCP Additional Notes: Appt secured for tomorrow. Care advise provided, pt verbalizes understanding.  Reason for Disposition  [1] Continuous (nonstop) coughing interferes with work or school AND [2] no improvement using cough treatment per Care Advice  Answer Assessment - Initial Assessment Questions 1. ONSET: "When did the cough begin?"      Friday 2. SEVERITY: "How bad is the cough today?"      "Bad" Awake at night 3. SPUTUM: "Describe the color of your sputum" (none, dry cough; clear, white, yellow, green)     Yellowish 4. HEMOPTYSIS: "Are you coughing up any blood?" If so ask: "How much?" (flecks, streaks, tablespoons, etc.) no  5. DIFFICULTY BREATHING: "Are you having difficulty breathing?" If Yes, ask: "How bad is it?" (e.g., mild, moderate, severe)    - MILD: No SOB at rest, mild SOB with walking, speaks normally in sentences, can lie down, no retractions, pulse < 100.    - MODERATE: SOB at rest, SOB with minimal exertion and prefers to sit, cannot lie down flat, speaks in phrases,  mild retractions, audible wheezing, pulse 100-120.    - SEVERE: Very SOB at rest, speaks in single words, struggling to breathe, sitting hunched forward, retractions, pulse > 120      SOB last night 6. FEVER: "Do you have a fever?" If Yes, ask: "What is your temperature, how was it measured, and when did it start?"     No 10. OTHER SYMPTOMS: "Do you have any other symptoms?" (e.g., runny nose, wheezing, chest pain)       Using inhalers, no wheezing today. Sore throat, sinus drainage  Protocols used: Cough - Acute Productive-A-AH

## 2023-06-09 ENCOUNTER — Ambulatory Visit
Admission: RE | Admit: 2023-06-09 | Discharge: 2023-06-09 | Disposition: A | Discharge status: Home / Self Care | Payer: PPO | Source: Ambulatory Visit | Attending: Internal Medicine | Admitting: Internal Medicine

## 2023-06-09 ENCOUNTER — Ambulatory Visit (INDEPENDENT_AMBULATORY_CARE_PROVIDER_SITE_OTHER): Payer: PPO | Admitting: Internal Medicine

## 2023-06-09 ENCOUNTER — Ambulatory Visit
Admission: RE | Admit: 2023-06-09 | Discharge: 2023-06-09 | Disposition: A | Discharge status: Home / Self Care | Payer: PPO | Attending: Internal Medicine | Admitting: Internal Medicine

## 2023-06-09 ENCOUNTER — Encounter: Payer: Self-pay | Admitting: Internal Medicine

## 2023-06-09 VITALS — BP 114/70 | HR 102 | Temp 98.1°F | Resp 18 | Ht 66.0 in | Wt 144.5 lb

## 2023-06-09 DIAGNOSIS — R051 Acute cough: Secondary | ICD-10-CM

## 2023-06-09 DIAGNOSIS — J069 Acute upper respiratory infection, unspecified: Secondary | ICD-10-CM

## 2023-06-09 LAB — POCT INFLUENZA A/B
Influenza A, POC: NEGATIVE
Influenza B, POC: NEGATIVE

## 2023-06-09 MED ORDER — BENZONATATE 100 MG PO CAPS: 100.0000 mg | ORAL_CAPSULE | Freq: Two times a day (BID) | ORAL | 0 refills | Status: DC | PRN

## 2023-06-09 MED ORDER — METHYLPREDNISOLONE 4 MG PO TBPK: ORAL_TABLET | ORAL | 0 refills | Status: DC

## 2023-06-09 NOTE — Progress Notes (Signed)
Acute Office Visit  Subjective:     Patient ID: Teresa Chambers, female    DOB: 1936-02-12, 87 y.o.   MRN: [id number]  Chief Complaint  Patient presents with   URI    Cough since friday    HPI Patient is in today for cough. Symptoms have been ongoing for 5 days.  URI Compliant:  -Fever: no -Cough: yes, productive yellow mucus -Shortness of breath: yes with exertion  -Wheezing: no -Chest congestion: yes -Nasal congestion: no -Runny nose: yes - clear -Post nasal drip: yes -Sore throat: yes -Sinus pressure: no -Headache: yes -Ear pain: no  -Ear pressure: no   -Fatigue: yes -Sick contacts: no -Context: fluctuating -Treatments attempted: cough syrup, Breo and Albuterol    Review of Systems  Constitutional:  Negative for chills and fever.  HENT:  Positive for congestion and sore throat. Negative for ear pain and sinus pain.   Respiratory:  Positive for cough and shortness of breath. Negative for wheezing.   Cardiovascular:  Negative for chest pain.  Neurological:  Positive for headaches.        Objective:    BP 114/70   Pulse (!) 102   Temp 98.1 F (36.7 C)   Resp 18   Ht 5\' 6"  (1.676 m)   Wt 144 lb 8 oz (65.5 kg)   SpO2 96%   BMI 23.32 kg/m  BP Readings from Last 3 Encounters:  06/09/23 114/70  04/28/23 102/60  03/09/23 100/66   Wt Readings from Last 3 Encounters:  06/09/23 144 lb 8 oz (65.5 kg)  04/28/23 143 lb 14.4 oz (65.3 kg)  03/09/23 150 lb (68 kg)      Physical Exam Constitutional:      Appearance: Normal appearance.  HENT:     Head: Normocephalic and atraumatic.     Right Ear: Tympanic membrane, ear canal and external ear normal.     Left Ear: Tympanic membrane, ear canal and external ear normal.     Nose: Nose normal.     Mouth/Throat:     Mouth: Mucous membranes are moist.     Comments: PND present Eyes:     Conjunctiva/sclera: Conjunctivae normal.  Cardiovascular:     Rate and Rhythm: Normal rate and regular rhythm.   Pulmonary:     Effort: Pulmonary effort is normal.     Breath sounds: No wheezing, rhonchi or rales.     Comments: Decreased air sounds throughout  Skin:    General: Skin is warm and dry.  Neurological:     General: No focal deficit present.     Mental Status: She is alert. Mental status is at baseline.  Psychiatric:        Mood and Affect: Mood normal.        Behavior: Behavior normal.     Results for orders placed or performed in visit on 06/09/23  POCT Influenza A/B  Result Value Ref Range   Influenza A, POC Negative Negative   Influenza B, POC Negative Negative        Assessment & Plan:   1. Upper respiratory tract infection, unspecified type/Acute cough: Discussed how her symptoms are most likely viral but due to history of severe pneumonia last year will send for chest x-ray. Flu negative, COVID pending. Will treat symptoms with steroid pack and cough suppressants. Follow up if symptoms worsen or fail to improve.   - benzonatate (TESSALON) 100 MG capsule; Take 1 capsule (100 mg total) by mouth 2 (two) times  daily as needed for cough.  Dispense: 20 capsule; Refill: 0 - methylPREDNISolone (MEDROL DOSEPAK) 4 MG TBPK tablet; Day 1: Take 8 mg (2 tablets) before breakfast, 4 mg (1 tablet) after lunch, 4 mg (1 tablet) after supper, and 8 mg (2 tablets) at bedtime. Day 2:Take 4 mg (1 tablet) before breakfast, 4 mg (1 tablet) after lunch, 4 mg (1 tablet) after supper, and 8 mg (2 tablets) at bedtime. Day 3: Take 4 mg (1 tablet) before breakfast, 4 mg (1 tablet) after lunch, 4 mg (1 tablet) after supper, and 4 mg (1 tablet) at bedtime. Day 4: Take 4 mg (1 tablet) before breakfast, 4 mg (1 tablet) after lunch, and 4 mg (1 tablet) at bedtime. Day 5: Take 4 mg (1 tablet) before breakfast and 4 mg (1 tablet) at bedtime. Day 6: Take 4 mg (1 tablet) before breakfast.  Dispense: 1 each; Refill: 0 - DG Chest 2 View; Future   Return if symptoms worsen or fail to improve.  Margarita Mail,  DO

## 2023-06-10 ENCOUNTER — Other Ambulatory Visit: Payer: Self-pay | Admitting: Internal Medicine

## 2023-06-10 DIAGNOSIS — J449 Chronic obstructive pulmonary disease, unspecified: Secondary | ICD-10-CM

## 2023-06-10 DIAGNOSIS — E063 Autoimmune thyroiditis: Secondary | ICD-10-CM

## 2023-06-11 ENCOUNTER — Other Ambulatory Visit: Payer: Self-pay | Admitting: Internal Medicine

## 2023-06-11 ENCOUNTER — Other Ambulatory Visit: Payer: Self-pay | Admitting: Cardiovascular Disease

## 2023-06-11 DIAGNOSIS — E063 Autoimmune thyroiditis: Secondary | ICD-10-CM

## 2023-06-11 NOTE — Telephone Encounter (Signed)
Requested Prescriptions  Pending Prescriptions Disp Refills   levothyroxine (SYNTHROID) 50 MCG tablet [Pharmacy Med Name: LEVOTHYROXINE 50 MCG TABLET] 90 tablet 3    Sig: TAKE 1 TABLET BY MOUTH EVERY DAY     Endocrinology:  Hypothyroid Agents Passed - 06/10/2023 11:05 AM      Passed - TSH in normal range and within 360 days    TSH  Date Value Ref Range Status  04/28/2023 1.86 0.40 - 4.50 mIU/L Final         Passed - Valid encounter within last 12 months    Recent Outpatient Visits           2 days ago Upper respiratory tract infection, unspecified type   [facility] Margarita Mail, DO   1 month ago Annual physical exam   [facility] Margarita Mail, DO   3 months ago COVID-19   [facility] Alba Cory, MD   3 months ago Skin tear of forearm without complication, left, sequela   [facility] Margarita Mail, DO   4 months ago Skin tear of forearm without complication, left, sequela   [facility] Margarita Mail, DO       Future Appointments             In 2 weeks Gollan, Tollie Pizza, MD [facility] [place]   In 1 month Margarita Mail, DO [facility]   In 2 months Deirdre Evener, MD [facility]

## 2023-06-11 NOTE — Telephone Encounter (Signed)
Medication Refill -  Most Recent Primary Care Visit:  Provider: Margarita Mail  Department: [facility]  Visit Type: OFFICE VISIT  Date: 06/09/2023  Medication: levothyroxine (SYNTHROID) 50 MCG tablet [[id number]]   Has the patient contacted their pharmacy? Yes  (Agent: If yes, when and what did the pharmacy advise?) Pharmacy sent request advised pt to call office   Is this the correct pharmacy for this prescription? Yes  This is the patient's preferred pharmacy:  [id number] [place], [place] - [address] [place] [address] Phone: [phone] Fax: [phone]   Has the prescription been filled recently? Yes  Is the patient out of the medication? Yes  Has the patient been seen for an appointment in the last year OR does the patient have an upcoming appointment? Yes  Can we respond through MyChart? No  Agent: Please be advised that Rx refills may take up to 3 business days. We ask that you follow-up with your pharmacy.

## 2023-06-11 NOTE — Telephone Encounter (Signed)
Requested medication (s) are due for refill today: Yes  Requested medication (s) are on the active medication list: Yes  Last refill:  09/19/22  Future visit scheduled: Yes  Notes to clinic:  Pharmacy requests 90 day supply and diagnosis code.    Requested Prescriptions  Pending Prescriptions Disp Refills   albuterol (VENTOLIN HFA) 108 (90 Base) MCG/ACT inhaler [Pharmacy Med Name: ALBUTEROL HFA (VENTOLIN) INH] 54 each 1    Sig: TAKE 2 PUFFS BY MOUTH EVERY 6 HOURS AS NEEDED FOR WHEEZE OR SHORTNESS OF BREATH     Pulmonology:  Beta Agonists 2 Passed - 06/10/2023 10:08 AM      Passed - Last BP in normal range    BP Readings from Last 1 Encounters:  06/09/23 114/70         Passed - Last Heart Rate in normal range    Pulse Readings from Last 1 Encounters:  06/09/23 (!) 102         Passed - Valid encounter within last 12 months    Recent Outpatient Visits           2 days ago Upper respiratory tract infection, unspecified type   [facility] Margarita Mail, DO   1 month ago Annual physical exam   [name] Margarita Mail, DO   3 months ago COVID-19   [facility]ba Cory, MD   3 months ago Skin tear of forearm without complication, left, sequela   [facility]arita Mail, DO   4 months ago Skin tear of forearm without complication, left, sequela   [facility] Margarita Mail, DO       Future Appointments             In 2 weeks Gollan, Tollie Pizza, MD [name] [place]   In 1 month Margarita Mail, DO [name], [facility]   In 2 months Deirdre Evener, MD [facility] [place] [facility]

## 2023-06-12 LAB — NOVEL CORONAVIRUS, NAA: SARS-CoV-2, NAA: NOT DETECTED

## 2023-06-12 LAB — SPECIMEN STATUS REPORT

## 2023-06-12 NOTE — Telephone Encounter (Signed)
Requested medications are due for refill today.  no  Requested medications are on the active medications list.  yes  Last refill. 06/11/2023 #90 3 rf  Future visit scheduled.   yes  Notes to clinic.  Previous rx was signed by another provider. Please review to insure that refill was appropriate. Thank you    Requested Prescriptions  Pending Prescriptions Disp Refills   levothyroxine (SYNTHROID) 50 MCG tablet 90 tablet 3     Endocrinology:  Hypothyroid Agents Passed - 06/11/2023  1:07 PM      Passed - TSH in normal range and within 360 days    TSH  Date Value Ref Range Status  04/28/2023 1.86 0.40 - 4.50 mIU/L Final         Passed - Valid encounter within last 12 months    Recent Outpatient Visits           3 days ago Upper respiratory tract infection, unspecified type   [facility] Margarita Mail, DO   1 month ago Annual physical exam   [facility]ita Mail, DO   3 months ago COVID-19   [facility]ba Cory, MD   3 months ago Skin tear of forearm without complication, left, sequela   [facility] Margarita Mail, DO   4 months ago Skin tear of forearm without complication, left, sequela   [name] Margarita Mail, DO       Future Appointments             In 2 weeks Gollan, Tollie Pizza, MD [facility] [place]   In 1 month Margarita Mail, DO [name], [facility]   In 2 months Deirdre Evener, MD [facility] [place] [facility]

## 2023-06-20 ENCOUNTER — Other Ambulatory Visit: Payer: Self-pay | Admitting: Internal Medicine

## 2023-06-20 DIAGNOSIS — M47816 Spondylosis without myelopathy or radiculopathy, lumbar region: Secondary | ICD-10-CM

## 2023-06-23 ENCOUNTER — Encounter: Payer: Self-pay | Admitting: Internal Medicine

## 2023-06-23 ENCOUNTER — Other Ambulatory Visit: Payer: Self-pay | Admitting: Internal Medicine

## 2023-06-23 DIAGNOSIS — M47816 Spondylosis without myelopathy or radiculopathy, lumbar region: Secondary | ICD-10-CM

## 2023-06-23 MED ORDER — DULOXETINE HCL 60 MG PO CPEP: 60.0000 mg | ORAL_CAPSULE | Freq: Every day | ORAL | 1 refills | Status: DC

## 2023-06-23 NOTE — Telephone Encounter (Signed)
Duplicate request, medication reordered today. Requested Prescriptions  Pending Prescriptions Disp Refills   DULoxetine (CYMBALTA) 60 MG capsule [Pharmacy Med Name: DULOXETINE HCL DR 60 MG CAP] 90 capsule 1    Sig: TAKE 1 CAPSULE BY MOUTH EVERY DAY     Psychiatry: Antidepressants - SNRI - duloxetine Passed - 06/20/2023  9:05 PM      Passed - Cr in normal range and within 360 days    Creat  Date Value Ref Range Status  04/28/2023 0.63 0.60 - 0.95 mg/dL Final         Passed - eGFR is 30 or above and within 360 days    GFR, Est African American  Date Value Ref Range Status  06/04/2020 96 > OR = 60 mL/min/1.52m2 Final   GFR, Est Non African American  Date Value Ref Range Status  06/04/2020 83 > OR = 60 mL/min/1.44m2 Final   eGFR  Date Value Ref Range Status  04/28/2023 87 > OR = 60 mL/min/1.100m2 Final         Passed - Completed PHQ-2 or PHQ-9 in the last 360 days      Passed - Last BP in normal range    BP Readings from Last 1 Encounters:  06/09/23 114/70         Passed - Valid encounter within last 6 months    Recent Outpatient Visits           2 weeks ago Upper respiratory tract infection, unspecified type   [facility] Margarita Mail, DO   1 month ago Annual physical exam   [facility] Margarita Mail, DO   3 months ago COVID-19   [facility] Cory, MD   4 months ago Skin tear of forearm without complication, left, sequela   [name] Margarita Mail, DO   5 months ago Skin tear of forearm without complication, left, sequela   [facility] Margarita Mail, DO       Future Appointments             In 6 days Gollan, Tollie Pizza, MD [facility] [place]   In 1 month Margarita Mail, DO [name]   In 1 month Deirdre Evener, MD [facility] [place] [facility]

## 2023-06-28 NOTE — Progress Notes (Unsigned)
Cardiology Office Note  Date:  06/29/2023   ID:  [name], DOB 1936/04/02, MRN [id number]  PCP:  Margarita Mail, DO   Chief Complaint  Patient presents with   6 month follow up     Pt recently diagnosed with Emphysema. Bilateral swelling in feet w/ more on the left and c/o SOB. Medications reviewed verbally with patient.    HPI:  [name] is a 87 year old woman with past medical history of Edema Hypertension COPD/asthma, nonsmoker Hyperlipidemia Records indicating chronic systolic CHF but no details available Who presents for follow-up of her chronic systolic CHF, palpitations  Last seen by myself in clinic February 2024 Sedentary, no exercise [employer], Electronics engineer at times Balance is poor  Previous falls, was at [employer] and fall, trauma to left forearm Seen in the emergency room, arm continued to bleed hemoglobin less than 10  Activity Limited secondary to arthritis, back Previously broke pelvis Declining knee surgery  Denies chest pain or shortness of breath on exertion Denies tachycardia or palpitations concerning for arrhythmia  Prior testing and imaging reviewed ZIO monitor March 2024 Short episodes of SVT Less than 1% atrial flutter, longest was 13 minutes  Echocardiogram October 06, 2022 EF 55 to 60% Mildly elevated right heart pressures, mild to moderate MS  EKG personally reviewed by myself on todays visit EKG Interpretation Date/Time:  Monday June 29 2023 14:50:58 EST Ventricular Rate:  86 PR Interval:  154 QRS Duration:  72 QT Interval:  374 QTC Calculation: 447 R Axis:   -29  Text Interpretation: Normal sinus rhythm Normal ECG When compared with ECG of 11-Feb-2018 04:01, No significant change was found Confirmed by Julien Nordmann [phone]) on 06/29/2023 2:56:53 PM   Other past medical history reviewed Holter monitor done through Dr. Juel Burrow following which she was started on Eliquis 2-day Holter reviewed by myself, no  clear indication of atrial fibrillation or flutter noted Reports she continues to have Paroxysmal tachycardia, rate up to 180 at home Less than 5 min Sometimes >20 min  "Told I had two heart attacks" [facility] July 2019 hospitalized with PNA,  Reports feet are chronically cold Normal ABIs with vascular October 2023 Minimal leg swelling  Holter: NSR, PVCs  PMH:   has a past medical history of Actinic keratosis, Acute thoracic back pain, Allergy, Anemia, Arthritis, Asthma, Basal cell carcinoma, Chest pain, CHF (congestive heart failure) (HCC) (04/30/2017), Chronic abdominal pain (05/01/2017), Congenital heart failure (HCC), COPD (chronic obstructive pulmonary disease) (HCC), Disease of upper respiratory system (04/30/2017), Dyspnea on exertion, Emphysema lung (HCC), Esophageal reflux disease (04/30/2017), History of bone density study (06/28/2004), Leg swelling, Lumbar arthropathy, Nasopharyngitis (04/30/2017), Neuromuscular disorder (HCC), Osteoarthropathy (04/30/2017), Osteoporosis (04/30/2017), Palpitations, Sinusitis, acute (04/30/2017), Squamous cell carcinoma of skin (07/28/2017), and Thyroid disease.  PSH:    Past Surgical History:  Procedure Laterality Date   CATARACT EXTRACTION     07/21/2001 - 07/20/2002   CHOLECYSTECTOMY     07/21/1997 - 07/20/1998   COLON SURGERY     COLONOSCOPY     05/05/2000, 01/17/2000 Adenomatous Polyps     COLONOSCOPY     11/05/2009, 12/23/2004, 07/07/2001   PH Adenomatous Polyps: CBF 10/2014; recall ltr mailed 09/18/2014 (dw)    ESOPHAGOGASTRODUODENOSCOPY     11/05/2009, 05/05/2000   EYE SURGERY     FLEXIBLE SIGMOIDOSCOPY  11/28/1999   HAND SURGERY  2006   HERNIA REPAIR     HIP ARTHROPLASTY Right 02/11/2018   Procedure: ARTHROPLASTY BIPOLAR HIP (HEMIARTHROPLASTY);  Surgeon: Leron Croak  J, MD;  Location: [facility] ORS;  Service: Orthopedics;  Laterality: Right;   JOINT REPLACEMENT     LAPAROSCOPIC COLON RESECTION     2001    Current Outpatient Medications   Medication Sig Dispense Refill   acetaminophen (TYLENOL 8 HOUR ARTHRITIS PAIN) 650 MG CR tablet      albuterol (VENTOLIN HFA) 108 (90 Base) MCG/ACT inhaler TAKE 2 PUFFS BY MOUTH EVERY 6 HOURS AS NEEDED FOR WHEEZE OR SHORTNESS OF BREATH 54 each 1   Ascorbic Acid (VITAMIN C WITH ROSE HIPS) 1000 MG tablet Take 1,000 mg by mouth daily.     benzonatate (TESSALON) 100 MG capsule Take 1 capsule (100 mg total) by mouth 2 (two) times daily as needed for cough. 20 capsule 0   cholecalciferol (VITAMIN D3) 25 MCG (1000 UNIT) tablet Take 1,000 Units by mouth daily.     DULoxetine (CYMBALTA) 60 MG capsule Take 1 capsule (60 mg total) by mouth daily. 90 capsule 1   ELIQUIS 5 MG TABS tablet TAKE 1 TABLET BY MOUTH TWICE A DAY 180 tablet 3   ezetimibe (ZETIA) 10 MG tablet Take 1 tablet (10 mg total) by mouth daily. 90 tablet 1   ferrous sulfate 325 (65 FE) MG tablet Take 325 mg by mouth daily with breakfast.     fluticasone (FLONASE) 50 MCG/ACT nasal spray SHAKE LIQUID AND USE 2 SPRAYS IN EACH NOSTRIL DAILY 48 g 6   fluticasone furoate-vilanterol (BREO ELLIPTA) 200-25 MCG/ACT AEPB Inhale 1 puff into the lungs daily. 28 each 4   levothyroxine (SYNTHROID) 50 MCG tablet TAKE 1 TABLET BY MOUTH EVERY DAY 90 tablet 3   metoprolol succinate (TOPROL-XL) 25 MG 24 hr tablet Take 1 tablet (25 mg total) by mouth daily. 90 tablet 3   pantoprazole (PROTONIX) 40 MG tablet Take 1 tablet (40 mg total) by mouth daily. 30 tablet 3   sacubitril-valsartan (ENTRESTO) 24-26 MG TAKE 1 TABLET BY MOUTH TWICE A DAY 180 tablet 0   traMADol (ULTRAM) 50 MG tablet      methylPREDNISolone (MEDROL DOSEPAK) 4 MG TBPK tablet Day 1: Take 8 mg (2 tablets) before breakfast, 4 mg (1 tablet) after lunch, 4 mg (1 tablet) after supper, and 8 mg (2 tablets) at bedtime. Day 2:Take 4 mg (1 tablet) before breakfast, 4 mg (1 tablet) after lunch, 4 mg (1 tablet) after supper, and 8 mg (2 tablets) at bedtime. Day 3: Take 4 mg (1 tablet) before breakfast, 4 mg (1  tablet) after lunch, 4 mg (1 tablet) after supper, and 4 mg (1 tablet) at bedtime. Day 4: Take 4 mg (1 tablet) before breakfast, 4 mg (1 tablet) after lunch, and 4 mg (1 tablet) at bedtime. Day 5: Take 4 mg (1 tablet) before breakfast and 4 mg (1 tablet) at bedtime. Day 6: Take 4 mg (1 tablet) before breakfast. (Patient not taking: Reported on 06/29/2023) 1 each 0   mupirocin ointment (BACTROBAN) 2 % Apply 1 Application topically daily. (Patient not taking: Reported on 06/29/2023) 22 g 0   No current facility-administered medications for this visit.    Allergies:   Codeine and Lidocaine   Social History:  The patient  reports that she has never smoked. She has never used smokeless tobacco. She reports that she does not currently use alcohol. She reports that she does not use drugs.   Family History:   family history includes Heart disease in her father and mother; Hypertension in her daughter and son.    Review of  Systems: Review of Systems  Constitutional: Negative.   HENT: Negative.    Respiratory: Negative.    Cardiovascular: Negative.   Gastrointestinal: Negative.   Musculoskeletal:  Positive for back pain and joint pain.  Neurological: Negative.   Psychiatric/Behavioral: Negative.    All other systems reviewed and are negative.   PHYSICAL EXAM: VS:  BP (!) 104/54 (BP Location: Left Arm, Patient Position: Sitting, Cuff Size: Normal)   Pulse 91   Ht 5\' 6"  (1.676 m)   Wt 145 lb 3.2 oz (65.9 kg)   SpO2 96%   BMI 23.44 kg/m  , BMI Body mass index is 23.44 kg/m. Constitutional:  oriented to person, place, and time. No distress.  HENT:  Head: Grossly normal Eyes:  no discharge. No scleral icterus.  Neck: No JVD, no carotid bruits  Cardiovascular: Regular rate and rhythm, no murmurs appreciated Pulmonary/Chest: Clear to auscultation bilaterally, no wheezes or rails Abdominal: Soft.  no distension.  no tenderness.  Musculoskeletal: Normal range of motion Neurological:  normal  muscle tone. Coordination normal. No atrophy Skin: Skin warm and dry Psychiatric: normal affect, pleasant  Recent Labs: 04/28/2023: ALT 12; BUN 15; Creat 0.63; Hemoglobin 12.9; Platelets 280; Potassium 4.5; Sodium 142; TSH 1.86   Lipid Panel Lab Results  Component Value Date   CHOL 196 06/04/2021   HDL 70 06/04/2021   LDLCALC 107 (H) 06/04/2021   TRIG 97 06/04/2021    Wt Readings from Last 3 Encounters:  06/29/23 145 lb 3.2 oz (65.9 kg)  06/09/23 144 lb 8 oz (65.5 kg)  04/28/23 143 lb 14.4 oz (65.3 kg)     ASSESSMENT AND PLAN:  Problem List Items Addressed This Visit       Cardiology Problems   Chronic venous insufficiency   CHF (congestive heart failure) (HCC)   Essential hypertension   Relevant Orders   EKG 12-Lead (Completed)   Chronic systolic (congestive) heart failure (HCC) - Primary   Relevant Orders   EKG 12-Lead (Completed)     Other   Lymphedema   Chronic obstructive pulmonary disease (HCC)   Relevant Orders   EKG 12-Lead (Completed)   Other Visit Diagnoses     Palpitations       Relevant Orders   EKG 12-Lead (Completed)      COPD/asthma On inhalers/Breo Reports breathing is stable  Lower extremity swelling Minimal lower extremity swelling No further workup needed, not on diuretics  Paroxysmal atrial fibrillation/flutter Zio monitor showing low burden atrial flutter [place] nsr On metoprolol and Eliquis 5 twice daily Does not meet criteria for reduced dose Eliquis   Signed, Dossie Arbour, M.D., Ph.D. [facility] [place], [place] [phone]

## 2023-06-29 ENCOUNTER — Ambulatory Visit: Payer: PPO | Attending: Cardiovascular Disease | Admitting: Cardiovascular Disease

## 2023-06-29 ENCOUNTER — Encounter: Payer: Self-pay | Admitting: Cardiovascular Disease

## 2023-06-29 VITALS — BP 104/54 | HR 91 | Ht 66.0 in | Wt 145.2 lb

## 2023-06-29 DIAGNOSIS — I872 Venous insufficiency (chronic) (peripheral): Secondary | ICD-10-CM

## 2023-06-29 DIAGNOSIS — R002 Palpitations: Secondary | ICD-10-CM

## 2023-06-29 DIAGNOSIS — J449 Chronic obstructive pulmonary disease, unspecified: Secondary | ICD-10-CM

## 2023-06-29 DIAGNOSIS — I5022 Chronic systolic (congestive) heart failure: Secondary | ICD-10-CM

## 2023-06-29 DIAGNOSIS — I890 Lymphedema, not elsewhere classified: Secondary | ICD-10-CM

## 2023-06-29 DIAGNOSIS — I10 Essential (primary) hypertension: Secondary | ICD-10-CM | POA: Diagnosis not present

## 2023-06-29 DIAGNOSIS — I5082 Biventricular heart failure: Secondary | ICD-10-CM

## 2023-06-29 MED ORDER — METOPROLOL SUCCINATE ER 25 MG PO TB24: 25.0000 mg | ORAL_TABLET | Freq: Every day | ORAL | 3 refills | Status: DC

## 2023-06-29 MED ORDER — ENTRESTO 24-26 MG PO TABS: 1.0000 | ORAL_TABLET | Freq: Two times a day (BID) | ORAL | 3 refills | Status: DC

## 2023-06-29 NOTE — Addendum Note (Signed)
Addended by: Jani Gravel on: 06/29/2023 03:22 PM   Modules accepted: Orders

## 2023-06-29 NOTE — Patient Instructions (Signed)

## 2023-07-20 ENCOUNTER — Other Ambulatory Visit: Payer: Self-pay | Admitting: Internal Medicine

## 2023-07-20 DIAGNOSIS — Z0000 Encounter for general adult medical examination without abnormal findings: Secondary | ICD-10-CM

## 2023-07-20 DIAGNOSIS — K219 Gastro-esophageal reflux disease without esophagitis: Secondary | ICD-10-CM

## 2023-07-24 NOTE — Telephone Encounter (Signed)
 [facility] Call to pharmacy needed to verify.

## 2023-07-24 NOTE — Telephone Encounter (Signed)
 Reordered 04/28/23 #30 3 RF  Requested Prescriptions  Refused Prescriptions Disp Refills   pantoprazole  (PROTONIX ) 40 MG tablet [Pharmacy Med Name: PANTOPRAZOLE  SOD DR 40 MG TAB] 90 tablet 1    Sig: TAKE 1 TABLET BY MOUTH EVERY DAY     Gastroenterology: Proton Pump Inhibitors Passed - 07/24/2023  3:14 PM      Passed - Valid encounter within last 12 months    Recent Outpatient Visits           1 month ago Upper respiratory tract infection, unspecified type   [facility] Bernardo Fend, DO   2 months ago Annual physical exam   [facility]ardo Fend, DO   4 months ago COVID-19   [facility] Glenard Mire, MD   5 months ago Skin tear of forearm without complication, left, sequela   [facility]ardo Fend, DO   6 months ago Skin tear of forearm without complication, left, sequela   [facility] Bernardo Fend, DO       Future Appointments             In 6 days Bernardo Fend, DO [name]   In 2 weeks Hester Alm BROCKS, MD [name] [place] [facility]

## 2023-07-30 ENCOUNTER — Other Ambulatory Visit: Payer: Self-pay

## 2023-07-30 ENCOUNTER — Ambulatory Visit (INDEPENDENT_AMBULATORY_CARE_PROVIDER_SITE_OTHER): Payer: PPO | Admitting: Internal Medicine

## 2023-07-30 ENCOUNTER — Encounter: Payer: Self-pay | Admitting: Internal Medicine

## 2023-07-30 VITALS — BP 128/76 | HR 98 | Temp 97.8°F | Resp 14 | Ht 66.0 in | Wt 147.6 lb

## 2023-07-30 DIAGNOSIS — I5082 Biventricular heart failure: Secondary | ICD-10-CM | POA: Diagnosis not present

## 2023-07-30 DIAGNOSIS — J449 Chronic obstructive pulmonary disease, unspecified: Secondary | ICD-10-CM | POA: Diagnosis not present

## 2023-07-30 DIAGNOSIS — E063 Autoimmune thyroiditis: Secondary | ICD-10-CM | POA: Diagnosis not present

## 2023-07-30 DIAGNOSIS — I4892 Unspecified atrial flutter: Secondary | ICD-10-CM | POA: Diagnosis not present

## 2023-07-30 DIAGNOSIS — K219 Gastro-esophageal reflux disease without esophagitis: Secondary | ICD-10-CM | POA: Diagnosis not present

## 2023-07-30 DIAGNOSIS — R262 Difficulty in walking, not elsewhere classified: Secondary | ICD-10-CM | POA: Diagnosis not present

## 2023-07-30 DIAGNOSIS — M1990 Unspecified osteoarthritis, unspecified site: Secondary | ICD-10-CM

## 2023-07-30 DIAGNOSIS — E785 Hyperlipidemia, unspecified: Secondary | ICD-10-CM

## 2023-07-30 DIAGNOSIS — I10 Essential (primary) hypertension: Secondary | ICD-10-CM

## 2023-07-30 MED ORDER — FLUTICASONE FUROATE-VILANTEROL 200-25 MCG/ACT IN AEPB: 1.0000 | INHALATION_SPRAY | Freq: Every day | RESPIRATORY_TRACT | 4 refills | Status: DC

## 2023-07-30 MED ORDER — PANTOPRAZOLE SODIUM 40 MG PO TBEC: 40.0000 mg | DELAYED_RELEASE_TABLET | Freq: Every day | ORAL | 1 refills | Status: DC

## 2023-07-30 NOTE — Assessment & Plan Note (Signed)
 Patient occasionally having palpitations still, following with Cardiology, note reviewed from 06/29/23, no changes made to medications.

## 2023-07-30 NOTE — Assessment & Plan Note (Signed)
 Stable, doing well with Breo and Albuterol, will refill.

## 2023-07-30 NOTE — Assessment & Plan Note (Signed)
 Blood pressure stable here today, no changes made to medications.

## 2023-07-30 NOTE — Progress Notes (Signed)
 Established Patient Office Visit  Subjective   Patient ID: Teresa Chambers, female    DOB: September 15, 1935  Age: 88 y.o. MRN: [id number]  Chief Complaint  Patient presents with   Medical Management of Chronic Issues    3 month recheck    HPI  Teresa Chambers presents to follow up.   Hypertension/CHF/A. Fib: -Follows with Cardiology, Dr. Gollan. Last seen 06/29/23 -Medications: Entresto , Eliquis  5 mg BID, Metoprolol  25 mg -Patient is compliant with above medications and reports no side effects. -Denies any SOB, CP, vision changes, LE edema or symptoms of hypotension  HLD: -Medications: Zetia  10 mg -Patient is compliant with above medications and reports no side effects.  -Last lipid panel: Lipid Panel     Component Value Date/Time   CHOL 196 06/04/2021 0910   TRIG 97 06/04/2021 0910   HDL 70 06/04/2021 0910   CHOLHDL 2.8 06/04/2021 0910   VLDL 16 03/29/2019 0951   LDLCALC 107 (H) 06/04/2021 0910   COPD: -COPD status: stable -Current medications: Breo Ellipta , Albuterol  PRN -Satisfied with current treatment?: yes -Oxygen use: no -Dyspnea frequency: rarely -Cough frequency: none -Rescue inhaler frequency:  none -Limitation of activity: no -Pneumovax: Up to Date -Influenza: Up to Date  Hypothyroidism: -Medications: Levothyroxine  50 mcg  -Patient is compliant with the above medication (s) at the above dose and reports no medication side effects.  -Denies weight changes, cold./heat intolerance, skin changes, anxiety/palpitations  -Last TSH: 10/24 1.86  GERD: -Had been on Protonix  40 mg, patient doing well and symptoms well controlled  Chronic Venous Insufficiency: -Follows with vascular, Dr. Marea, last seen 11/04/22  Arthritis/Lumbar Radiculopathy: -Currently on Cymbalta  60 mg. Lyrica  discontinued at LOV -Also on Tramadol  50 mg PRN but pain is not well controlled  Patient Active Problem List   Diagnosis Date Noted   Atrial flutter, unspecified type (HCC) 07/30/2023    Palpitation 05/02/2022   Cellulitis and abscess of lower extremity 02/20/2021   Raynaud's disease without gangrene 12/31/2020   Hypothyroidism due to Hashimoto's thyroiditis 06/11/2020   Callus of foot 05/28/2020   Essential hypertension 05/28/2020   Annual physical exam 05/28/2020   Major depression, recurrent, chronic (HCC) 04/09/2020   Pneumonia 11/10/2019   Sepsis without acute organ dysfunction (HCC) 11/10/2019   Leukocytosis 10/23/2019   Chronic systolic (congestive) heart failure (HCC) 10/22/2019   Closed fracture of multiple pubic rami, left, initial encounter (HCC) 10/22/2019   Closed fracture of proximal end of right humerus 01/10/2019   Chronic venous insufficiency 09/06/2018   Lymphedema 09/06/2018   GERD without esophagitis 03/10/2018   Bilateral dry eyes 03/10/2018   Chronic obstructive pulmonary disease (HCC) 03/04/2018   Acute exacerbation of COPD with asthma (HCC) 02/22/2018   Chronic anemia 02/22/2018   Chronic constipation 02/22/2018   Chronic bilateral thoracic back pain 02/22/2018   Closed displaced fracture of right femoral neck (HCC) 02/22/2018   CHF (congestive heart failure) (HCC)    Status post hip hemiarthroplasty 02/12/2018   Hip fracture (HCC) 02/11/2018   Asthma 04/30/2017   Chronic reflux esophagitis 04/30/2017   Dyslipidemia 04/30/2017   Osteoporosis 04/30/2017   Cardiac murmur, previously undiagnosed 04/30/2017   Diverticulosis of small intestine without hemorrhage 04/30/2017   Dyspnea on exertion 04/30/2017   Hx of adenomatous colonic polyps 04/30/2017   Hx of cardiomegaly 04/30/2017   Lumbar arthropathy 04/30/2017   Nasopharyngitis 04/30/2017   Nonspecific chest pain 04/30/2017   Osteoarthropathy 04/30/2017   Leg swelling 04/30/2017   Sinusitis, acute 04/30/2017   History  of bone density study 06/28/2004   Past Medical History:  Diagnosis Date   Actinic keratosis    Acute thoracic back pain    Allergy    Anemia    Arthritis     Asthma    Basal cell carcinoma    nose    Chest pain    unspecified   CHF (congestive heart failure) (HCC) 04/30/2017   Chronic abdominal pain 05/01/2017   unspecified   Congenital heart failure (HCC)    COPD (chronic obstructive pulmonary disease) (HCC)    Disease of upper respiratory system 04/30/2017   Dyspnea on exertion    Emphysema lung (HCC)    Esophageal reflux disease 04/30/2017   History of bone density study 06/28/2004   Leg swelling    Lumbar arthropathy    Nasopharyngitis 04/30/2017   Neuromuscular disorder (HCC)    Osteoarthropathy 04/30/2017   Osteoporosis 04/30/2017   Palpitations    Sinusitis, acute 04/30/2017   unspecified   Squamous cell carcinoma of skin 07/28/2017   Right medial knee. Hypertrophic SCCis. Tx: [facility] 07/28/2017   Thyroid  disease    Hashimoto disease   Past Surgical History:  Procedure Laterality Date   CATARACT EXTRACTION     07/21/2001 - 07/20/2002   CHOLECYSTECTOMY     07/21/1997 - 07/20/1998   COLON SURGERY     COLONOSCOPY     05/05/2000, 01/17/2000 Adenomatous Polyps     COLONOSCOPY     11/05/2009, 12/23/2004, 07/07/2001   PH Adenomatous Polyps: CBF 10/2014; recall ltr mailed 09/18/2014 (dw)    ESOPHAGOGASTRODUODENOSCOPY     11/05/2009, 05/05/2000   EYE SURGERY     FLEXIBLE SIGMOIDOSCOPY  11/28/1999   HAND SURGERY  2006   HERNIA REPAIR     HIP ARTHROPLASTY Right 02/11/2018   Procedure: ARTHROPLASTY BIPOLAR HIP (HEMIARTHROPLASTY);  Surgeon: Edie Norleen PARAS, MD;  Location: [facility] ORS;  Service: Orthopedics;  Laterality: Right;   JOINT REPLACEMENT     LAPAROSCOPIC COLON RESECTION     2001   Social History   Tobacco Use   Smoking status: Never   Smokeless tobacco: Never  Vaping Use   Vaping status: Never Used  Substance Use Topics   Alcohol use: Not Currently   Drug use: Never   Social History   Socioeconomic History   Marital status: Divorced    Spouse name: Not on file   Number of children: 5   Years of education: 15.5    Highest education level: Some college, no degree  Occupational History   Occupation: retired  Tobacco Use   Smoking status: Never   Smokeless tobacco: Never  Vaping Use   Vaping status: Never Used  Substance and Sexual Activity   Alcohol use: Not Currently   Drug use: Never   Sexual activity: Not Currently  Other Topics Concern   Not on file  Social History Narrative   Lives independently, has home at [facility] Teresa Chambers enjoys traveling to   Social Drivers of [employer] Strain: Low Risk  (07/26/2023)   Overall Financial Resource Strain (CARDIA)    Difficulty of Paying Living Expenses: Not hard at all  Food Insecurity: No Food Insecurity (07/26/2023)   Hunger Vital Sign    Worried About Running Out of Food in the Last Year: Never true    Ran Out of Food in the Last Year: Never true  Transportation Needs: No Transportation Needs (07/26/2023)   PRAPARE - Administrator, Civil Service (Medical): No  Lack of Transportation (Non-Medical): No  Physical Activity: Insufficiently Active (07/26/2023)   Exercise Vital Sign    Days of Exercise per Week: 2 days    Minutes of Exercise per Session: 10 min  Stress: No Stress Concern Present (07/26/2023)   [employer] of Occupational Health - Occupational Stress Questionnaire    Feeling of Stress : Not at all  Social Connections: Moderately Isolated (07/26/2023)   Social Connection and Isolation Panel [NHANES]    Frequency of Communication with Friends and Family: More than three times a week    Frequency of Social Gatherings with Friends and Family: Twice a week    Attends Religious Services: 1 to 4 times per year    Active Member of [employer] or Organizations: No    Attends Banker Meetings: Never    Marital Status: Divorced  Catering Manager Violence: Not At Risk (01/06/2023)   Received from [employer], [facility]   HITS    Over the last 12 months how often did your partner physically hurt you?:  Never    Over the last 12 months how often did your partner insult you or talk down to you?: Never    Over the last 12 months how often did your partner threaten you with physical harm?: Never    Over the last 12 months how often did your partner scream or curse at you?: Never   Family Status  Relation Name Status   Mother Landon Lunger Deceased   Father Rex Lunger Deceased   Son Ron (Not Specified)   Daughter Mliss (Not Specified)  No partnership data on file   Family History  Problem Relation Age of Onset   Heart disease Mother    Heart disease Father    Hypertension Son    Hypertension Daughter    Allergies  Allergen Reactions   Codeine Nausea And Vomiting   Lidocaine Swelling and Anaphylaxis      Review of Systems  All other systems reviewed and are negative.     Objective:     BP 128/76 (Cuff Size: Normal)   Pulse 98   Temp 97.8 F (36.6 C) (Oral)   Resp 14   Ht 5' 6 (1.676 m)   Wt 147 lb 9.6 oz (67 kg)   SpO2 99%   BMI 23.82 kg/m  BP Readings from Last 3 Encounters:  07/30/23 128/76  06/29/23 (!) 104/54  06/09/23 114/70   Wt Readings from Last 3 Encounters:  07/30/23 147 lb 9.6 oz (67 kg)  06/29/23 145 lb 3.2 oz (65.9 kg)  06/09/23 144 lb 8 oz (65.5 kg)      Physical Exam Constitutional:      Appearance: Normal appearance.  HENT:     Head: Normocephalic and atraumatic.     Mouth/Throat:     Mouth: Mucous membranes are moist.     Pharynx: Oropharynx is clear.  Eyes:     Extraocular Movements: Extraocular movements intact.     Conjunctiva/sclera: Conjunctivae normal.     Pupils: Pupils are equal, round, and reactive to light.  Neck:     Comments: No thyromegaly  Cardiovascular:     Rate and Rhythm: Normal rate and regular rhythm.  Pulmonary:     Effort: Pulmonary effort is normal.     Breath sounds: Normal breath sounds.  Musculoskeletal:     Cervical back: No tenderness.     Right lower leg: No edema.     Left lower leg: No edema.  Lymphadenopathy:     Cervical: No cervical adenopathy.  Skin:    General: Skin is warm and dry.  Neurological:     General: No focal deficit present.     Mental Status: Teresa Chambers is alert. Mental status is at baseline.  Psychiatric:        Mood and Affect: Mood normal.        Behavior: Behavior normal.      No results found for any visits on 07/30/23.  Last CBC Lab Results  Component Value Date   WBC 7.0 04/28/2023   HGB 12.9 04/28/2023   HCT 39.8 04/28/2023   MCV 98.0 04/28/2023   MCH 31.8 04/28/2023   RDW 13.2 04/28/2023   PLT 280 04/28/2023   Last metabolic panel Lab Results  Component Value Date   GLUCOSE 91 04/28/2023   NA 142 04/28/2023   K 4.5 04/28/2023   CL 103 04/28/2023   CO2 32 04/28/2023   BUN 15 04/28/2023   CREATININE 0.63 04/28/2023   EGFR 87 04/28/2023   CALCIUM  9.3 04/28/2023   PROT 6.1 04/28/2023   ALBUMIN 4.0 03/29/2019   BILITOT 0.4 04/28/2023   ALKPHOS 48 03/29/2019   AST 13 04/28/2023   ALT 12 04/28/2023   ANIONGAP 9 03/29/2019   Last lipids Lab Results  Component Value Date   CHOL 196 06/04/2021   HDL 70 06/04/2021   LDLCALC 107 (H) 06/04/2021   TRIG 97 06/04/2021   CHOLHDL 2.8 06/04/2021   Last hemoglobin A1c No results found for: HGBA1C Last thyroid  functions Lab Results  Component Value Date   TSH 1.86 04/28/2023   Last vitamin D  Lab Results  Component Value Date   VD25OH 56.8 06/27/2016   Last vitamin B12 and Folate Lab Results  Component Value Date   VITAMINB12 657 04/28/2023   FOLATE 13.3 04/28/2023      The ASCVD Risk score (Arnett DK, et al., 2019) failed to calculate for the following reasons:   The 2019 ASCVD risk score is only valid for ages 53 to 91    Assessment & Plan:  Essential hypertension Assessment & Plan: Blood pressure stable here today, no changes made to medications.   Biventricular congestive heart failure (HCC) Assessment & Plan: Currently on Entresto  and beta blocker, following with  Cardiology.    Atrial flutter, unspecified type [name]) Assessment & Plan: Patient occasionally having palpitations still, following with Cardiology, note reviewed from 06/29/23, no changes made to medications.    Dyslipidemia Assessment & Plan: Stable, on Zetia .    Hypothyroidism due to Hashimoto's thyroiditis Assessment & Plan: Well controlled, continue Levothyroxine  current dose.   GERD without esophagitis Assessment & Plan: Symptoms well controlled on Protonix  40 mg, refilled.  Orders: -     Pantoprazole  Sodium; Take 1 tablet (40 mg total) by mouth daily.  Dispense: 90 tablet; Refill: 1  Chronic obstructive pulmonary disease, unspecified COPD type (HCC) Assessment & Plan: Stable, doing well with Breo and Albuterol , will refill.   Orders: -     Fluticasone  Furoate-Vilanterol; Inhale 1 puff into the lungs daily.  Dispense: 28 each; Refill: 4  Osteoarthropathy Assessment & Plan: Patient having pain that is hard to control, on Cymbalta  60 mg and Tramadol  PRN but will place referral to home PT for strengthening exercises.   Orders: -     Ambulatory referral to Home Health  Ambulatory dysfunction -     Ambulatory referral to Home Health     Return in about 6 months (around 01/27/2024).  Sharyle Fischer, DO

## 2023-07-30 NOTE — Assessment & Plan Note (Signed)
 Stable, on Zetia.

## 2023-07-30 NOTE — Assessment & Plan Note (Signed)
 Well controlled, continue Levothyroxine current dose.

## 2023-07-30 NOTE — Assessment & Plan Note (Signed)
 Currently on Entresto and beta blocker, following with Cardiology.

## 2023-07-30 NOTE — Assessment & Plan Note (Signed)
 Symptoms well controlled on Protonix 40 mg, refilled.

## 2023-07-30 NOTE — Assessment & Plan Note (Signed)
 Patient having pain that is hard to control, on Cymbalta 60 mg and Tramadol PRN but will place referral to home PT for strengthening exercises.

## 2023-08-03 DIAGNOSIS — Z85828 Personal history of other malignant neoplasm of skin: Secondary | ICD-10-CM | POA: Diagnosis not present

## 2023-08-03 DIAGNOSIS — M4726 Other spondylosis with radiculopathy, lumbar region: Secondary | ICD-10-CM | POA: Diagnosis not present

## 2023-08-03 DIAGNOSIS — K219 Gastro-esophageal reflux disease without esophagitis: Secondary | ICD-10-CM | POA: Diagnosis not present

## 2023-08-03 DIAGNOSIS — I110 Hypertensive heart disease with heart failure: Secondary | ICD-10-CM | POA: Diagnosis not present

## 2023-08-03 DIAGNOSIS — E039 Hypothyroidism, unspecified: Secondary | ICD-10-CM | POA: Diagnosis not present

## 2023-08-03 DIAGNOSIS — I5022 Chronic systolic (congestive) heart failure: Secondary | ICD-10-CM | POA: Diagnosis not present

## 2023-08-03 DIAGNOSIS — F339 Major depressive disorder, recurrent, unspecified: Secondary | ICD-10-CM | POA: Diagnosis not present

## 2023-08-03 DIAGNOSIS — I4892 Unspecified atrial flutter: Secondary | ICD-10-CM | POA: Diagnosis not present

## 2023-08-03 DIAGNOSIS — I10 Essential (primary) hypertension: Secondary | ICD-10-CM | POA: Diagnosis not present

## 2023-08-03 DIAGNOSIS — I890 Lymphedema, not elsewhere classified: Secondary | ICD-10-CM | POA: Diagnosis not present

## 2023-08-03 DIAGNOSIS — J4489 Other specified chronic obstructive pulmonary disease: Secondary | ICD-10-CM | POA: Diagnosis not present

## 2023-08-03 DIAGNOSIS — Z7901 Long term (current) use of anticoagulants: Secondary | ICD-10-CM | POA: Diagnosis not present

## 2023-08-03 DIAGNOSIS — I872 Venous insufficiency (chronic) (peripheral): Secondary | ICD-10-CM | POA: Diagnosis not present

## 2023-08-03 DIAGNOSIS — Z9181 History of falling: Secondary | ICD-10-CM | POA: Diagnosis not present

## 2023-08-03 DIAGNOSIS — Z860101 Personal history of adenomatous and serrated colon polyps: Secondary | ICD-10-CM | POA: Diagnosis not present

## 2023-08-03 DIAGNOSIS — M810 Age-related osteoporosis without current pathological fracture: Secondary | ICD-10-CM | POA: Diagnosis not present

## 2023-08-03 DIAGNOSIS — I4891 Unspecified atrial fibrillation: Secondary | ICD-10-CM | POA: Diagnosis not present

## 2023-08-03 DIAGNOSIS — K5710 Diverticulosis of small intestine without perforation or abscess without bleeding: Secondary | ICD-10-CM | POA: Diagnosis not present

## 2023-08-03 DIAGNOSIS — I7300 Raynaud's syndrome without gangrene: Secondary | ICD-10-CM | POA: Diagnosis not present

## 2023-08-03 DIAGNOSIS — J439 Emphysema, unspecified: Secondary | ICD-10-CM | POA: Diagnosis not present

## 2023-08-03 DIAGNOSIS — M1990 Unspecified osteoarthritis, unspecified site: Secondary | ICD-10-CM | POA: Diagnosis not present

## 2023-08-03 DIAGNOSIS — E785 Hyperlipidemia, unspecified: Secondary | ICD-10-CM | POA: Diagnosis not present

## 2023-08-03 DIAGNOSIS — E063 Autoimmune thyroiditis: Secondary | ICD-10-CM | POA: Diagnosis not present

## 2023-08-03 DIAGNOSIS — I5082 Biventricular heart failure: Secondary | ICD-10-CM | POA: Diagnosis not present

## 2023-08-03 DIAGNOSIS — D649 Anemia, unspecified: Secondary | ICD-10-CM | POA: Diagnosis not present

## 2023-08-07 ENCOUNTER — Telehealth: Payer: Self-pay | Admitting: Internal Medicine

## 2023-08-07 NOTE — Telephone Encounter (Signed)
Home Health Verbal Orders - Caller/Agency: [facility] Callback Number: [phone]  Service Requested: Physical Therapy Frequency: 2 x 4 weeks 1 x 5 weeks Any new concerns about the patient? No

## 2023-08-13 ENCOUNTER — Ambulatory Visit: Payer: PPO | Admitting: Dermatology

## 2023-09-04 DIAGNOSIS — M5416 Radiculopathy, lumbar region: Secondary | ICD-10-CM | POA: Diagnosis not present

## 2023-09-04 DIAGNOSIS — M48062 Spinal stenosis, lumbar region with neurogenic claudication: Secondary | ICD-10-CM | POA: Diagnosis not present

## 2023-09-14 DIAGNOSIS — M170 Bilateral primary osteoarthritis of knee: Secondary | ICD-10-CM | POA: Diagnosis not present

## 2023-09-17 ENCOUNTER — Other Ambulatory Visit: Payer: Self-pay | Admitting: Internal Medicine

## 2023-09-17 DIAGNOSIS — J449 Chronic obstructive pulmonary disease, unspecified: Secondary | ICD-10-CM

## 2023-09-18 ENCOUNTER — Ambulatory Visit: Payer: Self-pay | Admitting: Internal Medicine

## 2023-09-18 NOTE — Telephone Encounter (Signed)
 Needs appt

## 2023-09-18 NOTE — Telephone Encounter (Signed)
 Copied from CRM [id number]. Topic: Clinical - Medication Question >> Sep 18, 2023 11:42 AM Patsy Lager T wrote: Reason for CRM: patient called to see if provider would call in a script for Cetirizine for her allergies. Please f/u with patient  09/18/23 1:19- 1st attempt, no asnwer, left vmail with callback to [facility]-[phone]

## 2023-09-18 NOTE — Telephone Encounter (Signed)
 Requested medication (s) are due for refill today:   See pharmacy note.   This is not covered under insurance  Alternative being requested  Requested medication (s) are on the active medication list:   N/A  Future visit scheduled:   N/A   Last ordered: 07/30/2023 by Dr. Caralee Ates   Requested Prescriptions  Pending Prescriptions Disp Refills   WIXELA INHUB 500-50 MCG/ACT AEPB [Pharmacy Med Name: Monte Fantasia 500-50 INHUB]  0     Pulmonology:  Combination Products Passed - 09/18/2023 11:27 AM      Passed - Valid encounter within last 12 months    Recent Outpatient Visits           1 month ago Essential hypertension   [facility] Margarita Mail, DO   3 months ago Upper respiratory tract infection, unspecified type   [facility] Margarita Mail, DO   4 months ago Annual physical exam   [facility] Margarita Mail, DO   6 months ago COVID-19   [facility] Alba Cory, MD   7 months ago Skin tear of forearm without complication, left, sequela   [facility] Margarita Mail, DO       Future Appointments             In 3 weeks Deirdre Evener, MD [facility]   In 4 months Margarita Mail, DO [facility]

## 2023-09-18 NOTE — Telephone Encounter (Signed)
 Copied from CRM [id number]. Topic: Clinical - Prescription Issue >> Sep 18, 2023  1:48 PM Antony Haste wrote: Reason for CRM: Patient received missed call from a triage nurse for her Cetrizine for allergies.   Chief Complaint: Seasonal Allergy Symptoms Symptoms: Runny nose, watery/itchy eyes Frequency: 1 week Pertinent Negatives: Patient denies difficulty breathing Disposition: [] ED /[] Urgent Care (no appt availability in office) / [] Appointment(In office/virtual)/ []  [facility]/ [] Home Care/ [x] Refused Recommended Disposition /[] [facility]/ []  Follow-up with PCP Additional Notes: Patient called and advised that she has had a runny nose and watery/itchy eyes for about a week now.  Patient states that she got a Cetrizine tablet from her daughter and that helped her better than over the counter Claritan so patient was wondering if she could get a prescription for Cetrizine. Patient also states that she didn't have any Flonase and she was contacted by her pharmacy saying that they were going to contact her PCP office for a substitute for Flonase. Patient is advised that with having these allergies year round it is recommended that she is seen within the next 2 weeks.  Patient states that she has an appointment with her PCP coming up.  This RN looked and that appointment is 3/26 and it is a follow up appointment.  Patient is advised that March 11th there's an opening with her provider to be seen for this concern.  She advised she would be out of town that whole week.  Patient also stated that she didn't want to see anyone else but her PCP.  Patient is advised that this message will be sent to her PCP and someone will get back in touch with her about either just sending in the prescriptions or setting up an appointment. Care Advice is given and patient is advised that if anything gets worse to go to the emergency room.  Patient verbalized understanding.  Reason for Disposition  [1]  Nasal allergies AND [2] year-round symptoms  Answer Assessment - Initial Assessment Questions 1. SYMPTOM: "What's the main symptom you're concerned about?" (e.g., runny nose, stuffiness, sneezing, itching)     Runny nose 2. SEVERITY: "How bad is it?" "What does it keep you from doing?" (e.g., sleeping, working)      Moderate 3. EYES: "Are the eyes also red, watery, and itchy?"      Yes 4. TRIGGER: "What pollen or other allergic substance do you think is causing the symptoms?"      Patient states seasonal allergies 5. TREATMENT: "What medicine are you using?" "What medicine worked best in the past?"     Certrizine that she got from her daughter worked well 6. OTHER SYMPTOMS: "Do you have any other symptoms?" (e.g., coughing, difficulty breathing, wheezing)     No  Protocols used: Nasal Allergies (Hay Fever)-A-AH

## 2023-09-21 DIAGNOSIS — M47816 Spondylosis without myelopathy or radiculopathy, lumbar region: Secondary | ICD-10-CM | POA: Diagnosis not present

## 2023-09-21 DIAGNOSIS — M5416 Radiculopathy, lumbar region: Secondary | ICD-10-CM | POA: Diagnosis not present

## 2023-09-21 DIAGNOSIS — M48062 Spinal stenosis, lumbar region with neurogenic claudication: Secondary | ICD-10-CM | POA: Diagnosis not present

## 2023-09-21 DIAGNOSIS — Z79899 Other long term (current) drug therapy: Secondary | ICD-10-CM | POA: Diagnosis not present

## 2023-09-22 NOTE — Telephone Encounter (Signed)
 Spoke with pt and she said that she is taking over the counter meds and and hoping that will work. Will call if is getting no better.

## 2023-09-23 ENCOUNTER — Other Ambulatory Visit: Payer: Self-pay | Admitting: Internal Medicine

## 2023-09-23 DIAGNOSIS — J449 Chronic obstructive pulmonary disease, unspecified: Secondary | ICD-10-CM

## 2023-09-24 ENCOUNTER — Other Ambulatory Visit: Payer: Self-pay | Admitting: Internal Medicine

## 2023-09-24 DIAGNOSIS — J449 Chronic obstructive pulmonary disease, unspecified: Secondary | ICD-10-CM

## 2023-09-24 MED ORDER — FLUTICASONE-SALMETEROL 500-50 MCG/ACT IN AEPB: 1.0000 | INHALATION_SPRAY | Freq: Two times a day (BID) | RESPIRATORY_TRACT | 1 refills | Status: DC

## 2023-09-24 NOTE — Telephone Encounter (Signed)
 Requested medication (s) are due for refill today:   Breo Ellipta not covered by insurance.  Requesting an alternative.   See pharm. message  Requested medication (s) are on the active medication list:   Monte Fantasia is not but Earlie Server is  Future visit scheduled:   Yes 7/10   Last ordered: 07/30/2023     Requested Prescriptions  Pending Prescriptions Disp Refills   WIXELA INHUB 500-50 MCG/ACT AEPB [Pharmacy Med Name: Monte Fantasia 500-50 INHUB]  0     Pulmonology:  Combination Products Passed - 09/24/2023 11:03 AM      Passed - Valid encounter within last 12 months    Recent Outpatient Visits           1 month ago Essential hypertension   [facility] Margarita Mail, DO   3 months ago Upper respiratory tract infection, unspecified type   [name] Margarita Mail, DO   4 months ago Annual physical exam   [facility] Margarita Mail, DO   6 months ago COVID-19   [facility] Alba Cory, MD   7 months ago Skin tear of forearm without complication, left, sequela   [facility] Margarita Mail, DO       Future Appointments             In 2 weeks Deirdre Evener, MD [facility]   In 4 months Margarita Mail, DO [facility]

## 2023-10-14 ENCOUNTER — Ambulatory Visit: Payer: PPO | Admitting: Dermatology

## 2023-11-03 ENCOUNTER — Encounter (INDEPENDENT_AMBULATORY_CARE_PROVIDER_SITE_OTHER): Payer: Self-pay | Admitting: Vascular Surgery

## 2023-11-03 ENCOUNTER — Ambulatory Visit (INDEPENDENT_AMBULATORY_CARE_PROVIDER_SITE_OTHER): Payer: PPO | Admitting: Vascular Surgery

## 2023-11-03 VITALS — BP 97/62 | HR 88 | Resp 18 | Ht 66.0 in | Wt 146.8 lb

## 2023-11-03 DIAGNOSIS — I5022 Chronic systolic (congestive) heart failure: Secondary | ICD-10-CM | POA: Diagnosis not present

## 2023-11-03 DIAGNOSIS — I10 Essential (primary) hypertension: Secondary | ICD-10-CM | POA: Diagnosis not present

## 2023-11-03 DIAGNOSIS — I890 Lymphedema, not elsewhere classified: Secondary | ICD-10-CM

## 2023-11-03 NOTE — Progress Notes (Signed)
 MRN : [id number]  Teresa Chambers is a 88 y.o. (1935/09/29) female who presents with chief complaint of  Chief Complaint  Patient presents with   Follow-up    f/u in 1 year with no studies -  .  History of Present Illness: Patient returns today in follow up of her leg swelling and lymphedema. Her swelling is generally well-controlled.  She has been elevating her legs a lot.  She does have some easy bruising and some purplish discoloration of her feet, but no open wounds, infection, or severe pain.  Overall doing quite well.  Current Outpatient Medications  Medication Sig Dispense Refill   acetaminophen (TYLENOL 8 HOUR ARTHRITIS PAIN) 650 MG CR tablet      albuterol (VENTOLIN HFA) 108 (90 Base) MCG/ACT inhaler TAKE 2 PUFFS BY MOUTH EVERY 6 HOURS AS NEEDED FOR WHEEZE OR SHORTNESS OF BREATH 54 each 1   Ascorbic Acid (VITAMIN C WITH ROSE HIPS) 1000 MG tablet Take 1,000 mg by mouth daily.     cholecalciferol (VITAMIN D3) 25 MCG (1000 UNIT) tablet Take 1,000 Units by mouth daily.     DULoxetine (CYMBALTA) 60 MG capsule Take 1 capsule (60 mg total) by mouth daily. 90 capsule 1   ELIQUIS 5 MG TABS tablet TAKE 1 TABLET BY MOUTH TWICE A DAY 180 tablet 3   ezetimibe (ZETIA) 10 MG tablet Take 1 tablet (10 mg total) by mouth daily. 90 tablet 1   ferrous sulfate 325 (65 FE) MG tablet Take 325 mg by mouth daily with breakfast.     fluticasone (FLONASE) 50 MCG/ACT nasal spray SHAKE LIQUID AND USE 2 SPRAYS IN EACH NOSTRIL DAILY 48 g 6   fluticasone-salmeterol (WIXELA INHUB) 500-50 MCG/ACT AEPB Inhale 1 puff into the lungs in the morning and at bedtime. 1 each 1   levothyroxine (SYNTHROID) 50 MCG tablet TAKE 1 TABLET BY MOUTH EVERY DAY 90 tablet 3   metoprolol succinate (TOPROL-XL) 25 MG 24 hr tablet Take 1 tablet (25 mg total) by mouth daily. 90 tablet 3   pantoprazole (PROTONIX) 40 MG tablet Take 1 tablet (40 mg total) by mouth daily. 90 tablet 1   sacubitril-valsartan (ENTRESTO) 24-26 MG Take 1 tablet  by mouth 2 (two) times daily. 180 tablet 3   traMADol (ULTRAM) 50 MG tablet      No current facility-administered medications for this visit.    Past Medical History:  Diagnosis Date   Actinic keratosis    Acute thoracic back pain    Allergy    Anemia    Arthritis    Asthma    Basal cell carcinoma    nose    Chest pain    unspecified   CHF (congestive heart failure) (HCC) 04/30/2017   Chronic abdominal pain 05/01/2017   unspecified   Congenital heart failure (HCC)    COPD (chronic obstructive pulmonary disease) (HCC)    Disease of upper respiratory system 04/30/2017   Dyspnea on exertion    Emphysema lung (HCC)    Esophageal reflux disease 04/30/2017   History of bone density study 06/28/2004   Leg swelling    Lumbar arthropathy    Nasopharyngitis 04/30/2017   Neuromuscular disorder (HCC)    Osteoarthropathy 04/30/2017   Osteoporosis 04/30/2017   Palpitations    Sinusitis, acute 04/30/2017   unspecified   Squamous cell carcinoma of skin 07/28/2017   Right medial knee. Hypertrophic SCCis. Tx: [facility] 07/28/2017   Thyroid disease    Hashimoto disease  Past Surgical History:  Procedure Laterality Date   CATARACT EXTRACTION     07/21/2001 - 07/20/2002   CHOLECYSTECTOMY     07/21/1997 - 07/20/1998   COLON SURGERY     COLONOSCOPY     05/05/2000, 01/17/2000 Adenomatous Polyps     COLONOSCOPY     11/05/2009, 12/23/2004, 07/07/2001   PH Adenomatous Polyps: CBF 10/2014; recall ltr mailed 09/18/2014 (dw)    ESOPHAGOGASTRODUODENOSCOPY     11/05/2009, 05/05/2000   EYE SURGERY     FLEXIBLE SIGMOIDOSCOPY  11/28/1999   HAND SURGERY  2006   HERNIA REPAIR     HIP ARTHROPLASTY Right 02/11/2018   Procedure: ARTHROPLASTY BIPOLAR HIP (HEMIARTHROPLASTY);  Surgeon: Christena Flake, MD;  Location: [facility] ORS;  Service: Orthopedics;  Laterality: Right;   JOINT REPLACEMENT     LAPAROSCOPIC COLON RESECTION     2001     Social History   Tobacco Use   Smoking status: Never   Smokeless  tobacco: Never  Vaping Use   Vaping status: Never Used  Substance Use Topics   Alcohol use: Not Currently   Drug use: Never       Family History  Problem Relation Age of Onset   Heart disease Mother    Heart disease Father    Hypertension Son    Hypertension Daughter      Allergies  Allergen Reactions   Codeine Nausea And Vomiting   Lidocaine Swelling and Anaphylaxis     REVIEW OF SYSTEMS (Negative unless checked) Constitutional: [] Weight loss  [] Fever  [] Chills Cardiac: [] Chest pain   [] Chest pressure   [x] Palpitations   [] Shortness of breath when laying flat   [] Shortness of breath at rest   [] Shortness of breath with exertion. Vascular:  [x] Pain in legs with walking   [x] Pain in legs at rest   [] Pain in legs when laying flat   [] Claudication   [] Pain in feet when walking  [] Pain in feet at rest  [] Pain in feet when laying flat   [] History of DVT   [] Phlebitis   [x] Swelling in legs   [] Varicose veins   [] Non-healing ulcers Pulmonary:   [] Uses home oxygen   [] Productive cough   [] Hemoptysis   [] Wheeze  [x] COPD   [x] Asthma Neurologic:  [] Dizziness  [] Blackouts   [] Seizures   [] History of stroke   [] History of TIA  [] Aphasia   [] Temporary blindness   [] Dysphagia   [] Weakness or numbness in arms   [] Weakness or numbness in legs Musculoskeletal:  [] Arthritis   [] Joint swelling   [] Joint pain   [] Low back pain Hematologic:  [x] Easy bruising  [] Easy bleeding   [] Hypercoagulable state   [] Anemic  [] Hepatitis Gastrointestinal:  [] Blood in stool   [] Vomiting blood  [] Gastroesophageal reflux/heartburn   [] Abdominal pain Genitourinary:  [] Chronic kidney disease   [] Difficult urination  [] Frequent urination  [] Burning with urination   [] Hematuria Skin:  [] Rashes   [] Ulcers   [] Wounds Psychological:  [] History of anxiety   []  History of major depression  Physical Examination  BP 97/62   Pulse 88   Resp 18   Ht 5\' 6"  (1.676 m)   Wt 146 lb 12.8 oz (66.6 kg)   BMI 23.69 kg/m  Gen:   WD/WN, NAD.  Appears younger than stated age Head: [place]/AT, No temporalis wasting. Ear/Nose/Throat: Hearing grossly intact, nares w/o erythema or drainage Eyes: Conjunctiva clear. Sclera non-icteric Neck: Supple.  Trachea midline Pulmonary:  Good air movement, no use of accessory muscles.  Cardiac: Irregular Vascular:  Vessel Right Left  Radial Palpable Palpable           Musculoskeletal: M/S 5/5 throughout.  No deformity or atrophy.  Stasis dermatitis changes are present, but there is minimal lower extremity swelling. Neurologic: Sensation grossly intact in extremities.  Symmetrical.  Speech is fluent.  Psychiatric: Judgment intact, Mood & affect appropriate for pt's clinical situation. Dermatologic: No rashes or ulcers noted.  No cellulitis or open wounds.      Labs No results found for this or any previous visit (from the past 2160 hours).  Radiology No results found.  Assessment/Plan  Congestive heart failure (HCC) Poor cardiac function certainly worsens and exacerbates lower extremity swelling type symptoms.  It sounds like her cardiologist has gotten her heart situation much better which has helped her legs.   Essential hypertension blood pressure control important in reducing the progression of atherosclerotic disease. On appropriate oral medications.   Lymphedema Symptoms are significantly improved from her initial and have not worsened since we saw her last year.  No major changes.  Continue current conservative measures.  Follow-up in 1 year.   Mikki Alexander, MD  11/03/2023 5:10 PM    This note was created with Dragon medical transcription system.  Any errors from dictation are purely unintentional

## 2023-11-13 ENCOUNTER — Other Ambulatory Visit: Payer: Self-pay | Admitting: Internal Medicine

## 2023-11-13 DIAGNOSIS — E785 Hyperlipidemia, unspecified: Secondary | ICD-10-CM

## 2023-11-13 NOTE — Telephone Encounter (Signed)
 Requested medications are due for refill today.  yes  Requested medications are on the active medications list.  yes  Last refill. 05/21/2023 #90 1 rf  Future visit scheduled.   yes  Notes to clinic.  Labs are expired.    Requested Prescriptions  Pending Prescriptions Disp Refills   ezetimibe  (ZETIA ) 10 MG tablet [Pharmacy Med Name: EZETIMIBE  10 MG TABLET] 90 tablet 1    Sig: TAKE 1 TABLET BY MOUTH EVERY DAY     Cardiovascular:  Antilipid - Sterol Transport Inhibitors Failed - 11/13/2023  2:11 PM      Failed - Valid encounter within last 12 months    Recent Outpatient Visits   None     Future Appointments             In 3 days Elta Halter, MD [facility]   In 2 months Rockney Cid, DO [place] [facility]            Failed - Lipid Panel in normal range within the last 12 months    Cholesterol  Date Value Ref Range Status  06/04/2021 196 <200 mg/dL Final   LDL Cholesterol (Calc)  Date Value Ref Range Status  06/04/2021 107 (H) mg/dL (calc) Final    Comment:    Reference range: <100 . Desirable range <100 mg/dL for primary prevention;   <70 mg/dL for patients with CHD or diabetic patients  with > or = 2 CHD risk factors. Aaron Aas LDL-C is now calculated using the Martin-Hopkins  calculation, which is a validated novel method providing  better accuracy than the Friedewald equation in the  estimation of LDL-C.  Melinda Sprawls et al. Erroll Heard. 4782;956(21): 2061-2068  (http://education.[employer].com/faq/FAQ164)    HDL  Date Value Ref Range Status  06/04/2021 70 > OR = 50 mg/dL Final   Triglycerides  Date Value Ref Range Status  06/04/2021 97 <150 mg/dL Final         Passed - AST in normal range and within 360 days    AST  Date Value Ref Range Status  04/28/2023 13 10 - 35 U/L Final         Passed - ALT in normal range and within 360 days    ALT  Date Value Ref Range Status  04/28/2023 12 6 - 29 U/L  Final         Passed - Patient is not pregnant

## 2023-11-16 ENCOUNTER — Ambulatory Visit: Admitting: Dermatology

## 2023-11-16 ENCOUNTER — Encounter: Payer: Self-pay | Admitting: Dermatology

## 2023-11-16 DIAGNOSIS — L821 Other seborrheic keratosis: Secondary | ICD-10-CM | POA: Diagnosis not present

## 2023-11-16 DIAGNOSIS — D1801 Hemangioma of skin and subcutaneous tissue: Secondary | ICD-10-CM

## 2023-11-16 DIAGNOSIS — Z1283 Encounter for screening for malignant neoplasm of skin: Secondary | ICD-10-CM

## 2023-11-16 DIAGNOSIS — D489 Neoplasm of uncertain behavior, unspecified: Secondary | ICD-10-CM

## 2023-11-16 DIAGNOSIS — W908XXA Exposure to other nonionizing radiation, initial encounter: Secondary | ICD-10-CM | POA: Diagnosis not present

## 2023-11-16 DIAGNOSIS — Z7189 Other specified counseling: Secondary | ICD-10-CM

## 2023-11-16 DIAGNOSIS — L814 Other melanin hyperpigmentation: Secondary | ICD-10-CM | POA: Diagnosis not present

## 2023-11-16 DIAGNOSIS — D492 Neoplasm of unspecified behavior of bone, soft tissue, and skin: Secondary | ICD-10-CM

## 2023-11-16 DIAGNOSIS — L820 Inflamed seborrheic keratosis: Secondary | ICD-10-CM

## 2023-11-16 DIAGNOSIS — D692 Other nonthrombocytopenic purpura: Secondary | ICD-10-CM

## 2023-11-16 DIAGNOSIS — Z8589 Personal history of malignant neoplasm of other organs and systems: Secondary | ICD-10-CM

## 2023-11-16 DIAGNOSIS — L578 Other skin changes due to chronic exposure to nonionizing radiation: Secondary | ICD-10-CM

## 2023-11-16 DIAGNOSIS — L570 Actinic keratosis: Secondary | ICD-10-CM | POA: Diagnosis not present

## 2023-11-16 DIAGNOSIS — D229 Melanocytic nevi, unspecified: Secondary | ICD-10-CM

## 2023-11-16 DIAGNOSIS — I771 Stricture of artery: Secondary | ICD-10-CM

## 2023-11-16 DIAGNOSIS — Z85828 Personal history of other malignant neoplasm of skin: Secondary | ICD-10-CM

## 2023-11-16 DIAGNOSIS — L719 Rosacea, unspecified: Secondary | ICD-10-CM | POA: Diagnosis not present

## 2023-11-16 NOTE — Progress Notes (Signed)
 Follow-Up Visit   Subjective  Teresa Chambers is a 88 y.o. female who presents for the following: Skin Cancer Screening and Full Body Skin Exam Spot at nose is concerned about and rough spot at back, bruising at arms  The patient presents for Total-Body Skin Exam (TBSE) for skin cancer screening and mole check. The patient has spots, moles and lesions to be evaluated, some may be new or changing and the patient may have concern these could be cancer.  The following portions of the chart were reviewed this encounter and updated as appropriate: medications, allergies, medical history  Review of Systems:  No other skin or systemic complaints except as noted in HPI or Assessment and Plan.  Objective  Well appearing patient in no apparent distress; mood and affect are within normal limits.  A full examination was performed including scalp, head, eyes, ears, nose, lips, neck, chest, axillae, abdomen, back, buttocks, bilateral upper extremities, bilateral lower extremities, hands, feet, fingers, toes, fingernails, and toenails. All findings within normal limits unless otherwise noted below.   Relevant physical exam findings are noted in the Assessment and Plan.  dorsum distal nose 0.8 cm x 0.5 cm pink patch   mid back spinal x 1 Erythematous stuck-on, waxy papule or plaque  Assessment & Plan   SKIN CANCER SCREENING PERFORMED TODAY.  ACTINIC DAMAGE - Chronic condition, secondary to cumulative UV/sun exposure - diffuse scaly erythematous macules with underlying dyspigmentation - Recommend daily broad spectrum sunscreen SPF 30+ to sun-exposed areas, reapply every 2 hours as needed.  - Staying in the shade or wearing long sleeves, sun glasses (UVA+UVB protection) and wide brim hats (4-inch brim around the entire circumference of the hat) are also recommended for sun protection.  - Call for new or changing lesions.  LENTIGINES, SEBORRHEIC KERATOSES, HEMANGIOMAS - Benign normal skin  lesions - Benign-appearing - Call for any changes  MELANOCYTIC NEVI - Tan-brown and/or pink-flesh-colored symmetric macules and papules - Benign appearing on exam today - Observation - Call clinic for new or changing moles - Recommend daily use of broad spectrum spf 30+ sunscreen to sun-exposed areas.   Purpura - Chronic; persistent and recurrent.  Treatable, but not curable. At arm  - Violaceous macules and patches - Benign - Related to trauma, age, sun damage and/or use of blood thinners, chronic use of topical and/or oral steroids - Observe - Can use OTC arnica containing moisturizer such as Dermend Bruise Formula if desired - Call for worsening or other concerns  History of Basal Cell Carcinoma of the Skin - No evidence of recurrence today - Recommend regular full body skin exams - Recommend daily broad spectrum sunscreen SPF 30+ to sun-exposed areas, reapply every 2 hours as needed.  - Call if any new or changing lesions are noted between office visits   History of Squamous Cell Carcinoma of the Skin - No evidence of recurrence today - No lymphadenopathy - Recommend regular full body skin exams - Recommend daily broad spectrum sunscreen SPF 30+ to sun-exposed areas, reapply every 2 hours as needed.  - Call if any new or changing lesions are noted between office visits  ROSACEA Exam Mid face erythema with telangiectasias  Rosacea is a chronic progressive skin condition usually affecting the face of adults, causing redness and/or acne bumps. It is treatable but not curable. It sometimes affects the eyes (ocular rosacea) as well. It may respond to topical and/or systemic medication and can flare with stress, sun exposure, alcohol, exercise, topical steroids (  including hydrocortisone/cortisone 10) and some foods.  Daily application of broad spectrum spf 30+ sunscreen to face is recommended to reduce flares.  Patient denies grittiness of the eyes Treatment Plan No Treatment at  this time   Arterial  Insufficiency of lower legs Exam: Feet and ankles purple. Treatment Plan: according to Dr. Vonna Guardian vascular surgeon  Follow-up with Dr. Vonna Guardian and primary care physician.  NEOPLASM OF UNCERTAIN BEHAVIOR dorsum distal nose Skin / nail biopsy Type of biopsy: tangential   Informed consent: discussed and consent obtained   Timeout: patient name, date of birth, surgical site, and procedure verified   Procedure prep:  Patient was prepped and draped in usual sterile fashion Prep type:  Isopropyl alcohol Anesthesia: the lesion was anesthetized in a standard fashion   Local anesthetic: 0.1 cc Diphenhydramine  HCI injection 50 mg/ml. Instrument used: flexible razor blade   Hemostasis achieved with: pressure, aluminum chloride and electrodesiccation   Outcome: patient tolerated procedure well   Post-procedure details: sterile dressing applied and wound care instructions given   Dressing type: petrolatum and bandage   Specimen 1 - Surgical pathology Differential Diagnosis: r/o bcc  Small specimen please use caution  Check Margins: No R/o bcc   Patient is allergic to lidocaine, injected with 0.1 cc Diphenhydramine  HCI injection 50 mg/ml   NDC [address]- 2 Lot : [id number] Exp 12/19/2023 INFLAMED SEBORRHEIC KERATOSIS mid back spinal x 1 Symptomatic, irritating, patient would like treated. Destruction of lesion - mid back spinal x 1 Complexity: simple   Destruction method: cryotherapy   Informed consent: discussed and consent obtained   Timeout:  patient name, date of birth, surgical site, and procedure verified Lesion destroyed using liquid nitrogen: Yes   Region frozen until ice ball extended beyond lesion: Yes   Outcome: patient tolerated procedure well with no complications   Post-procedure details: wound care instructions given   SKIN CANCER SCREENING   ACTINIC SKIN DAMAGE   LENTIGO   MELANOCYTIC NEVUS, UNSPECIFIED LOCATION   HISTORY OF BASAL CELL  CARCINOMA   HISTORY OF SQUAMOUS CELL CARCINOMA   PURPURA (HCC)   ROSACEA   COUNSELING AND COORDINATION OF CARE   Return in about 1 year (around 11/15/2024) for TBSE.  IRandee Busing, CMA, am acting as scribe for Celine Collard, MD.   Documentation: I have reviewed the above documentation for accuracy and completeness, and I agree with the above.  Celine Collard, MD

## 2023-11-16 NOTE — Patient Instructions (Addendum)

## 2023-11-19 ENCOUNTER — Ambulatory Visit (INDEPENDENT_AMBULATORY_CARE_PROVIDER_SITE_OTHER): Payer: Self-pay

## 2023-11-19 ENCOUNTER — Other Ambulatory Visit: Payer: Self-pay | Admitting: Internal Medicine

## 2023-11-19 VITALS — BP 100/58 | Ht 66.0 in | Wt 148.4 lb

## 2023-11-19 DIAGNOSIS — J449 Chronic obstructive pulmonary disease, unspecified: Secondary | ICD-10-CM

## 2023-11-19 DIAGNOSIS — Z0000 Encounter for general adult medical examination without abnormal findings: Secondary | ICD-10-CM | POA: Diagnosis not present

## 2023-11-19 NOTE — Progress Notes (Signed)
 Subjective:   Teresa Chambers is a 88 y.o. who presents for a Medicare Wellness preventive visit.  Visit Complete: In person  Persons Participating in Visit: Patient.  AWV Questionnaire: No: Patient Medicare AWV questionnaire was not completed prior to this visit.  Cardiac Risk Factors include: advanced age (>80men, >60 women);dyslipidemia;hypertension;sedentary lifestyle     Objective:    Today's Vitals   11/19/23 1342 11/19/23 1351  BP: (!) 100/58   Weight: 148 lb 6.4 oz (67.3 kg)   Height: 5\' 6"  (1.676 m)   PainSc:  4    Body mass index is 23.95 kg/m.     11/19/2023    1:59 PM 11/13/2022    1:39 PM 06/07/2021    2:54 PM 03/09/2018    2:18 PM 03/04/2018   10:52 AM 03/01/2018    3:36 PM 02/22/2018   11:22 AM  Advanced Directives  Does Patient Have a Medical Advance Directive? No Yes No Yes No No No  Type of Aeronautical engineer of [place];Living will     Does patient want to make changes to medical advance directive?    No - Patient declined No - Patient declined No - Patient declined No - Patient declined  Copy of Healthcare Power of Attorney in Chart?    No - copy requested     Would patient like information on creating a medical advance directive? No - Patient declined  No - Patient declined        Current Medications (verified) Outpatient Encounter Medications as of 11/19/2023  Medication Sig   acetaminophen  (TYLENOL  8 HOUR ARTHRITIS PAIN) 650 MG CR tablet    albuterol  (VENTOLIN  HFA) 108 (90 Base) MCG/ACT inhaler TAKE 2 PUFFS BY MOUTH EVERY 6 HOURS AS NEEDED FOR WHEEZE OR SHORTNESS OF BREATH   cholecalciferol (VITAMIN D3) 25 MCG (1000 UNIT) tablet Take 1,000 Units by mouth daily.   DULoxetine  (CYMBALTA ) 60 MG capsule Take 1 capsule (60 mg total) by mouth daily.   ELIQUIS  5 MG TABS tablet TAKE 1 TABLET BY MOUTH TWICE A DAY   ezetimibe  (ZETIA ) 10 MG tablet TAKE 1 TABLET BY MOUTH EVERY DAY   fluticasone  (FLONASE ) 50 MCG/ACT nasal spray SHAKE LIQUID AND  USE 2 SPRAYS IN EACH NOSTRIL DAILY   fluticasone -salmeterol (WIXELA INHUB) 500-50 MCG/ACT AEPB Inhale 1 puff into the lungs in the morning and at bedtime.   levothyroxine  (SYNTHROID ) 50 MCG tablet TAKE 1 TABLET BY MOUTH EVERY DAY   metoprolol  succinate (TOPROL -XL) 25 MG 24 hr tablet Take 1 tablet (25 mg total) by mouth daily.   pantoprazole  (PROTONIX ) 40 MG tablet Take 1 tablet (40 mg total) by mouth daily.   sacubitril -valsartan  (ENTRESTO ) 24-26 MG Take 1 tablet by mouth 2 (two) times daily.   traMADol  (ULTRAM ) 50 MG tablet    Ascorbic Acid (VITAMIN C WITH ROSE HIPS) 1000 MG tablet Take 1,000 mg by mouth daily. (Patient not taking: Reported on 11/19/2023)   ferrous sulfate 325 (65 FE) MG tablet Take 325 mg by mouth daily with breakfast. (Patient not taking: Reported on 11/19/2023)   No facility-administered encounter medications on file as of 11/19/2023.    Allergies (verified) Codeine and Lidocaine   History: Past Medical History:  Diagnosis Date   Actinic keratosis    Acute thoracic back pain    Allergy    Anemia    Arthritis    Asthma    Basal cell carcinoma    nose    Chest pain  unspecified   CHF (congestive heart failure) (HCC) 04/30/2017   Chronic abdominal pain 05/01/2017   unspecified   Congenital heart failure (HCC)    COPD (chronic obstructive pulmonary disease) (HCC)    Disease of upper respiratory system 04/30/2017   Dyspnea on exertion    Emphysema lung (HCC)    Esophageal reflux disease 04/30/2017   History of bone density study 06/28/2004   Leg swelling    Lumbar arthropathy    Nasopharyngitis 04/30/2017   Neuromuscular disorder (HCC)    Osteoarthropathy 04/30/2017   Osteoporosis 04/30/2017   Palpitations    Sinusitis, acute 04/30/2017   unspecified   Squamous cell carcinoma of skin 07/28/2017   Right medial knee. Hypertrophic SCCis. Tx: [name] 07/28/2017   Thyroid  disease    Hashimoto disease   Past Surgical History:  Procedure Laterality Date    CATARACT EXTRACTION     07/21/2001 - 07/20/2002   CHOLECYSTECTOMY     07/21/1997 - 07/20/1998   COLON SURGERY     COLONOSCOPY     05/05/2000, 01/17/2000 Adenomatous Polyps     COLONOSCOPY     11/05/2009, 12/23/2004, 07/07/2001   PH Adenomatous Polyps: [facility] 10/2014; recall ltr mailed 09/18/2014 (dw)    ESOPHAGOGASTRODUODENOSCOPY     11/05/2009, 05/05/2000   EYE SURGERY     FLEXIBLE SIGMOIDOSCOPY  11/28/1999   HAND SURGERY  2006   HERNIA REPAIR     HIP ARTHROPLASTY Right 02/11/2018   Procedure: ARTHROPLASTY BIPOLAR HIP (HEMIARTHROPLASTY);  Surgeon: Elner Hahn, MD;  Location: [facility] ORS;  Service: Orthopedics;  Laterality: Right;   JOINT REPLACEMENT     LAPAROSCOPIC COLON RESECTION     2001   Family History  Problem Relation Age of Onset   Heart disease Mother    Heart disease Father    Hypertension Son    Hypertension Daughter    Social History   Socioeconomic History   Marital status: Divorced    Spouse name: Not on file   Number of children: 5   Years of education: 15.5   Highest education level: Some college, no degree  Occupational History   Occupation: retired  Tobacco Use   Smoking status: Never   Smokeless tobacco: Never  Vaping Use   Vaping status: Never Used  Substance and Sexual Activity   Alcohol use: Not Currently   Drug use: Never   Sexual activity: Not Currently  Other Topics Concern   Not on file  Social History Narrative   Lives independently, has home at [facility] she enjoys traveling to   Social Drivers of [employer] Strain: Low Risk  (11/19/2023)   Overall Financial Resource Strain (CARDIA)    Difficulty of Paying Living Expenses: Not hard at all  Food Insecurity: No Food Insecurity (11/19/2023)   Hunger Vital Sign    Worried About Running Out of Food in the Last Year: Never true    Ran Out of Food in the Last Year: Never true  Transportation Needs: No Transportation Needs (11/19/2023)   PRAPARE - Scientist, research (physical sciences) (Medical): No    Lack of Transportation (Non-Medical): No  Physical Activity: Insufficiently Active (11/19/2023)   Exercise Vital Sign    Days of Exercise per Week: 3 days    Minutes of Exercise per Session: 10 min  Stress: No Stress Concern Present (11/19/2023)   [employer] of Occupational Health - Occupational Stress Questionnaire    Feeling of Stress : Only a little  Social Connections: Moderately Isolated (11/19/2023)   Social Connection and Isolation Panel [NHANES]    Frequency of Communication with Friends and Family: More than three times a week    Frequency of Social Gatherings with Friends and Family: More than three times a week    Attends Religious Services: Never    Database administrator or Organizations: Yes    Attends Engineer, structural: More than 4 times per year    Marital Status: Divorced    Tobacco Counseling Counseling given: Not Answered    Clinical Intake:  Pre-visit preparation completed: Yes  Pain : 0-10 Pain Score: 4  Pain Type: Chronic pain Pain Location: Back Pain Orientation: Lower Pain Descriptors / Indicators: Aching, Discomfort, Constant Pain Onset: More than a month ago Pain Frequency: Constant Pain Relieving Factors: TYLENOL , TRAMADOL   Pain Relieving Factors: TYLENOL , TRAMADOL   BMI - recorded: 23.95 Nutritional Status: BMI of 19-24  Normal Nutritional Risks: None Diabetes: No  No results found for: "HGBA1C"   How often do you need to have someone help you when you read instructions, pamphlets, or other written materials from your doctor or pharmacy?: 1 - Never  Interpreter Needed?: No  Information entered by :: Dellie Fergusson, LPN   Activities of Daily Living    11/19/2023    2:01 PM 11/13/2023   11:33 AM  In your present state of health, do you have any difficulty performing the following activities:  Hearing? 0 0  Vision? 0 0  Difficulty concentrating or making decisions? 0 0  Walking or climbing  stairs? 1 1  Dressing or bathing? 0 0  Doing errands, shopping? 0 0  Preparing Food and eating ? N N  Using the Toilet? N N  In the past six months, have you accidently leaked urine? Y Y  Do you have problems with loss of bowel control? Y Y  Managing your Medications? N N  Managing your Finances? N N  Housekeeping or managing your Housekeeping? N N    Patient Care Team: Rockney Cid, DO as PCP - General (Internal Medicine) Dingeldein, Landon Pinion, MD (Ophthalmology)  Indicate any recent Medical Services you may have received from other than [facility] providers in the past year (date may be approximate).     Assessment:   This is a routine wellness examination for [facility].  Hearing/Vision screen Hearing Screening - Comments:: NO AIDS Vision Screening - Comments:: [facility]- DR.DINGELDEIN- GOES ONCE PER YEAR   Goals Addressed             This Visit's Progress    DIET - EAT MORE FRUITS AND VEGETABLES         Depression Screen     11/19/2023    1:57 PM 06/09/2023    2:57 PM 04/28/2023   12:57 PM 03/09/2023    9:36 AM 02/20/2023    1:07 PM 01/15/2023    1:09 PM 11/13/2022    1:33 PM  PHQ 2/9 Scores  PHQ - 2 Score 0 0 0 0 0 0 1  PHQ- 9 Score 0 0 0 0 0      Fall Risk     11/19/2023    2:01 PM 11/13/2023   11:33 AM 06/09/2023    2:57 PM 04/28/2023   12:54 PM 03/09/2023    9:35 AM  Fall Risk   Falls in the past year? 1 1 0 1 1  Number falls in past yr: 0 0 0 0 0  Injury with Fall? 1 1 0  1 1  Risk for fall due to : History of fall(s);Impaired mobility    Impaired balance/gait  Follow up Falls prevention discussed;Falls evaluation completed    Falls prevention discussed    MEDICARE RISK AT HOME:  Medicare Risk at Home Any stairs in or around the home?: Yes If so, are there any without handrails?: No Home free of loose throw rugs in walkways, pet beds, electrical cords, etc?: Yes Adequate lighting in your home to reduce risk of falls?: Yes Life alert?: No Use of a cane, walker  or w/c?: Yes (CANE WHEN OUT) Grab bars in the bathroom?: Yes Shower chair or bench in shower?: Yes Elevated toilet seat or a handicapped toilet?: Yes  TIMED UP AND GO:  Was the test performed?  Yes  Length of time to ambulate 10 feet: 6 sec Gait slow and steady with assistive device  Cognitive Function: 6CIT completed    06/07/2021    2:55 PM  MMSE - Mini Mental State Exam  Not completed: Unable to complete        11/19/2023    2:03 PM 06/07/2021    2:55 PM  6CIT Screen  What Year? 0 points 0 points  What month? 0 points 0 points  What time? 0 points 0 points  Count back from 20 0 points 0 points  Months in reverse 0 points 0 points  Repeat phrase 2 points 0 points  Total Score 2 points 0 points    Immunizations Immunization History  Administered Date(s) Administered   Fluad Quad(high Dose 65+) 04/03/2021, 04/21/2022   Fluad Trivalent(High Dose 65+) 04/28/2023   Influenza Split 03/29/2019, 04/13/2019   Influenza-Unspecified 04/08/2018   PFIZER(Purple Top)SARS-COV-2 Vaccination 08/15/2019, 09/05/2019, 05/10/2020   PNEUMOCOCCAL CONJUGATE-20 09/19/2022   PPD Test 02/13/2018   Pneumococcal Conjugate-13 07/01/2013, 12/06/2019   Pneumococcal Polysaccharide-23 04/20/2015, 10/28/2019   Tdap 04/03/2021    Screening Tests Health Maintenance  Topic Date Due   Zoster Vaccines- Shingrix (1 of 2) Never done   COVID-19 Vaccine (4 - 2024-25 season) 03/22/2023   INFLUENZA VACCINE  02/19/2024   Medicare Annual Wellness (AWV)  11/18/2024   DTaP/Tdap/Td (2 - Td or Tdap) 04/04/2031   Pneumonia Vaccine 44+ Years old  Completed   DEXA SCAN  Completed   HPV VACCINES  Aged Out   Meningococcal B Vaccine  Aged Out    Health Maintenance  Health Maintenance Due  Topic Date Due   Zoster Vaccines- Shingrix (1 of 2) Never done   COVID-19 Vaccine (4 - 2024-25 season) 03/22/2023   Health Maintenance Items Addressed: UP TO DATE W/ Lazarus Primer AND MORE  COVIDS  Additional Screening:  Vision Screening: Recommended annual ophthalmology exams for early detection of glaucoma and other disorders of the eye.  Dental Screening: Recommended annual dental exams for proper oral hygiene  Community Resource Referral / Chronic Care Management: CRR required this visit?  No   CCM required this visit?  No     Plan:     I have personally reviewed and noted the following in the patient's chart:   Medical and social history Use of alcohol, tobacco or illicit drugs  Current medications and supplements including opioid prescriptions. Patient is not currently taking opioid prescriptions. Functional ability and status Nutritional status Physical activity Advanced directives List of other physicians Hospitalizations, surgeries, and ER visits in previous 12 months Vitals Screenings to include cognitive, depression, and falls Referrals and appointments  In addition, I have reviewed and discussed with patient certain  preventive protocols, quality metrics, and best practice recommendations. A written personalized care plan for preventive services as well as general preventive health recommendations were provided to patient.     Pinky Bright, LPN   12/23/7844   After Visit Summary: (In Person-Declined) Patient declined AVS at this time.  Notes: Nothing significant to report at this time.

## 2023-11-19 NOTE — Patient Instructions (Addendum)
 Teresa Chambers , Thank you for taking time to come for your Medicare Wellness Visit. I appreciate your ongoing commitment to your health goals. Please review the following plan we discussed and let me know if I can assist you in the future.   Referrals/Orders/Follow-Ups/Clinician Recommendations: NONE  This is a list of the screening recommended for you and due dates:  Health Maintenance  Topic Date Due   Zoster (Shingles) Vaccine (1 of 2) Never done   COVID-19 Vaccine (4 - 2024-25 season) 03/22/2023   Flu Shot  02/19/2024   Medicare Annual Wellness Visit  11/18/2024   DTaP/Tdap/Td vaccine (2 - Td or Tdap) 04/04/2031   Pneumonia Vaccine  Completed   DEXA scan (bone density measurement)  Completed   HPV Vaccine  Aged Out   Meningitis B Vaccine  Aged Out    Advanced directives: (ACP Link)Information on Advanced Care Planning can be found at [place]  Best boy Advance Health Care Directives Advance Health Care Directives. [email or web address]   Next Medicare Annual Wellness Visit scheduled for next year: Yes   12/01/24 @ 2:30 PM BY PHONE

## 2023-11-22 NOTE — Telephone Encounter (Signed)
 Requested Prescriptions  Pending Prescriptions Disp Refills   fluticasone -salmeterol (WIXELA INHUB) 500-50 MCG/ACT AEPB [Pharmacy Med Name: WIXELA 500-50 INHUB] 180 each 0    Sig: INHALE 1 PUFF INTO THE LUNGS IN THE MORNING AND AT BEDTIME.     Pulmonology:  Combination Products Passed - 11/22/2023  9:38 AM      Passed - Valid encounter within last 12 months    Recent Outpatient Visits   None     Future Appointments             In 2 months Rockney Cid, DO [facility]   In 11 months Elta Halter, MD [facility]

## 2023-11-23 LAB — SURGICAL PATHOLOGY

## 2023-11-24 ENCOUNTER — Telehealth: Payer: Self-pay

## 2023-11-24 ENCOUNTER — Encounter: Payer: Self-pay | Admitting: Dermatology

## 2023-11-24 NOTE — Telephone Encounter (Addendum)
 Called and discussed results with patient. Discussed 5-FU / Calcipotriene mix - Pharmacy that will deliver and how to use cream, what type of side of effects she may experience and to call or send mychart once she has finished cream treatment. Patient verbalized understanding and denied further questions.   Patient sent mychart with instruction on cream and pharmacy information.  ----- Message from Celine Collard sent at 11/24/2023 10:05 AM EDT ----- FINAL DIAGNOSIS        1. Skin, dorsum distal nose :       HYPERTROPHIC ACTINIC KERATOSIS   PreCancer Start 5-FU/Calcipotriene mix - send to Pharmacy and advise pt of Pharmacy etc. Apply bid to aa nose for 7 days. Call after finished to report reaction (Advise pt should get red and scaly) May need more than one round of treatment

## 2023-12-04 DIAGNOSIS — M48062 Spinal stenosis, lumbar region with neurogenic claudication: Secondary | ICD-10-CM | POA: Diagnosis not present

## 2023-12-04 DIAGNOSIS — M5416 Radiculopathy, lumbar region: Secondary | ICD-10-CM | POA: Diagnosis not present

## 2023-12-08 ENCOUNTER — Encounter (INDEPENDENT_AMBULATORY_CARE_PROVIDER_SITE_OTHER): Payer: Self-pay

## 2023-12-16 DIAGNOSIS — M170 Bilateral primary osteoarthritis of knee: Secondary | ICD-10-CM | POA: Diagnosis not present

## 2024-01-11 DIAGNOSIS — M48062 Spinal stenosis, lumbar region with neurogenic claudication: Secondary | ICD-10-CM | POA: Diagnosis not present

## 2024-01-11 DIAGNOSIS — M47816 Spondylosis without myelopathy or radiculopathy, lumbar region: Secondary | ICD-10-CM | POA: Diagnosis not present

## 2024-01-11 DIAGNOSIS — M5416 Radiculopathy, lumbar region: Secondary | ICD-10-CM | POA: Diagnosis not present

## 2024-01-18 ENCOUNTER — Emergency Department

## 2024-01-18 ENCOUNTER — Emergency Department
Admission: EM | Admit: 2024-01-18 | Discharge: 2024-01-18 | Disposition: A | Discharge status: Home / Self Care | Attending: Emergency Medicine | Admitting: Emergency Medicine

## 2024-01-18 DIAGNOSIS — I6782 Cerebral ischemia: Secondary | ICD-10-CM | POA: Diagnosis not present

## 2024-01-18 DIAGNOSIS — M25519 Pain in unspecified shoulder: Secondary | ICD-10-CM | POA: Diagnosis not present

## 2024-01-18 DIAGNOSIS — W01198A Fall on same level from slipping, tripping and stumbling with subsequent striking against other object, initial encounter: Secondary | ICD-10-CM | POA: Diagnosis not present

## 2024-01-18 DIAGNOSIS — S81811A Laceration without foreign body, right lower leg, initial encounter: Secondary | ICD-10-CM | POA: Diagnosis not present

## 2024-01-18 DIAGNOSIS — I10 Essential (primary) hypertension: Secondary | ICD-10-CM | POA: Insufficient documentation

## 2024-01-18 DIAGNOSIS — S0990XA Unspecified injury of head, initial encounter: Secondary | ICD-10-CM | POA: Diagnosis not present

## 2024-01-18 DIAGNOSIS — S01511A Laceration without foreign body of lip, initial encounter: Secondary | ICD-10-CM | POA: Insufficient documentation

## 2024-01-18 DIAGNOSIS — J449 Chronic obstructive pulmonary disease, unspecified: Secondary | ICD-10-CM | POA: Insufficient documentation

## 2024-01-18 DIAGNOSIS — M25511 Pain in right shoulder: Secondary | ICD-10-CM | POA: Diagnosis not present

## 2024-01-18 DIAGNOSIS — R58 Hemorrhage, not elsewhere classified: Secondary | ICD-10-CM | POA: Diagnosis not present

## 2024-01-18 DIAGNOSIS — S199XXA Unspecified injury of neck, initial encounter: Secondary | ICD-10-CM | POA: Diagnosis not present

## 2024-01-18 DIAGNOSIS — M79621 Pain in right upper arm: Secondary | ICD-10-CM | POA: Diagnosis not present

## 2024-01-18 DIAGNOSIS — S0993XA Unspecified injury of face, initial encounter: Secondary | ICD-10-CM | POA: Diagnosis present

## 2024-01-18 DIAGNOSIS — S81812A Laceration without foreign body, left lower leg, initial encounter: Secondary | ICD-10-CM | POA: Diagnosis not present

## 2024-01-18 DIAGNOSIS — I509 Heart failure, unspecified: Secondary | ICD-10-CM | POA: Diagnosis not present

## 2024-01-18 DIAGNOSIS — S41111A Laceration without foreign body of right upper arm, initial encounter: Secondary | ICD-10-CM | POA: Insufficient documentation

## 2024-01-18 DIAGNOSIS — M79629 Pain in unspecified upper arm: Secondary | ICD-10-CM | POA: Diagnosis not present

## 2024-01-18 DIAGNOSIS — S0181XA Laceration without foreign body of other part of head, initial encounter: Secondary | ICD-10-CM | POA: Diagnosis not present

## 2024-01-18 DIAGNOSIS — S41112A Laceration without foreign body of left upper arm, initial encounter: Secondary | ICD-10-CM | POA: Diagnosis not present

## 2024-01-18 DIAGNOSIS — M19011 Primary osteoarthritis, right shoulder: Secondary | ICD-10-CM | POA: Diagnosis not present

## 2024-01-18 DIAGNOSIS — M79603 Pain in arm, unspecified: Secondary | ICD-10-CM | POA: Diagnosis not present

## 2024-01-18 DIAGNOSIS — Z7901 Long term (current) use of anticoagulants: Secondary | ICD-10-CM | POA: Insufficient documentation

## 2024-01-18 MED ORDER — HYDROCODONE-ACETAMINOPHEN 5-325 MG PO TABS: 1.0000 | ORAL_TABLET | Freq: Four times a day (QID) | ORAL | 0 refills | Status: AC | PRN

## 2024-01-18 MED ORDER — FENTANYL CITRATE PF 50 MCG/ML IJ SOSY
25.0000 ug | PREFILLED_SYRINGE | Freq: Once | INTRAMUSCULAR | Status: AC
  Administered 2024-01-18: 25 ug via NASAL
  Filled 2024-01-18: qty 1

## 2024-01-18 NOTE — Discharge Instructions (Addendum)
 You were seen in the emergency department today for evaluation after fall.  Your testing fortunately did not show any emergency findings.  Your x-Jaymes Hang was concerning for possible rotator cuff injury, though this is not the ideal imaging to evaluate for that.  We repaired laceration of your face with sutures.  These will dissolve on their own.  Have included information for follow-up with orthopedics.  Return to the ER for new or worsening symptoms.

## 2024-01-18 NOTE — ED Triage Notes (Signed)
 Pt to ED via [facility] for a fall this morning. Pt is from home. Pt states that she tripped over her shoes and hit head on table. Pt denies LOC. PT takes eliquis . Reports pain in right shoulder, scattered skin tears, and small laceration to lip. Pt reports taking 2 tylenol  and 1 hydrocodone around 0745 this morning. A&O x 4.  BP 152/80 O2 99 HR 89

## 2024-01-18 NOTE — ED Notes (Signed)
 Assisted Ray, MD in laceration repair.

## 2024-01-18 NOTE — ED Provider Notes (Signed)
 [facility] Provider Note    Event Date/Time   First MD Initiated Contact with Patient 01/18/24 [phone]     (approximate)   History   Fall   HPI  Teresa Chambers is a 88 year old female with history of HTN, COPD, CHF, A-fib on Eliquis  presenting to the emergency department for evaluation after a fall.  Patient was at home when she tripped over her shoes and hit her head on a table.  No LOC.  Reports pain in her right shoulder.  Noted to have scattered skin tears over her extremities.  Does also have a laceration above her upper lip.  Denies preceding chest pain, shortness of breath, lightheadedness.     Physical Exam   Triage Vital Signs: ED Triage Vitals  Encounter Vitals Group     BP 01/18/24 0855 129/80     Girls Systolic BP Percentile --      Girls Diastolic BP Percentile --      Boys Systolic BP Percentile --      Boys Diastolic BP Percentile --      Pulse Rate 01/18/24 0855 92     Resp 01/18/24 0855 17     Temp 01/18/24 0855 98.2 F (36.8 C)     Temp Source 01/18/24 0855 Oral     SpO2 01/18/24 0855 100 %     Weight 01/18/24 0848 150 lb (68 kg)     Height 01/18/24 0848 5' 6 (1.676 m)     Head Circumference --      Peak Flow --      Pain Score 01/18/24 0847 5     Pain Loc --      Pain Education --      Exclude from Growth Chart --     Most recent vital signs: Vitals:   01/18/24 0900 01/18/24 1000  BP: 121/73 113/83  Pulse: 82 69  Resp: 18 17  Temp:    SpO2: 100% 100%    Nursing notes and vital signs reviewed.  General: Adult female, laying in bed, awake interactive Head: There is a 2 cm laceration over the upper lip that goes through the inner lip, extending into a 4cm laceration of the gums of the inner lip, likely caused by patient's tooth. Back: Midline pain Chest: Symmetric chest rise, no tenderness to palpation.  Cardiac: Regular rhythm and rate.  Respiratory: Lungs clear to auscultation Abdomen: Soft, nondistended. No  tenderness to palpation.  Pelvis: Stable in AP and lateral compression. No tenderness to palpation. MSK: No deformity to bilateral upper and lower extremity.  There is tenderness to palpation over the right shoulder into the right humerus.  No tenderness over the distal extremity.  Scattered skin tears throughout the bilateral upper and lower extremities.  Fully ranging left upper and bilateral lower extremities.  2+ radial pulses bilaterally. Neuro: Alert, oriented. GCS 15. Normal sensation to light touch in bilateral upper and lower extremity. Skin: No evidence of burns or lacerations.  ED Results / Procedures / Treatments   Labs (all labs ordered are listed, but only abnormal results are displayed) Labs Reviewed - No data to display   EKG EKG independently reviewed and interpreted by myself demonstrates:    RADIOLOGY Imaging independently reviewed and interpreted by myself demonstrates:  CT head without acute bleed CT C-spine without acute fracture Chest x-Rodric Punch without pneumothorax Humerus x-Graden Hoshino with healed fracture, also notable for high riding humerus, per radiology possibly reflective of complete rotator cuff tear  Formal  Radiology Read:  DG Humerus Right Result Date: 01/18/2024 CLINICAL DATA:  Fall.  Right shoulder pain. EXAM: RIGHT HUMERUS - 2+ VIEW COMPARISON:  None Available. FINDINGS: No acute fracture or dislocation. No aggressive osseous lesion. Glenohumeral and acromioclavicular joints are normal in alignment. Minimal osteoarthritis of the acromioclavicular joint. Moderate osteoarthritis of the glenohumeral joints. Note is made of high-riding right humerus in relation to the glenoid, suggesting complete rotator cuff tear. No soft tissue swelling. No radiopaque foreign bodies. IMPRESSION: *No acute osseous abnormality of the right humerus. Electronically Signed   By: Ree Molt M.D.   On: 01/18/2024 10:27   DG Chest Portable 1 View Result Date: 01/18/2024 CLINICAL DATA:   Fall.  Right shoulder pain. EXAM: PORTABLE CHEST 1 VIEW COMPARISON:  06/09/2023. FINDINGS: There are multiple calcifications in the left retrocardiac region, which are nonspecific and may represent nodal, lung parenchymal or soft tissue calcifications. Bilateral lung fields are otherwise clear. There is apparent blunting of left lateral costophrenic angle, which may be due to pleural thickening, pleural effusion or epicardial fat pad. Right lateral costophrenic angle is clear. Stable cardio-mediastinal silhouette. No acute osseous abnormalities. Old healed right proximal humerus fracture noted. Note is made of high riding right humerus in relation to glenoid, concerning for complete rotator cuff tear. The soft tissues are within normal limits. IMPRESSION: *No acute cardiopulmonary abnormality. *Multiple other nonacute observations, as described above. Electronically Signed   By: Ree Molt M.D.   On: 01/18/2024 10:25   CT Head Wo Contrast Result Date: 01/18/2024 CLINICAL DATA:  Head trauma, fall this morning, hit head on table. Patient on anticoagulation. EXAM: CT HEAD WITHOUT CONTRAST CT MAXILLOFACIAL WITHOUT CONTRAST CT CERVICAL SPINE WITHOUT CONTRAST TECHNIQUE: Multidetector CT imaging of the head, cervical spine, and maxillofacial structures were performed using the standard protocol without intravenous contrast. Multiplanar CT image reconstructions of the cervical spine and maxillofacial structures were also generated. RADIATION DOSE REDUCTION: This exam was performed according to the departmental dose-optimization program which includes automated exposure control, adjustment of the mA and/or kV according to patient size and/or use of iterative reconstruction technique. COMPARISON:  None Available. FINDINGS: CT HEAD FINDINGS Brain: No acute intracranial hemorrhage. No CT evidence of acute infarct. Nonspecific hypoattenuation in the periventricular and subcortical white matter favored to reflect chronic  microvascular ischemic changes. No edema, mass effect, or midline shift. The basilar cisterns are patent. Ventricles: Prominence of the ventricles suggesting underlying parenchymal volume loss. Vascular: No hyperdense vessel or unexpected calcification. Skull: No acute or aggressive finding. Other: Mastoid air cells are clear. CT MAXILLOFACIAL FINDINGS Osseous: No acute fracture or mandibular dislocation. Subtle irregularity of the right nasal bone without overlying soft tissue swelling. No suspicious osseous lesion. Degenerative changes of the mandibular condyle slightly greater on the left. Orbits: Globes are intact. Lenses are normally located. Extraocular muscles and optic nerve sheath complexes are unremarkable. Normal appearance of the orbital fat. Sinuses: Mild mucosal thickening in the ethmoid sinuses. No air-fluid levels. Soft tissues: Negative. CT CERVICAL SPINE FINDINGS Alignment: Alignment is maintained. No significant listhesis. No facet subluxation or dislocation. Skull base and vertebrae: No acute fracture. No primary bone lesion or focal pathologic process. Soft tissues and spinal canal: No prevertebral fluid or swelling. No visible canal hematoma. Disc levels: Intervertebral disc space narrowing most pronounced at C5-6 and C6-7. Small disc bulges at multiple levels. No high-grade osseous spinal canal stenosis. Facet arthrosis and uncovertebral hypertrophy at multiple levels. Severe foraminal narrowing on the right at C4-5 and on the  left at C5-6. Upper chest: Negative. Other: None. IMPRESSION: No CT evidence of acute intracranial abnormality. No acute fracture or traumatic malalignment of the cervical spine. No acute maxillofacial fracture. Chronic appearing deformity of the right nasal bone without overlying soft tissue swelling. Electronically Signed   By: Donnice Mania M.D.   On: 01/18/2024 10:24   CT Cervical Spine Wo Contrast Result Date: 01/18/2024 CLINICAL DATA:  Head trauma, fall this  morning, hit head on table. Patient on anticoagulation. EXAM: CT HEAD WITHOUT CONTRAST CT MAXILLOFACIAL WITHOUT CONTRAST CT CERVICAL SPINE WITHOUT CONTRAST TECHNIQUE: Multidetector CT imaging of the head, cervical spine, and maxillofacial structures were performed using the standard protocol without intravenous contrast. Multiplanar CT image reconstructions of the cervical spine and maxillofacial structures were also generated. RADIATION DOSE REDUCTION: This exam was performed according to the departmental dose-optimization program which includes automated exposure control, adjustment of the mA and/or kV according to patient size and/or use of iterative reconstruction technique. COMPARISON:  None Available. FINDINGS: CT HEAD FINDINGS Brain: No acute intracranial hemorrhage. No CT evidence of acute infarct. Nonspecific hypoattenuation in the periventricular and subcortical white matter favored to reflect chronic microvascular ischemic changes. No edema, mass effect, or midline shift. The basilar cisterns are patent. Ventricles: Prominence of the ventricles suggesting underlying parenchymal volume loss. Vascular: No hyperdense vessel or unexpected calcification. Skull: No acute or aggressive finding. Other: Mastoid air cells are clear. CT MAXILLOFACIAL FINDINGS Osseous: No acute fracture or mandibular dislocation. Subtle irregularity of the right nasal bone without overlying soft tissue swelling. No suspicious osseous lesion. Degenerative changes of the mandibular condyle slightly greater on the left. Orbits: Globes are intact. Lenses are normally located. Extraocular muscles and optic nerve sheath complexes are unremarkable. Normal appearance of the orbital fat. Sinuses: Mild mucosal thickening in the ethmoid sinuses. No air-fluid levels. Soft tissues: Negative. CT CERVICAL SPINE FINDINGS Alignment: Alignment is maintained. No significant listhesis. No facet subluxation or dislocation. Skull base and vertebrae: No  acute fracture. No primary bone lesion or focal pathologic process. Soft tissues and spinal canal: No prevertebral fluid or swelling. No visible canal hematoma. Disc levels: Intervertebral disc space narrowing most pronounced at C5-6 and C6-7. Small disc bulges at multiple levels. No high-grade osseous spinal canal stenosis. Facet arthrosis and uncovertebral hypertrophy at multiple levels. Severe foraminal narrowing on the right at C4-5 and on the left at C5-6. Upper chest: Negative. Other: None. IMPRESSION: No CT evidence of acute intracranial abnormality. No acute fracture or traumatic malalignment of the cervical spine. No acute maxillofacial fracture. Chronic appearing deformity of the right nasal bone without overlying soft tissue swelling. Electronically Signed   By: Donnice Mania M.D.   On: 01/18/2024 10:24   CT Maxillofacial Wo Contrast Result Date: 01/18/2024 CLINICAL DATA:  Head trauma, fall this morning, hit head on table. Patient on anticoagulation. EXAM: CT HEAD WITHOUT CONTRAST CT MAXILLOFACIAL WITHOUT CONTRAST CT CERVICAL SPINE WITHOUT CONTRAST TECHNIQUE: Multidetector CT imaging of the head, cervical spine, and maxillofacial structures were performed using the standard protocol without intravenous contrast. Multiplanar CT image reconstructions of the cervical spine and maxillofacial structures were also generated. RADIATION DOSE REDUCTION: This exam was performed according to the departmental dose-optimization program which includes automated exposure control, adjustment of the mA and/or kV according to patient size and/or use of iterative reconstruction technique. COMPARISON:  None Available. FINDINGS: CT HEAD FINDINGS Brain: No acute intracranial hemorrhage. No CT evidence of acute infarct. Nonspecific hypoattenuation in the periventricular and subcortical white matter favored to reflect  chronic microvascular ischemic changes. No edema, mass effect, or midline shift. The basilar cisterns are  patent. Ventricles: Prominence of the ventricles suggesting underlying parenchymal volume loss. Vascular: No hyperdense vessel or unexpected calcification. Skull: No acute or aggressive finding. Other: Mastoid air cells are clear. CT MAXILLOFACIAL FINDINGS Osseous: No acute fracture or mandibular dislocation. Subtle irregularity of the right nasal bone without overlying soft tissue swelling. No suspicious osseous lesion. Degenerative changes of the mandibular condyle slightly greater on the left. Orbits: Globes are intact. Lenses are normally located. Extraocular muscles and optic nerve sheath complexes are unremarkable. Normal appearance of the orbital fat. Sinuses: Mild mucosal thickening in the ethmoid sinuses. No air-fluid levels. Soft tissues: Negative. CT CERVICAL SPINE FINDINGS Alignment: Alignment is maintained. No significant listhesis. No facet subluxation or dislocation. Skull base and vertebrae: No acute fracture. No primary bone lesion or focal pathologic process. Soft tissues and spinal canal: No prevertebral fluid or swelling. No visible canal hematoma. Disc levels: Intervertebral disc space narrowing most pronounced at C5-6 and C6-7. Small disc bulges at multiple levels. No high-grade osseous spinal canal stenosis. Facet arthrosis and uncovertebral hypertrophy at multiple levels. Severe foraminal narrowing on the right at C4-5 and on the left at C5-6. Upper chest: Negative. Other: None. IMPRESSION: No CT evidence of acute intracranial abnormality. No acute fracture or traumatic malalignment of the cervical spine. No acute maxillofacial fracture. Chronic appearing deformity of the right nasal bone without overlying soft tissue swelling. Electronically Signed   By: Donnice Mania M.D.   On: 01/18/2024 10:24    PROCEDURES:  Critical Care performed: No  .Laceration Repair  Date/Time: 01/18/2024 11:05 AM  Performed by: Levander Slate, MD Authorized by: Levander Slate, MD   Consent:    Consent obtained:   Verbal   Consent given by:  Patient   Risks, benefits, and alternatives were discussed: yes   Universal protocol:    Patient identity confirmed:  Verbally with patient Anesthesia:    Anesthesia method: ice, anaphylaxis to lidocaine. Laceration details:    Location:  Lip   Lip location:  Upper lip, full thickness   Vermilion border involved: no (superior to vermillion border)     Length (cm):  4 (4 cm interior, 2 cm exterior) Exploration:    Hemostasis achieved with:  Direct pressure   Imaging outcome: foreign body not noted   Treatment:    Area cleansed with:  Saline   Amount of cleaning:  Standard   Irrigation solution:  Sterile saline   Irrigation method:  Syringe   Visualized foreign bodies/material removed: no   Skin repair:    Repair method:  Sutures   Suture size:  6-0   Wound skin closure material used: vicryl.   Suture technique:  Simple interrupted   Number of sutures:  6 (4 interior, 2 exterior) Approximation:    Approximation:  Close Repair type:    Repair type:  Intermediate Post-procedure details:    Dressing:  Open (no dressing)   Procedure completion:  Tolerated    MEDICATIONS ORDERED IN ED: Medications  fentaNYL  (SUBLIMAZE ) injection 25 mcg (25 mcg Nasal Given 01/18/24 1024)     IMPRESSION / MDM / ASSESSMENT AND PLAN / ED COURSE  I reviewed the triage vital signs and the nursing notes.  Differential diagnosis includes, but is not limited to, intracranial bleed, spine fracture, no evidence of thoracoabdominal trauma, shoulder fracture, dislocation, soft tissue injury  Patient's presentation is most consistent with acute presentation with potential threat to life or  bodily function.  88 year old female presenting to the emergency department for evaluation after a fall.  Will obtain CT head, C-spine in the setting of head trauma, x-rays of chest and humerus.  Will require laceration repair.  CT imaging fortunately without significant traumatic injury.   Healed humerus fracture with questionable rotator cuff injury of unknown acuity.  Patient placed in a sling.  Given information for follow-up with orthopedics.  Laceration repaired as above.  Tetanus up-to-date.  Patient is comfortable with discharge home.  Strict return precautions provided.  Patient discharged in stable condition.        FINAL CLINICAL IMPRESSION(S) / ED DIAGNOSES   Final diagnoses:  Closed head injury, initial encounter  Acute pain of right shoulder  Facial laceration, initial encounter     Rx / DC Orders   ED Discharge Orders     None        Note:  This document was prepared using Dragon voice recognition software and may include unintentional dictation errors.   Levander Slate, MD 01/18/24 [phone]

## 2024-01-18 NOTE — ED Notes (Signed)
 Cleaned skin tears with sterile water and CHG soap. Placed xeroform, telfa, and curlix on both legs and right arm. Pt tolerated well.

## 2024-01-19 ENCOUNTER — Telehealth: Payer: Self-pay

## 2024-01-19 NOTE — Telephone Encounter (Signed)
 Copied from CRM [id number]. Topic: Clinical - Home Health Verbal Orders >> Jan 19, 2024  3:19 PM Rosaria E wrote: Pt called requesting home health orders to come and change her dressing following her ED visit for her fall.   Says she wants [facility] home health again, does not want to come want to come in for an appt.   Best contact: [phone]

## 2024-01-21 ENCOUNTER — Other Ambulatory Visit: Payer: Self-pay | Admitting: Internal Medicine

## 2024-01-21 ENCOUNTER — Telehealth: Admitting: Internal Medicine

## 2024-01-21 ENCOUNTER — Other Ambulatory Visit: Payer: Self-pay

## 2024-01-21 ENCOUNTER — Encounter: Payer: Self-pay | Admitting: Internal Medicine

## 2024-01-21 DIAGNOSIS — S46011D Strain of muscle(s) and tendon(s) of the rotator cuff of right shoulder, subsequent encounter: Secondary | ICD-10-CM | POA: Diagnosis not present

## 2024-01-21 DIAGNOSIS — T148XXA Other injury of unspecified body region, initial encounter: Secondary | ICD-10-CM

## 2024-01-21 DIAGNOSIS — W010XXD Fall on same level from slipping, tripping and stumbling without subsequent striking against object, subsequent encounter: Secondary | ICD-10-CM | POA: Diagnosis not present

## 2024-01-21 DIAGNOSIS — S80211D Abrasion, right knee, subsequent encounter: Secondary | ICD-10-CM | POA: Diagnosis not present

## 2024-01-21 DIAGNOSIS — S0181XS Laceration without foreign body of other part of head, sequela: Secondary | ICD-10-CM

## 2024-01-21 DIAGNOSIS — S40819D Abrasion of unspecified upper arm, subsequent encounter: Secondary | ICD-10-CM | POA: Diagnosis not present

## 2024-01-21 DIAGNOSIS — S0181XD Laceration without foreign body of other part of head, subsequent encounter: Secondary | ICD-10-CM | POA: Diagnosis not present

## 2024-01-21 DIAGNOSIS — S80212D Abrasion, left knee, subsequent encounter: Secondary | ICD-10-CM | POA: Diagnosis not present

## 2024-01-21 NOTE — Telephone Encounter (Signed)
 Please make appointment can be virtual so we can do rferral

## 2024-01-21 NOTE — Telephone Encounter (Signed)
 Can this be virtual or do she need to come in

## 2024-01-21 NOTE — Progress Notes (Signed)
 Virtual Visit via Video Note  I connected with Teresa Chambers on 01/21/24 at  2:20 PM EDT by a video enabled telemedicine application and verified that I am speaking with the correct person using two identifiers.  Location: Patient: Home Provider: [facility]   I discussed the limitations of evaluation and management by telemedicine and the availability of in person appointments. The patient expressed understanding and agreed to proceed.  History of Present Illness:  Discussed the use of AI scribe software for clinical note transcription with the patient, who gave verbal consent to proceed.  History of Present Illness Teresa Chambers is an 88 year old female who presents with injuries sustained from a fall.  She tripped and fell while walking from the kitchen to the living room, resulting in a puncture wound in her gum, a skinned arm, and gashes on her knees. She received four resorbable stitches inside her lip and two outside. Significant pain is present in her right arm, which is painful to move, with a previous report suggesting a possible rotator cuff tear. An x-ray was performed at the ER, and she believes the arm is bruised. Her knees are swollen, with a bandage on one knee that becomes loose with movement. She is on blood thinners, which may contribute to drainage from her wounds, described as 'watered down blood'. No pus or signs of infection are present, and she has not experienced any fevers. Her son has been dressing her wounds, but she requires assistance as he is leaving.   Observations/Objective:  General: no acute distress ENT: conjunctiva normal appearing bilaterally, large hematoma and skin tear over philtrum of lip Skin: multiple skin tears and hematomas on arms and legs  Assessment and Plan: Assessment & Plan Fall with multiple injuries Sustained multiple injuries from a fall, including a tooth puncture through the gum, skin abrasions on the arm, and gashes on the knees.  Received resorbable stitches for a lip laceration. No signs of infection, but on anticoagulants, which may contribute to serosanguinous drainage. Home health referral planned for wound care and dressing changes. - Refer to home health for wound care and dressing changes. - Advise to keep wounds clean, dry, and covered. - Instruct to monitor for signs of infection and seek care if needed. - Consider visiting a walk-in clinic for dressing changes if home health is delayed.  Suspected complete rotator cuff tear Experiencing pain and limited arm mobility post-fall. ER evaluation suggested a possible complete rotator cuff tear. Prefers non-surgical management. Physical therapy recommended to maintain range of motion and prevent functional decline. - Refer to physical therapy for shoulder range of motion exercises.  Follow-up Scheduled follow-up on July 10th for reassessment and further management of injuries. - Attend follow-up appointment on July 10th.   Follow Up Instructions: already scheduled    I discussed the assessment and treatment plan with the patient. The patient was provided an opportunity to ask questions and all were answered. The patient agreed with the plan and demonstrated an understanding of the instructions.   The patient was advised to call back or seek an in-person evaluation if the symptoms worsen or if the condition fails to improve as anticipated.  I provided 10 minutes of non-face-to-face time during this encounter.   Sharyle Fischer, DO

## 2024-01-21 NOTE — Telephone Encounter (Signed)
 Pt called back for follow up on request, transferred to [facility] per pt request.   Best contact: [phone]

## 2024-01-26 ENCOUNTER — Other Ambulatory Visit: Payer: Self-pay | Admitting: Internal Medicine

## 2024-01-26 DIAGNOSIS — K219 Gastro-esophageal reflux disease without esophagitis: Secondary | ICD-10-CM

## 2024-01-27 DIAGNOSIS — E785 Hyperlipidemia, unspecified: Secondary | ICD-10-CM | POA: Diagnosis not present

## 2024-01-27 DIAGNOSIS — J439 Emphysema, unspecified: Secondary | ICD-10-CM | POA: Diagnosis not present

## 2024-01-27 DIAGNOSIS — Z7901 Long term (current) use of anticoagulants: Secondary | ICD-10-CM | POA: Diagnosis not present

## 2024-01-27 DIAGNOSIS — Z79899 Other long term (current) drug therapy: Secondary | ICD-10-CM | POA: Diagnosis not present

## 2024-01-27 DIAGNOSIS — J4489 Other specified chronic obstructive pulmonary disease: Secondary | ICD-10-CM | POA: Diagnosis not present

## 2024-01-27 DIAGNOSIS — I110 Hypertensive heart disease with heart failure: Secondary | ICD-10-CM | POA: Diagnosis not present

## 2024-01-27 DIAGNOSIS — I5022 Chronic systolic (congestive) heart failure: Secondary | ICD-10-CM | POA: Diagnosis not present

## 2024-01-27 DIAGNOSIS — I890 Lymphedema, not elsewhere classified: Secondary | ICD-10-CM | POA: Diagnosis not present

## 2024-01-27 DIAGNOSIS — S51811D Laceration without foreign body of right forearm, subsequent encounter: Secondary | ICD-10-CM | POA: Diagnosis not present

## 2024-01-27 DIAGNOSIS — S81012D Laceration without foreign body, left knee, subsequent encounter: Secondary | ICD-10-CM | POA: Diagnosis not present

## 2024-01-27 DIAGNOSIS — I872 Venous insufficiency (chronic) (peripheral): Secondary | ICD-10-CM | POA: Diagnosis not present

## 2024-01-27 DIAGNOSIS — M810 Age-related osteoporosis without current pathological fracture: Secondary | ICD-10-CM | POA: Diagnosis not present

## 2024-01-27 DIAGNOSIS — S81011D Laceration without foreign body, right knee, subsequent encounter: Secondary | ICD-10-CM | POA: Diagnosis not present

## 2024-01-27 DIAGNOSIS — D649 Anemia, unspecified: Secondary | ICD-10-CM | POA: Diagnosis not present

## 2024-01-27 DIAGNOSIS — E063 Autoimmune thyroiditis: Secondary | ICD-10-CM | POA: Diagnosis not present

## 2024-01-27 DIAGNOSIS — M4726 Other spondylosis with radiculopathy, lumbar region: Secondary | ICD-10-CM | POA: Diagnosis not present

## 2024-01-27 DIAGNOSIS — I4892 Unspecified atrial flutter: Secondary | ICD-10-CM | POA: Diagnosis not present

## 2024-01-27 DIAGNOSIS — F339 Major depressive disorder, recurrent, unspecified: Secondary | ICD-10-CM | POA: Diagnosis not present

## 2024-01-27 DIAGNOSIS — S0181XD Laceration without foreign body of other part of head, subsequent encounter: Secondary | ICD-10-CM | POA: Diagnosis not present

## 2024-01-27 DIAGNOSIS — I7300 Raynaud's syndrome without gangrene: Secondary | ICD-10-CM | POA: Diagnosis not present

## 2024-01-27 DIAGNOSIS — K2100 Gastro-esophageal reflux disease with esophagitis, without bleeding: Secondary | ICD-10-CM | POA: Diagnosis not present

## 2024-01-27 DIAGNOSIS — K5909 Other constipation: Secondary | ICD-10-CM | POA: Diagnosis not present

## 2024-01-27 DIAGNOSIS — I4891 Unspecified atrial fibrillation: Secondary | ICD-10-CM | POA: Diagnosis not present

## 2024-01-27 DIAGNOSIS — M1990 Unspecified osteoarthritis, unspecified site: Secondary | ICD-10-CM | POA: Diagnosis not present

## 2024-01-28 ENCOUNTER — Ambulatory Visit: Payer: Self-pay | Admitting: Internal Medicine

## 2024-01-28 ENCOUNTER — Other Ambulatory Visit: Payer: Self-pay

## 2024-01-28 ENCOUNTER — Encounter: Payer: Self-pay | Admitting: Internal Medicine

## 2024-01-28 VITALS — BP 132/74 | HR 102 | Resp 16 | Ht 66.0 in | Wt 147.3 lb

## 2024-01-28 DIAGNOSIS — R296 Repeated falls: Secondary | ICD-10-CM

## 2024-01-28 DIAGNOSIS — J449 Chronic obstructive pulmonary disease, unspecified: Secondary | ICD-10-CM | POA: Diagnosis not present

## 2024-01-28 DIAGNOSIS — T148XXA Other injury of unspecified body region, initial encounter: Secondary | ICD-10-CM | POA: Diagnosis not present

## 2024-01-28 DIAGNOSIS — K219 Gastro-esophageal reflux disease without esophagitis: Secondary | ICD-10-CM | POA: Diagnosis not present

## 2024-01-28 DIAGNOSIS — M47816 Spondylosis without myelopathy or radiculopathy, lumbar region: Secondary | ICD-10-CM

## 2024-01-28 MED ORDER — DULOXETINE HCL 60 MG PO CPEP: 60.0000 mg | ORAL_CAPSULE | Freq: Every day | ORAL | 1 refills | Status: DC

## 2024-01-28 MED ORDER — FLUTICASONE FUROATE-VILANTEROL 200-25 MCG/ACT IN AEPB: 1.0000 | INHALATION_SPRAY | Freq: Every day | RESPIRATORY_TRACT | 5 refills | Status: DC

## 2024-01-28 NOTE — Progress Notes (Signed)
 Established Patient Office Visit  Subjective   Patient ID: Teresa Chambers, female    DOB: Sep 07, 1935  Age: 88 y.o. MRN: [id number]  Chief Complaint  Patient presents with   Medical Management of Chronic Issues    6 month recheck   Fall    Fall   ALEXXUS SO[facility] presents to follow up.   Discussed the use of AI scribe software for clinical note transcription with the patient, who gave verbal consent to proceed.  History of Present Illness Teresa Chambers is an 88 year old female who presents with extensive bruising and swelling following a fall.  She fell while transitioning from linoleum to carpet, resulting in extensive bruising and swelling from her toes to her arms. The most significant swelling and pain are in her arms and knee. Bending her arm causes the wound to tear open more. She also has a cut lip and severe bruising on her arm. Her knee is sore in a specific area but not swollen.  She uses pantoprazole  and duloxetine  for nerve pain, taken in the morning. She has tried gabapentin without relief and avoids Lyrica  due to potential dizziness, which could increase her fall risk.  She lives in a townhouse in [place], equipped with grab bars in the bathroom. She has difficulty using her arms to push up due to pain. A commode riser was previously used but discarded. She is not affected by recent flooding due to [place] location [place].  She uses Wixela for respiratory issues, requiring twice-daily dosing. She attempted to switch to Breo, but insurance issues have complicated this. She is open to trying Ranell again, as it is covered by her insurance.   Hypertension/CHF/A. Fib: -Follows with Cardiology -Medications: Entresto , Eliquis  5 mg BID, Metoprolol  25 mg -Patient is compliant with above medications and reports no side effects. -Denies any SOB, CP, vision changes, LE edema or symptoms of hypotension  HLD: -Medications: Zetia  10 mg -Patient is compliant with above  medications and reports no side effects.  -Last lipid panel: Lipid Panel     Component Value Date/Time   CHOL 196 06/04/2021 0910   TRIG 97 06/04/2021 0910   HDL 70 06/04/2021 0910   CHOLHDL 2.8 06/04/2021 0910   VLDL 16 03/29/2019 0951   LDLCALC 107 (H) 06/04/2021 0910   COPD: -COPD status: stable -Current medications: Breo Ellipta , Albuterol  PRN -Satisfied with current treatment?: yes -Oxygen use: no -Dyspnea frequency: rarely -Cough frequency: none -Rescue inhaler frequency:  none -Limitation of activity: no -Pneumovax: Up to Date -Influenza: Up to Date  Hypothyroidism: -Medications: Levothyroxine  50 mcg  -Patient is compliant with the above medication (s) at the above dose and reports no medication side effects.  -Denies weight changes, cold./heat intolerance, skin changes, anxiety/palpitations  -Last TSH: 10/24 1.86  GERD: -Had been on Protonix  40 mg, patient doing well and symptoms well controlled  Chronic Venous Insufficiency: -Follows with vascular  Arthritis/Lumbar Radiculopathy: -Currently on Cymbalta  60 mg. Lyrica  discontinued at LOV -Also on Tramadol  50 mg PRN but pain is not well controlled  Patient Active Problem List   Diagnosis Date Noted   Atrial flutter, unspecified type (HCC) 07/30/2023   Palpitation 05/02/2022   Cellulitis and abscess of lower extremity 02/20/2021   Raynaud's disease without gangrene 12/31/2020   Hypothyroidism due to Hashimoto's thyroiditis 06/11/2020   Callus of foot 05/28/2020   Essential hypertension 05/28/2020   Annual physical exam 05/28/2020   Major depression, recurrent, chronic (HCC) 04/09/2020   Pneumonia  11/10/2019   Sepsis without acute organ dysfunction (HCC) 11/10/2019   Leukocytosis 10/23/2019   Chronic systolic (congestive) heart failure (HCC) 10/22/2019   Closed fracture of multiple pubic rami, left, initial encounter (HCC) 10/22/2019   Closed fracture of proximal end of right humerus 01/10/2019   Chronic  venous insufficiency 09/06/2018   Lymphedema 09/06/2018   GERD without esophagitis 03/10/2018   Bilateral dry eyes 03/10/2018   Chronic obstructive pulmonary disease (HCC) 03/04/2018   Acute exacerbation of COPD with asthma (HCC) 02/22/2018   Chronic anemia 02/22/2018   Chronic constipation 02/22/2018   Chronic bilateral thoracic back pain 02/22/2018   Closed displaced fracture of right femoral neck (HCC) 02/22/2018   CHF (congestive heart failure) (HCC)    Status post hip hemiarthroplasty 02/12/2018   Hip fracture (HCC) 02/11/2018   Asthma 04/30/2017   Chronic reflux esophagitis 04/30/2017   Dyslipidemia 04/30/2017   Osteoporosis 04/30/2017   Cardiac murmur, previously undiagnosed 04/30/2017   Diverticulosis of small intestine without hemorrhage 04/30/2017   Dyspnea on exertion 04/30/2017   Hx of adenomatous colonic polyps 04/30/2017   Hx of cardiomegaly 04/30/2017   Lumbar arthropathy 04/30/2017   Nasopharyngitis 04/30/2017   Nonspecific chest pain 04/30/2017   Osteoarthropathy 04/30/2017   Leg swelling 04/30/2017   Sinusitis, acute 04/30/2017   History of bone density study 06/28/2004   Past Medical History:  Diagnosis Date   Actinic keratosis    Acute thoracic back pain    Allergy    Anemia    Arthritis    Asthma    Basal cell carcinoma    nose    Chest pain    unspecified   CHF (congestive heart failure) (HCC) 04/30/2017   Chronic abdominal pain 05/01/2017   unspecified   Congenital heart failure (HCC)    COPD (chronic obstructive pulmonary disease) (HCC)    Disease of upper respiratory system 04/30/2017   Dyspnea on exertion    Emphysema lung (HCC)    Esophageal reflux disease 04/30/2017   History of bone density study 06/28/2004   Leg swelling    Lumbar arthropathy    Nasopharyngitis 04/30/2017   Neuromuscular disorder (HCC)    Osteoarthropathy 04/30/2017   Osteoporosis 04/30/2017   Palpitations    Sinusitis, acute 04/30/2017   unspecified    Squamous cell carcinoma of skin 07/28/2017   Right medial knee. Hypertrophic SCCis. Tx: [facility] 07/28/2017   Thyroid  disease    Hashimoto disease   Past Surgical History:  Procedure Laterality Date   CATARACT EXTRACTION     07/21/2001 - 07/20/2002   CHOLECYSTECTOMY     07/21/1997 - 07/20/1998   COLON SURGERY     COLONOSCOPY     05/05/2000, 01/17/2000 Adenomatous Polyps     COLONOSCOPY     11/05/2009, 12/23/2004, 07/07/2001   PH Adenomatous Polyps: CBF 10/2014; recall ltr mailed 09/18/2014 (dw)    ESOPHAGOGASTRODUODENOSCOPY     11/05/2009, 05/05/2000   EYE SURGERY     FLEXIBLE SIGMOIDOSCOPY  11/28/1999   HAND SURGERY  2006   HERNIA REPAIR     HIP ARTHROPLASTY Right 02/11/2018   Procedure: ARTHROPLASTY BIPOLAR HIP (HEMIARTHROPLASTY);  Surgeon: Edie Norleen PARAS, MD;  Location: [facility] ORS;  Service: Orthopedics;  Laterality: Right;   JOINT REPLACEMENT     LAPAROSCOPIC COLON RESECTION     2001   Social History   Tobacco Use   Smoking status: Never   Smokeless tobacco: Never  Vaping Use   Vaping status: Never Used  Substance Use Topics  Alcohol use: Not Currently   Drug use: Never   Social History   Socioeconomic History   Marital status: Divorced    Spouse name: Not on file   Number of children: 5   Years of education: 15.5   Highest education level: Some college, no degree  Occupational History   Occupation: retired  Tobacco Use   Smoking status: Never   Smokeless tobacco: Never  Vaping Use   Vaping status: Never Used  Substance and Sexual Activity   Alcohol use: Not Currently   Drug use: Never   Sexual activity: Not Currently  Other Topics Concern   Not on file  Social History Narrative   Lives independently, has home at [facility] she enjoys traveling to   Social Drivers of [employer] Strain: Low Risk  (01/24/2024)   Overall Financial Resource Strain (CARDIA)    Difficulty of Paying Living Expenses: Not hard at all  Food Insecurity: No Food Insecurity  (01/24/2024)   Hunger Vital Sign    Worried About Running Out of Food in the Last Year: Never true    Ran Out of Food in the Last Year: Never true  Transportation Needs: No Transportation Needs (01/24/2024)   PRAPARE - Administrator, Civil Service (Medical): No    Lack of Transportation (Non-Medical): No  Physical Activity: Inactive (01/24/2024)   Exercise Vital Sign    Days of Exercise per Week: 0 days    Minutes of Exercise per Session: Not on file  Stress: No Stress Concern Present (11/19/2023)   [employer] of Occupational Health - Occupational Stress Questionnaire    Feeling of Stress : Only a little  Social Connections: Socially Isolated (01/24/2024)   Social Connection and Isolation Panel    Frequency of Communication with Friends and Family: Not on file    Frequency of Social Gatherings with Friends and Family: More than three times a week    Attends Religious Services: Never    Database administrator or Organizations: No    Attends Engineer, structural: Not on file    Marital Status: Divorced  Intimate Partner Violence: Not At Risk (11/19/2023)   Humiliation, Afraid, Rape, and Kick questionnaire    Fear of Current or Ex-Partner: No    Emotionally Abused: No    Physically Abused: No    Sexually Abused: No   Family Status  Relation Name Status   Mother Landon Lunger Deceased   Father Rex Lunger Deceased   Son Ron (Not Specified)   Daughter Mliss (Not Specified)  No partnership data on file   Family History  Problem Relation Age of Onset   Heart disease Mother    Heart disease Father    Hypertension Son    Hypertension Daughter    Allergies  Allergen Reactions   Codeine Nausea And Vomiting   Lidocaine Swelling and Anaphylaxis      Review of Systems  All other systems reviewed and are negative.     Objective:     There were no vitals taken for this visit. BP Readings from Last 3 Encounters:  01/18/24 138/68  11/19/23 (!) 100/58   11/03/23 97/62   Wt Readings from Last 3 Encounters:  01/18/24 150 lb (68 kg)  11/19/23 148 lb 6.4 oz (67.3 kg)  11/03/23 146 lb 12.8 oz (66.6 kg)      Physical Exam Constitutional:      Appearance: Normal appearance.  HENT:  Head: Normocephalic and atraumatic.  Eyes:     Conjunctiva/sclera: Conjunctivae normal.  Cardiovascular:     Rate and Rhythm: Normal rate and regular rhythm.  Pulmonary:     Effort: Pulmonary effort is normal.     Breath sounds: Normal breath sounds.  Skin:    General: Skin is warm and dry.     Comments: Extensive hematomas on arms and legs with covered skin tears on right forearm and right knee and shin. Skin tear on upper lip healing well.   Neurological:     General: No focal deficit present.     Mental Status: She is alert. Mental status is at baseline.  Psychiatric:        Mood and Affect: Mood normal.        Behavior: Behavior normal.      No results found for any visits on 01/28/24.  Last CBC Lab Results  Component Value Date   WBC 7.0 04/28/2023   HGB 12.9 04/28/2023   HCT 39.8 04/28/2023   MCV 98.0 04/28/2023   MCH 31.8 04/28/2023   RDW 13.2 04/28/2023   PLT 280 04/28/2023   Last metabolic panel Lab Results  Component Value Date   GLUCOSE 91 04/28/2023   NA 142 04/28/2023   K 4.5 04/28/2023   CL 103 04/28/2023   CO2 32 04/28/2023   BUN 15 04/28/2023   CREATININE 0.63 04/28/2023   EGFR 87 04/28/2023   CALCIUM  9.3 04/28/2023   PROT 6.1 04/28/2023   ALBUMIN 4.0 03/29/2019   BILITOT 0.4 04/28/2023   ALKPHOS 48 03/29/2019   AST 13 04/28/2023   ALT 12 04/28/2023   ANIONGAP 9 03/29/2019   Last lipids Lab Results  Component Value Date   CHOL 196 06/04/2021   HDL 70 06/04/2021   LDLCALC 107 (H) 06/04/2021   TRIG 97 06/04/2021   CHOLHDL 2.8 06/04/2021   Last hemoglobin A1c No results found for: HGBA1C Last thyroid  functions Lab Results  Component Value Date   TSH 1.86 04/28/2023   Last vitamin D  Lab  Results  Component Value Date   VD25OH 56.8 06/27/2016   Last vitamin B12 and Folate Lab Results  Component Value Date   VITAMINB12 657 04/28/2023   FOLATE 13.3 04/28/2023      The ASCVD Risk score (Arnett DK, et al., 2019) failed to calculate for the following reasons:   The 2019 ASCVD risk score is only valid for ages 56 to 32    Assessment & Plan:   Assessment & Plan Multiple contusions and abrasions Contusions and abrasions from a fall with extensive bruising and swelling, particularly in arms and knee. No severe musculoskeletal injury indicated by good range of motion. Recovery expected in six weeks. - Continue alternating warm and cold compresses for pain and swelling management. - Ensure physical therapy follows up for strengthening exercises. - Order a commode riser through insurance to assist with mobility. - Working with home wound care.   Chronic neuropathic pain Chronic neuropathic pain in back and feet. Duloxetine  60 mg daily may provide relief. Gabapentin ineffective. Lyrica  not prescribed due to dizziness risk, especially given fall history. - Continue duloxetine  60 mg once daily. - Consider taking duloxetine  at night if it aids sleep.  COPD Using Wixela but prefers Breo for dosing convenience. Insurance issues previously affected coverage. Ranell will be attempted again as cost is less of a concern. - Prescribe Breo Ellipta  250 mcg. - Monitor insurance response for coverage. - Ensure she has adequate supply  of Wixela until Breo is approved.  Gastroesophageal reflux disease (GERD) On pantoprazole  for GERD. - Continue pantoprazole  as prescribed.   - For home use only DME Other see comment - fluticasone  furoate-vilanterol (BREO ELLIPTA ) 200-25 MCG/ACT AEPB; Inhale 1 puff into the lungs daily.  Dispense: 60 each; Refill: 5 - DULoxetine  (CYMBALTA ) 60 MG capsule; Take 1 capsule (60 mg total) by mouth daily.  Dispense: 90 capsule; Refill: 1    Return in about 6  months (around 07/30/2024).    Sharyle Fischer, DO

## 2024-01-28 NOTE — Telephone Encounter (Signed)
 Requested by interface surescripts. Future visit today .  Requested Prescriptions  Pending Prescriptions Disp Refills   pantoprazole  (PROTONIX ) 40 MG tablet [Pharmacy Med Name: PANTOPRAZOLE  SOD DR 40 MG TAB] 90 tablet 1    Sig: TAKE 1 TABLET BY MOUTH EVERY DAY     Gastroenterology: Proton Pump Inhibitors Passed - 01/28/2024 10:54 AM      Passed - Valid encounter within last 12 months    Recent Outpatient Visits           1 week ago Multiple skin tears   [facility] Bernardo Fend, [place]       Future Appointments             Today Bernardo Fend, DO [facility]   In 9 months Hester Alm BROCKS, MD [facility]

## 2024-02-03 ENCOUNTER — Ambulatory Visit: Payer: Self-pay

## 2024-02-03 ENCOUNTER — Ambulatory Visit: Admitting: Family Medicine

## 2024-02-03 ENCOUNTER — Encounter: Payer: Self-pay | Admitting: Family Medicine

## 2024-02-03 VITALS — BP 118/66 | HR 100 | Resp 18 | Ht 66.0 in | Wt 147.0 lb

## 2024-02-03 DIAGNOSIS — S81001A Unspecified open wound, right knee, initial encounter: Secondary | ICD-10-CM

## 2024-02-03 DIAGNOSIS — S51801A Unspecified open wound of right forearm, initial encounter: Secondary | ICD-10-CM | POA: Diagnosis not present

## 2024-02-03 DIAGNOSIS — R000 Tachycardia, unspecified: Secondary | ICD-10-CM

## 2024-02-03 DIAGNOSIS — S8001XA Contusion of right knee, initial encounter: Secondary | ICD-10-CM

## 2024-02-03 MED ORDER — CEPHALEXIN 500 MG PO CAPS: 500.0000 mg | ORAL_CAPSULE | Freq: Two times a day (BID) | ORAL | 0 refills | Status: AC

## 2024-02-03 NOTE — Telephone Encounter (Signed)
 Copied from CRM [id number]. Topic: Clinical - Order For Equipment >> Feb 03, 2024 11:35 AM Antwanette L wrote: Reason for CRM: Shantae from [employer] is calling to let Dr. Bernardo know that they couldn't fulfil the order for a riser for commode due to insurance purposes and to send the order to [facility] [employer]. >> Feb 03, 2024 11:40 AM Antwanette L wrote: Griselda can be contacted at [phone]

## 2024-02-03 NOTE — Progress Notes (Signed)
 Patient ID: Teresa Chambers, female    DOB: Dec 29, 1935, 88 y.o.   MRN: [id number]  PCP: Bernardo Fend, DO  Chief Complaint  Patient presents with   Edema    R knee, recent fall. Seen 01/28/24 for the fall, bruising, and swelling. Home health nurse said it looks infected and needs antibiotics    Subjective:   Teresa Chambers is a 88 y.o. female, presents to clinic with CC of the following:  HPI  Discussed the use of AI scribe software for clinical note transcription with the patient, who gave verbal consent to proceed.  History of Present Illness Teresa Chambers is an 88 year old female with osteoarthritis who presents with right knee pain following a recent fall.  Right knee pain and swelling - Acute right knee pain and swelling following a fall on June 30th, during which she landed on both knees - Ongoing tenderness, swelling, and discoloration of the right knee - Bruising extends around the knee and to the back of the thigh - Limited range of motion and pain with knee flexion, making it difficult to get into a car - No pain in the back of the knee despite bruising - Swelling was previously larger and is now decreasing - Uses heating pad and occasional ice for pain relief - Keeps legs propped up due to swelling - Receiving home wound care for a skin tear on the knee  Cutaneous injuries and wound healing - Sustained a skin tear on the right knee and a right forearm skin tear during the fall - Right forearm skin tear is still bleeding - Received stitches for lip and arm injuries - Completed a course of prednisone , which may have helped reduce swelling  Chronic osteoarthritis and pain management - History of osteoarthritis with 'bone on bone' knee pain - Receives intra-articular injections every three months for knee pain - Taking pain medication since hospital discharge, initially started for back pain - Allergic to lidocaine and codeine, but not hydrocodone   Vascular  insufficiency and peripheral cyanosis - History of vascular insufficiency causing feet to turn blue - Feet have remained blue, possibly related to prolonged periods of lying down with feet propped up - On Eliquis , not taking aspirin or Plavix   Constitutional symptoms and vital signs - Normal temperature is 96.2F, but recently recorded at 54F - Experiencing fatigue and increased somnolence - Heart rate has been elevated, not dropping below 119 bpm at home    Patient Active Problem List   Diagnosis Date Noted   Atrial flutter, unspecified type (HCC) 07/30/2023   Palpitation 05/02/2022   Cellulitis and abscess of lower extremity 02/20/2021   Raynaud's disease without gangrene 12/31/2020   Hypothyroidism due to Hashimoto's thyroiditis 06/11/2020   Callus of foot 05/28/2020   Essential hypertension 05/28/2020   Annual physical exam 05/28/2020   Major depression, recurrent, chronic (HCC) 04/09/2020   Pneumonia 11/10/2019   Sepsis without acute organ dysfunction (HCC) 11/10/2019   Leukocytosis 10/23/2019   Chronic systolic (congestive) heart failure (HCC) 10/22/2019   Closed fracture of multiple pubic rami, left, initial encounter (HCC) 10/22/2019   Closed fracture of proximal end of right humerus 01/10/2019   Chronic venous insufficiency 09/06/2018   Lymphedema 09/06/2018   GERD without esophagitis 03/10/2018   Bilateral dry eyes 03/10/2018   Chronic obstructive pulmonary disease (HCC) 03/04/2018   Acute exacerbation of COPD with asthma (HCC) 02/22/2018   Chronic anemia 02/22/2018   Chronic constipation 02/22/2018   Chronic bilateral  thoracic back pain 02/22/2018   Closed displaced fracture of right femoral neck (HCC) 02/22/2018   CHF (congestive heart failure) (HCC)    Status post hip hemiarthroplasty 02/12/2018   Hip fracture (HCC) 02/11/2018   Asthma 04/30/2017   Chronic reflux esophagitis 04/30/2017   Dyslipidemia 04/30/2017   Osteoporosis 04/30/2017   Cardiac murmur,  previously undiagnosed 04/30/2017   Diverticulosis of small intestine without hemorrhage 04/30/2017   Dyspnea on exertion 04/30/2017   Hx of adenomatous colonic polyps 04/30/2017   Hx of cardiomegaly 04/30/2017   Lumbar arthropathy 04/30/2017   Nasopharyngitis 04/30/2017   Nonspecific chest pain 04/30/2017   Osteoarthropathy 04/30/2017   Leg swelling 04/30/2017   Sinusitis, acute 04/30/2017   History of bone density study 06/28/2004      Current Outpatient Medications:    acetaminophen  (TYLENOL  8 HOUR ARTHRITIS PAIN) 650 MG CR tablet, , Disp: , Rfl:    albuterol  (VENTOLIN  HFA) 108 (90 Base) MCG/ACT inhaler, TAKE 2 PUFFS BY MOUTH EVERY 6 HOURS AS NEEDED FOR WHEEZE OR SHORTNESS OF BREATH, Disp: 54 each, Rfl: 1   Ascorbic Acid (VITAMIN C WITH ROSE HIPS) 1000 MG tablet, Take 1,000 mg by mouth daily., Disp: , Rfl:    cholecalciferol (VITAMIN D3) 25 MCG (1000 UNIT) tablet, Take 1,000 Units by mouth daily., Disp: , Rfl:    DULoxetine  (CYMBALTA ) 60 MG capsule, Take 1 capsule (60 mg total) by mouth daily., Disp: 90 capsule, Rfl: 1   ELIQUIS  5 MG TABS tablet, TAKE 1 TABLET BY MOUTH TWICE A DAY, Disp: 180 tablet, Rfl: 3   ezetimibe  (ZETIA ) 10 MG tablet, TAKE 1 TABLET BY MOUTH EVERY DAY, Disp: 90 tablet, Rfl: 1   ferrous sulfate 325 (65 FE) MG tablet, Take 325 mg by mouth daily with breakfast., Disp: , Rfl:    fluticasone  (FLONASE ) 50 MCG/ACT nasal spray, SHAKE LIQUID AND USE 2 SPRAYS IN EACH NOSTRIL DAILY, Disp: 48 g, Rfl: 6   fluticasone  furoate-vilanterol (BREO ELLIPTA ) 200-25 MCG/ACT AEPB, Inhale 1 puff into the lungs daily., Disp: 60 each, Rfl: 5   HYDROcodone -acetaminophen  (NORCO/VICODIN) 5-325 MG tablet, Take 1 tablet by mouth every 4 (four) hours as needed., Disp: , Rfl:    levothyroxine  (SYNTHROID ) 50 MCG tablet, TAKE 1 TABLET BY MOUTH EVERY DAY, Disp: 90 tablet, Rfl: 3   metoprolol  succinate (TOPROL -XL) 25 MG 24 hr tablet, Take 1 tablet (25 mg total) by mouth daily., Disp: 90 tablet, Rfl:  3   pantoprazole  (PROTONIX ) 40 MG tablet, TAKE 1 TABLET BY MOUTH EVERY DAY, Disp: 90 tablet, Rfl: 1   sacubitril -valsartan  (ENTRESTO ) 24-26 MG, Take 1 tablet by mouth 2 (two) times daily., Disp: 180 tablet, Rfl: 3   traMADol  (ULTRAM ) 50 MG tablet, , Disp: , Rfl:    Allergies  Allergen Reactions   Codeine Nausea And Vomiting   Lidocaine Swelling and Anaphylaxis     Social History   Tobacco Use   Smoking status: Never   Smokeless tobacco: Never  Vaping Use   Vaping status: Never Used  Substance Use Topics   Alcohol use: Not Currently   Drug use: Never      Chart Review Today: I personally reviewed active problem list, medication list, allergies, family history, social history, health maintenance, notes from last encounter, lab results, imaging with the patient/caregiver today.   Review of Systems  Constitutional:  Positive for fatigue and fever.  HENT: Negative.    Eyes: Negative.   Respiratory: Negative.    Cardiovascular: Negative.   Gastrointestinal: Negative.  Endocrine: Negative.   Genitourinary: Negative.   Musculoskeletal: Negative.   Skin: Negative.   Allergic/Immunologic: Negative.   Neurological: Negative.   Hematological: Negative.   Psychiatric/Behavioral: Negative.    All other systems reviewed and are negative.      Objective:   Vitals:   02/03/24 1318  BP: 118/66  Pulse: (!) 119  Resp: 18  SpO2: 96%  Weight: 147 lb (66.7 kg)  Height: 5' 6 (1.676 m)    Body mass index is 23.73 kg/m.  Physical Exam Vitals and nursing note reviewed.  Constitutional:      General: She is not in acute distress.    Appearance: Normal appearance. She is well-developed. She is not ill-appearing, toxic-appearing or diaphoretic.  HENT:     Head: Normocephalic and atraumatic.     Right Ear: External ear normal.     Left Ear: External ear normal.     Nose: Nose normal.  Eyes:     General: No scleral icterus.       Right eye: No discharge.        Left eye:  No discharge.     Conjunctiva/sclera: Conjunctivae normal.  Neck:     Trachea: No tracheal deviation.  Cardiovascular:     Rate and Rhythm: Tachycardia present.  Pulmonary:     Effort: Pulmonary effort is normal. No respiratory distress.     Breath sounds: No stridor.  Skin:    General: Skin is warm.     Findings: Bruising present. No rash.     Comments: Bandage to right forearm Bruising to left knee Right knee swelling bruising with tenderness and fluctuance over patella, diffuse bruising to half way up right thigh, over knee, behind knee and halfway down right leg Bilateral feet with rubor Decreased right knee ROM - limited flexion, normal extension Antalgic gait walking with cane  Neurological:     Mental Status: She is alert.     Motor: No abnormal muscle tone.     Coordination: Coordination normal.     Gait: Gait normal.  Psychiatric:        Mood and Affect: Mood normal.        Behavior: Behavior normal.                   Assessment & Plan:   Fall with multiple injuries onset 6/30, seen by PCP on 7/3, here for f/up due to recommendation of HH wound care RN who was concerned for cellulitis/infection and thought pt needed abx Pt tachycardic, feeling tired and has low grade fever  The swelling/bruising on right knee has slowly improved but this was the area of concern due to [facility] care The small skin tear does not appear infected, still open, covered with bandage and xeroform Suspect over patella possibly hematoma or some fluid collection - may need ortho/specialist to eval/aspirate? Evacuate? But pt states it is getting smaller and better Will start abx to cover for possible skin infection and do close f/up in our office in 2 days Urgent referral/coordination with pts current ortho who has been managing her knees    Assessment and Plan Assessment & Plan Right knee pain with possible hematoma or infection Teresa Chambers presents with right knee pain following a  recent fall, resulting in a skin tear and significant bruising. There is swelling and tenderness over the kneecap, with a possible fluid collection that could be a hematoma or infection. The swelling is decreasing, but there is still significant discomfort and limited  range of motion. There is a concern for infection, given her low-grade fever and elevated heart rate, although there is no obvious cellulitis. The possibility of a hematoma or fluid collection that may need aspiration or drainage is considered. She is on Eliquis , which may contribute to prolonged bleeding and bruising. The risks of aspiration or drainage include potential infection and further complications, but these procedures may be necessary if the fluid does not resolve on its own. - Refer to orthopedic specialist for evaluation of possible hematoma or infection - Consider ultrasound to assess fluid collection - Prescribe antibiotics for possible cellulitis or skin infection - Recheck heart rate before discharge - Schedule follow-up appointment for re-evaluation on Friday  Osteoarthritis of the knees Teresa Chambers has osteoarthritis in the knees, described as 'bone on bone,' and receives injections every three months for management. The recent fall and subsequent injury may exacerbate her osteoarthritis symptoms.  Vascular insufficiency Teresa Chambers has vascular insufficiency, contributing to prolonged bruising and discoloration in the lower extremities. Her feet have been turning blue, consistent with her known vascular issues. This condition may delay healing and reabsorption of bruising.  Goals of Care Teresa Chambers expressed a desire for prednisone  for swelling, citing her brother's use of it. The risks of long-term steroid use, including immune suppression and side effects, were explained, and it was advised against for her condition. She mentioned that at her age, the difference in risks does not matter to her, indicating a preference for symptom relief  over potential long-term side effects.  Follow-up Teresa Chambers needs follow-up care to monitor the knee injury and ensure proper healing. Coordination with orthopedic specialists is necessary for further management. - Send message to orthopedic specialist Lance for evaluation - Ensure follow-up appointment on Friday for re-evaluation  Recording duration: 16 minutes    ICD-10-CM   1. Contusion of right knee, initial encounter  S80.01XA Ambulatory referral to Orthopedic Surgery   pain constant since injury, no worsening or ROM, some swelling and bruising improving - f/up ortho    2. Traumatic hematoma of right knee, initial encounter  S80.01XA Ambulatory referral to Orthopedic Surgery   f/up ortho    3. Wound, open, arm, forearm, right, initial encounter  S51.801A Ambulatory referral to Orthopedic Surgery    cephALEXin  (KEFLEX ) 500 MG capsule   continue wound care with HH    4. Open wound of right knee, initial encounter  S81.001A cephALEXin  (KEFLEX ) 500 MG capsule   cover with abx    5. Tachycardia  R00.0    pt feels like she has fever, fatigue and tachycardia improved some prior to her leaving clinic HR 100, close recheck on pt in clinic in 2 d       Return for recheck fever, swelling, pain, wound recheck 2 d friday .      Michelene Cower, PA-C 02/03/24 1:41 PM

## 2024-02-03 NOTE — Telephone Encounter (Signed)
  FYI Only or Action Required?: FYI only for provider.  Patient was last seen in primary care on 01/28/2024 by Bernardo Fend, DO.  Called Nurse Triage reporting Joint Swelling.  Symptoms began a week ago.  Interventions attempted: Nothing.  Symptoms are: gradually worsening.  Triage Disposition: No disposition on file.  Patient/caregiver understands and will follow disposition?:     Copied from CRM [id number]. Topic: Clinical - Red Word Triage >> Feb 03, 2024 11:55 AM Antwanette L wrote: Red Word that prompted transfer to Nurse Triage: Patient had fell and saw Dr. Bernardo on 7/10. Patient right knee is swollen  and filled with fluid Reason for Disposition  SEVERE swelling (e.g., can't move swollen knee at all)  Answer Assessment - Initial Assessment Questions 1. LOCATION: Where is the swelling located?  (e.g., left, right, both knees)     Call from Home health nurse, right knee swollen and filled with fluid 2. ONSET: When did the swelling start? Does it come and go, or is it there all the time?     Couple of weeks ago had a fall and was seen on 01/28/24 by Dr. Bernardo 3. SWELLING: How bad is the swelling? Or, How large is it? (e.g., mild, moderate, severe; size of localized swelling)      Red, dark purple and fluid filled with warmth and warm to touch 4. PAIN: Is there any pain? If Yes, ask: How bad is it? (Scale 0-10; or none, mild, moderate, severe)     moderate 5. SETTING: Has there been any recent work, exercise or other activity that involved that part of the body?      Couple weeks ago had a fall 6. AGGRAVATING FACTORS: What makes the knee swelling worse? (e.g., walking, climbing stairs, running)     Walking  7. ASSOCIATED SYMPTOMS: Is there any pain or redness?     Yes, see above 8. OTHER SYMPTOMS: Do you have any other symptoms? (e.g., calf pain, chest pain, difficulty breathing, fever)     Fever 99 9. PREGNANCY: Is there any chance you are  pregnant? When was your last menstrual period?     na  Protocols used: Knee Swelling-A-AH

## 2024-02-03 NOTE — Telephone Encounter (Signed)
 Spoke to medical supply company Medicare do not cover the commode riser and it is $80 at the store.

## 2024-02-03 NOTE — Telephone Encounter (Signed)
 Order also sent through [facility] and know company covers this supply

## 2024-02-03 NOTE — Telephone Encounter (Signed)
 Attempted to move appt to later today, no available appts with any providers today. Pt will keep her 1320 appt this afternoon.

## 2024-02-05 ENCOUNTER — Encounter: Payer: Self-pay | Admitting: Family Medicine

## 2024-02-05 ENCOUNTER — Ambulatory Visit (INDEPENDENT_AMBULATORY_CARE_PROVIDER_SITE_OTHER): Admitting: Family Medicine

## 2024-02-05 VITALS — BP 116/66 | HR 88 | Temp 97.8°F | Resp 16 | Ht 66.0 in | Wt 147.0 lb

## 2024-02-05 DIAGNOSIS — M25511 Pain in right shoulder: Secondary | ICD-10-CM

## 2024-02-05 DIAGNOSIS — L03115 Cellulitis of right lower limb: Secondary | ICD-10-CM | POA: Diagnosis not present

## 2024-02-05 DIAGNOSIS — S51801D Unspecified open wound of right forearm, subsequent encounter: Secondary | ICD-10-CM

## 2024-02-05 DIAGNOSIS — S51801A Unspecified open wound of right forearm, initial encounter: Secondary | ICD-10-CM

## 2024-02-05 DIAGNOSIS — S8001XD Contusion of right knee, subsequent encounter: Secondary | ICD-10-CM

## 2024-02-05 DIAGNOSIS — S81001A Unspecified open wound, right knee, initial encounter: Secondary | ICD-10-CM

## 2024-02-05 DIAGNOSIS — R000 Tachycardia, unspecified: Secondary | ICD-10-CM | POA: Diagnosis not present

## 2024-02-05 DIAGNOSIS — S81001D Unspecified open wound, right knee, subsequent encounter: Secondary | ICD-10-CM

## 2024-02-05 DIAGNOSIS — S8001XA Contusion of right knee, initial encounter: Secondary | ICD-10-CM

## 2024-02-05 NOTE — Progress Notes (Signed)
 Patient ID: Teresa Chambers, female    DOB: 1936/03/21, 88 y.o.   MRN: [id number]  PCP: Bernardo Fend, DO  Chief Complaint  Patient presents with   Follow-up    Knee contusion, swelling, pain    Subjective:   Teresa Chambers is a 88 y.o. female, presents to clinic with CC of the following:  HPI  Pt having some less bruising swelling redness and improved HR with antibiotics She has not gotten into ortho yet - but she got a msg that they are working on her appt 97.8 - temp and HR normalized, she is still tired and right knee pain is getting better right shoulder is still hurting with some limited ROM   Patient Active Problem List   Diagnosis Date Noted   Atrial flutter, unspecified type (HCC) 07/30/2023   Palpitation 05/02/2022   Cellulitis and abscess of lower extremity 02/20/2021   Raynaud's disease without gangrene 12/31/2020   Hypothyroidism due to Hashimoto's thyroiditis 06/11/2020   Callus of foot 05/28/2020   Essential hypertension 05/28/2020   Annual physical exam 05/28/2020   Major depression, recurrent, chronic (HCC) 04/09/2020   Pneumonia 11/10/2019   Sepsis without acute organ dysfunction (HCC) 11/10/2019   Leukocytosis 10/23/2019   Chronic systolic (congestive) heart failure (HCC) 10/22/2019   Closed fracture of multiple pubic rami, left, initial encounter (HCC) 10/22/2019   Closed fracture of proximal end of right humerus 01/10/2019   Chronic venous insufficiency 09/06/2018   Lymphedema 09/06/2018   GERD without esophagitis 03/10/2018   Bilateral dry eyes 03/10/2018   Chronic obstructive pulmonary disease (HCC) 03/04/2018   Acute exacerbation of COPD with asthma (HCC) 02/22/2018   Chronic anemia 02/22/2018   Chronic constipation 02/22/2018   Chronic bilateral thoracic back pain 02/22/2018   Closed displaced fracture of right femoral neck (HCC) 02/22/2018   CHF (congestive heart failure) (HCC)    Status post hip hemiarthroplasty 02/12/2018   Hip  fracture (HCC) 02/11/2018   Asthma 04/30/2017   Chronic reflux esophagitis 04/30/2017   Dyslipidemia 04/30/2017   Osteoporosis 04/30/2017   Cardiac murmur, previously undiagnosed 04/30/2017   Diverticulosis of small intestine without hemorrhage 04/30/2017   Dyspnea on exertion 04/30/2017   Hx of adenomatous colonic polyps 04/30/2017   Hx of cardiomegaly 04/30/2017   Lumbar arthropathy 04/30/2017   Nasopharyngitis 04/30/2017   Nonspecific chest pain 04/30/2017   Osteoarthropathy 04/30/2017   Leg swelling 04/30/2017   Sinusitis, acute 04/30/2017   History of bone density study 06/28/2004      Current Outpatient Medications:    acetaminophen  (TYLENOL  8 HOUR ARTHRITIS PAIN) 650 MG CR tablet, , Disp: , Rfl:    albuterol  (VENTOLIN  HFA) 108 (90 Base) MCG/ACT inhaler, TAKE 2 PUFFS BY MOUTH EVERY 6 HOURS AS NEEDED FOR WHEEZE OR SHORTNESS OF BREATH, Disp: 54 each, Rfl: 1   Ascorbic Acid (VITAMIN C WITH ROSE HIPS) 1000 MG tablet, Take 1,000 mg by mouth daily., Disp: , Rfl:    cephALEXin  (KEFLEX ) 500 MG capsule, Take 1 capsule (500 mg total) by mouth 2 (two) times daily for 7 days., Disp: 14 capsule, Rfl: 0   cholecalciferol (VITAMIN D3) 25 MCG (1000 UNIT) tablet, Take 1,000 Units by mouth daily., Disp: , Rfl:    DULoxetine  (CYMBALTA ) 60 MG capsule, Take 1 capsule (60 mg total) by mouth daily., Disp: 90 capsule, Rfl: 1   ELIQUIS  5 MG TABS tablet, TAKE 1 TABLET BY MOUTH TWICE A DAY, Disp: 180 tablet, Rfl: 3   ezetimibe  (ZETIA ) 10  MG tablet, TAKE 1 TABLET BY MOUTH EVERY DAY, Disp: 90 tablet, Rfl: 1   ferrous sulfate 325 (65 FE) MG tablet, Take 325 mg by mouth daily with breakfast., Disp: , Rfl:    fluticasone  (FLONASE ) 50 MCG/ACT nasal spray, SHAKE LIQUID AND USE 2 SPRAYS IN EACH NOSTRIL DAILY, Disp: 48 g, Rfl: 6   fluticasone  furoate-vilanterol (BREO ELLIPTA ) 200-25 MCG/ACT AEPB, Inhale 1 puff into the lungs daily., Disp: 60 each, Rfl: 5   HYDROcodone -acetaminophen  (NORCO/VICODIN) 5-325 MG  tablet, Take 1 tablet by mouth every 4 (four) hours as needed., Disp: , Rfl:    levothyroxine  (SYNTHROID ) 50 MCG tablet, TAKE 1 TABLET BY MOUTH EVERY DAY, Disp: 90 tablet, Rfl: 3   metoprolol  succinate (TOPROL -XL) 25 MG 24 hr tablet, Take 1 tablet (25 mg total) by mouth daily., Disp: 90 tablet, Rfl: 3   pantoprazole  (PROTONIX ) 40 MG tablet, TAKE 1 TABLET BY MOUTH EVERY DAY, Disp: 90 tablet, Rfl: 1   sacubitril -valsartan  (ENTRESTO ) 24-26 MG, Take 1 tablet by mouth 2 (two) times daily., Disp: 180 tablet, Rfl: 3   traMADol  (ULTRAM ) 50 MG tablet, , Disp: , Rfl:    Allergies  Allergen Reactions   Codeine Nausea And Vomiting   Lidocaine Swelling and Anaphylaxis     Social History   Tobacco Use   Smoking status: Never   Smokeless tobacco: Never  Vaping Use   Vaping status: Never Used  Substance Use Topics   Alcohol use: Not Currently   Drug use: Never      Chart Review Today: I personally reviewed active problem list, medication list, allergies, family history, social history, health maintenance, notes from last encounter, lab results, imaging with the patient/caregiver today.   Review of Systems  Constitutional: Negative.   HENT: Negative.    Eyes: Negative.   Respiratory: Negative.    Cardiovascular: Negative.   Gastrointestinal: Negative.   Endocrine: Negative.   Genitourinary: Negative.   Musculoskeletal: Negative.   Skin: Negative.   Allergic/Immunologic: Negative.   Neurological: Negative.   Hematological: Negative.   Psychiatric/Behavioral: Negative.    All other systems reviewed and are negative.      Objective:   Vitals:   02/05/24 1118  BP: 116/66  Pulse: 88  Resp: 16  SpO2: 98%  Weight: 147 lb (66.7 kg)  Height: 5' 6 (1.676 m)    Body mass index is 23.73 kg/m.  Physical Exam Vitals and nursing note reviewed.  Constitutional:      General: She is not in acute distress.    Appearance: Normal appearance. She is well-developed. She is not  ill-appearing, toxic-appearing or diaphoretic.  HENT:     Head: Normocephalic and atraumatic.     Right Ear: External ear normal.     Left Ear: External ear normal.     Nose: Nose normal.  Eyes:     General: No scleral icterus.       Right eye: No discharge.        Left eye: No discharge.     Conjunctiva/sclera: Conjunctivae normal.  Neck:     Trachea: No tracheal deviation.  Cardiovascular:     Rate and Rhythm: Normal rate. Rhythm irregular.     Heart sounds: Normal heart sounds.  Pulmonary:     Effort: Pulmonary effort is normal. No respiratory distress.     Breath sounds: No stridor. No wheezing, rhonchi or rales.  Skin:    General: Skin is warm.     Findings: Bruising present.  Comments: See photos Continued contusion and hematoma over right patella with some improving surrounding swelling, bruising and less ttp than last OV 2 d ago  Neurological:     Mental Status: She is alert.     Motor: No abnormal muscle tone.     Coordination: Coordination normal.     Gait: Gait abnormal (antalgic walking with cane).  Psychiatric:        Mood and Affect: Mood normal.        Behavior: Behavior normal.              Assessment & Plan:     ICD-10-CM   1. Contusion of right knee, initial encounter  S80.01XA    some improvement, f/up ortho    2. Traumatic hematoma of right knee, initial encounter  S80.01XA    continued hematoma with improving surrounding swelling bruising and improving tenderness    3. Wound, open, arm, forearm, right, initial encounter  S51.801A    being managed by [facility] wound care, continue    4. Open wound of right knee, initial encounter  S81.001A    small skin tear bandage and surrounding area improving from 2 d ago, continue with [facility] wound care    5. Tachycardia  R00.0    resolved    6. Cellulitis of right lower extremity  L03.115    improving tenderness and swelling, resolved tachycardia and low grade fever, finish abx, f/up next week with ortho,  if no appt f/up with PCP if any worsening    7. Acute pain of right shoulder  M25.511    limited ROM, f/up ortho        Michelene Cower, PA-C 02/05/24 11:42 AM

## 2024-02-05 NOTE — Patient Instructions (Signed)
 Finish your antibiotics   For your right shoulder and right knee call [facility] and get a follow up with them as soon as you can. We did put in a referral but I would reach out to them if you haven't gotten an appointment yet.

## 2024-02-08 ENCOUNTER — Other Ambulatory Visit: Payer: Self-pay | Admitting: Internal Medicine

## 2024-02-08 DIAGNOSIS — J309 Allergic rhinitis, unspecified: Secondary | ICD-10-CM

## 2024-02-10 ENCOUNTER — Other Ambulatory Visit: Payer: Self-pay | Admitting: Internal Medicine

## 2024-02-10 DIAGNOSIS — J309 Allergic rhinitis, unspecified: Secondary | ICD-10-CM

## 2024-02-10 NOTE — Telephone Encounter (Signed)
 Requested Prescriptions  Pending Prescriptions Disp Refills   fluticasone  (FLONASE ) 50 MCG/ACT nasal spray [Pharmacy Med Name: FLUTICASONE  PROP 50 MCG SPRAY] 48 mL 2    Sig: SHAKE LIQUID AND USE 2 SPRAYS IN EACH NOSTRIL DAILY     Ear, Nose, and Throat: Nasal Preparations - Corticosteroids Passed - 02/10/2024  1:32 PM      Passed - Valid encounter within last 12 months    Recent Outpatient Visits           5 days ago Contusion of right knee, initial encounter   [facility] Leavy Mole, PA-C   1 week ago Contusion of right knee, initial encounter   [facility] Leavy Mole, PA-C   1 week ago [facility] Bernardo Fend, DO   2 weeks ago Multiple skin tears   [facility] Bernardo Fend, DO       Future Appointments             In 5 months Bernardo Fend, DO [facility]   In 9 months Hester Alm BROCKS, MD [facility]

## 2024-02-10 NOTE — Telephone Encounter (Unsigned)
 Copied from CRM [id number]. Topic: Clinical - Medication Refill >> Feb 10, 2024 10:49 AM Donee H wrote: Medication: fluticasone  (FLONASE ) 50 MCG/ACT nasal spray  Has the patient contacted their pharmacy? Yes, pharmacy advised patient to reach out to provider  This is the patient's preferred pharmacy:  [id number] [place], [place] - [address] [place] [address] Phone: [phone] Fax: [phone]  Is this the correct pharmacy for this prescription? Yes  Has the prescription been filled recently? No  Is the patient out of the medication? No, but only have a little left maybe enough to last 2 days  Has the patient been seen for an appointment in the last year OR does the patient have an upcoming appointment? Yes  Can we respond through MyChart? Yes  Agent: Please be advised that Rx refills may take up to 3 business days. We ask that you follow-up with your pharmacy.

## 2024-02-11 NOTE — Telephone Encounter (Signed)
 Already filled

## 2024-02-12 DIAGNOSIS — M75101 Unspecified rotator cuff tear or rupture of right shoulder, not specified as traumatic: Secondary | ICD-10-CM | POA: Diagnosis not present

## 2024-02-12 DIAGNOSIS — M12811 Other specific arthropathies, not elsewhere classified, right shoulder: Secondary | ICD-10-CM | POA: Diagnosis not present

## 2024-02-12 DIAGNOSIS — M7041 Prepatellar bursitis, right knee: Secondary | ICD-10-CM | POA: Diagnosis not present

## 2024-02-12 DIAGNOSIS — M19011 Primary osteoarthritis, right shoulder: Secondary | ICD-10-CM | POA: Diagnosis not present

## 2024-02-12 DIAGNOSIS — S8001XA Contusion of right knee, initial encounter: Secondary | ICD-10-CM | POA: Diagnosis not present

## 2024-02-12 DIAGNOSIS — W010XXD Fall on same level from slipping, tripping and stumbling without subsequent striking against object, subsequent encounter: Secondary | ICD-10-CM | POA: Diagnosis not present

## 2024-02-16 ENCOUNTER — Telehealth: Payer: Self-pay

## 2024-02-16 NOTE — Telephone Encounter (Signed)
 Copied from CRM #[id number]. Topic: Clinical - Home Health Verbal Orders >> Feb 16, 2024 10:32 AM Montie POUR wrote: Caller/Agency: Mandy with [facility] Callback Number: [phone] - She has a secured voicemail Service Requested: Occupational Therapy and Physical Therapy Frequency: Evaluation to start service Any new concerns about the patient? No

## 2024-02-19 ENCOUNTER — Telehealth: Payer: Self-pay

## 2024-02-19 ENCOUNTER — Telehealth: Payer: Self-pay | Admitting: Internal Medicine

## 2024-02-19 NOTE — Telephone Encounter (Signed)
 Copied from CRM [id number]. Topic: Clinical - Home Health Verbal Orders >> Feb 19, 2024 10:24 AM Delon DASEN wrote: Caller/Agency: [place] with [facility] Callback Number: [phone] Service Requested: Physical Therapy Frequency: 1x week for 5 weeks Any new concerns about the patient? No

## 2024-02-19 NOTE — Telephone Encounter (Signed)
 [facility] notified

## 2024-02-19 NOTE — Telephone Encounter (Signed)
 Left detailed vm

## 2024-02-19 NOTE — Telephone Encounter (Signed)
 Copied from CRM [id number]. Topic: Referral - Question >> Feb 19, 2024 12:48 PM Jayma L wrote: Reason for CRM: home health called asking for a OT order to be placed for 1 week 1, and 2 week 3, callback number for home health is [phone].

## 2024-02-23 ENCOUNTER — Encounter: Payer: Self-pay | Admitting: Internal Medicine

## 2024-02-23 ENCOUNTER — Other Ambulatory Visit: Payer: Self-pay | Admitting: Internal Medicine

## 2024-02-24 ENCOUNTER — Other Ambulatory Visit: Payer: Self-pay | Admitting: Internal Medicine

## 2024-02-24 DIAGNOSIS — L03115 Cellulitis of right lower limb: Secondary | ICD-10-CM

## 2024-02-24 MED ORDER — DOXYCYCLINE HYCLATE 100 MG PO TABS: 100.0000 mg | ORAL_TABLET | Freq: Two times a day (BID) | ORAL | 0 refills | Status: AC

## 2024-03-08 DIAGNOSIS — M48062 Spinal stenosis, lumbar region with neurogenic claudication: Secondary | ICD-10-CM | POA: Diagnosis not present

## 2024-03-08 DIAGNOSIS — M5416 Radiculopathy, lumbar region: Secondary | ICD-10-CM | POA: Diagnosis not present

## 2024-03-23 DIAGNOSIS — M170 Bilateral primary osteoarthritis of knee: Secondary | ICD-10-CM | POA: Diagnosis not present

## 2024-03-23 DIAGNOSIS — G8929 Other chronic pain: Secondary | ICD-10-CM | POA: Diagnosis not present

## 2024-03-30 DIAGNOSIS — M48062 Spinal stenosis, lumbar region with neurogenic claudication: Secondary | ICD-10-CM | POA: Diagnosis not present

## 2024-03-30 DIAGNOSIS — M5416 Radiculopathy, lumbar region: Secondary | ICD-10-CM | POA: Diagnosis not present

## 2024-03-30 DIAGNOSIS — Z79899 Other long term (current) drug therapy: Secondary | ICD-10-CM | POA: Diagnosis not present

## 2024-04-05 ENCOUNTER — Encounter: Payer: Self-pay | Admitting: Internal Medicine

## 2024-05-05 ENCOUNTER — Other Ambulatory Visit: Payer: Self-pay | Admitting: Cardiovascular Disease

## 2024-05-05 ENCOUNTER — Other Ambulatory Visit: Payer: Self-pay | Admitting: Internal Medicine

## 2024-05-05 DIAGNOSIS — E785 Hyperlipidemia, unspecified: Secondary | ICD-10-CM

## 2024-05-06 NOTE — Telephone Encounter (Signed)
 Prescription refill request for Eliquis  received. Indication:palps Last office visit:12/24 Scr:0.63  10/24 Age: 88 Weight:66.7  kg  Prescription refilled

## 2024-05-09 NOTE — Telephone Encounter (Signed)
 Requested medications are due for refill today.  yes  Requested medications are on the active medications list.  yes  Last refill. 11/13/2023 #90 1 rf  Future visit scheduled.   yes  Notes to clinic.  Labs are expired.    Requested Prescriptions  Pending Prescriptions Disp Refills   ezetimibe  (ZETIA ) 10 MG tablet [Pharmacy Med Name: EZETIMIBE  10 MG TABLET] 90 tablet 1    Sig: TAKE 1 TABLET BY MOUTH EVERY DAY     Cardiovascular:  Antilipid - Sterol Transport Inhibitors Failed - 05/09/2024  7:48 AM      Failed - AST in normal range and within 360 days    AST  Date Value Ref Range Status  04/28/2023 13 10 - 35 U/L Final         Failed - ALT in normal range and within 360 days    ALT  Date Value Ref Range Status  04/28/2023 12 6 - 29 U/L Final         Failed - Lipid Panel in normal range within the last 12 months    Cholesterol  Date Value Ref Range Status  06/04/2021 196 <200 mg/dL Final   LDL Cholesterol (Calc)  Date Value Ref Range Status  06/04/2021 107 (H) mg/dL (calc) Final    Comment:    Reference range: <100 . Desirable range <100 mg/dL for primary prevention;   <70 mg/dL for patients with CHD or diabetic patients  with > or = 2 CHD risk factors. SABRA LDL-C is now calculated using the Martin-Hopkins  calculation, which is a validated novel method providing  better accuracy than the Friedewald equation in the  estimation of LDL-C.  Gladis APPLETHWAITE et al. SANDREA. 7986;689(80): 2061-2068  (http://education.[facility].com/faq/FAQ164)    HDL  Date Value Ref Range Status  06/04/2021 70 > OR = 50 mg/dL Final   Triglycerides  Date Value Ref Range Status  06/04/2021 97 <150 mg/dL Final         Passed - Patient is not pregnant      Passed - Valid encounter within last 12 months    Recent Outpatient Visits           3 months ago Contusion of right knee, initial encounter   [facility] Leavy Mole, PA-C   3 months ago Contusion of  right knee, initial encounter   [facility] Leavy Mole, PA-C   3 months ago [facility] Bernardo Fend, DO   3 months ago Multiple skin tears   [facility] Bernardo Fend, DO       Future Appointments             In 2 months Bernardo Fend, DO [facility] [place]   In 6 months Hester Alm BROCKS, MD [facility]

## 2024-05-28 ENCOUNTER — Other Ambulatory Visit: Payer: Self-pay | Admitting: Internal Medicine

## 2024-05-28 DIAGNOSIS — J449 Chronic obstructive pulmonary disease, unspecified: Secondary | ICD-10-CM

## 2024-05-30 NOTE — Telephone Encounter (Signed)
 Requested Prescriptions  Pending Prescriptions Disp Refills   fluticasone  furoate-vilanterol (BREO ELLIPTA ) 200-25 MCG/ACT AEPB [Pharmacy Med Name: BREO ELLIPTA  200-25 MCG INHALR] 180 each 0    Sig: TAKE 1 PUFF BY MOUTH EVERY DAY     Pulmonology:  Combination Products Passed - 05/30/2024  1:53 PM      Passed - Valid encounter within last 12 months    Recent Outpatient Visits           3 months ago Contusion of right knee, initial encounter   [facility] Leavy Mole, PA-C   3 months ago Contusion of right knee, initial encounter   [facility] Leavy Mole, PA-C   4 months ago [name] Bernardo Fend, DO   4 months ago Multiple skin tears   [facility] Bernardo Fend, DO       Future Appointments             In 2 months Bernardo Fend, DO [facility], [place]   In 5 months Hester Alm BROCKS, MD [facility]

## 2024-06-04 ENCOUNTER — Other Ambulatory Visit: Payer: Self-pay | Admitting: Internal Medicine

## 2024-06-04 DIAGNOSIS — E785 Hyperlipidemia, unspecified: Secondary | ICD-10-CM

## 2024-06-06 ENCOUNTER — Other Ambulatory Visit: Payer: Self-pay | Admitting: Internal Medicine

## 2024-06-06 DIAGNOSIS — E063 Autoimmune thyroiditis: Secondary | ICD-10-CM

## 2024-06-07 ENCOUNTER — Telehealth: Payer: Self-pay

## 2024-06-07 DIAGNOSIS — E785 Hyperlipidemia, unspecified: Secondary | ICD-10-CM

## 2024-06-07 MED ORDER — EZETIMIBE 10 MG PO TABS: 10.0000 mg | ORAL_TABLET | Freq: Every day | ORAL | 0 refills | Status: DC

## 2024-06-07 NOTE — Telephone Encounter (Signed)
 Copied from CRM [id number]. Topic: Clinical - Prescription Issue >> Jun 07, 2024  8:57 AM Lonell PEDLAR wrote: Reason for CRM: Patient's rx for ezetimibe  (ZETIA ) 10 MG tablet was sent to the incorrect pharmacy. Updated on file, [facility] [address], [facility] 90 day supply.

## 2024-06-07 NOTE — Telephone Encounter (Signed)
 Requested Prescriptions  Refused Prescriptions Disp Refills   ezetimibe  (ZETIA ) 10 MG tablet [Pharmacy Med Name: EZETIMIBE  10 MG TABLET] 90 tablet 1    Sig: TAKE 1 TABLET BY MOUTH EVERY DAY     Cardiovascular:  Antilipid - Sterol Transport Inhibitors Failed - 06/07/2024 12:16 PM      Failed - AST in normal range and within 360 days    AST  Date Value Ref Range Status  04/28/2023 13 10 - 35 U/L Final         Failed - ALT in normal range and within 360 days    ALT  Date Value Ref Range Status  04/28/2023 12 6 - 29 U/L Final         Failed - Lipid Panel in normal range within the last 12 months    Cholesterol  Date Value Ref Range Status  06/04/2021 196 <200 mg/dL Final   LDL Cholesterol (Calc)  Date Value Ref Range Status  06/04/2021 107 (H) mg/dL (calc) Final    Comment:    Reference range: <100 . Desirable range <100 mg/dL for primary prevention;   <70 mg/dL for patients with CHD or diabetic patients  with > or = 2 CHD risk factors. SABRA LDL-C is now calculated using the Martin-Hopkins  calculation, which is a validated novel method providing  better accuracy than the Friedewald equation in the  estimation of LDL-C.  Gladis APPLETHWAITE et al. SANDREA. 7986;689(80): 2061-2068  (http://education.[facility].com/faq/FAQ164)    HDL  Date Value Ref Range Status  06/04/2021 70 > OR = 50 mg/dL Final   Triglycerides  Date Value Ref Range Status  06/04/2021 97 <150 mg/dL Final         Passed - Patient is not pregnant      Passed - Valid encounter within last 12 months    Recent Outpatient Visits           4 months ago Contusion of right knee, initial encounter   [name] Leavy Mole, PA-C   4 months ago Contusion of right knee, initial encounter   [facility] Leavy Mole, PA-C   4 months ago [facility] Bernardo Fend, DO   4 months ago Multiple skin tears   [facility] Bernardo Fend, DO       Future Appointments             In 1 month Bernardo Fend, DO [name], [place]   In 5 months Hester Alm BROCKS, MD [facility]

## 2024-06-09 NOTE — Telephone Encounter (Signed)
 Requested medication (s) are due for refill today: na   Requested medication (s) are on the active medication list: yes   Last refill:  06/11/23 #90 3 refills  Future visit scheduled: yes 08/01/24  Notes to clinic:  expired medication date. Do you want to refill for 1 month or #90 or renew Rx?     Requested Prescriptions  Pending Prescriptions Disp Refills   levothyroxine  (SYNTHROID ) 50 MCG tablet [Pharmacy Med Name: LEVOTHYROXINE  50 MCG TABLET] 90 tablet 3    Sig: TAKE 1 TABLET BY MOUTH EVERY DAY     Endocrinology:  Hypothyroid Agents Failed - 06/09/2024  1:37 PM      Failed - TSH in normal range and within 360 days    TSH  Date Value Ref Range Status  04/28/2023 1.86 0.40 - 4.50 mIU/L Final         Passed - Valid encounter within last 12 months    Recent Outpatient Visits           4 months ago Contusion of right knee, initial encounter   [facility] Leavy Mole, PA-C   4 months ago Contusion of right knee, initial encounter   [facility] Leavy Mole, PA-C   4 months ago [name] Bernardo Fend, DO   4 months ago Multiple skin tears   [facility] Bernardo Fend, DO       Future Appointments             In 1 month Bernardo Fend, DO [facility], [place]   In 5 months Hester Alm BROCKS, MD [facility] [place] [facility]

## 2024-06-09 NOTE — Telephone Encounter (Signed)
 Pt has not had bloodwork done since Oct 2024, needs to be seen sooner?

## 2024-06-10 NOTE — Telephone Encounter (Signed)
 Pt has an appt for 07/2024 for her 6 mth appt, does she need to come in sooner?

## 2024-06-15 DIAGNOSIS — Z961 Presence of intraocular lens: Secondary | ICD-10-CM | POA: Diagnosis not present

## 2024-06-24 DIAGNOSIS — M170 Bilateral primary osteoarthritis of knee: Secondary | ICD-10-CM | POA: Diagnosis not present

## 2024-06-24 DIAGNOSIS — G8929 Other chronic pain: Secondary | ICD-10-CM | POA: Diagnosis not present

## 2024-06-27 ENCOUNTER — Other Ambulatory Visit: Payer: Self-pay | Admitting: Cardiovascular Disease

## 2024-06-28 ENCOUNTER — Other Ambulatory Visit: Payer: Self-pay | Admitting: Cardiovascular Disease

## 2024-06-28 NOTE — Telephone Encounter (Signed)
Please contact pt for future appointment. 

## 2024-06-29 DIAGNOSIS — M48062 Spinal stenosis, lumbar region with neurogenic claudication: Secondary | ICD-10-CM | POA: Diagnosis not present

## 2024-06-29 DIAGNOSIS — M5416 Radiculopathy, lumbar region: Secondary | ICD-10-CM | POA: Diagnosis not present

## 2024-06-29 NOTE — Telephone Encounter (Signed)
 Scheduled 12/12

## 2024-06-30 NOTE — Progress Notes (Unsigned)
 Cardiology Office Note:    Date:  07/02/2024   ID:  Teresa Chambers, DOB 1935/11/24, MRN [id number]  PCP:  Bernardo Fend, DO   [facility] Providers Cardiologist:  Evalene Lunger, MD Cardiology APP:  Lorene Lesley LITTIE DEVONNA     Referring MD: Bernardo Fend, DO   Chief complaint: Annual follow-up     History of Present Illness:   Teresa Chambers is a 88 y.o. female with a hx of CHF, COPD/asthma, HTN, HLD, anemia, PAF, SVT, presenting to office for annual follow-up of chronic cardiac conditions.  Initial record of CHF listed in the 04/2017 office visit with [facility], where they also mentioned congenital heart failure. Do not have access to medical records detailing congenital or congestive heart failure.  Initially referred to cardiology in February 2024 following request from Orvin Daring for further evaluation of chronic systolic CHF and palpitations.  Holter monitoring performed by Dr. Britta where patient was started on Eliquis  was reviewed by Dr. Gollan without clear evidence of atrial fibrillation/flutter.  Patient informed Dr. Gollan that she had previously had 2 heart attacks, continued to have paroxysmal tachycardia with rates up to 180 at home.   Echo 10/06/2022: LVEF 55-60%, normal LV function, no RWMA, G2 DD, normal RV, LA severely dilated, degenerative mitral valve, mild-moderate mitral valve regurgitation, mild-moderate mitral stenosis, mean mitral gradient 6.0 mmHg, moderate mitral annular calcification, tricuspid regurg mild-moderate, AV sclerosis/calcification present without evidence of stenosis.  2 weeks ZIO monitor showing min HR 58 bpm, max HR 190 bpm, average HR 85 bpm, 93 SVT runs occurring, longest lasting 8 minutes 14 seconds, occurring within 45 seconds of patient triggered event.  Atrial flutter occurred at <1% of the burden, longest lasting 13 minutes 25 seconds.  Metoprolol  succinate 25 mg daily was started, Eliquis  increased to 5 mg twice daily.  Most  recently seen in cardiology office 06/29/2023, reported occasional falls where she has been evaluated in the ED.  Maintaining in sinus rhythm at that time. Otherwise doing well.  Presents independently, appears stable from a cardiovascular standpoint. She denies chest pain, palpitations, orthopnea, n, v, dark/tarry/bloody stools, hematuria, dizziness, syncope, edema, weight gain.  She does report occasional DOE, which she states is stable, unchanged, and attributes it to her COPD. She reports occasional lightheadedness around positional changes, which can cause her to feel off balance.  She uses a cane or rollator to assist in stability.  States a fall she sustained in the past were rarely from off balance, but mostly mechanical in nature when her foot gets caught on something.  States the palpitations she has had in the past feel as though they are currently under control, occurring rarely.  Her primary complaint involves daytime sleepiness, feeling as though she wants to take more naps.  States she has been told that she snores, and occasionally stops breathing, but not for long periods of time.  Has 5 children, 3 of which use a CPAP.  Has never been tested for sleep apnea.   ROS:   Please see the history of present illness.    All other systems reviewed and are negative.     Past Medical History:  Diagnosis Date   Actinic keratosis    Acute thoracic back pain    Allergy    Anemia    Arthritis    Asthma    Basal cell carcinoma    nose    Chest pain    unspecified   CHF (congestive heart failure) (HCC)  04/30/2017   Chronic abdominal pain 05/01/2017   unspecified   Congenital heart failure (HCC)    COPD (chronic obstructive pulmonary disease) (HCC)    Disease of upper respiratory system 04/30/2017   Dyspnea on exertion    Emphysema lung (HCC)    Esophageal reflux disease 04/30/2017   History of bone density study 06/28/2004   Leg swelling    Lumbar arthropathy    Nasopharyngitis  04/30/2017   Neuromuscular disorder (HCC)    Osteoarthropathy 04/30/2017   Osteoporosis 04/30/2017   Palpitations    Sinusitis, acute 04/30/2017   unspecified   Squamous cell carcinoma of skin 07/28/2017   Right medial knee. Hypertrophic SCCis. Tx: [facility] 07/28/2017   Thyroid  disease    Hashimoto disease    Past Surgical History:  Procedure Laterality Date   CATARACT EXTRACTION     07/21/2001 - 07/20/2002   CHOLECYSTECTOMY     07/21/1997 - 07/20/1998   COLON SURGERY     COLONOSCOPY     05/05/2000, 01/17/2000 Adenomatous Polyps     COLONOSCOPY     11/05/2009, 12/23/2004, 07/07/2001   PH Adenomatous Polyps: CBF 10/2014; recall ltr mailed 09/18/2014 (dw)    ESOPHAGOGASTRODUODENOSCOPY     11/05/2009, 05/05/2000   EYE SURGERY     FLEXIBLE SIGMOIDOSCOPY  11/28/1999   HAND SURGERY  2006   HERNIA REPAIR     HIP ARTHROPLASTY Right 02/11/2018   Procedure: ARTHROPLASTY BIPOLAR HIP (HEMIARTHROPLASTY);  Surgeon: Edie Norleen PARAS, MD;  Location: [facility] ORS;  Service: Orthopedics;  Laterality: Right;   JOINT REPLACEMENT     LAPAROSCOPIC COLON RESECTION     2001    Current Medications: Active Medications[1]   Allergies:   Codeine and Lidocaine   Social History   Socioeconomic History   Marital status: Divorced    Spouse name: Not on file   Number of children: 5   Years of education: 15.5   Highest education level: Some college, no degree  Occupational History   Occupation: retired  Tobacco Use   Smoking status: Never   Smokeless tobacco: Never  Vaping Use   Vaping status: Never Used  Substance and Sexual Activity   Alcohol use: Not Currently   Drug use: Never   Sexual activity: Not Currently  Other Topics Concern   Not on file  Social History Narrative   Lives independently, has home at [facility] she enjoys traveling to   Social Drivers of Health   Tobacco Use: Low Risk (07/01/2024)   Patient History    Smoking Tobacco Use: Never    Smokeless Tobacco Use: Never    Passive  Exposure: Not on file  Financial Resource Strain: Low Risk  (06/24/2024)   Received from [facility]   Overall Financial Resource Strain (CARDIA)    Difficulty of Paying Living Expenses: Not hard at all  Food Insecurity: No Food Insecurity (06/24/2024)   [facility]   Epic    Within the past 12 months, you worried that your food would run out before you got the money to buy more.: Never true    Within the past 12 months, the food you bought just didn't last and you didn't have money to get more.: Never true  Transportation Needs: No Transportation Needs (06/24/2024)   Received from [facility] - Transportation    In the past 12 months, has lack of transportation kept you from medical appointments or from getting medications?: No  Lack of Transportation (Non-Medical): No  Physical Activity: Inactive (01/24/2024)   Exercise Vital Sign    Days of Exercise per Week: 0 days    Minutes of Exercise per Session: Not on file  Stress: No Stress Concern Present (11/19/2023)   [employer] of [employer]ational [employer] - Occupational Stress Questionnaire    Feeling of Stress : Only a little  Social Connections: Socially Isolated (01/24/2024)   Social Connection and Isolation Panel    Frequency of Communication with Friends and Family: Not on file    Frequency of Social Gatherings with Friends and Family: More than three times a week    Attends Religious Services: Never    Database Administrator or Organizations: No    Attends Engineer, Structural: Not on file    Marital Status: Divorced  Depression (PHQ2-9): Low Risk (01/28/2024)   Depression (PHQ2-9)    PHQ-2 Score: 0  Alcohol Screen: Low Risk (01/24/2024)   Alcohol Screen    Last Alcohol Screening Score (AUDIT): 1  Housing: Low Risk  (06/24/2024)   Received from [facility]   Epic    In the last 12 months, was there a time when you were not able  to pay the mortgage or rent on time?: No    In the past 12 months, how many times have you moved where you were living?: 0    At any time in the past 12 months, were you homeless or living in a shelter (including now)?: No  Utilities: Not At Risk (06/24/2024)   Received from [facility]   Epic    In the past 12 months has the electric, gas, oil, or water company threatened to shut off services in your home?: No  Health Literacy: Adequate Health Literacy (11/19/2023)   B1300 Health Literacy    Frequency of need for help with medical instructions: Never     Family History: The patient's family history includes Heart disease in her father and mother; Hypertension in her daughter and son.  EKGs/Labs/Other Studies Reviewed:    The following studies were reviewed today:  EKG Interpretation Date/Time:  Friday July 01 2024 13:54:27 EST Ventricular Rate:  81 PR Interval:  144 QRS Duration:  66 QT Interval:  368 QTC Calculation: 427 R Axis:   -40  Text Interpretation: Normal sinus rhythm Left axis deviation Low voltage QRS  No significant change from prior studies Confirmed by Elaine Moloney [phone]) on 07/01/2024 1:58:55 PM    Recent Labs: 07/01/2024: BUN 17; Creatinine, Ser 0.85; Hemoglobin 12.6; Platelets 346; Potassium 4.3; Sodium 142; TSH 2.970  Recent Lipid Panel    Component Value Date/Time   CHOL 196 06/04/2021 0910   TRIG 97 06/04/2021 0910   HDL 70 06/04/2021 0910   CHOLHDL 2.8 06/04/2021 0910   VLDL 16 03/29/2019 0951   LDLCALC 107 (H) 06/04/2021 0910     Risk Assessment/Calculations:    CHA2DS2-VASc Score = 5   This indicates a 7.2% annual risk of stroke. The patient's score is based upon: CHF History: 1 HTN History: 1 Diabetes History: 0 Stroke History: 0 Vascular Disease History: 0 Age Score: 2 Gender Score: 1          STOP-Bang Score:  5       Physical Exam:    VS:  BP 100/60 (BP Location: Left Arm, Patient Position: Sitting,  Cuff Size: Normal)   Pulse 81   Ht 5' 6 (1.676 m)   Wt 146  lb (66.2 kg)   SpO2 96%   BMI 23.57 kg/m        Wt Readings from Last 3 Encounters:  07/01/24 146 lb (66.2 kg)  02/05/24 147 lb (66.7 kg)  02/03/24 147 lb (66.7 kg)     GEN:  Well nourished, well developed in no acute distress HEENT: Normal NECK: No carotid bruits CARDIAC: S1-S2 normal, RRR, no murmurs, rubs, gallops RESPIRATORY:  Clear to auscultation without rales, wheezing or rhonchi  MUSCULOSKELETAL:  No edema; No deformity  SKIN: Warm and dry NEUROLOGIC:  Alert and oriented x 3 PSYCHIATRIC:  Normal affect       Assessment & Plan Chronic systolic (congestive) heart failure (HCC) Orthostatic lightheadedness Reported hx of congestive HF, diastolic dysfunction present on echo from last year. Echo 10/06/2022: LVEF 55-60%, normal LV function, no RWMA, G2 DD Denies CP, near-syncope, SOB at rest Reports chronic DOE, stable/unchanged, attributes to her COPD/asthma, states is well-managed with her inhalers Reports postural lightheadedness that resolves after a few seconds, occasionally makes her feel off balance, BP at rest in clinic today 100/60 Weight remains stable Appears euvolemic Continue toprol -xl 25 mg daily Reduce Entresto , take 12-13 mg twice daily, to see if this improves postural lightheadedness. SVT (supraventricular tachycardia) PAF (paroxysmal atrial fibrillation) (HCC) 2 week zio 2024: HR 58 bpm, max HR 190 bpm, average HR 85 bpm, 93 SVT runs occurring, longest lasting 8 minutes 14 seconds, occurring within 45 seconds of patient triggered event.  Atrial flutter occurred at <1% of the burden, longest lasting 13 minutes 25 seconds. Continue eliquis  5 mg twice daily as this is the correct dose for her age (11), weight (66.2 kg), creatinine (0.63) Continue toprol -xl 40 mg daily Reports palpitations well-controlled, occurring rarely. Will check CBC and BMP Essential hypertension BPs reported  well-controlled at home, often low normal with postural lightheadedness Continue toprol -xl 25 mg daily Reduce Entresto  as above Daytime sleepiness Sleep-disordered breathing Reports daytime sleepiness STOPBANG = 5 Reports 3/5 of her children use CPAP Will refer to pulmonology for further sleep apnea eval Hypothyroidism due to Hashimoto's thyroiditis Will check TSH considering palpitations and daytime sleepiness  Disposition: Follow up in three months or sooner if needed.            Medication Adjustments/Labs and Tests Ordered: Current medicines are reviewed at length with the patient today.  Concerns regarding medicines are outlined above.  Orders Placed This Encounter  Procedures   Basic metabolic panel with GFR   CBC   TSH   Ambulatory referral to Pulmonology   EKG 12-Lead   Meds ordered this encounter  Medications   sacubitril -valsartan  (ENTRESTO ) 24-26 MG    Sig: Please take a half tablet in the morning and then the other half tablet in the evening    Dispense:  90 tablet    Refill:  3    Patient Instructions  Medication Instructions:  Your physician recommends the following medication changes.  DECREASE: Entresto  24/26 mg to half tablet in the morning then the remaining half in the evening   *If you need a refill on your cardiac medications before your next appointment, please call your pharmacy*  Lab Work: Your provider would like for you to have following labs drawn today CBC, BMP, and TSH.   If you have labs (blood work) drawn today and your tests are completely normal, you will receive your results only by: MyChart Message (if you have MyChart) OR A paper copy in the mail If you have any lab  test that is abnormal or we need to change your treatment, we will call you to review the results.  Follow-Up: At [facility], you and your health needs are our priority.  As part of our continuing mission to provide you with exceptional heart care, our  providers are all part of one team.  This team includes your primary Cardiologist (physician) and Advanced Practice Providers or APPs (Physician Assistants and Nurse Practitioners) who all work together to provide you with the care you need, when you need it.  Your next appointment:   3 month(s)  Provider:   You may see Timothy Gollan, MD or Lonni Meager, NP   Signed, Daphne Karrer E Sruti Ayllon, NP  07/02/2024 1:01 PM    [facility]     [1]  Current Meds  Medication Sig   acetaminophen  (TYLENOL  8 HOUR ARTHRITIS PAIN) 650 MG CR tablet    albuterol  (VENTOLIN  HFA) 108 (90 Base) MCG/ACT inhaler TAKE 2 PUFFS BY MOUTH EVERY 6 HOURS AS NEEDED FOR WHEEZE OR SHORTNESS OF BREATH   DULoxetine  (CYMBALTA ) 60 MG capsule Take 1 capsule (60 mg total) by mouth daily.   ELIQUIS  5 MG TABS tablet TAKE 1 TABLET BY MOUTH TWICE A DAY   ezetimibe  (ZETIA ) 10 MG tablet Take 1 tablet (10 mg total) by mouth daily.   fluticasone  (FLONASE ) 50 MCG/ACT nasal spray SHAKE LIQUID AND USE 2 SPRAYS IN EACH NOSTRIL DAILY   fluticasone  furoate-vilanterol (BREO ELLIPTA ) 200-25 MCG/ACT AEPB TAKE 1 PUFF BY MOUTH EVERY DAY   levothyroxine  (SYNTHROID ) 50 MCG tablet TAKE 1 TABLET BY MOUTH EVERY DAY   metoprolol  succinate (TOPROL -XL) 25 MG 24 hr tablet TAKE 1 TABLET (25 MG TOTAL) BY MOUTH DAILY.   Multiple Vitamin (MULTIVITAMIN) tablet Take 1 tablet by mouth daily.   pantoprazole  (PROTONIX ) 40 MG tablet TAKE 1 TABLET BY MOUTH EVERY DAY   traMADol  (ULTRAM ) 50 MG tablet    [DISCONTINUED] sacubitril -valsartan  (ENTRESTO ) 24-26 MG TAKE 1 TABLET BY MOUTH TWICE A DAY

## 2024-07-01 ENCOUNTER — Encounter: Payer: Self-pay | Admitting: Nurse Practitioner

## 2024-07-01 ENCOUNTER — Ambulatory Visit: Attending: Nurse Practitioner | Admitting: Nurse Practitioner

## 2024-07-01 VITALS — BP 100/60 | HR 81 | Ht 66.0 in | Wt 146.0 lb

## 2024-07-01 DIAGNOSIS — I872 Venous insufficiency (chronic) (peripheral): Secondary | ICD-10-CM

## 2024-07-01 DIAGNOSIS — I471 Supraventricular tachycardia, unspecified: Secondary | ICD-10-CM

## 2024-07-01 DIAGNOSIS — R400 Somnolence: Secondary | ICD-10-CM | POA: Diagnosis not present

## 2024-07-01 DIAGNOSIS — I480 Paroxysmal atrial fibrillation: Secondary | ICD-10-CM

## 2024-07-01 DIAGNOSIS — I5022 Chronic systolic (congestive) heart failure: Secondary | ICD-10-CM | POA: Diagnosis not present

## 2024-07-01 DIAGNOSIS — Z79899 Other long term (current) drug therapy: Secondary | ICD-10-CM

## 2024-07-01 DIAGNOSIS — E785 Hyperlipidemia, unspecified: Secondary | ICD-10-CM

## 2024-07-01 DIAGNOSIS — G4730 Sleep apnea, unspecified: Secondary | ICD-10-CM | POA: Diagnosis not present

## 2024-07-01 DIAGNOSIS — R42 Dizziness and giddiness: Secondary | ICD-10-CM

## 2024-07-01 DIAGNOSIS — E063 Autoimmune thyroiditis: Secondary | ICD-10-CM | POA: Diagnosis not present

## 2024-07-01 DIAGNOSIS — I10 Essential (primary) hypertension: Secondary | ICD-10-CM

## 2024-07-01 MED ORDER — SACUBITRIL-VALSARTAN 24-26 MG PO TABS: ORAL_TABLET | ORAL | 3 refills | Status: AC

## 2024-07-01 NOTE — Patient Instructions (Signed)
 Medication Instructions:  Your physician recommends the following medication changes.  DECREASE: Entresto  24/26 mg to half tablet in the morning then the remaining half in the evening   *If you need a refill on your cardiac medications before your next appointment, please call your pharmacy*  Lab Work: Your provider would like for you to have following labs drawn today CBC, BMP, and TSH.   If you have labs (blood work) drawn today and your tests are completely normal, you will receive your results only by: MyChart Message (if you have MyChart) OR A paper copy in the mail If you have any lab test that is abnormal or we need to change your treatment, we will call you to review the results.  Follow-Up: At [facility], you and your health needs are our priority.  As part of our continuing mission to provide you with exceptional heart care, our providers are all part of one team.  This team includes your primary Cardiologist (physician) and Advanced Practice Providers or APPs (Physician Assistants and Nurse Practitioners) who all work together to provide you with the care you need, when you need it.  Your next appointment:   3 month(s)  Provider:   You may see Timothy Gollan, MD or Lonni Meager, NP

## 2024-07-02 LAB — BASIC METABOLIC PANEL WITH GFR
BUN/Creatinine Ratio: 20 (ref 12–28)
BUN: 17 mg/dL (ref 8–27)
CO2: 24 mmol/L (ref 20–29)
Calcium: 9.2 mg/dL (ref 8.7–10.3)
Chloride: 101 mmol/L (ref 96–106)
Creatinine, Ser: 0.85 mg/dL (ref 0.57–1.00)
Glucose: 87 mg/dL (ref 70–99)
Potassium: 4.3 mmol/L (ref 3.5–5.2)
Sodium: 142 mmol/L (ref 134–144)
eGFR: 67 mL/min/1.73 (ref >59)

## 2024-07-02 LAB — CBC
Hematocrit: 38.2 % (ref 34.0–46.6)
Hemoglobin: 12.6 g/dL (ref 11.1–15.9)
MCH: 33.1 pg — ABNORMAL HIGH (ref 26.6–33.0)
MCHC: 33 g/dL (ref 31.5–35.7)
MCV: 100 fL — ABNORMAL HIGH (ref 79–97)
Platelets: 346 x10E3/uL (ref 150–450)
RBC: 3.81 x10E6/uL (ref 3.77–5.28)
RDW: 12.7 % (ref 11.7–15.4)
WBC: 11.4 x10E3/uL — ABNORMAL HIGH (ref 3.4–10.8)

## 2024-07-02 LAB — TSH: TSH: 2.97 u[IU]/mL (ref 0.450–4.500)

## 2024-07-02 NOTE — Assessment & Plan Note (Signed)
 Reported hx of congestive HF, diastolic dysfunction present on echo from last year. Echo 10/06/2022: LVEF 55-60%, normal LV function, no RWMA, G2 DD Denies CP, near-syncope, SOB at rest Reports chronic DOE, stable/unchanged, attributes to her COPD/asthma, states is well-managed with her inhalers Reports postural lightheadedness that resolves after a few seconds, occasionally makes her feel off balance, BP at rest in clinic today 100/60 Weight remains stable Appears euvolemic Continue toprol -xl 25 mg daily Reduce Entresto , take 12-13 mg twice daily, to see if this improves postural lightheadedness.

## 2024-07-02 NOTE — Assessment & Plan Note (Signed)
 Will check TSH considering palpitations and daytime sleepiness

## 2024-07-02 NOTE — Assessment & Plan Note (Signed)
 BPs reported well-controlled at home, often low normal with postural lightheadedness Continue toprol -xl 25 mg daily Reduce Entresto  as above

## 2024-07-03 ENCOUNTER — Other Ambulatory Visit: Payer: Self-pay | Admitting: Internal Medicine

## 2024-07-03 DIAGNOSIS — E063 Autoimmune thyroiditis: Secondary | ICD-10-CM

## 2024-07-04 ENCOUNTER — Ambulatory Visit: Payer: Self-pay | Admitting: Emergency Medicine

## 2024-07-05 NOTE — Telephone Encounter (Signed)
 Requested Prescriptions  Pending Prescriptions Disp Refills   levothyroxine  (SYNTHROID ) 50 MCG tablet [Pharmacy Med Name: LEVOTHYROXINE  50 MCG TABLET] 90 tablet 0    Sig: TAKE 1 TABLET BY MOUTH EVERY DAY     Endocrinology:  Hypothyroid Agents Passed - 07/05/2024  4:01 PM      Passed - TSH in normal range and within 360 days    TSH  Date Value Ref Range Status  07/01/2024 2.970 0.450 - 4.500 uIU/mL Final         Passed - Valid encounter within last 12 months    Recent Outpatient Visits           5 months ago Contusion of right knee, initial encounter   [facility] Leavy Mole, PA-C   5 months ago Contusion of right knee, initial encounter   [facility] Leavy Mole, PA-C   5 months ago [facility] Bernardo Fend, DO   5 months ago Multiple skin tears   [facility] Bernardo Fend, DO       Future Appointments             In 3 weeks Bernardo Fend, DO [facility] [place]   In 2 months [name], [name], NP [facility] [place]   In 4 months Hester Alm BROCKS, MD [facility]

## 2024-07-06 ENCOUNTER — Encounter: Payer: Self-pay | Admitting: Internal Medicine

## 2024-07-07 ENCOUNTER — Other Ambulatory Visit: Payer: Self-pay | Admitting: Internal Medicine

## 2024-07-07 DIAGNOSIS — K219 Gastro-esophageal reflux disease without esophagitis: Secondary | ICD-10-CM

## 2024-07-12 ENCOUNTER — Ambulatory Visit (INDEPENDENT_AMBULATORY_CARE_PROVIDER_SITE_OTHER): Admitting: Sleep Medicine

## 2024-07-12 ENCOUNTER — Encounter: Payer: Self-pay | Admitting: Sleep Medicine

## 2024-07-12 VITALS — BP 90/60 | HR 95 | Temp 97.9°F | Ht 66.0 in | Wt 143.0 lb

## 2024-07-12 DIAGNOSIS — I110 Hypertensive heart disease with heart failure: Secondary | ICD-10-CM | POA: Diagnosis not present

## 2024-07-12 DIAGNOSIS — I509 Heart failure, unspecified: Secondary | ICD-10-CM

## 2024-07-12 DIAGNOSIS — I10 Essential (primary) hypertension: Secondary | ICD-10-CM

## 2024-07-12 DIAGNOSIS — G4733 Obstructive sleep apnea (adult) (pediatric): Secondary | ICD-10-CM | POA: Diagnosis not present

## 2024-07-12 DIAGNOSIS — I5082 Biventricular heart failure: Secondary | ICD-10-CM

## 2024-07-12 NOTE — Patient Instructions (Signed)
 SABRA

## 2024-07-12 NOTE — Telephone Encounter (Signed)
 Requested Prescriptions  Pending Prescriptions Disp Refills   pantoprazole  (PROTONIX ) 40 MG tablet [Pharmacy Med Name: PANTOPRAZOLE  SOD DR 40 MG TAB] 90 tablet 0    Sig: TAKE 1 TABLET BY MOUTH EVERY DAY     Gastroenterology: Proton Pump Inhibitors Passed - 07/12/2024  9:45 AM      Passed - Valid encounter within last 12 months    Recent Outpatient Visits           5 months ago Contusion of right knee, initial encounter   [facility] Leavy Mole, PA-C   5 months ago Contusion of right knee, initial encounter   [name] Leavy Mole, PA-C   5 months ago [facility] Bernardo Fend, DO   5 months ago Multiple skin tears   [facility] Bernardo Fend, DO       Future Appointments             In 2 weeks Bernardo Fend, DO [facility] [place]   In 2 months [name], [name], NP [facility] [place]   In 4 months Hester Alm BROCKS, MD [facility]

## 2024-07-12 NOTE — Progress Notes (Signed)
 "      Name:Teresa Chambers MRN: [id number] DOB: 1936/05/08   CHIEF COMPLAINT:  EXCESSIVE DAYTIME SLEEPINESS   HISTORY OF PRESENT ILLNESS: Teresa Chambers is a 88 y.o. w/ a h/o CAD, CHF, HTN, asthma and hypothyroidism who presents for c/o loud snoring which has been present for several years. Reports nocturnal awakenings due to nocturia, however does not have difficulty falling back to sleep. Reports significant weight changes. Admits to night sweats and dry mouth. Denies morning headaches, RLS symptoms, dream enactment, cataplexy, hypnagogic or hypnapompic hallucinations. Reports a family history of sleep apnea. Denies drowsy driving. Drinks 1 cup of decaf coffee daily, denies alcohol, tobacco or illicit drug use.   Bedtime 8-9 pm Sleep onset 3-4 hours Rise time 9-10 am   EPWORTH SLEEP SCORE 3    07/12/2024   11:00 AM  Results of the Epworth flowsheet  Sitting and reading 0  Watching TV 2  Sitting, inactive in a public place (e.g. a theatre or a meeting) 0  As a passenger in a car for an hour without a break 0  Lying down to rest in the afternoon when circumstances permit 1  Sitting and talking to someone 0  Sitting quietly after a lunch without alcohol 0  In a car, while stopped for a few minutes in traffic 0  Total score 3    PAST MEDICAL HISTORY :   has a past medical history of Actinic keratosis, Acute thoracic back pain, Allergy, Anemia, Arrhythmia, Arthritis, Asthma, Basal cell carcinoma, Chest pain, CHF (congestive heart failure) (HCC) (04/30/2017), Chronic abdominal pain (05/01/2017), Clotting disorder, Congenital heart failure (HCC), COPD (chronic obstructive pulmonary disease) (HCC), Disease of upper respiratory system (04/30/2017), Dyspnea on exertion, Emphysema lung (HCC), Esophageal reflux disease (04/30/2017), History of bone density study (06/28/2004), Leg swelling, Lumbar arthropathy, Nasopharyngitis (04/30/2017), Neuromuscular disorder (HCC), Osteoarthropathy  (04/30/2017), Osteoporosis (04/30/2017), Palpitations, Sinusitis, acute (04/30/2017), Squamous cell carcinoma of skin (07/28/2017), and Thyroid  disease.  has a past surgical history that includes Colon surgery; Hernia repair; Cholecystectomy; Hip Arthroplasty (Right, 02/11/2018); Colonoscopy; Colonoscopy; Laparoscopic colon resection; Cataract extraction; Hand surgery (2006); Esophagogastroduodenoscopy; Flexible sigmoidoscopy (11/28/1999); Joint replacement; Eye surgery; and Fracture surgery. Prior to Admission medications  Medication Sig Start Date End Date Taking? Authorizing Provider  acetaminophen  (TYLENOL  8 HOUR ARTHRITIS PAIN) 650 MG CR tablet    Yes [provider]  albuterol  (VENTOLIN  HFA) 108 (90 Base) MCG/ACT inhaler TAKE 2 PUFFS BY MOUTH EVERY 6 HOURS AS NEEDED FOR WHEEZE OR SHORTNESS OF BREATH 06/11/23  Yes Bernardo Fend, DO  DULoxetine  (CYMBALTA ) 60 MG capsule Take 1 capsule (60 mg total) by mouth daily. 01/28/24  Yes Bernardo Fend, DO  ELIQUIS  5 MG TABS tablet TAKE 1 TABLET BY MOUTH TWICE A DAY 05/06/24  Yes Gollan, Timothy J, MD  ezetimibe  (ZETIA ) 10 MG tablet Take 1 tablet (10 mg total) by mouth daily. 06/07/24  Yes Bernardo Fend, DO  fluticasone  (FLONASE ) 50 MCG/ACT nasal spray SHAKE LIQUID AND USE 2 SPRAYS IN EACH NOSTRIL DAILY 02/10/24  Yes Bernardo Fend, DO  fluticasone  furoate-vilanterol (BREO ELLIPTA ) 200-25 MCG/ACT AEPB TAKE 1 PUFF BY MOUTH EVERY DAY 05/30/24  Yes Bernardo Fend, DO  levothyroxine  (SYNTHROID ) 50 MCG tablet TAKE 1 TABLET BY MOUTH EVERY DAY 07/05/24  Yes Bernardo Fend, DO  metoprolol  succinate (TOPROL -XL) 25 MG 24 hr tablet TAKE 1 TABLET (25 MG TOTAL) BY MOUTH DAILY. 06/28/24  Yes Gollan, Timothy J, MD  Multiple Vitamin (MULTIVITAMIN) tablet Take 1 tablet by mouth daily.   Yes  [provider]  pantoprazole  (PROTONIX ) 40 MG tablet TAKE 1 TABLET BY MOUTH EVERY DAY 07/12/24  Yes Bernardo Fend, DO  sacubitril -valsartan   (ENTRESTO ) 24-26 MG Please take a half tablet in the morning and then the other half tablet in the evening 07/01/24  Yes Campbell, Kenzie E, NP  traMADol  (ULTRAM ) 50 MG tablet  01/16/21  Yes [provider]   Allergies[1]  FAMILY HISTORY:  family history includes Diabetes in her maternal grandmother and son; Heart disease in her father and mother; Hypertension in her daughter and son. SOCIAL HISTORY:  reports that she has never smoked. She has never used smokeless tobacco. She reports that she does not currently use alcohol. She reports that she does not use drugs.   Review of Systems:  Gen:  Denies  fever, sweats, chills weight loss  HEENT: Denies blurred vision, double vision, ear pain, eye pain, hearing loss, nose bleeds, sore throat Cardiac:  No dizziness, chest pain or heaviness, chest tightness,edema, No JVD Resp:   No cough, -sputum production, -shortness of breath,-wheezing, -hemoptysis,  Gi: Denies swallowing difficulty, stomach pain, nausea or vomiting, diarrhea, constipation, bowel incontinence Gu:  Denies bladder incontinence, burning urine Ext:   Denies Joint pain, stiffness or swelling Skin: Denies  skin rash, easy bruising or bleeding or hives Endoc:  Denies polyuria, polydipsia , polyphagia or weight change Psych:   Denies depression, insomnia or hallucinations  Other:  All other systems negative  VITAL SIGNS: BP 90/60   Pulse 95   Temp 97.9 F (36.6 C)   Ht 5' 6 (1.676 m)   Wt 143 lb (64.9 kg)   SpO2 97%   BMI 23.08 kg/m    Physical Examination:   General Appearance: No distress  EYES PERRLA, EOM intact.   NECK Supple, No JVD Pulmonary: normal breath sounds, No wheezing.  CardiovascularNormal S1,S2.  No m/r/g.   Abdomen: Benign, Soft, non-tender. Skin:   warm, no rashes, no ecchymosis  Extremities: normal, no cyanosis, clubbing. Neuro:without focal findings,  speech normal  PSYCHIATRIC: Mood, affect within normal limits.   ASSESSMENT AND  PLAN  OSA I suspect that OSA is likely present due to clinical presentation. Discussed the consequences of untreated sleep apnea. Advised not to drive drowsy for safety of patient and others. Will complete further evaluation with a split night study and follow up to review results.    HTN Stable, on current management. Following with PCP.   CHF Stable, following with cardiology.    MEDICATION ADJUSTMENTS/LABS AND TESTS ORDERED: Recommend Sleep Study   Patient  satisfied with Plan of action and management. All questions answered  Follow up to review sleep study results and treatment plan.   I spent a total of 57 minutes reviewing chart data, face-to-face evaluation with the patient, counseling and coordination of care as detailed above.    Teresa Chambers, M.D.  Sleep Medicine [facility]           [1]  Allergies Allergen Reactions   Codeine Nausea And Vomiting   Lidocaine Swelling and Anaphylaxis   "

## 2024-07-22 ENCOUNTER — Other Ambulatory Visit: Payer: Self-pay | Admitting: Internal Medicine

## 2024-07-22 DIAGNOSIS — K219 Gastro-esophageal reflux disease without esophagitis: Secondary | ICD-10-CM

## 2024-07-22 NOTE — Telephone Encounter (Signed)
 Requested Prescriptions  Refused Prescriptions Disp Refills   pantoprazole  (PROTONIX ) 40 MG tablet [Pharmacy Med Name: PANTOPRAZOLE  SOD DR 40 MG TAB] 90 tablet 0    Sig: TAKE 1 TABLET BY MOUTH EVERY DAY     Gastroenterology: Proton Pump Inhibitors Passed - 07/22/2024  4:03 PM      Passed - Valid encounter within last 12 months    Recent Outpatient Visits           5 months ago Contusion of right knee, initial encounter   [facility] Leavy Mole, PA-C   5 months ago Contusion of right knee, initial encounter   [name] Leavy Mole, PA-C   5 months ago [facility] Bernardo Fend, DO   6 months ago Multiple skin tears   [facility] Bernardo Fend, DO       Future Appointments             In 1 week Bernardo Fend, DO [facility] [place]   In 2 months [name], [name], NP [facility] [place]   In 3 months Hester Alm BROCKS, MD [name]

## 2024-07-30 ENCOUNTER — Other Ambulatory Visit: Payer: Self-pay | Admitting: Internal Medicine

## 2024-07-30 DIAGNOSIS — M47816 Spondylosis without myelopathy or radiculopathy, lumbar region: Secondary | ICD-10-CM

## 2024-08-01 ENCOUNTER — Encounter: Payer: Self-pay | Admitting: Internal Medicine

## 2024-08-01 ENCOUNTER — Ambulatory Visit: Admitting: Internal Medicine

## 2024-08-01 ENCOUNTER — Other Ambulatory Visit: Payer: Self-pay

## 2024-08-01 VITALS — BP 108/70 | HR 91 | Temp 97.8°F | Resp 14 | Ht 66.0 in | Wt 145.3 lb

## 2024-08-01 DIAGNOSIS — B351 Tinea unguium: Secondary | ICD-10-CM

## 2024-08-01 DIAGNOSIS — M47816 Spondylosis without myelopathy or radiculopathy, lumbar region: Secondary | ICD-10-CM | POA: Diagnosis not present

## 2024-08-01 DIAGNOSIS — D72829 Elevated white blood cell count, unspecified: Secondary | ICD-10-CM | POA: Diagnosis not present

## 2024-08-01 DIAGNOSIS — S46011D Strain of muscle(s) and tendon(s) of the rotator cuff of right shoulder, subsequent encounter: Secondary | ICD-10-CM | POA: Diagnosis not present

## 2024-08-01 DIAGNOSIS — E785 Hyperlipidemia, unspecified: Secondary | ICD-10-CM | POA: Diagnosis not present

## 2024-08-01 DIAGNOSIS — J449 Chronic obstructive pulmonary disease, unspecified: Secondary | ICD-10-CM | POA: Diagnosis not present

## 2024-08-01 MED ORDER — EZETIMIBE 10 MG PO TABS: 10.0000 mg | ORAL_TABLET | Freq: Every day | ORAL | 0 refills | Status: AC

## 2024-08-01 MED ORDER — DULOXETINE HCL 60 MG PO CPEP: 60.0000 mg | ORAL_CAPSULE | Freq: Every day | ORAL | 1 refills | Status: AC

## 2024-08-01 MED ORDER — FLUTICASONE FUROATE-VILANTEROL 200-25 MCG/ACT IN AEPB: 1.0000 | INHALATION_SPRAY | Freq: Every day | RESPIRATORY_TRACT | 1 refills | Status: AC

## 2024-08-01 NOTE — Progress Notes (Signed)
 "  Established Patient Office Visit  Subjective   Patient ID: Teresa Chambers, female    DOB: [birth year]  Age: 89 y.o. MRN: [id number]  Chief Complaint  Patient presents with   Medical Management of Chronic Issues    6 month recheck    [facility] presents to follow up.   Discussed the use of AI scribe software for clinical note transcription with the patient, who gave verbal consent to proceed.  History of Present Illness  Teresa Chambers is an 89 year old female who presents with elevated white blood cell count and shoulder pain.  She has ongoing weakness and dizziness. Her cardiologist recently adjusted her blood pressure medication, and her blood pressure today is 108/70, but she still feels weak.  She has significant right shoulder pain since a fall in June with a known rotator cuff tear. Pain radiates down the arm with decreased range of motion, and she has difficulty lifting the arm and performing daily activities. She completed physical therapy and used a door pulley system with minimal improvement.  Her white blood cell count was 11.4 on July 01, 2024. She has had no recent respiratory or urinary symptoms. She wonders if the elevation could be related to shoulder inflammation or a dental problem. She has a broken tooth that was recommended for extraction but has deferred due to concern about jaw support.  She has toenail fungus that has not improved with over-the-counter treatments and is interested in oral antifungal medication.  She has nocturia 4 to 5 times per night but not during the day and notes side pain. Kidney function was checked about a month ago. She uses a heating pad for back pain.  Her current medications include Entresto , Eliquis , Breo, and Cymbalta .   Hypertension/CHF/A. Fib: -Follows with Cardiology -Medications: Entresto , Eliquis  5 mg BID, Metoprolol  25 mg -Patient is compliant with above medications and reports no side effects. -Denies  any SOB, CP, vision changes, LE edema or symptoms of hypotension  HLD: -Medications: Zetia  10 mg -Patient is compliant with above medications and reports no side effects.  -Last lipid panel: Lipid Panel     Component Value Date/Time   CHOL 196 06/04/2021 0910   TRIG 97 06/04/2021 0910   HDL 70 06/04/2021 0910   CHOLHDL 2.8 06/04/2021 0910   VLDL 16 03/29/2019 0951   LDLCALC 107 (H) 06/04/2021 0910   COPD: -COPD status: stable -Current medications: Breo Ellipta , Albuterol  PRN -Satisfied with current treatment?: yes -Oxygen use: no -Dyspnea frequency: rarely -Cough frequency: none -Rescue inhaler frequency:  none -Limitation of activity: no -Pneumovax: Up to Date -Influenza: Up to Date  Hypothyroidism: -Medications: Levothyroxine  50 mcg  -Patient is compliant with the above medication (s) at the above dose and reports no medication side effects.  -Denies weight changes, cold./heat intolerance, skin changes, anxiety/palpitations  -Last TSH: 12/25 2.970  GERD: -Had been on Protonix  40 mg, patient doing well and symptoms well controlled  Chronic Venous Insufficiency: -Follows with vascular  Arthritis/Lumbar Radiculopathy: -Currently on Cymbalta  60 mg. Lyrica  discontinued at LOV -Also on Tramadol  50 mg PRN but pain is not well controlled  Patient Active Problem List   Diagnosis Date Noted   Atrial flutter, unspecified type (HCC) 07/30/2023   Palpitation 05/02/2022   Cellulitis and abscess of lower extremity 02/20/2021   Raynaud's disease without gangrene 12/31/2020   Hypothyroidism due to Hashimoto's thyroiditis 06/11/2020   Callus of foot 05/28/2020   Essential hypertension 05/28/2020  Annual physical exam 05/28/2020   Major depression, recurrent, chronic 04/09/2020   Pneumonia 11/10/2019   Sepsis without acute organ dysfunction (HCC) 11/10/2019   Leukocytosis 10/23/2019   Chronic systolic (congestive) heart failure (HCC) 10/22/2019   Closed fracture of multiple  pubic rami, left, initial encounter (HCC) 10/22/2019   Closed fracture of proximal end of right humerus 01/10/2019   Chronic venous insufficiency 09/06/2018   Lymphedema 09/06/2018   GERD without esophagitis 03/10/2018   Bilateral dry eyes 03/10/2018   Chronic obstructive pulmonary disease (HCC) 03/04/2018   Acute exacerbation of COPD with asthma (HCC) 02/22/2018   Chronic anemia 02/22/2018   Chronic constipation 02/22/2018   Chronic bilateral thoracic back pain 02/22/2018   Closed displaced fracture of right femoral neck (HCC) 02/22/2018   CHF (congestive heart failure) (HCC)    Status post hip hemiarthroplasty 02/12/2018   Hip fracture (HCC) 02/11/2018   Asthma 04/30/2017   Chronic reflux esophagitis 04/30/2017   Dyslipidemia 04/30/2017   Osteoporosis 04/30/2017   Cardiac murmur, previously undiagnosed 04/30/2017   Diverticulosis of small intestine without hemorrhage 04/30/2017   Dyspnea on exertion 04/30/2017   Hx of adenomatous colonic polyps 04/30/2017   Hx of cardiomegaly 04/30/2017   Lumbar arthropathy 04/30/2017   Nasopharyngitis 04/30/2017   Nonspecific chest pain 04/30/2017   Osteoarthropathy 04/30/2017   Leg swelling 04/30/2017   Sinusitis, acute 04/30/2017   History of bone density study 06/28/2004   Past Medical History:  Diagnosis Date   Actinic keratosis    Acute thoracic back pain    Allergy    Anemia    Arrhythmia    Arthritis    Asthma    Basal cell carcinoma    nose    Chest pain    unspecified   CHF (congestive heart failure) (HCC) 04/30/2017   Chronic abdominal pain 05/01/2017   unspecified   Clotting disorder    Congenital heart failure (HCC)    COPD (chronic obstructive pulmonary disease) (HCC)    Disease of upper respiratory system 04/30/2017   Dyspnea on exertion    Emphysema lung (HCC)    Esophageal reflux disease 04/30/2017   History of bone density study 06/28/2004   Leg swelling    Lumbar arthropathy    Nasopharyngitis 04/30/2017    Neuromuscular disorder (HCC)    Osteoarthropathy 04/30/2017   Osteoporosis 04/30/2017   Palpitations    Sinusitis, acute 04/30/2017   unspecified   Squamous cell carcinoma of skin 07/28/2017   Right medial knee. Hypertrophic SCCis. Tx: [facility] 07/28/2017   Thyroid  disease    Hashimoto disease   Past Surgical History:  Procedure Laterality Date   CATARACT EXTRACTION     07/21/2001 - 07/20/2002   CHOLECYSTECTOMY     07/21/1997 - 07/20/1998   COLON SURGERY     COLONOSCOPY     05/05/2000, 01/17/2000 Adenomatous Polyps     COLONOSCOPY     11/05/2009, 12/23/2004, 07/07/2001   PH Adenomatous Polyps: [facility] 10/2014; recall ltr mailed 09/18/2014 (dw)    ESOPHAGOGASTRODUODENOSCOPY     11/05/2009, 05/05/2000   EYE SURGERY     FLEXIBLE SIGMOIDOSCOPY  11/28/1999   FRACTURE SURGERY     HAND SURGERY  2006   HERNIA REPAIR     HIP ARTHROPLASTY Right 02/11/2018   Procedure: ARTHROPLASTY BIPOLAR HIP (HEMIARTHROPLASTY);  Surgeon: Edie Norleen PARAS, MD;  Location: [facility] ORS;  Service: Orthopedics;  Laterality: Right;   JOINT REPLACEMENT     LAPAROSCOPIC COLON RESECTION     2001   Social  History   Tobacco Use   Smoking status: Never   Smokeless tobacco: Never  Vaping Use   Vaping status: Never Used  Substance Use Topics   Alcohol use: Not Currently   Drug use: Never   Social History   Socioeconomic History   Marital status: Divorced    Spouse name: Not on file   Number of children: 5   Years of education: 15.5   Highest education level: Some college, no degree  Occupational History   Occupation: retired  Tobacco Use   Smoking status: Never   Smokeless tobacco: Never  Vaping Use   Vaping status: Never Used  Substance and Sexual Activity   Alcohol use: Not Currently   Drug use: Never   Sexual activity: Not Currently  Other Topics Concern   Not on file  Social History Narrative   Lives independently, has home at [facility] she enjoys traveling to   Social Drivers of Health   Tobacco  Use: Low Risk (08/01/2024)   Patient History    Smoking Tobacco Use: Never    Smokeless Tobacco Use: Never    Passive Exposure: Not on file  Financial Resource Strain: Low Risk (07/28/2024)   Overall Financial Resource Strain (CARDIA)    Difficulty of Paying Living Expenses: Not hard at all  Food Insecurity: No Food Insecurity (07/28/2024)   Epic    Worried About Radiation Protection Practitioner of Food in the Last Year: Never true    Ran Out of Food in the Last Year: Never true  Transportation Needs: No Transportation Needs (07/28/2024)   Epic    Lack of Transportation (Medical): No    Lack of Transportation (Non-Medical): No  Physical Activity: Inactive (07/28/2024)   Exercise Vital Sign    Days of Exercise per Week: 0 days    Minutes of Exercise per Session: Not on file  Stress: Stress Concern Present (07/28/2024)   [employer] of Occupational Health - Occupational Stress Questionnaire    Feeling of Stress: To some extent  Social Connections: Moderately Isolated (07/28/2024)   Social Connection and Isolation Panel    Frequency of Communication with Friends and Family: More than three times a week    Frequency of Social Gatherings with Friends and Family: Twice a week    Attends Religious Services: 1 to 4 times per year    Active Member of [employer] or Organizations: No    Attends Banker Meetings: Not on file    Marital Status: Widowed  Intimate Partner Violence: Not At Risk (11/19/2023)   Humiliation, Afraid, Rape, and Kick questionnaire    Fear of Current or Ex-Partner: No    Emotionally Abused: No    Physically Abused: No    Sexually Abused: No  Depression (PHQ2-9): Low Risk (08/01/2024)   Depression (PHQ2-9)    PHQ-2 Score: 0  Alcohol Screen: Low Risk (01/24/2024)   Alcohol Screen    Last Alcohol Screening Score (AUDIT): 1  Housing: Low Risk (07/28/2024)   Epic    Unable to Pay for Housing in the Last Year: No    Number of Times Moved in the Last Year: 0    Homeless in the Last Year: No   Utilities: Not At Risk (06/24/2024)   Received from [facility]   Epic    In the past 12 months has the electric, gas, oil, or water company threatened to shut off services in your home?: No  Health Literacy: Adequate Health Literacy (11/19/2023)   B1300  Health Literacy    Frequency of need for help with medical instructions: Never   Family Status  Relation Name Status   Mother Landon Lunger Deceased   Father Rex Lunger Deceased   Son Ron (Not Specified)   Daughter Mliss (Not Specified)   [employer] Alive  No partnership data on file   Family History  Problem Relation Age of Onset   Heart disease Mother    Heart disease Father    Hypertension Son    Diabetes Son    Hypertension Daughter    Diabetes Maternal Grandmother    Allergies  Allergen Reactions   Codeine Nausea And Vomiting   Lidocaine Swelling and Anaphylaxis      Review of Systems  Musculoskeletal:  Positive for joint pain.  All other systems reviewed and are negative.     Objective:     BP 108/70 (Cuff Size: Large)   Pulse 91   Temp 97.8 F (36.6 C) (Oral)   Resp 14   Ht 5' 6 (1.676 m)   Wt 145 lb 4.8 oz (65.9 kg)   SpO2 97%   BMI 23.45 kg/m  BP Readings from Last 3 Encounters:  08/01/24 108/70  07/12/24 90/60  07/01/24 100/60   Wt Readings from Last 3 Encounters:  08/01/24 145 lb 4.8 oz (65.9 kg)  07/12/24 143 lb (64.9 kg)  07/01/24 146 lb (66.2 kg)      Physical Exam Constitutional:      Appearance: Normal appearance.  HENT:     Head: Normocephalic and atraumatic.  Eyes:     Conjunctiva/sclera: Conjunctivae normal.  Cardiovascular:     Rate and Rhythm: Normal rate and regular rhythm.  Pulmonary:     Effort: Pulmonary effort is normal.     Breath sounds: Normal breath sounds.  Skin:    General: Skin is warm and dry.     Findings: Bruising present.  Neurological:     General: No focal deficit present.     Mental Status: She is alert. Mental status is  at baseline.  Psychiatric:        Mood and Affect: Mood normal.        Behavior: Behavior normal.      No results found for any visits on 08/01/24.  Last CBC Lab Results  Component Value Date   WBC 11.4 (H) 07/01/2024   HGB 12.6 07/01/2024   HCT 38.2 07/01/2024   MCV 100 (H) 07/01/2024   MCH 33.1 (H) 07/01/2024   RDW 12.7 07/01/2024   PLT 346 07/01/2024   Last metabolic panel Lab Results  Component Value Date   GLUCOSE 87 07/01/2024   NA 142 07/01/2024   K 4.3 07/01/2024   CL 101 07/01/2024   CO2 24 07/01/2024   BUN 17 07/01/2024   CREATININE 0.85 07/01/2024   EGFR 67 07/01/2024   CALCIUM  9.2 07/01/2024   PROT 6.1 04/28/2023   ALBUMIN 4.0 03/29/2019   BILITOT 0.4 04/28/2023   ALKPHOS 48 03/29/2019   AST 13 04/28/2023   ALT 12 04/28/2023   ANIONGAP 9 03/29/2019   Last lipids Lab Results  Component Value Date   CHOL 196 06/04/2021   HDL 70 06/04/2021   LDLCALC 107 (H) 06/04/2021   TRIG 97 06/04/2021   CHOLHDL 2.8 06/04/2021   Last hemoglobin A1c No results found for: HGBA1C Last thyroid  functions Lab Results  Component Value Date   TSH 2.970 07/01/2024   Last vitamin D  Lab Results  Component Value Date  VD25OH 56.8 06/27/2016   Last vitamin B12 and Folate Lab Results  Component Value Date   VITAMINB12 657 04/28/2023   FOLATE 13.3 04/28/2023      The ASCVD Risk score (Arnett DK, et al., 2019) failed to calculate for the following reasons:   The 2019 ASCVD risk score is only valid for ages 78 to 58   * - Cholesterol units were assumed    Assessment & Plan:   Assessment & Plan  Rotator cuff tear of the shoulder Chronic tear with decreased range of motion and muscular pain. - Consult orthopedic specialist for further evaluation and management.  Fungal infection of nail (onychomycosis) Chronic infection with significant nail damage. Oral antifungal treatment more effective but requires liver function monitoring. - Ordered liver function  tests to assess suitability for oral antifungal treatment. - Consider oral Lamisil  (terbinafine ) if liver function normal, with monitoring every three months. - Discussed potential side effects including liver toxicity and platelet effects.  Leukocytosis Mild leukocytosis possibly due to inflammation from shoulder injury or other non-infectious causes. - Rechecked white blood cell count to monitor for changes. - Evaluated for potential sources of inflammation or infection.  Chronic obstructive pulmonary disease COPD managed with Breo inhaler. - Refilled Breo inhaler prescription.  Hyperlipidemia - Refill Zetia .   Lumbar Arthritis - Refill Cymbalta .   General health maintenance Flu shot received. Ongoing monitoring of chronic conditions. - Continue routine health maintenance and monitoring of chronic conditions.  - CBC w/Diff/Platelet - Comprehensive Metabolic Panel (CMET) - fluticasone  furoate-vilanterol (BREO ELLIPTA ) 200-25 MCG/ACT AEPB; Inhale 1 puff into the lungs daily.  Dispense: 180 each; Refill: 1 - ezetimibe  (ZETIA ) 10 MG tablet; Take 1 tablet (10 mg total) by mouth daily.  Dispense: 90 tablet; Refill: 0 - DULoxetine  (CYMBALTA ) 60 MG capsule; Take 1 capsule (60 mg total) by mouth daily.  Dispense: 90 capsule; Refill: 1  Return in about 6 months (around 01/29/2025).    Sharyle Fischer, DO "

## 2024-08-01 NOTE — Telephone Encounter (Signed)
 Requested Prescriptions  Refused Prescriptions Disp Refills   DULoxetine  (CYMBALTA ) 60 MG capsule [Pharmacy Med Name: DULOXETINE  HCL DR 60 MG CAP] 90 capsule 1    Sig: TAKE 1 CAPSULE BY MOUTH EVERY DAY     Psychiatry: Antidepressants - SNRI - duloxetine  Passed - 08/01/2024  3:10 PM      Passed - Cr in normal range and within 360 days    Creat  Date Value Ref Range Status  04/28/2023 0.63 0.60 - 0.95 mg/dL Final   Creatinine, Ser  Date Value Ref Range Status  07/01/2024 0.85 0.57 - 1.00 mg/dL Final         Passed - eGFR is 30 or above and within 360 days    GFR, Est African American  Date Value Ref Range Status  06/04/2020 96 > OR = 60 mL/min/1.93m2 Final   GFR, Est Non African American  Date Value Ref Range Status  06/04/2020 83 > OR = 60 mL/min/1.92m2 Final   eGFR  Date Value Ref Range Status  07/01/2024 67 >59 mL/min/1.73 Final         Passed - Completed PHQ-2 or PHQ-9 in the last 360 days      Passed - Last BP in normal range    BP Readings from Last 1 Encounters:  08/01/24 108/70         Passed - Valid encounter within last 6 months    Recent Outpatient Visits           Today Leukocytosis, unspecified type   [facility] Fend, DO   5 months ago Contusion of right knee, initial encounter   [facility] Leavy Mole, PA-C   6 months ago Contusion of right knee, initial encounter   [facility] Leavy Mole, PA-C   6 months ago [facility] Bernardo Fend, DO   6 months ago Multiple skin tears   [facility] Bernardo Fend, DO       Future Appointments             In 1 month [name], [name], NP [facility]   In 3 months Hester Alm BROCKS, MD [facility]

## 2024-08-02 ENCOUNTER — Other Ambulatory Visit: Payer: Self-pay | Admitting: Internal Medicine

## 2024-08-02 ENCOUNTER — Ambulatory Visit: Payer: Self-pay | Admitting: Internal Medicine

## 2024-08-02 DIAGNOSIS — B351 Tinea unguium: Secondary | ICD-10-CM

## 2024-08-02 LAB — COMPREHENSIVE METABOLIC PANEL WITH GFR
AG Ratio: 2 (calc) (ref 1.0–2.5)
ALT: 12 U/L (ref 6–29)
AST: 14 U/L (ref 10–35)
Albumin: 3.9 g/dL (ref 3.6–5.1)
Alkaline phosphatase (APISO): 55 U/L (ref 37–153)
BUN/Creatinine Ratio: 22 (calc) (ref 6–22)
BUN: 12 mg/dL (ref 7–25)
CO2: 33 mmol/L — ABNORMAL HIGH (ref 20–32)
Calcium: 9.4 mg/dL (ref 8.6–10.4)
Chloride: 103 mmol/L (ref 98–110)
Creat: 0.55 mg/dL — ABNORMAL LOW (ref 0.60–0.95)
Globulin: 2 g/dL (ref 1.9–3.7)
Glucose, Bld: 88 mg/dL (ref 65–99)
Potassium: 3.9 mmol/L (ref 3.5–5.3)
Sodium: 143 mmol/L (ref 135–146)
Total Bilirubin: 0.4 mg/dL (ref 0.2–1.2)
Total Protein: 5.9 g/dL — ABNORMAL LOW (ref 6.1–8.1)
eGFR: 89 mL/min/1.73m2

## 2024-08-02 LAB — CBC WITH DIFFERENTIAL/PLATELET
Absolute Lymphocytes: 2562 {cells}/uL (ref 850–3900)
Absolute Monocytes: 638 {cells}/uL (ref 200–950)
Basophils Absolute: 84 {cells}/uL (ref 0–200)
Basophils Relative: 1 %
Eosinophils Absolute: 101 {cells}/uL (ref 15–500)
Eosinophils Relative: 1.2 %
HCT: 36.9 % (ref 35.9–46.0)
Hemoglobin: 12 g/dL (ref 11.7–15.5)
MCH: 32.5 pg (ref 27.0–33.0)
MCHC: 32.5 g/dL (ref 31.6–35.4)
MCV: 100 fL (ref 81.4–101.7)
MPV: 10.1 fL (ref 7.5–12.5)
Monocytes Relative: 7.6 %
Neutro Abs: 5015 {cells}/uL (ref 1500–7800)
Neutrophils Relative %: 59.7 %
Platelets: 343 Thousand/uL (ref 140–400)
RBC: 3.69 Million/uL — ABNORMAL LOW (ref 3.80–5.10)
RDW: 13.1 % (ref 11.0–15.0)
Total Lymphocyte: 30.5 %
WBC: 8.4 Thousand/uL (ref 3.8–10.8)

## 2024-08-02 MED ORDER — TERBINAFINE HCL 250 MG PO TABS: 250.0000 mg | ORAL_TABLET | Freq: Every day | ORAL | 1 refills | Status: AC

## 2024-09-21 ENCOUNTER — Ambulatory Visit: Admitting: Nurse Practitioner

## 2024-11-01 ENCOUNTER — Ambulatory Visit (INDEPENDENT_AMBULATORY_CARE_PROVIDER_SITE_OTHER): Admitting: Vascular Surgery

## 2024-11-15 ENCOUNTER — Ambulatory Visit: Admitting: Dermatology

## 2024-12-01 ENCOUNTER — Ambulatory Visit

## 2025-01-30 ENCOUNTER — Ambulatory Visit: Admitting: Internal Medicine
